# Patient Record
Sex: Male | Born: 1965 | Race: White
Health system: Western US, Academic
[De-identification: ages and names within clinical notes are randomized; demographics above are authoritative.]

## PROBLEM LIST (undated history)

## (undated) DIAGNOSIS — R0600 Dyspnea, unspecified: Secondary | ICD-10-CM

## (undated) DIAGNOSIS — I77810 Thoracic aortic ectasia: Secondary | ICD-10-CM

## (undated) DIAGNOSIS — E785 Hyperlipidemia, unspecified: Secondary | ICD-10-CM

## (undated) DIAGNOSIS — I2510 Atherosclerotic heart disease of native coronary artery without angina pectoris: Secondary | ICD-10-CM

## (undated) DIAGNOSIS — Z9889 Other specified postprocedural states: Secondary | ICD-10-CM

## (undated) DIAGNOSIS — R011 Cardiac murmur, unspecified: Secondary | ICD-10-CM

## (undated) DIAGNOSIS — R51 Headache: Secondary | ICD-10-CM

## (undated) DIAGNOSIS — I214 Non-ST elevation (NSTEMI) myocardial infarction: Secondary | ICD-10-CM

## (undated) DIAGNOSIS — G4733 Obstructive sleep apnea (adult) (pediatric): Secondary | ICD-10-CM

## (undated) DIAGNOSIS — I509 Heart failure, unspecified: Secondary | ICD-10-CM

## (undated) DIAGNOSIS — F329 Major depressive disorder, single episode, unspecified: Secondary | ICD-10-CM

## (undated) DIAGNOSIS — R519 Headache, unspecified: Secondary | ICD-10-CM

## (undated) DIAGNOSIS — K219 Gastro-esophageal reflux disease without esophagitis: Secondary | ICD-10-CM

## (undated) DIAGNOSIS — G4730 Sleep apnea, unspecified: Secondary | ICD-10-CM

## (undated) DIAGNOSIS — I351 Nonrheumatic aortic (valve) insufficiency: Secondary | ICD-10-CM

## (undated) DIAGNOSIS — I10 Essential (primary) hypertension: Secondary | ICD-10-CM

## (undated) HISTORY — PX: CHOLESTEATOMA EXCISION: SHX1345

## (undated) HISTORY — PX: WISDOM TOOTH EXTRACTION: SHX21

## (undated) HISTORY — DX: Nonrheumatic aortic (valve) insufficiency: I35.1

## (undated) HISTORY — DX: Major depressive disorder, single episode, unspecified: F32.9

## (undated) HISTORY — DX: Atherosclerotic heart disease of native coronary artery without angina pectoris: I25.10

## (undated) HISTORY — DX: Sleep apnea, unspecified: G47.30

## (undated) HISTORY — PX: TYMPANOSTOMY TUBE PLACEMENT: SHX32

## (undated) HISTORY — DX: Hyperlipidemia, unspecified: E78.5

## (undated) HISTORY — DX: Gastro-esophageal reflux disease without esophagitis: K21.9

## (undated) HISTORY — DX: Thoracic aortic ectasia: I77.810

## (undated) HISTORY — DX: Cardiac murmur, unspecified: R01.1

---
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
## 2005-04-02 ENCOUNTER — Encounter (HOSPITAL_COMMUNITY): Payer: Commercial Managed Care - PPO | Admitting: Orthopaedic Surgery

## 2005-04-02 ENCOUNTER — Ambulatory Visit (EMERGENCY_DEPARTMENT_HOSPITAL): Payer: No Typology Code available for payment source

## 2005-04-07 ENCOUNTER — Encounter: Payer: No Typology Code available for payment source | Admitting: Orthopaedic Surgery

## 2005-04-07 ENCOUNTER — Telehealth: Payer: No Typology Code available for payment source

## 2005-04-08 ENCOUNTER — Ambulatory Visit: Payer: No Typology Code available for payment source

## 2005-04-08 ENCOUNTER — Encounter (HOSPITAL_COMMUNITY): Payer: No Typology Code available for payment source | Admitting: Orthopaedic Surgery

## 2005-04-21 ENCOUNTER — Encounter: Payer: No Typology Code available for payment source | Admitting: Orthopaedic Surgery

## 2005-05-03 ENCOUNTER — Encounter: Payer: No Typology Code available for payment source | Admitting: Surgical

## 2005-05-04 ENCOUNTER — Encounter (HOSPITAL_BASED_OUTPATIENT_CLINIC_OR_DEPARTMENT_OTHER): Payer: No Typology Code available for payment source | Admitting: Psychologist

## 2005-05-11 ENCOUNTER — Encounter (HOSPITAL_BASED_OUTPATIENT_CLINIC_OR_DEPARTMENT_OTHER): Payer: No Typology Code available for payment source | Admitting: Psychologist

## 2005-05-11 ENCOUNTER — Encounter (HOSPITAL_BASED_OUTPATIENT_CLINIC_OR_DEPARTMENT_OTHER): Payer: No Typology Code available for payment source | Admitting: Physician Assistant

## 2005-05-11 ENCOUNTER — Encounter (HOSPITAL_BASED_OUTPATIENT_CLINIC_OR_DEPARTMENT_OTHER): Payer: No Typology Code available for payment source | Admitting: Rehabilitative and Restorative Service Providers"

## 2005-05-12 ENCOUNTER — Ambulatory Visit: Payer: No Typology Code available for payment source | Admitting: Rehabilitative and Restorative Service Providers"

## 2005-05-12 ENCOUNTER — Encounter: Payer: No Typology Code available for payment source | Admitting: Orthopaedic Surgery

## 2005-05-18 ENCOUNTER — Encounter (HOSPITAL_BASED_OUTPATIENT_CLINIC_OR_DEPARTMENT_OTHER): Payer: No Typology Code available for payment source | Admitting: Psychologist

## 2005-05-19 ENCOUNTER — Encounter: Payer: No Typology Code available for payment source | Admitting: Rehabilitative and Restorative Service Providers"

## 2005-05-24 ENCOUNTER — Encounter: Payer: No Typology Code available for payment source | Admitting: Rehabilitative and Restorative Service Providers"

## 2005-05-25 ENCOUNTER — Encounter (HOSPITAL_BASED_OUTPATIENT_CLINIC_OR_DEPARTMENT_OTHER): Payer: No Typology Code available for payment source | Admitting: Rehabilitative and Restorative Service Providers"

## 2005-05-25 ENCOUNTER — Encounter (HOSPITAL_BASED_OUTPATIENT_CLINIC_OR_DEPARTMENT_OTHER): Payer: No Typology Code available for payment source | Admitting: Psychologist

## 2005-05-25 ENCOUNTER — Encounter (HOSPITAL_BASED_OUTPATIENT_CLINIC_OR_DEPARTMENT_OTHER): Payer: Commercial Managed Care - PPO

## 2005-05-27 ENCOUNTER — Encounter: Payer: No Typology Code available for payment source | Admitting: Rehabilitative and Restorative Service Providers"

## 2005-05-31 ENCOUNTER — Encounter: Payer: No Typology Code available for payment source | Admitting: Rehabilitative and Restorative Service Providers"

## 2005-06-01 ENCOUNTER — Encounter (HOSPITAL_BASED_OUTPATIENT_CLINIC_OR_DEPARTMENT_OTHER): Payer: No Typology Code available for payment source | Admitting: Psychologist

## 2005-06-02 ENCOUNTER — Encounter: Payer: No Typology Code available for payment source | Admitting: Rehabilitative and Restorative Service Providers"

## 2005-06-03 ENCOUNTER — Encounter (HOSPITAL_BASED_OUTPATIENT_CLINIC_OR_DEPARTMENT_OTHER): Payer: No Typology Code available for payment source | Admitting: Rehabilitative and Restorative Service Providers"

## 2005-06-08 ENCOUNTER — Encounter (HOSPITAL_BASED_OUTPATIENT_CLINIC_OR_DEPARTMENT_OTHER): Payer: No Typology Code available for payment source | Admitting: Psychologist

## 2005-06-08 ENCOUNTER — Encounter (HOSPITAL_BASED_OUTPATIENT_CLINIC_OR_DEPARTMENT_OTHER): Payer: No Typology Code available for payment source | Admitting: Physician Assistant

## 2005-06-08 ENCOUNTER — Encounter: Payer: No Typology Code available for payment source | Admitting: Rehabilitative and Restorative Service Providers"

## 2005-06-10 ENCOUNTER — Encounter (HOSPITAL_BASED_OUTPATIENT_CLINIC_OR_DEPARTMENT_OTHER): Payer: No Typology Code available for payment source

## 2005-06-10 ENCOUNTER — Encounter (HOSPITAL_BASED_OUTPATIENT_CLINIC_OR_DEPARTMENT_OTHER): Payer: Commercial Managed Care - PPO | Admitting: Rehabilitative and Restorative Service Providers"

## 2005-06-10 ENCOUNTER — Encounter (HOSPITAL_BASED_OUTPATIENT_CLINIC_OR_DEPARTMENT_OTHER): Payer: No Typology Code available for payment source | Admitting: Rehabilitative and Restorative Service Providers"

## 2005-06-10 ENCOUNTER — Encounter: Payer: No Typology Code available for payment source | Admitting: Rehabilitative and Restorative Service Providers"

## 2005-06-15 ENCOUNTER — Encounter (HOSPITAL_BASED_OUTPATIENT_CLINIC_OR_DEPARTMENT_OTHER): Payer: Commercial Managed Care - PPO | Admitting: Rehabilitative and Restorative Service Providers"

## 2005-06-15 ENCOUNTER — Encounter (HOSPITAL_BASED_OUTPATIENT_CLINIC_OR_DEPARTMENT_OTHER): Payer: No Typology Code available for payment source | Admitting: Psychologist

## 2005-06-15 ENCOUNTER — Encounter (HOSPITAL_BASED_OUTPATIENT_CLINIC_OR_DEPARTMENT_OTHER): Payer: Commercial Managed Care - PPO

## 2005-06-17 ENCOUNTER — Encounter (HOSPITAL_BASED_OUTPATIENT_CLINIC_OR_DEPARTMENT_OTHER): Payer: Commercial Managed Care - PPO

## 2005-06-22 ENCOUNTER — Encounter (HOSPITAL_BASED_OUTPATIENT_CLINIC_OR_DEPARTMENT_OTHER): Payer: No Typology Code available for payment source | Admitting: Psychologist

## 2005-06-22 ENCOUNTER — Encounter (HOSPITAL_BASED_OUTPATIENT_CLINIC_OR_DEPARTMENT_OTHER): Payer: No Typology Code available for payment source

## 2005-06-23 ENCOUNTER — Encounter: Payer: No Typology Code available for payment source | Admitting: Orthopaedic Surgery

## 2005-06-24 ENCOUNTER — Encounter (HOSPITAL_BASED_OUTPATIENT_CLINIC_OR_DEPARTMENT_OTHER): Payer: Commercial Managed Care - PPO

## 2005-06-29 ENCOUNTER — Encounter (HOSPITAL_BASED_OUTPATIENT_CLINIC_OR_DEPARTMENT_OTHER): Payer: No Typology Code available for payment source

## 2005-06-29 ENCOUNTER — Encounter (HOSPITAL_BASED_OUTPATIENT_CLINIC_OR_DEPARTMENT_OTHER): Payer: No Typology Code available for payment source | Admitting: Psychologist

## 2005-07-01 ENCOUNTER — Encounter (HOSPITAL_BASED_OUTPATIENT_CLINIC_OR_DEPARTMENT_OTHER): Payer: No Typology Code available for payment source

## 2005-07-06 ENCOUNTER — Encounter (HOSPITAL_BASED_OUTPATIENT_CLINIC_OR_DEPARTMENT_OTHER): Payer: No Typology Code available for payment source

## 2005-07-06 ENCOUNTER — Encounter (HOSPITAL_BASED_OUTPATIENT_CLINIC_OR_DEPARTMENT_OTHER): Payer: No Typology Code available for payment source | Admitting: Psychologist

## 2005-07-08 ENCOUNTER — Encounter (HOSPITAL_BASED_OUTPATIENT_CLINIC_OR_DEPARTMENT_OTHER): Payer: No Typology Code available for payment source

## 2005-07-13 ENCOUNTER — Encounter (HOSPITAL_BASED_OUTPATIENT_CLINIC_OR_DEPARTMENT_OTHER): Payer: Commercial Managed Care - PPO

## 2005-07-13 ENCOUNTER — Encounter (HOSPITAL_BASED_OUTPATIENT_CLINIC_OR_DEPARTMENT_OTHER): Payer: No Typology Code available for payment source | Admitting: Psychologist

## 2005-07-13 ENCOUNTER — Encounter (HOSPITAL_BASED_OUTPATIENT_CLINIC_OR_DEPARTMENT_OTHER): Payer: No Typology Code available for payment source | Admitting: Orthopaedic Surgery

## 2005-07-15 ENCOUNTER — Encounter (HOSPITAL_BASED_OUTPATIENT_CLINIC_OR_DEPARTMENT_OTHER): Payer: Self-pay

## 2005-07-20 ENCOUNTER — Encounter (HOSPITAL_BASED_OUTPATIENT_CLINIC_OR_DEPARTMENT_OTHER): Payer: No Typology Code available for payment source

## 2005-07-20 ENCOUNTER — Encounter (HOSPITAL_BASED_OUTPATIENT_CLINIC_OR_DEPARTMENT_OTHER): Payer: No Typology Code available for payment source | Admitting: Physician Assistant

## 2005-07-20 ENCOUNTER — Encounter (HOSPITAL_BASED_OUTPATIENT_CLINIC_OR_DEPARTMENT_OTHER): Payer: No Typology Code available for payment source | Admitting: Psychologist

## 2005-07-27 ENCOUNTER — Encounter (HOSPITAL_BASED_OUTPATIENT_CLINIC_OR_DEPARTMENT_OTHER): Payer: No Typology Code available for payment source

## 2005-07-27 ENCOUNTER — Encounter (HOSPITAL_BASED_OUTPATIENT_CLINIC_OR_DEPARTMENT_OTHER): Payer: Commercial Managed Care - PPO

## 2005-07-27 ENCOUNTER — Encounter (HOSPITAL_BASED_OUTPATIENT_CLINIC_OR_DEPARTMENT_OTHER): Payer: No Typology Code available for payment source | Admitting: Orthopaedic Surgery

## 2005-07-27 ENCOUNTER — Encounter (HOSPITAL_BASED_OUTPATIENT_CLINIC_OR_DEPARTMENT_OTHER): Payer: No Typology Code available for payment source | Admitting: Psychologist

## 2005-07-29 ENCOUNTER — Encounter (HOSPITAL_BASED_OUTPATIENT_CLINIC_OR_DEPARTMENT_OTHER): Payer: Commercial Managed Care - PPO

## 2005-08-03 ENCOUNTER — Encounter (HOSPITAL_BASED_OUTPATIENT_CLINIC_OR_DEPARTMENT_OTHER): Payer: No Typology Code available for payment source | Admitting: Psychologist

## 2005-08-04 ENCOUNTER — Encounter (HOSPITAL_BASED_OUTPATIENT_CLINIC_OR_DEPARTMENT_OTHER): Payer: No Typology Code available for payment source

## 2005-08-10 ENCOUNTER — Encounter (HOSPITAL_BASED_OUTPATIENT_CLINIC_OR_DEPARTMENT_OTHER): Payer: No Typology Code available for payment source

## 2005-08-10 ENCOUNTER — Encounter (HOSPITAL_BASED_OUTPATIENT_CLINIC_OR_DEPARTMENT_OTHER): Payer: No Typology Code available for payment source | Admitting: Psychologist

## 2005-08-10 ENCOUNTER — Encounter (HOSPITAL_BASED_OUTPATIENT_CLINIC_OR_DEPARTMENT_OTHER): Payer: No Typology Code available for payment source | Admitting: Orthopaedic Surgery

## 2005-08-13 ENCOUNTER — Encounter (HOSPITAL_BASED_OUTPATIENT_CLINIC_OR_DEPARTMENT_OTHER): Payer: No Typology Code available for payment source | Admitting: Rehabilitative and Restorative Service Providers"

## 2005-08-17 ENCOUNTER — Encounter (HOSPITAL_BASED_OUTPATIENT_CLINIC_OR_DEPARTMENT_OTHER): Payer: No Typology Code available for payment source | Admitting: Psychologist

## 2005-08-17 ENCOUNTER — Encounter (HOSPITAL_BASED_OUTPATIENT_CLINIC_OR_DEPARTMENT_OTHER): Payer: Self-pay

## 2005-08-17 ENCOUNTER — Encounter (HOSPITAL_BASED_OUTPATIENT_CLINIC_OR_DEPARTMENT_OTHER): Payer: Commercial Managed Care - PPO

## 2005-08-24 ENCOUNTER — Encounter (HOSPITAL_BASED_OUTPATIENT_CLINIC_OR_DEPARTMENT_OTHER): Payer: No Typology Code available for payment source

## 2005-08-24 ENCOUNTER — Encounter (HOSPITAL_BASED_OUTPATIENT_CLINIC_OR_DEPARTMENT_OTHER): Payer: Self-pay

## 2005-08-24 ENCOUNTER — Encounter (HOSPITAL_BASED_OUTPATIENT_CLINIC_OR_DEPARTMENT_OTHER): Payer: No Typology Code available for payment source | Admitting: Psychologist

## 2005-08-31 ENCOUNTER — Encounter (HOSPITAL_BASED_OUTPATIENT_CLINIC_OR_DEPARTMENT_OTHER): Payer: No Typology Code available for payment source | Admitting: Psychologist

## 2005-08-31 ENCOUNTER — Encounter (HOSPITAL_BASED_OUTPATIENT_CLINIC_OR_DEPARTMENT_OTHER): Payer: Commercial Managed Care - PPO | Admitting: Physician Assistant

## 2005-09-01 ENCOUNTER — Encounter (HOSPITAL_BASED_OUTPATIENT_CLINIC_OR_DEPARTMENT_OTHER): Payer: Self-pay

## 2005-09-07 ENCOUNTER — Encounter (HOSPITAL_BASED_OUTPATIENT_CLINIC_OR_DEPARTMENT_OTHER): Payer: No Typology Code available for payment source | Admitting: Physical Medicine & Rehabilitation

## 2005-09-07 ENCOUNTER — Encounter (HOSPITAL_BASED_OUTPATIENT_CLINIC_OR_DEPARTMENT_OTHER): Payer: No Typology Code available for payment source | Admitting: Psychologist

## 2005-09-07 ENCOUNTER — Encounter (HOSPITAL_BASED_OUTPATIENT_CLINIC_OR_DEPARTMENT_OTHER): Payer: No Typology Code available for payment source

## 2005-09-14 ENCOUNTER — Encounter (HOSPITAL_BASED_OUTPATIENT_CLINIC_OR_DEPARTMENT_OTHER): Payer: No Typology Code available for payment source | Admitting: Psychologist

## 2005-09-14 ENCOUNTER — Encounter (HOSPITAL_BASED_OUTPATIENT_CLINIC_OR_DEPARTMENT_OTHER): Payer: Commercial Managed Care - PPO

## 2005-09-21 ENCOUNTER — Encounter (HOSPITAL_BASED_OUTPATIENT_CLINIC_OR_DEPARTMENT_OTHER): Payer: No Typology Code available for payment source | Admitting: Psychologist

## 2005-10-19 ENCOUNTER — Encounter (HOSPITAL_BASED_OUTPATIENT_CLINIC_OR_DEPARTMENT_OTHER): Payer: No Typology Code available for payment source | Admitting: Physician Assistant

## 2005-10-19 ENCOUNTER — Encounter (HOSPITAL_BASED_OUTPATIENT_CLINIC_OR_DEPARTMENT_OTHER): Payer: No Typology Code available for payment source | Admitting: Rehabilitative and Restorative Service Providers"

## 2005-11-09 ENCOUNTER — Encounter (HOSPITAL_BASED_OUTPATIENT_CLINIC_OR_DEPARTMENT_OTHER): Payer: No Typology Code available for payment source | Admitting: Physical Medicine & Rehabilitation

## 2005-11-09 ENCOUNTER — Encounter (HOSPITAL_BASED_OUTPATIENT_CLINIC_OR_DEPARTMENT_OTHER): Payer: Commercial Managed Care - PPO | Admitting: Rehabilitative and Restorative Service Providers"

## 2005-11-23 ENCOUNTER — Encounter (HOSPITAL_BASED_OUTPATIENT_CLINIC_OR_DEPARTMENT_OTHER): Payer: Commercial Managed Care - PPO | Admitting: Rehabilitative and Restorative Service Providers"

## 2005-11-30 ENCOUNTER — Encounter (HOSPITAL_BASED_OUTPATIENT_CLINIC_OR_DEPARTMENT_OTHER): Payer: Commercial Managed Care - PPO | Admitting: Rehabilitative and Restorative Service Providers"

## 2005-12-07 ENCOUNTER — Encounter (HOSPITAL_BASED_OUTPATIENT_CLINIC_OR_DEPARTMENT_OTHER): Payer: Commercial Managed Care - PPO | Admitting: Rehabilitative and Restorative Service Providers"

## 2005-12-14 ENCOUNTER — Encounter (HOSPITAL_BASED_OUTPATIENT_CLINIC_OR_DEPARTMENT_OTHER): Payer: Commercial Managed Care - PPO | Admitting: Rehabilitative and Restorative Service Providers"

## 2005-12-21 ENCOUNTER — Encounter (HOSPITAL_BASED_OUTPATIENT_CLINIC_OR_DEPARTMENT_OTHER): Payer: Commercial Managed Care - PPO | Admitting: Rehabilitative and Restorative Service Providers"

## 2005-12-28 ENCOUNTER — Encounter (HOSPITAL_BASED_OUTPATIENT_CLINIC_OR_DEPARTMENT_OTHER): Payer: No Typology Code available for payment source | Admitting: Physical Medicine & Rehabilitation

## 2005-12-28 ENCOUNTER — Encounter (HOSPITAL_BASED_OUTPATIENT_CLINIC_OR_DEPARTMENT_OTHER): Payer: Commercial Managed Care - PPO | Admitting: Rehabilitative and Restorative Service Providers"

## 2005-12-29 ENCOUNTER — Encounter (HOSPITAL_BASED_OUTPATIENT_CLINIC_OR_DEPARTMENT_OTHER): Payer: Commercial Managed Care - PPO | Admitting: Rehabilitative and Restorative Service Providers"

## 2006-01-04 ENCOUNTER — Encounter (HOSPITAL_BASED_OUTPATIENT_CLINIC_OR_DEPARTMENT_OTHER): Payer: No Typology Code available for payment source | Admitting: Rehabilitative and Restorative Service Providers"

## 2006-01-05 ENCOUNTER — Encounter (HOSPITAL_BASED_OUTPATIENT_CLINIC_OR_DEPARTMENT_OTHER): Payer: No Typology Code available for payment source | Admitting: Clinical

## 2006-01-11 ENCOUNTER — Encounter (HOSPITAL_BASED_OUTPATIENT_CLINIC_OR_DEPARTMENT_OTHER): Payer: No Typology Code available for payment source | Admitting: Rehabilitative and Restorative Service Providers"

## 2006-01-18 ENCOUNTER — Encounter (HOSPITAL_BASED_OUTPATIENT_CLINIC_OR_DEPARTMENT_OTHER): Payer: No Typology Code available for payment source | Admitting: Rehabilitative and Restorative Service Providers"

## 2006-01-18 ENCOUNTER — Encounter (HOSPITAL_BASED_OUTPATIENT_CLINIC_OR_DEPARTMENT_OTHER): Payer: No Typology Code available for payment source | Admitting: Clinical

## 2006-01-18 ENCOUNTER — Encounter (HOSPITAL_BASED_OUTPATIENT_CLINIC_OR_DEPARTMENT_OTHER): Payer: Commercial Managed Care - PPO | Admitting: Rehabilitative and Restorative Service Providers"

## 2006-01-26 ENCOUNTER — Encounter (HOSPITAL_BASED_OUTPATIENT_CLINIC_OR_DEPARTMENT_OTHER): Payer: No Typology Code available for payment source | Admitting: Clinical

## 2006-02-02 ENCOUNTER — Encounter (HOSPITAL_BASED_OUTPATIENT_CLINIC_OR_DEPARTMENT_OTHER): Payer: No Typology Code available for payment source | Admitting: Clinical

## 2006-02-09 ENCOUNTER — Encounter (HOSPITAL_BASED_OUTPATIENT_CLINIC_OR_DEPARTMENT_OTHER): Payer: No Typology Code available for payment source | Admitting: Clinical

## 2006-02-16 ENCOUNTER — Encounter (HOSPITAL_BASED_OUTPATIENT_CLINIC_OR_DEPARTMENT_OTHER): Payer: No Typology Code available for payment source | Admitting: Clinical

## 2006-02-23 ENCOUNTER — Encounter (HOSPITAL_BASED_OUTPATIENT_CLINIC_OR_DEPARTMENT_OTHER): Payer: Commercial Managed Care - PPO | Admitting: Clinical

## 2006-03-08 ENCOUNTER — Encounter (HOSPITAL_BASED_OUTPATIENT_CLINIC_OR_DEPARTMENT_OTHER): Payer: No Typology Code available for payment source | Admitting: Physical Medicine & Rehabilitation

## 2006-03-08 ENCOUNTER — Encounter (HOSPITAL_BASED_OUTPATIENT_CLINIC_OR_DEPARTMENT_OTHER): Payer: PRIVATE HEALTH INSURANCE

## 2006-06-14 ENCOUNTER — Encounter (HOSPITAL_BASED_OUTPATIENT_CLINIC_OR_DEPARTMENT_OTHER): Payer: No Typology Code available for payment source | Admitting: Physical Medicine & Rehabilitation

## 2006-12-13 ENCOUNTER — Encounter (HOSPITAL_BASED_OUTPATIENT_CLINIC_OR_DEPARTMENT_OTHER): Payer: No Typology Code available for payment source | Admitting: Physical Medicine & Rehabilitation

## 2007-01-24 ENCOUNTER — Encounter (HOSPITAL_BASED_OUTPATIENT_CLINIC_OR_DEPARTMENT_OTHER): Payer: No Typology Code available for payment source | Admitting: Rehabilitative and Restorative Service Providers"

## 2007-01-31 ENCOUNTER — Encounter (HOSPITAL_BASED_OUTPATIENT_CLINIC_OR_DEPARTMENT_OTHER): Payer: No Typology Code available for payment source | Admitting: Rehabilitative and Restorative Service Providers"

## 2007-02-03 ENCOUNTER — Encounter (HOSPITAL_BASED_OUTPATIENT_CLINIC_OR_DEPARTMENT_OTHER): Payer: No Typology Code available for payment source | Admitting: Rehabilitative and Restorative Service Providers"

## 2007-02-14 ENCOUNTER — Encounter (HOSPITAL_BASED_OUTPATIENT_CLINIC_OR_DEPARTMENT_OTHER): Payer: No Typology Code available for payment source | Admitting: Rehabilitative and Restorative Service Providers"

## 2007-02-17 ENCOUNTER — Encounter (HOSPITAL_BASED_OUTPATIENT_CLINIC_OR_DEPARTMENT_OTHER): Payer: No Typology Code available for payment source | Admitting: Rehabilitative and Restorative Service Providers"

## 2007-02-21 ENCOUNTER — Encounter (HOSPITAL_BASED_OUTPATIENT_CLINIC_OR_DEPARTMENT_OTHER): Payer: No Typology Code available for payment source | Admitting: Rehabilitative and Restorative Service Providers"

## 2007-02-28 ENCOUNTER — Encounter (HOSPITAL_BASED_OUTPATIENT_CLINIC_OR_DEPARTMENT_OTHER): Payer: PRIVATE HEALTH INSURANCE | Admitting: Rehabilitative and Restorative Service Providers"

## 2007-03-03 ENCOUNTER — Encounter (HOSPITAL_BASED_OUTPATIENT_CLINIC_OR_DEPARTMENT_OTHER): Payer: PRIVATE HEALTH INSURANCE | Admitting: Rehabilitative and Restorative Service Providers"

## 2007-03-07 ENCOUNTER — Encounter (HOSPITAL_BASED_OUTPATIENT_CLINIC_OR_DEPARTMENT_OTHER): Payer: PRIVATE HEALTH INSURANCE | Admitting: Rehabilitative and Restorative Service Providers"

## 2007-03-10 ENCOUNTER — Encounter (HOSPITAL_BASED_OUTPATIENT_CLINIC_OR_DEPARTMENT_OTHER): Payer: PRIVATE HEALTH INSURANCE | Admitting: Rehabilitative and Restorative Service Providers"

## 2007-03-15 ENCOUNTER — Encounter (HOSPITAL_BASED_OUTPATIENT_CLINIC_OR_DEPARTMENT_OTHER): Payer: No Typology Code available for payment source | Admitting: Rehabilitative and Restorative Service Providers"

## 2007-03-16 ENCOUNTER — Encounter (HOSPITAL_BASED_OUTPATIENT_CLINIC_OR_DEPARTMENT_OTHER): Payer: PRIVATE HEALTH INSURANCE | Admitting: Rehabilitative and Restorative Service Providers"

## 2007-03-30 HISTORY — PX: ARM AMPUTATION THROUGH FOREARM: SHX553

## 2007-05-30 ENCOUNTER — Encounter (HOSPITAL_BASED_OUTPATIENT_CLINIC_OR_DEPARTMENT_OTHER): Payer: No Typology Code available for payment source | Admitting: Physical Med & Rehab

## 2007-06-16 ENCOUNTER — Encounter (HOSPITAL_BASED_OUTPATIENT_CLINIC_OR_DEPARTMENT_OTHER): Payer: PRIVATE HEALTH INSURANCE | Admitting: Rehabilitative and Restorative Service Providers"

## 2007-06-23 ENCOUNTER — Encounter (HOSPITAL_BASED_OUTPATIENT_CLINIC_OR_DEPARTMENT_OTHER): Payer: PRIVATE HEALTH INSURANCE | Admitting: Rehabilitative and Restorative Service Providers"

## 2007-06-27 ENCOUNTER — Encounter (HOSPITAL_BASED_OUTPATIENT_CLINIC_OR_DEPARTMENT_OTHER): Payer: No Typology Code available for payment source | Admitting: Orthopaedic Surgery

## 2007-06-30 ENCOUNTER — Encounter (HOSPITAL_BASED_OUTPATIENT_CLINIC_OR_DEPARTMENT_OTHER): Payer: No Typology Code available for payment source | Admitting: Rehabilitative and Restorative Service Providers"

## 2007-07-07 ENCOUNTER — Encounter (HOSPITAL_BASED_OUTPATIENT_CLINIC_OR_DEPARTMENT_OTHER): Payer: No Typology Code available for payment source | Admitting: Rehabilitative and Restorative Service Providers"

## 2007-07-14 ENCOUNTER — Encounter (HOSPITAL_BASED_OUTPATIENT_CLINIC_OR_DEPARTMENT_OTHER): Payer: No Typology Code available for payment source | Admitting: Rehabilitative and Restorative Service Providers"

## 2007-07-18 ENCOUNTER — Encounter (HOSPITAL_BASED_OUTPATIENT_CLINIC_OR_DEPARTMENT_OTHER): Payer: No Typology Code available for payment source | Admitting: Rehabilitative and Restorative Service Providers"

## 2007-07-25 ENCOUNTER — Encounter (HOSPITAL_BASED_OUTPATIENT_CLINIC_OR_DEPARTMENT_OTHER): Payer: No Typology Code available for payment source | Admitting: Rehabilitative and Restorative Service Providers"

## 2007-08-01 ENCOUNTER — Encounter (HOSPITAL_BASED_OUTPATIENT_CLINIC_OR_DEPARTMENT_OTHER): Payer: No Typology Code available for payment source | Admitting: Rehabilitative and Restorative Service Providers"

## 2007-09-19 ENCOUNTER — Encounter (HOSPITAL_BASED_OUTPATIENT_CLINIC_OR_DEPARTMENT_OTHER): Payer: PRIVATE HEALTH INSURANCE | Admitting: Orthopaedic Surgery

## 2007-09-19 ENCOUNTER — Encounter (HOSPITAL_BASED_OUTPATIENT_CLINIC_OR_DEPARTMENT_OTHER): Payer: No Typology Code available for payment source | Admitting: Physical Medicine & Rehabilitation

## 2007-10-12 ENCOUNTER — Encounter (HOSPITAL_BASED_OUTPATIENT_CLINIC_OR_DEPARTMENT_OTHER): Payer: PRIVATE HEALTH INSURANCE | Admitting: Rehabilitative and Restorative Service Providers"

## 2007-10-18 ENCOUNTER — Encounter (HOSPITAL_BASED_OUTPATIENT_CLINIC_OR_DEPARTMENT_OTHER): Payer: PRIVATE HEALTH INSURANCE | Admitting: Rehabilitative and Restorative Service Providers"

## 2007-10-25 ENCOUNTER — Encounter (HOSPITAL_BASED_OUTPATIENT_CLINIC_OR_DEPARTMENT_OTHER): Payer: PRIVATE HEALTH INSURANCE | Admitting: Rehabilitative and Restorative Service Providers"

## 2007-11-01 ENCOUNTER — Encounter (HOSPITAL_BASED_OUTPATIENT_CLINIC_OR_DEPARTMENT_OTHER): Payer: PRIVATE HEALTH INSURANCE | Admitting: Rehabilitative and Restorative Service Providers"

## 2007-11-08 ENCOUNTER — Encounter (HOSPITAL_BASED_OUTPATIENT_CLINIC_OR_DEPARTMENT_OTHER): Payer: No Typology Code available for payment source | Admitting: Rehabilitative and Restorative Service Providers"

## 2008-02-27 ENCOUNTER — Encounter (HOSPITAL_BASED_OUTPATIENT_CLINIC_OR_DEPARTMENT_OTHER): Payer: No Typology Code available for payment source | Admitting: Physical Medicine & Rehabilitation

## 2008-06-04 ENCOUNTER — Encounter (HOSPITAL_BASED_OUTPATIENT_CLINIC_OR_DEPARTMENT_OTHER): Payer: PRIVATE HEALTH INSURANCE | Admitting: Physical Medicine & Rehabilitation

## 2008-06-28 ENCOUNTER — Ambulatory Visit (HOSPITAL_BASED_OUTPATIENT_CLINIC_OR_DEPARTMENT_OTHER): Payer: Self-pay

## 2008-12-31 ENCOUNTER — Ambulatory Visit (HOSPITAL_BASED_OUTPATIENT_CLINIC_OR_DEPARTMENT_OTHER): Payer: Self-pay

## 2009-01-28 ENCOUNTER — Ambulatory Visit (HOSPITAL_BASED_OUTPATIENT_CLINIC_OR_DEPARTMENT_OTHER): Payer: Self-pay

## 2009-05-27 ENCOUNTER — Ambulatory Visit (HOSPITAL_BASED_OUTPATIENT_CLINIC_OR_DEPARTMENT_OTHER)
Payer: No Typology Code available for payment source | Attending: Physical Medicine & Rehabilitation | Admitting: Physical Medicine & Rehabilitation

## 2009-05-27 DIAGNOSIS — B369 Superficial mycosis, unspecified: Secondary | ICD-10-CM | POA: Insufficient documentation

## 2009-05-27 DIAGNOSIS — M25519 Pain in unspecified shoulder: Secondary | ICD-10-CM | POA: Insufficient documentation

## 2010-01-20 ENCOUNTER — Ambulatory Visit (HOSPITAL_BASED_OUTPATIENT_CLINIC_OR_DEPARTMENT_OTHER): Payer: Self-pay

## 2011-02-17 ENCOUNTER — Ambulatory Visit (HOSPITAL_BASED_OUTPATIENT_CLINIC_OR_DEPARTMENT_OTHER): Payer: Self-pay

## 2012-02-08 ENCOUNTER — Ambulatory Visit (HOSPITAL_BASED_OUTPATIENT_CLINIC_OR_DEPARTMENT_OTHER): Payer: Self-pay

## 2014-01-21 ENCOUNTER — Telehealth (HOSPITAL_BASED_OUTPATIENT_CLINIC_OR_DEPARTMENT_OTHER): Payer: Self-pay | Admitting: Physical Medicine & Rehabilitation

## 2014-01-21 NOTE — Telephone Encounter (Signed)
Call and spoke with patient and he was wanting to get a prescription for a prosthetic.  However there are issues.    1.  He lives in [place] now and will need to establish care with a provider to get a script.   He was appreciative of the call back, but wanted to try anyway.

## 2014-01-21 NOTE — Telephone Encounter (Signed)
CONFIRMED PHONE NUMBER: [phone]  CALLERS FIRST AND LAST NAME: Angela NevinKenneth Facemire  FACILITY NAME: n/a TITLE: n/  CALLERS RELATIONSHIP:Self  RETURN CALL: OK to leave detailed message with his wife     SUBJECT: General Message   REASON FOR REQUEST: Patient from 2011 requesting a prosthetic device order    MESSAGE: The patient needs a replacement wrist for his otto bock sensor hand. He was wondering if the doctor would be able to write the prescription. He would like to work with [facility] of [facility]. Their fax is [phone].    Please call the patient back to let him know if you can help him or not. Thank you.

## 2014-02-02 ENCOUNTER — Ambulatory Visit (INDEPENDENT_AMBULATORY_CARE_PROVIDER_SITE_OTHER): Payer: 59 | Admitting: Emergency Medicine

## 2014-02-02 ENCOUNTER — Ambulatory Visit (INDEPENDENT_AMBULATORY_CARE_PROVIDER_SITE_OTHER): Payer: 59

## 2014-02-02 VITALS — BP 132/86 | HR 74 | Temp 98.3°F | Resp 16 | Ht 71.25 in | Wt 188.4 lb

## 2014-02-02 DIAGNOSIS — S20211A Contusion of right front wall of thorax, initial encounter: Secondary | ICD-10-CM

## 2014-02-02 LAB — POCT CBC
GRANULOCYTE PERCENT: 70 % (ref 37–80)
HCT, POC: 47.9 % (ref 43.5–53.7)
Hemoglobin: 15.8 g/dL (ref 14.1–18.1)
Lymph, poc: 2.1 (ref 0.6–3.4)
MCH, POC: 32.4 pg — AB (ref 27–31.2)
MCHC: 33 g/dL (ref 31.8–35.4)
MCV: 98.2 fL — AB (ref 80–97)
MID (CBC): 0.3 (ref 0–0.9)
MPV: 6.5 fL (ref 0–99.8)
POC GRANULOCYTE: 5.5 (ref 2–6.9)
POC LYMPH %: 26.8 % (ref 10–50)
POC MID %: 3.2 % (ref 0–12)
Platelet Count, POC: 257 10*3/uL (ref 142–424)
RBC: 4.88 M/uL (ref 4.69–6.13)
RDW, POC: 13.3 %
WBC: 7.9 10*3/uL (ref 4.6–10.2)

## 2014-02-02 LAB — POCT UA - MICROSCOPIC ONLY
Bacteria, U Microscopic: NEGATIVE
CRYSTALS, UR, HPF, POC: NEGATIVE
Casts, Ur, LPF, POC: NEGATIVE
EPITHELIAL CELLS, URINE PER MICROSCOPY: NEGATIVE
MUCUS UA: NEGATIVE
WBC, Ur, HPF, POC: NEGATIVE
Yeast, UA: NEGATIVE

## 2014-02-02 LAB — POCT URINALYSIS DIPSTICK
Bilirubin, UA: NEGATIVE
Glucose, UA: NEGATIVE
KETONES UA: NEGATIVE
Leukocytes, UA: NEGATIVE
Nitrite, UA: NEGATIVE
PH UA: 7
PROTEIN UA: NEGATIVE
RBC UA: NEGATIVE
Spec Grav, UA: 1.02
Urobilinogen, UA: 0.2

## 2014-02-02 MED ORDER — ACETAMINOPHEN-CODEINE #3 300-30 MG PO TABS: 1.0000 | ORAL_TABLET | ORAL | Status: DC | PRN

## 2014-02-02 NOTE — Patient Instructions (Signed)

## 2014-02-02 NOTE — Progress Notes (Signed)
[facility] [address] [place] [address] [phone]  Date:  02/02/2014   Name:  Isaac Patel   DOB:  1965/09/20   MRN:  [id number]  PCP:  No PCP Per Patient    Chief Complaint: Back Pain; Establish Care; and Prescription   History of Present Illness:  Isaac Patel is a 48 y.o. very pleasant male patient who presents with the following:  Injured when he slipped on a stairway two weeks ago and landed on his right back.   No neuro symptoms.   No radiation of pain No shortness of breath, nausea or vomiting, coughing blood  No blood in urine. No stool change.  No blood in stools.   No improvement with over the counter medications or other home remedies.  Denies other complaint or health concern today.   There are no active problems to display for this patient.   Past Medical History  Diagnosis Date  . Depression   . GERD (gastroesophageal reflux disease)     Past Surgical History  Procedure Laterality Date  . Amputation  Left     History  Substance Use Topics  . Smoking status: Current Every Day Smoker -- 1.00 packs/day for 30 years    Types: Cigarettes  . Smokeless tobacco: Not on file  . Alcohol Use: 1.2 - 1.8 oz/week    2-3 Not specified per week    History reviewed. No pertinent family history.  No Known Allergies  Medication list has been reviewed and updated.  No current outpatient prescriptions on file prior to visit.   No current facility-administered medications on file prior to visit.    Review of Systems:  As per HPI, otherwise negative.    Physical Examination: Filed Vitals:   02/02/14 1358  BP: 132/86  Pulse: 74  Temp: 98.3 F (36.8 C)  Resp: 16   Filed Vitals:   02/02/14 1358  Height: 5' 11.25" (1.81 m)  Weight: 188 lb 6.6 oz (85.462 kg)   Body mass index is 26.09 kg/(m^2). Ideal Body Weight: Weight in (lb) to have BMI = 25: 180.1  GEN: WDWN, NAD, Non-toxic, A & O x 3 HEENT: Atraumatic,  Normocephalic. Neck supple. No masses, No LAD. Ears and Nose: No external deformity. CV: RRR, No M/G/R. No JVD. No thrill. No extra heart sounds. PULM: CTA B, no wheezes, crackles, rhonchi. No retractions. No resp. distress. No accessory muscle use. Tender right posterior chest wall. No crepitus or flail. No ecchymosis ABD: S, NT, ND, +BS. No rebound. No HSM. EXTR: No c/c/e  Left forearm amputation NEURO Normal gait.  PSYCH: Normally interactive. Conversant. Not depressed or anxious appearing.  Calm demeanor.    Assessment and Plan: Contusion chest wall tyl #3  Signed,  Ellison Carwin, MD   Results for orders placed or performed in visit on 02/02/14  POCT UA - Microscopic Only  Result Value Ref Range   WBC, Ur, HPF, POC neg    RBC, urine, microscopic 0-1    Bacteria, U Microscopic neg    Mucus, UA neg    Epithelial cells, urine per micros neg    Crystals, Ur, HPF, POC neg    Casts, Ur, LPF, POC neg    Yeast, UA neg   POCT urinalysis dipstick  Result Value Ref Range   Color, UA dark yellow    Clarity, UA clear    Glucose, UA neg    Bilirubin, UA neg    Ketones, UA neg  Spec Grav, UA 1.020    Blood, UA neg    pH, UA 7.0    Protein, UA neg    Urobilinogen, UA 0.2    Nitrite, UA neg    Leukocytes, UA Negative   POCT CBC  Result Value Ref Range   WBC 7.9 4.6 - 10.2 K/uL   Lymph, poc 2.1 0.6 - 3.4   POC LYMPH PERCENT 26.8 10 - 50 %L   MID (cbc) 0.3 0 - 0.9   POC MID % 3.2 0 - 12 %M   POC Granulocyte 5.5 2 - 6.9   Granulocyte percent 70.0 37 - 80 %G   RBC 4.88 4.69 - 6.13 M/uL   Hemoglobin 15.8 14.1 - 18.1 g/dL   HCT, POC 47.9 43.5 - 53.7 %   MCV 98.2 (A) 80 - 97 fL   MCH, POC 32.4 (A) 27 - 31.2 pg   MCHC 33.0 31.8 - 35.4 g/dL   RDW, POC 13.3 %   Platelet Count, POC 257 142 - 424 K/uL   MPV 6.5 0 - 99.8 fL     [facility] reading (PRIMARY) by  Dr. Ouida Sills.  No pneumo or hemothorax.

## 2014-05-08 ENCOUNTER — Encounter: Payer: Self-pay | Admitting: Family

## 2014-05-08 ENCOUNTER — Ambulatory Visit (INDEPENDENT_AMBULATORY_CARE_PROVIDER_SITE_OTHER): Payer: 59 | Admitting: Family

## 2014-05-08 VITALS — BP 132/92 | HR 61 | Temp 98.0°F | Resp 18 | Ht 71.0 in | Wt 190.8 lb

## 2014-05-08 DIAGNOSIS — H7190 Unspecified cholesteatoma, unspecified ear: Secondary | ICD-10-CM | POA: Insufficient documentation

## 2014-05-08 DIAGNOSIS — H7191 Unspecified cholesteatoma, right ear: Secondary | ICD-10-CM

## 2014-05-08 DIAGNOSIS — G4733 Obstructive sleep apnea (adult) (pediatric): Secondary | ICD-10-CM | POA: Insufficient documentation

## 2014-05-08 DIAGNOSIS — G4730 Sleep apnea, unspecified: Secondary | ICD-10-CM

## 2014-05-08 MED ORDER — PANTOPRAZOLE SODIUM 40 MG PO TBEC: 40.0000 mg | DELAYED_RELEASE_TABLET | Freq: Every day | ORAL | Status: DC

## 2014-05-08 NOTE — Assessment & Plan Note (Signed)
Previously diagnosed approximately 2 years ago in [place]. With current equipment not working, refer to pulmonology and sleep medicine for further management.

## 2014-05-08 NOTE — Progress Notes (Signed)
Pre visit review using our clinic review tool, if applicable. No additional management support is needed unless otherwise documented below in the visit note. 

## 2014-05-08 NOTE — Patient Instructions (Addendum)
Thank you for choosing [facility].  Summary/Instructions:  Please schedule a time for your physical.  Your prescription(s) have been submitted to your pharmacy or been printed and provided for you. Please take as directed and contact our office if you believe you are having problem(s) with the medication(s) or have any questions.  Referrals have been made during this visit. You should expect to hear back from our schedulers in about 10-14 days in regards to establishing an appointment with the specialists we discussed.   If your symptoms worsen or fail to improve, please contact our office for further instruction, or in case of emergency go directly to the emergency room at the closest medical facility.

## 2014-05-08 NOTE — Assessment & Plan Note (Signed)
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 Currently blocked by cerumen. Patient requesting referral to ear nose and throat for further evaluation and potential for reconstructive surgery.

## 2014-05-08 NOTE — Progress Notes (Signed)
Subjective:    [name], male    DOB: [birth year], 49 y.o.   MRN: [id number]  Chief Complaint  Patient presents with  . Establish Care    has sleep apnea needs parts for his machine at home, would like to talk about some referrals    HPI:  Isaac Patel is a 49 y.o. male who presents today to establish care and discuss his sleep apnea.  1) Apnea - Associated symptoms snoring and daytime fatigue has been going on for chronically. Was diagnosed a couple of years ago in [place]. Last sleep study was performed in 2014 confirming the sleep apnea. Indicates that he has been using a BiPAP machine and the mask does not fit very well that has some leaks which did help with some of the symptoms. Would like to have a referral to sleep medicine.  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 Lately, he has experienced the associated symptom of ncreased itchiness. There are no current treatments being performed at this time. Pain is 0/10.    No Known Allergies   No current outpatient prescriptions on file prior to visit.   No current facility-administered medications on file prior to visit.    Past Medical History  Diagnosis Date  . Depression   . GERD (gastroesophageal reflux disease)   . Sleep apnea     Past Surgical History  Procedure Laterality Date  . Amputation  Left   . Tympanostomy tube placement      Family History  Problem Relation Age of Onset  . Healthy Mother   . Healthy Father   . Multiple sclerosis Maternal Grandfather     History   Social History  . Marital Status: Married    Spouse Name: N/A  . Number of Children: 2  . Years of Education: 14   Occupational History  . CMA    Social History Main Topics  . Smoking status: Current Every Day Smoker -- 1.00 packs/day for 30 years    Types: Cigarettes  . Smokeless tobacco:  Never Used  . Alcohol Use: 3.6 - 4.8 oz/week    2-3 Standard drinks or equivalent, 4-5 Cans of beer per week  . Drug Use: No  . Sexual Activity: Not on file   Other Topics Concern  . Not on file   Social History Narrative   Born and raised in [place], [place].  Currently resides in a house with his wife and children. 1 dog. Fun: play computer games, sports.    Denies any religious beliefs effecting health care.    Left BEA     Review of Systems  Constitutional: Positive for fatigue. Negative for fever and chills.  HENT: Negative for ear discharge and ear pain.   Respiratory: Negative for chest tightness and shortness of breath.   Psychiatric/Behavioral: Positive for sleep disturbance.      Objective:    BP 132/92 mmHg  Pulse 61  Temp(Src) 98 F (36.7 C) (Oral)  Resp 18  Ht 5\' 11"  (1.803 m)  Wt 190 lb 12.8 oz (86.546 kg)  BMI 26.62 kg/m2  SpO2 99% Nursing note and vital signs reviewed.  Physical Exam  Constitutional: He is oriented to person, place, and time. He appears well-developed and well-nourished. No distress.  HENT:  Right Ear: Hearing and external ear normal.  Left Ear: Hearing and external ear normal.  Nose:  Nose normal.  Mouth/Throat: Uvula is midline, oropharynx is clear and moist and mucous membranes are normal.  Cerumen noted bilaterally obstructing view of TM.   Cardiovascular: Normal rate, regular rhythm, normal heart sounds and intact distal pulses.   Pulmonary/Chest: Effort normal and breath sounds normal.  Musculoskeletal:  Left below elbow amputation noted.   Neurological: He is alert and oriented to person, place, and time.  Skin: Skin is warm and dry.  Psychiatric: He has a normal mood and affect. His behavior is normal. Judgment and thought content normal.       Assessment & Plan:

## 2014-05-29 ENCOUNTER — Encounter: Payer: Self-pay | Admitting: Family

## 2014-05-29 ENCOUNTER — Ambulatory Visit (INDEPENDENT_AMBULATORY_CARE_PROVIDER_SITE_OTHER): Payer: 59 | Admitting: Family

## 2014-05-29 VITALS — BP 130/90 | HR 87 | Temp 98.1°F | Resp 18 | Ht 71.0 in | Wt 187.0 lb

## 2014-05-29 DIAGNOSIS — Z0001 Encounter for general adult medical examination with abnormal findings: Secondary | ICD-10-CM | POA: Insufficient documentation

## 2014-05-29 DIAGNOSIS — Z0000 Encounter for general adult medical examination without abnormal findings: Secondary | ICD-10-CM

## 2014-05-29 DIAGNOSIS — R21 Rash and other nonspecific skin eruption: Secondary | ICD-10-CM | POA: Insufficient documentation

## 2014-05-29 MED ORDER — NYSTATIN 100000 UNIT/GM EX CREA: 1.0000 "application " | TOPICAL_CREAM | Freq: Two times a day (BID) | CUTANEOUS | Status: DC

## 2014-05-29 NOTE — Progress Notes (Signed)
Pre visit review using our clinic review tool, if applicable. No additional management support is needed unless otherwise documented below in the visit note. 

## 2014-05-29 NOTE — Assessment & Plan Note (Signed)
Small rash consistent with residual fluid collection noted on left upper extremity. Start nystatin cream. Discussed with patient using over-the-counter nystatin powder. Refer to dermatology for skin exam and further management needed.

## 2014-05-29 NOTE — Assessment & Plan Note (Signed)
1) Anticipatory Guidance: Discussed importance of wearing a seatbelt while driving and not texting while driving; changing batteries in smoke detector at least once annually; wearing suntan lotion when outside; eating a balanced and moderate diet; getting physical activity at least 30 minutes per day.  2) Immunizations / Screenings / Labs:  All immunizations are up to date per recommendations. All screenings are up to date per recommendations. Obtain CBC, BMET, Lipid profile and TSH when fasting.   Overall well physical exam. Patient's risk factors include tobacco use and sedentary lifestyle. Patient is not ready to quit smoking. Discussed risks with continued tobacco use. Discussed increasing physical activity to goal of 30 minutes every day of the week. Follow up prevention exam in one year.

## 2014-05-29 NOTE — Progress Notes (Signed)
Subjective:    [name], male    DOB: 24-Jan-1966, 49 y.o.   MRN: [id number]  Chief Complaint  Patient presents with  . CPE    not fasting    HPI:  Isaac Patel is a 49 y.o. male who presents today for an annual wellness visit.   1) Health Maintenance -   Diet - Averages about 3 meals per day with minimal snacks; trying to avoid fast foods; generally consist of fruits and vegetables. 2-3 cups of caffeine per day.   Exercise - No structured exercise; walking the doing and around work.     2) Preventative Exams / Immunizations:  Dental -- Due for exam  Vision -- Refer for exam  Health Maintenance  Topic Date Due  . HIV Screening  12/09/1979  . INFLUENZA VACCINE  10/28/2014  . TETANUS/TDAP  04/07/2015     There is no immunization history on file for this patient.  No Known Allergies  Current Outpatient Prescriptions on File Prior to Visit  Medication Sig Dispense Refill  . pantoprazole (PROTONIX) 40 MG tablet Take 1 tablet (40 mg total) by mouth daily. 90 tablet 3   No current facility-administered medications on file prior to visit.    Past Medical History  Diagnosis Date  . Depression   . GERD (gastroesophageal reflux disease)   . Sleep apnea     Past Surgical History  Procedure Laterality Date  . Amputation  Left   . Tympanostomy tube placement      Family History  Problem Relation Age of Onset  . Healthy Mother   . Healthy Father   . Multiple sclerosis Maternal Grandfather     History   Social History  . Marital Status: Married    Spouse Name: N/A  . Number of Children: 2  . Years of Education: 14   Occupational History  . CMA    Social History Main Topics  . Smoking status: Current Every Day Smoker -- 1.00 packs/day for 30 years    Types: Cigarettes  . Smokeless tobacco: Never Used  . Alcohol Use: 3.6 - 4.8 oz/week    2-3 Standard drinks or equivalent, 4-5 Cans of beer per week  . Drug Use: No  . Sexual  Activity: Not on file   Other Topics Concern  . Not on file   Social History Narrative   Born and raised in [place], [place].  Currently resides in a house with his wife and children. 1 dog. Fun: play computer games, sports.    Denies any religious beliefs effecting health care.    Left BEA      Review of Systems  Constitutional: Denies fever, chills, fatigue, or significant weight gain/loss. HENT: Head: Denies headache or neck pain Ears: Denies changes in hearing, ringing in ears, earache, drainage Nose: Denies discharge, stuffiness, itching, nosebleed, sinus pain Throat: Denies sore throat, hoarseness, dry mouth, sores, thrush Eyes: Denies loss/changes in vision, pain, redness, blurry/double vision, flashing lights Cardiovascular: Denies chest pain/discomfort, tightness, palpitations, shortness of breath with activity, difficulty lying down, swelling, sudden awakening with shortness of breath Respiratory: Denies shortness of breath, cough, sputum production, wheezing Gastrointestinal: Denies dysphasia, heartburn, change in appetite, nausea, change in bowel habits, rectal bleeding, constipation, diarrhea, yellow skin or eyes Genitourinary: Denies frequency, urgency, burning/pain, blood in urine, incontinence, change in urinary strength. Musculoskeletal: Denies muscle/joint pain, stiffness, back pain, redness or swelling of joints, trauma Skin: Denies lumps, itching, dryness, color changes, or hair/nail changes Small rash  noted on left upper extremity Neurological: Denies dizziness, fainting, seizures, weakness, numbness, tingling, tremor Psychiatric - Denies nervousness, stress, depression or memory loss Endocrine: Denies heat or cold intolerance, sweating, frequent urination, excessive thirst, changes in appetite Hematologic: Denies ease of bruising or bleeding     Objective:     BP 130/90 mmHg  Pulse 87  Temp(Src) 98.1 F (36.7 C) (Oral)  Resp 18  Ht 5\' 11"  (1.803 m)  Wt 187  lb (84.823 kg)  BMI 26.09 kg/m2  SpO2 98%  Nursing note and vital signs reviewed.  Physical Exam  Constitutional: He is oriented to person, place, and time. He appears well-developed and well-nourished.  HENT:  Head: Normocephalic.  Right Ear: Hearing, tympanic membrane, external ear and ear canal normal.  Left Ear: Hearing, tympanic membrane, external ear and ear canal normal.  Nose: Nose normal.  Mouth/Throat: Uvula is midline, oropharynx is clear and moist and mucous membranes are normal.  Eyes: Conjunctivae and EOM are normal. Pupils are equal, round, and reactive to light.  Neck: Neck supple. No JVD present. No tracheal deviation present. No thyromegaly present.  Cardiovascular: Normal rate, regular rhythm, normal heart sounds and intact distal pulses.   Pulmonary/Chest: Effort normal and breath sounds normal.  Abdominal: Soft. Bowel sounds are normal. He exhibits no distension and no mass. There is no tenderness. There is no rebound and no guarding.  Musculoskeletal: Normal range of motion. He exhibits no edema or tenderness.  Left BEA noted. With some skin redness.   Lymphadenopathy:    He has no cervical adenopathy.  Neurological: He is alert and oriented to person, place, and time. He has normal reflexes. No cranial nerve deficit. He exhibits normal muscle tone. Coordination normal.  Skin: Skin is warm and dry.  Psychiatric: He has a normal mood and affect. His behavior is normal. Judgment and thought content normal.       Assessment & Plan:

## 2014-05-29 NOTE — Patient Instructions (Signed)

## 2014-05-30 ENCOUNTER — Telehealth: Payer: Self-pay | Admitting: Family

## 2014-05-30 NOTE — Telephone Encounter (Signed)
emmi mailed  °

## 2014-06-18 ENCOUNTER — Ambulatory Visit (INDEPENDENT_AMBULATORY_CARE_PROVIDER_SITE_OTHER): Payer: 59 | Admitting: Pulmonary Disease

## 2014-06-18 ENCOUNTER — Encounter (INDEPENDENT_AMBULATORY_CARE_PROVIDER_SITE_OTHER): Payer: Self-pay

## 2014-06-18 ENCOUNTER — Encounter: Payer: Self-pay | Admitting: Pulmonary Disease

## 2014-06-18 DIAGNOSIS — G4733 Obstructive sleep apnea (adult) (pediatric): Secondary | ICD-10-CM | POA: Diagnosis not present

## 2014-06-18 NOTE — Patient Instructions (Signed)
Will order new mask and supplies, and will also get a download off your device to make sure everything is working properly followup with me again in one year if doing well.

## 2014-06-18 NOTE — Progress Notes (Signed)
Subjective:    [name], male    DOB: 06-17-1965, 49 y.o.   MRN: [id number]  HPI The patient is a 49 year old male who I have been asked to see for management of obstructive sleep apnea. He has moved here recently from [facility], where he wore C Pap for obstructive sleep apnea that was diagnosed approximately 3 years ago. He has done very well with his C Pap device overall, but currently is in need of a new mask and supplies. He has yet to establish with a home care company in town and will need a referral. His sleep study is unavailable for my review, but he thinks he had moderate to severe disease. He is been wearing his C Pap compliantly, and his device is approximately 50 years old. He is on the automatic setting with a pressure range of 6-20 cm of water. Overall, he feels that he sleeps fairly well with the device, although has an occasional morning that he is not rested. He is satisfied with his alertness during the day, and his Epworth score today is 3. The patient states that his weight is completely stable from his original sleep study.   Sleep Questionnaire What time do you typically go to bed?( Between what hours) 9-11p 9-11p at 1602 on 06/18/14 by Inge Rise, CMA How long does it take you to fall asleep? 5-15 min 5-15 min at 1602 on 06/18/14 by Inge Rise, CMA How many times during the night do you wake up? 2 2 at 1602 on 06/18/14 by Inge Rise, CMA What time do you get out of bed to start your day? 0530 0530 at 1602 on 06/18/14 by Inge Rise, CMA Do you drive or operate heavy machinery in your occupation? No No at 1602 on 06/18/14 by Inge Rise, CMA How much has your weight changed (up or down) over the past two years? (In pounds) 0 oz (0 kg) 0 oz (0 kg) at 1602 on 06/18/14 by Inge Rise, CMA Have you ever had a sleep study before? Yes Yes at 1602 on 06/18/14 by Inge Rise, CMA If yes, [facility]  [place] [place] at 1602 on 06/18/14 by Inge Rise, CMA If yes, date of study? 3-4 years ago 3-4 years ago at 1602 on 06/18/14 by Inge Rise, CMA Do you currently use CPAP? Yes Yes at 1602 on 06/18/14 by Inge Rise, CMA If so, what pressure? cpap 6-20 cm cpap 6-20 cm at 1602 on 06/18/14 by Inge Rise, CMA Do you wear oxygen at any time? No No at 1602 on 06/18/14 by Inge Rise, CMA   Review of Systems  Constitutional: Negative for fever and unexpected weight change.  HENT: Positive for congestion, dental problem and sneezing. Negative for ear pain, nosebleeds, postnasal drip, rhinorrhea, sinus pressure, sore throat and trouble swallowing.   Eyes: Negative for redness and itching.  Respiratory: Negative for cough, chest tightness, shortness of breath and wheezing.   Cardiovascular: Negative for palpitations and leg swelling.  Gastrointestinal: Negative for nausea and vomiting.  Genitourinary: Negative for dysuria.  Musculoskeletal: Negative for joint swelling.  Skin: Negative for rash.  Neurological: Negative for headaches.  Hematological: Does not bruise/bleed easily.  Psychiatric/Behavioral: Negative for dysphoric mood. The patient is not nervous/anxious.        Objective:   Physical Exam Constitutional:  Well developed, no acute distress  HENT:  Nares  patent without discharge  Oropharynx without exudate, palate and uvula are elongated  Eyes:  Perrla, eomi, no scleral icterus  Neck:  No JVD, no TMG  Cardiovascular:  Normal rate, regular rhythm, no rubs or gallops.  No murmurs        Intact distal pulses  Pulmonary :  Normal breath sounds, no stridor or respiratory distress   No rales, rhonchi, or wheezing  Abdominal:  Soft, nondistended, bowel sounds present.  No tenderness noted.   Musculoskeletal:  No lower extremity edema noted.  Lymph Nodes:  No cervical lymphadenopathy noted  Skin:  No cyanosis noted  Neurologic:  Alert,  appropriate, moves all 4 extremities without obvious deficit.         Assessment & Plan:

## 2014-06-18 NOTE — Assessment & Plan Note (Signed)
The patient has a history of obstructive sleep apnea diagnosed about 3 years ago, and has done fairly well with C Pap since that time. He is currently on the auto setting, and is satisfied with his sleep and daytime alertness. He is overdue for a new mask and supplies, and we'll therefore get him referred to a new home care company in the [place] area. I would also like to get a download off his device and make sure things are going well. I have asked him to follow-up with me on a yearly basis.

## 2014-07-04 ENCOUNTER — Telehealth: Payer: Self-pay | Admitting: Pulmonary Disease

## 2014-07-04 NOTE — Telephone Encounter (Signed)
Spoke with pt wife, states that they have not heard from [facility] regarding getting the patient new CPAP supplies. Pt wife states that as of last month (06/18/14) an order was placed for their records to be transferred from the [facility] down here to the local [facility] and they have not heard anything more since. Pt wife states that they have had a very bad experience with [facility] Romans is [place] and she has high hopes that this office will not be the same. I advised her that I would call and see what is going on and why they have not heard anything from the local office.   Called and spoke with Anthem at [facility], states that they have been trying to contact the patient and have not received any call backs. I asked Lynn Ito what phone number they have on file - they had been calling their old number. [facility]ette Rel[facility] new updated numbers for the patient and Lynn Ito states that they will be contacting them today.  Called pt wife back, advised that new numbers given to [facility] and someone will be calling today.  Advised wife to call if nothing heard within next 2 days.  Nothing further needed.

## 2014-08-09 ENCOUNTER — Telehealth: Payer: Self-pay | Admitting: Pulmonary Disease

## 2014-08-09 DIAGNOSIS — G4733 Obstructive sleep apnea (adult) (pediatric): Secondary | ICD-10-CM

## 2014-08-09 NOTE — Telephone Encounter (Signed)
lmtcb x1 for pt. 

## 2014-08-12 NOTE — Telephone Encounter (Signed)
lmtcb x2 for pt. 

## 2014-08-13 NOTE — Telephone Encounter (Signed)
lmtcb for pt.  

## 2014-08-14 NOTE — Telephone Encounter (Signed)
Spoke with pt, states he needs a new SD card for his cpap machine.  I advised him to contact [facility] for this as we do not supply these.  Also, pt states his mask straps don't secure mask, also is using a large mask when he needs a medium mask.  States he has been fitted to the [employer] fx medium size.  Pt is requesting that we order him a new mask, straps and supplies as he feels his download at this time wouldn't be accurate anyways.  [place] are you ok with ordering new mask/supplies for pt?

## 2014-08-14 NOTE — Telephone Encounter (Signed)
Ok with me 

## 2014-08-14 NOTE — Telephone Encounter (Signed)
Order has been placed. Pt's wife is aware that this has been done. Nothing further was needed.

## 2014-10-28 ENCOUNTER — Inpatient Hospital Stay (HOSPITAL_COMMUNITY)
Admission: EM | Admit: 2014-10-28 | Discharge: 2014-10-30 | DRG: 247 | Disposition: A | Discharge status: Home / Self Care | Payer: 59 | Attending: Cardiology | Admitting: Cardiology

## 2014-10-28 ENCOUNTER — Encounter (HOSPITAL_COMMUNITY): Payer: Self-pay | Admitting: *Deleted

## 2014-10-28 ENCOUNTER — Emergency Department (HOSPITAL_COMMUNITY): Payer: 59

## 2014-10-28 DIAGNOSIS — Z720 Tobacco use: Secondary | ICD-10-CM | POA: Diagnosis not present

## 2014-10-28 DIAGNOSIS — Z89202 Acquired absence of left upper limb, unspecified level: Secondary | ICD-10-CM

## 2014-10-28 DIAGNOSIS — R079 Chest pain, unspecified: Secondary | ICD-10-CM | POA: Diagnosis present

## 2014-10-28 DIAGNOSIS — I214 Non-ST elevation (NSTEMI) myocardial infarction: Principal | ICD-10-CM

## 2014-10-28 DIAGNOSIS — G4730 Sleep apnea, unspecified: Secondary | ICD-10-CM | POA: Diagnosis present

## 2014-10-28 DIAGNOSIS — E785 Hyperlipidemia, unspecified: Secondary | ICD-10-CM | POA: Diagnosis not present

## 2014-10-28 DIAGNOSIS — K219 Gastro-esophageal reflux disease without esophagitis: Secondary | ICD-10-CM | POA: Diagnosis present

## 2014-10-28 DIAGNOSIS — I2510 Atherosclerotic heart disease of native coronary artery without angina pectoris: Secondary | ICD-10-CM | POA: Diagnosis not present

## 2014-10-28 DIAGNOSIS — I252 Old myocardial infarction: Secondary | ICD-10-CM | POA: Diagnosis present

## 2014-10-28 DIAGNOSIS — F329 Major depressive disorder, single episode, unspecified: Secondary | ICD-10-CM | POA: Diagnosis present

## 2014-10-28 DIAGNOSIS — F17210 Nicotine dependence, cigarettes, uncomplicated: Secondary | ICD-10-CM | POA: Diagnosis present

## 2014-10-28 DIAGNOSIS — R001 Bradycardia, unspecified: Secondary | ICD-10-CM | POA: Diagnosis not present

## 2014-10-28 DIAGNOSIS — Z23 Encounter for immunization: Secondary | ICD-10-CM | POA: Diagnosis not present

## 2014-10-28 HISTORY — DX: Obstructive sleep apnea (adult) (pediatric): G47.33

## 2014-10-28 HISTORY — DX: Non-ST elevation (NSTEMI) myocardial infarction: I21.4

## 2014-10-28 HISTORY — DX: Headache, unspecified: R51.9

## 2014-10-28 HISTORY — DX: Headache: R51

## 2014-10-28 LAB — CBC
HCT: 43.4 % (ref 39.0–52.0)
Hemoglobin: 14.9 g/dL (ref 13.0–17.0)
MCH: 32.5 pg (ref 26.0–34.0)
MCHC: 34.3 g/dL (ref 30.0–36.0)
MCV: 94.8 fL (ref 78.0–100.0)
Platelets: 212 10*3/uL (ref 150–400)
RBC: 4.58 MIL/uL (ref 4.22–5.81)
RDW: 12.5 % (ref 11.5–15.5)
WBC: 8.8 10*3/uL (ref 4.0–10.5)

## 2014-10-28 LAB — CBC WITH DIFFERENTIAL/PLATELET
BASOS PCT: 1 % (ref 0–1)
Basophils Absolute: 0 10*3/uL (ref 0.0–0.1)
EOS ABS: 0.5 10*3/uL (ref 0.0–0.7)
EOS PCT: 6 % — AB (ref 0–5)
HCT: 42.7 % (ref 39.0–52.0)
Hemoglobin: 14.8 g/dL (ref 13.0–17.0)
LYMPHS PCT: 28 % (ref 12–46)
Lymphs Abs: 2.2 10*3/uL (ref 0.7–4.0)
MCH: 33 pg (ref 26.0–34.0)
MCHC: 34.7 g/dL (ref 30.0–36.0)
MCV: 95.3 fL (ref 78.0–100.0)
Monocytes Absolute: 0.8 10*3/uL (ref 0.1–1.0)
Monocytes Relative: 10 % (ref 3–12)
NEUTROS PCT: 55 % (ref 43–77)
Neutro Abs: 4.3 10*3/uL (ref 1.7–7.7)
Platelets: 195 10*3/uL (ref 150–400)
RBC: 4.48 MIL/uL (ref 4.22–5.81)
RDW: 12.4 % (ref 11.5–15.5)
WBC: 7.8 10*3/uL (ref 4.0–10.5)

## 2014-10-28 LAB — PROTIME-INR
INR: 1.08 (ref 0.00–1.49)
PROTHROMBIN TIME: 14.2 s (ref 11.6–15.2)

## 2014-10-28 LAB — BASIC METABOLIC PANEL
Anion gap: 6 (ref 5–15)
BUN: 21 mg/dL — AB (ref 6–20)
CALCIUM: 8.7 mg/dL — AB (ref 8.9–10.3)
CHLORIDE: 107 mmol/L (ref 101–111)
CO2: 25 mmol/L (ref 22–32)
CREATININE: 1.2 mg/dL (ref 0.61–1.24)
GFR calc Af Amer: >60 mL/min (ref >60)
GFR calc non Af Amer: >60 mL/min (ref >60)
Glucose, Bld: 102 mg/dL — ABNORMAL HIGH (ref 65–99)
POTASSIUM: 4.2 mmol/L (ref 3.5–5.1)
Sodium: 138 mmol/L (ref 135–145)

## 2014-10-28 LAB — I-STAT TROPONIN, ED
Troponin i, poc: 0.03 ng/mL (ref 0.00–0.08)
Troponin i, poc: 0.16 ng/mL (ref 0.00–0.08)

## 2014-10-28 LAB — APTT: aPTT: 55 seconds — ABNORMAL HIGH (ref 24–37)

## 2014-10-28 LAB — TROPONIN I: TROPONIN I: 0.11 ng/mL — AB (ref <0.031)

## 2014-10-28 MED ORDER — ASPIRIN 300 MG RE SUPP
300.0000 mg | RECTAL | Status: AC
  Filled 2014-10-28: qty 1

## 2014-10-28 MED ORDER — NITROGLYCERIN 0.4 MG SL SUBL: 0.4000 mg | SUBLINGUAL_TABLET | SUBLINGUAL | Status: DC | PRN

## 2014-10-28 MED ORDER — HEPARIN (PORCINE) IN NACL 100-0.45 UNIT/ML-% IJ SOLN: 12.0000 [IU]/kg/h | INTRAMUSCULAR | Status: DC

## 2014-10-28 MED ORDER — ONDANSETRON HCL 4 MG/2ML IJ SOLN: 4.0000 mg | Freq: Four times a day (QID) | INTRAMUSCULAR | Status: DC | PRN

## 2014-10-28 MED ORDER — HEPARIN SODIUM (PORCINE) 5000 UNIT/ML IJ SOLN: 4000.0000 [IU] | Freq: Once | INTRAMUSCULAR | Status: DC

## 2014-10-28 MED ORDER — HEPARIN BOLUS VIA INFUSION
4000.0000 [IU] | Freq: Once | INTRAVENOUS | Status: AC
  Administered 2014-10-28: 4000 [IU] via INTRAVENOUS
  Filled 2014-10-28: qty 4000

## 2014-10-28 MED ORDER — ASPIRIN 81 MG PO CHEW: 324.0000 mg | CHEWABLE_TABLET | ORAL | Status: AC

## 2014-10-28 MED ORDER — ATORVASTATIN CALCIUM 80 MG PO TABS
80.0000 mg | ORAL_TABLET | Freq: Every day | ORAL | Status: DC
  Administered 2014-10-29: 19:00:00 80 mg via ORAL
  Filled 2014-10-28 (×2): qty 1

## 2014-10-28 MED ORDER — PANTOPRAZOLE SODIUM 40 MG PO TBEC
40.0000 mg | DELAYED_RELEASE_TABLET | Freq: Every day | ORAL | Status: DC
  Administered 2014-10-29 – 2014-10-30 (×2): 40 mg via ORAL
  Filled 2014-10-28 (×2): qty 1

## 2014-10-28 MED ORDER — PNEUMOCOCCAL VAC POLYVALENT 25 MCG/0.5ML IJ INJ
0.5000 mL | INJECTION | INTRAMUSCULAR | Status: DC
  Filled 2014-10-28 (×2): qty 0.5

## 2014-10-28 MED ORDER — ACETAMINOPHEN 325 MG PO TABS: 650.0000 mg | ORAL_TABLET | ORAL | Status: DC | PRN

## 2014-10-28 MED ORDER — ASPIRIN EC 81 MG PO TBEC
81.0000 mg | DELAYED_RELEASE_TABLET | Freq: Every day | ORAL | Status: DC
  Filled 2014-10-28: qty 1

## 2014-10-28 MED ORDER — HEPARIN (PORCINE) IN NACL 100-0.45 UNIT/ML-% IJ SOLN
1350.0000 [IU]/h | INTRAMUSCULAR | Status: DC
  Administered 2014-10-28: 1100 [IU]/h via INTRAVENOUS
  Filled 2014-10-28 (×3): qty 250

## 2014-10-28 NOTE — Progress Notes (Signed)
Pt arrived to unit per stretcher. VSS. Pt complains of 1/10 chest tightness. Pt educated that nitroglycerin is available if pain worsens. Pt oriented to room. Call bell within reach. Will continue to monitor closely.  Raliegh Ip RN

## 2014-10-28 NOTE — ED Provider Notes (Signed)
History   Chief Complaint  Patient presents with  . Chest Pain    HPI  Isaac Patel is a 49 y.o. male with PMH as below notable for GERD, smoker who presents to ED with c/o CP. Pain is described as gradual intermittent burning substernal CP for past few weeks, and at 0100 started while sleeping and it radiated to L arm and pt became diaphoretic. Pt has had similar pain (minus radiation and diaphoresis) in past which felt like reflux and protonix helped. Pt out of protonix for "a while."  Aggravating factors: lying down, meals.  Alleviating factors: protonix.  Associated sxs include diaphoresis.  Denies SOB, N/V Pt denies leg pain/swelling, abd pain, recent illness, cough, fever Currently not having sxs. No family cardiac hx.    Past medical/surgical history, social history, medications, allergies and FH have been reviewed with patient and/or in documentation.  Past Medical History  Diagnosis Date  . Depression   . GERD (gastroesophageal reflux disease)   . Sleep apnea    Past Surgical History  Procedure Laterality Date  . Amputation  Left   . Tympanostomy tube placement     Family History  Problem Relation Age of Onset  . Healthy Mother   . Healthy Father   . Multiple sclerosis Maternal Grandfather    History  Substance Use Topics  . Smoking status: Current Every Day Smoker -- 1.00 packs/day for 30 years    Types: Cigarettes  . Smokeless tobacco: Never Used  . Alcohol Use: 3.6 - 4.8 oz/week    2-3 Standard drinks or equivalent, 4-5 Cans of beer per week     Comment: 3-4 drinks per week     Review of Systems Constitutional: Negative for fever, chills and fatigue. + diaphoresis HENT: Negative for congestion, rhinorrhea and sore throat.   Eyes: Negative for visual disturbance.  Respiratory: - SOB, Negative for cough and wheezing.   Cardiovascular: + for CP.  Gastrointestinal: - nausea, vomiting; denies abdominal pain and diarrhea.  Genitourinary: Negative for  flank pain, dysuria, frequency.  Musculoskeletal: Negative for back pain, neck pain and neck stiffness, leg pain/swelling.  Skin: Negative for rash.  Neurological: Negative for dizziness and headaches.  All other systems reviewed and are negative.   Physical Exam  Physical Exam  ED Triage Vitals  Enc Vitals Group     BP 10/28/14 1523 123/71 mmHg     Pulse Rate 10/28/14 1523 67     Resp 10/28/14 1523 16     Temp 10/28/14 1523 98.1 F (36.7 C)     Temp Source 10/28/14 1523 Oral     SpO2 10/28/14 1523 97 %     Weight 10/28/14 1523 184 lb (83.462 kg)     Height --      Head Cir --      Peak Flow --      Pain Score --      Pain Loc --      Pain Edu? --      Excl. in [place]? --    Constitutional: Patient is well-appearing in no acute distress Head: Normocephalic and atraumatic.  Eyes: Extraocular motion intact, no scleral icterus Neck: Supple without meningismus, mass, or overt JVD.  Respiratory: Effort normal and breath sounds normal. No respiratory distress. Chest wall: nonreproducible CP on exam CV: Heart regular rate and rhythm, no obvious murmurs.  Pulses +2 and symmetric. Euvolemic on exam. Abdomen: Soft, non-tender, non-distended MSK: Extremities are atraumatic without deformity, ROM intact. No calf TTP.  Skin: Warm, dry, intact. Neuro: Alert and oriented, no motor deficit noted Psychiatric: Mood and affect are normal.    ED Course  Procedures   Labs Reviewed  [place], ED   I personally reviewed and interpreted all labs.  DG Chest Port 1 View    (Results Pending)   I personally viewed above image(s) which were used in my medical decision making. Formal interpretations by Radiology.  MDM: Isaac Patel is a 49 y.o. male who p/w CP. EMS gave 324 mg ASA and 1 NTG. Pain is associated with meals raising c/f GERD, esophageal spasm, hiatal hernia as cause as well. However, given pt also c/o substernal pressure and diaphoresis,  this dose raise c/f ACS as well.  ABCs intact. HDS, NAD. Cardiac w/u initiated.   EKG personally review by myself and showed: Rate of 65. Intervals / duration PR 0.114 sec., QRS 0.096 sec., and QTc 0.437 sec. Axis degrees normal. Clinical impression: there are no previous tracings available for comparison, normal sinus rhythm, concave ST segments in inf leads with J point elevation which may be early repol; no recip changes. Does not meet criteria for STEMI. Indication: CP    HEART Score 2. Plan for delta Tn.  Risk: smoker Previous w/u: none  Initial Tn neg.  7:25 PM Labs notable for elevated Tn. Repeat EKG ordered. Pt without CP currently. Heparin given. Cards consult called. Repeat EKG unchanged. Still doesn't meet criteria for STEMI. Cards has seen pt in ED and will plan to admit.  Clinical Impression: 1. NSTEMI (non-ST elevated myocardial infarction)     Disposition: Admit  Condition: stable  I have discussed the results, Dx and Tx plan with the pt(& family if present). He/she/they expressed understanding and agree(s) with the plan.  Pt seen in conjunction with Dr. Benny Lennert, [facility] Resident - PGY-3          Kirstie Peri, MD 10/29/14 2836  Virgel Manifold, MD 10/30/14 [phone]

## 2014-10-28 NOTE — Progress Notes (Signed)
ANTICOAGULATION CONSULT NOTE - Initial Consult  Pharmacy Consult for Heparin Indication: chest pain/ACS  No Known Allergies  Patient Measurements: Weight: 184 lb (83.462 kg) Heparin Dosing Weight: 83 kg  Vital Signs: Temp: 98.1 F (36.7 C) (08/01 1523) Temp Source: Oral (08/01 1523) BP: 117/67 mmHg (08/01 1830) Pulse Rate: 66 (08/01 1830)  Labs:  Recent Labs  10/28/14 1556  HGB 14.9  HCT 43.4  PLT 212  CREATININE 1.20    Estimated Creatinine Clearance: 79.3 mL/min (by C-G formula based on Cr of 1.2).   Medical History: Past Medical History  Diagnosis Date  . Depression   . GERD (gastroesophageal reflux disease)   . Sleep apnea     Medications:   (Not in a hospital admission) Scheduled:  . heparin  4,000 Units Intravenous Once   Infusions:  . heparin      Assessment: 49yo male with history of depression and GERD presents with CP. Pharmacy is consulted to dose heparin for ACS/chest pain. CBC wnl, sCr 1.2, Trop 0.03>>0.16.  Goal of Therapy:  Heparin level 0.3-0.7 units/ml Monitor platelets by anticoagulation protocol: Yes   Plan:  Give 4000 units bolus x 1 Start heparin infusion at 1100 units/hr Check anti-Xa level in 6 hours and daily while on heparin Continue to monitor H&H and platelets  Andrey Cota. Diona Foley, PharmD Clinical Pharmacist Pager [phone] Sharilyn Sites M 10/28/2014,7:26 PM

## 2014-10-28 NOTE — ED Notes (Signed)
Cardiology at the bedside.

## 2014-10-28 NOTE — ED Notes (Signed)
Xray at the bedside.

## 2014-10-28 NOTE — ED Notes (Signed)
Per EMS: pt coming from work with c/o epigastric chest pain radiating to left arm. Pt reports pain as burning. Pt denies any other symptoms. Pt was given 324 asa and nitro x 1. Pt denies chest pain at this time. Pt takes Protonix but has not taken it in awhile. Pt A&Ox4, respirations equal and unlabored, skin warm and dry

## 2014-10-28 NOTE — H&P (Signed)
Admit date: 10/28/2014 Referring Physician:  Dr. Wilson Singer Primary Cardiologist:  None Chief complaint/reason for admission:Chest pain  HPI: This is a 49yo male with no prior cardiac history but history of GERd who presented to the ER with complaints of chest pain.  He was in his USOH until about 4 weeks ago when he started having  An intense burning pain under his sternum with a "shuttering wave" up his his chest and into his left arm.  He would do breathing exercises that would make it go away after about 10 mionutes.  He denied any nausea but would get diaphoretic.  This would occur a few times weekly.  Today the pain was more intense and he was at work and was instructed to go to the aER via EMS.  The pain lasted today for at least 30 minutes to an hour.  EMS gave him 4 baby Asa and NTG but the pain had already subsided before he was given the meds.  In the ER he developed [erssire his chest that currently has resolved.  He denies any SOB, DOE, LE edema, palpitations or syncope.  In ER troponin was noted to be elevated and Cardiology is now asked to evaluate.    PMH:    Past Medical History  Diagnosis Date  . Depression   . GERD (gastroesophageal reflux disease)   . Sleep apnea     PSH:    Past Surgical History  Procedure Laterality Date  . Amputation  Left   . Tympanostomy tube placement      ALLERGIES:   Morphine and related  Prior to Admit Meds:   (Not in a hospital admission) Family HX:    Family History  Problem Relation Age of Onset  . Healthy Mother   . Healthy Father   . Multiple sclerosis Maternal Grandfather    Social HX:    History   Social History  . Marital Status: Married    Spouse Name: N/A  . Number of Children: 2  . Years of Education: 14   Occupational History  . CMA    Social History Main Topics  . Smoking status: Current Every Day Smoker -- 1.00 packs/day for 30 years    Types: Cigarettes  . Smokeless tobacco: Never Used  . Alcohol Use: 9.6 - 10.2  oz/week    2-3 Standard drinks or equivalent, 14 Cans of beer per week  . Drug Use: No  . Sexual Activity: Not on file   Other Topics Concern  . Not on file   Social History Narrative   Born and raised in [place], [place].  Currently resides in a house with his wife and children. 1 dog. Fun: play computer games, sports.    Denies any religious beliefs effecting health care.    Left BEA     ROS:  All 11 ROS were addressed and are negative except what is stated in the HPI  PHYSICAL EXAM Filed Vitals:   10/28/14 1830  BP: 117/67  Pulse: 66  Temp:   Resp: 20   General: Well developed, well nourished, in no acute distress Head: Eyes PERRLA, No xanthomas.   Normal cephalic and atramatic  Lungs:   Clear bilaterally to auscultation and percussion. Heart:   HRRR S1 S2 Pulses are 2+ & equal.            No carotid bruit. No JVD.  No abdominal bruits. No femoral bruits. Abdomen: Bowel sounds are positive, abdomen soft and non-tender without masses  Extremities:   No clubbing, cyanosis or edema.  DP +1 Neuro: Alert and oriented X 3. Psych:  Good affect, responds appropriately   Labs:   Lab Results  Component Value Date   WBC 8.8 10/28/2014   HGB 14.9 10/28/2014   HCT 43.4 10/28/2014   MCV 94.8 10/28/2014   PLT 212 10/28/2014    Recent Labs Lab 10/28/14 1556  NA 138  K 4.2  CL 107  CO2 25  BUN 21*  CREATININE 1.20  CALCIUM 8.7*  GLUCOSE 102*   No results found for: CKTOTAL, CKMB, CKMBINDEX, TROPONINI No results found for: PTT No results found for: INR, PROTIME  No results found for: CHOL No results found for: HDL No results found for: LDLCALC No results found for: TRIG No results found for: CHOLHDL No results found for: LDLDIRECT    Radiology:  Dg Chest Port 1 View  10/28/2014   CLINICAL DATA:  Acute onset of chest pain today.  EXAM: PORTABLE CHEST - 1 VIEW  COMPARISON:  02/02/2014  FINDINGS: The cardiac silhouette, mediastinal and hilar contours are within normal  limits and stable. The lungs are clear. No pleural effusion or pneumothorax. The bony thorax is intact.  IMPRESSION: No acute cardiopulmonary findings.   Electronically Signed   By: Marijo Sanes M.D.   On: 10/28/2014 15:45    EKG:  NSR with PVC's.  < 0.13mm of ST elevation in inferior leads that does not meet criteria for STEMI  ASSESSMENT:  1.  NSTEMI - currently pain free.  EKG shows ? Very minimal ST elevation in aVF that does not meet criteria for STEMI.  Initial trop was negative but subsequent troponin 0.16.   2.  H/O GERD - he ran out of his PPI a while ago 3.  Depression  PLAN:   1.  Admit to tele bed 2.  Cycle enzymes until they peak 3.  IV Heparin per pharmacy protocol 4.  Check FLP and HbgA1C in am 5.  Start high does statin Lipitor 80mg  daily 6.  Hold on BB secondary to borderline bradycardia 7.  ASA 81mg  daily 8.  NPO after MN for cath in am 9.  Left heart cath in am - Cardiac catheterization was discussed with the patient fully including risks on myocardial infarction, death, stroke, bleeding, arrhythmia, dye allergy, renal insufficiency or bleeding.  All patient questions and concerns were discussed and the patient understands and is willing to proceed.   10.  Check 2D echo to assess LVF   Sueanne Margarita, MD  10/28/2014  7:59 PM

## 2014-10-28 NOTE — ED Notes (Signed)
Updated on POC

## 2014-10-28 NOTE — Progress Notes (Signed)
Received report

## 2014-10-29 ENCOUNTER — Encounter (HOSPITAL_COMMUNITY)
Admission: EM | Disposition: A | Discharge status: Home / Self Care | Payer: 59 | Source: Home / Self Care | Attending: Cardiology

## 2014-10-29 ENCOUNTER — Encounter (HOSPITAL_COMMUNITY): Payer: Self-pay | Admitting: Cardiovascular Disease

## 2014-10-29 DIAGNOSIS — R001 Bradycardia, unspecified: Secondary | ICD-10-CM

## 2014-10-29 HISTORY — PX: CARDIAC CATHETERIZATION: SHX172

## 2014-10-29 LAB — CBC
HCT: 42.3 % (ref 39.0–52.0)
HEMOGLOBIN: 14.7 g/dL (ref 13.0–17.0)
MCH: 32.6 pg (ref 26.0–34.0)
MCHC: 34.8 g/dL (ref 30.0–36.0)
MCV: 93.8 fL (ref 78.0–100.0)
PLATELETS: 197 10*3/uL (ref 150–400)
RBC: 4.51 MIL/uL (ref 4.22–5.81)
RDW: 12.4 % (ref 11.5–15.5)
WBC: 7.6 10*3/uL (ref 4.0–10.5)

## 2014-10-29 LAB — BASIC METABOLIC PANEL
Anion gap: 9 (ref 5–15)
BUN: 19 mg/dL (ref 6–20)
CHLORIDE: 105 mmol/L (ref 101–111)
CO2: 24 mmol/L (ref 22–32)
CREATININE: 1.06 mg/dL (ref 0.61–1.24)
Calcium: 8.3 mg/dL — ABNORMAL LOW (ref 8.9–10.3)
GFR calc Af Amer: >60 mL/min (ref >60)
GFR calc non Af Amer: >60 mL/min (ref >60)
Glucose, Bld: 88 mg/dL (ref 65–99)
Potassium: 3.6 mmol/L (ref 3.5–5.1)
SODIUM: 138 mmol/L (ref 135–145)

## 2014-10-29 LAB — HEPARIN LEVEL (UNFRACTIONATED): HEPARIN UNFRACTIONATED: 0.17 [IU]/mL — AB (ref 0.30–0.70)

## 2014-10-29 LAB — SURGICAL PCR SCREEN
MRSA, PCR: NEGATIVE
STAPHYLOCOCCUS AUREUS: POSITIVE — AB

## 2014-10-29 LAB — COMPREHENSIVE METABOLIC PANEL
ALK PHOS: 47 U/L (ref 38–126)
ALT: 16 U/L — AB (ref 17–63)
AST: 18 U/L (ref 15–41)
Albumin: 3.4 g/dL — ABNORMAL LOW (ref 3.5–5.0)
Anion gap: 7 (ref 5–15)
BUN: 20 mg/dL (ref 6–20)
CALCIUM: 8.5 mg/dL — AB (ref 8.9–10.3)
CO2: 24 mmol/L (ref 22–32)
CREATININE: 0.98 mg/dL (ref 0.61–1.24)
Chloride: 108 mmol/L (ref 101–111)
Glucose, Bld: 110 mg/dL — ABNORMAL HIGH (ref 65–99)
POTASSIUM: 3.6 mmol/L (ref 3.5–5.1)
Sodium: 139 mmol/L (ref 135–145)
Total Bilirubin: 0.7 mg/dL (ref 0.3–1.2)
Total Protein: 5.8 g/dL — ABNORMAL LOW (ref 6.5–8.1)

## 2014-10-29 LAB — TSH: TSH: 2.301 u[IU]/mL (ref 0.350–4.500)

## 2014-10-29 LAB — LIPID PANEL
Cholesterol: 166 mg/dL (ref 0–200)
HDL: 44 mg/dL (ref >40)
LDL Cholesterol: 112 mg/dL — ABNORMAL HIGH (ref 0–99)
TRIGLYCERIDES: 51 mg/dL (ref <150)
Total CHOL/HDL Ratio: 3.8 RATIO
VLDL: 10 mg/dL (ref 0–40)

## 2014-10-29 LAB — TROPONIN I
Troponin I: 0.08 ng/mL — ABNORMAL HIGH (ref <0.031)
Troponin I: 0.04 ng/mL — ABNORMAL HIGH (ref <0.031)
Troponin I: <0.03 ng/mL (ref <0.031)
Troponin I: <0.03 ng/mL (ref <0.031)

## 2014-10-29 LAB — PROTIME-INR
INR: 1.07 (ref 0.00–1.49)
Prothrombin Time: 14.1 seconds (ref 11.6–15.2)

## 2014-10-29 LAB — MAGNESIUM: MAGNESIUM: 2 mg/dL (ref 1.7–2.4)

## 2014-10-29 LAB — POCT ACTIVATED CLOTTING TIME: Activated Clotting Time: 454 seconds

## 2014-10-29 SURGERY — LEFT HEART CATH AND CORONARY ANGIOGRAPHY
Anesthesia: LOCAL

## 2014-10-29 MED ORDER — MIDAZOLAM HCL 2 MG/2ML IJ SOLN
INTRAMUSCULAR | Status: DC | PRN
  Administered 2014-10-29: 2 mg via INTRAVENOUS
  Administered 2014-10-29: 1 mg via INTRAVENOUS

## 2014-10-29 MED ORDER — ASPIRIN EC 81 MG PO TBEC: 81.0000 mg | DELAYED_RELEASE_TABLET | Freq: Every day | ORAL | Status: DC

## 2014-10-29 MED ORDER — SODIUM CHLORIDE 0.9 % WEIGHT BASED INFUSION: 1.0000 mL/kg/h | INTRAVENOUS | Status: DC

## 2014-10-29 MED ORDER — ONDANSETRON HCL 4 MG/2ML IJ SOLN: 4.0000 mg | Freq: Four times a day (QID) | INTRAMUSCULAR | Status: DC | PRN

## 2014-10-29 MED ORDER — SODIUM CHLORIDE 0.9 % IJ SOLN: 3.0000 mL | INTRAMUSCULAR | Status: DC | PRN

## 2014-10-29 MED ORDER — DIAZEPAM 5 MG PO TABS
5.0000 mg | ORAL_TABLET | ORAL | Status: DC | PRN
  Administered 2014-10-29 (×2): 5 mg via ORAL
  Filled 2014-10-29 (×2): qty 1

## 2014-10-29 MED ORDER — SODIUM CHLORIDE 0.9 % IV SOLN
INTRAVENOUS | Status: DC | PRN
  Administered 2014-10-29: 82 mL via INTRAVENOUS

## 2014-10-29 MED ORDER — LIDOCAINE HCL (PF) 1 % IJ SOLN
INTRAMUSCULAR | Status: DC | PRN
  Administered 2014-10-29: 20 mL via INTRADERMAL

## 2014-10-29 MED ORDER — BIVALIRUDIN 250 MG IV SOLR
INTRAVENOUS | Status: AC
  Filled 2014-10-29: qty 250

## 2014-10-29 MED ORDER — MIDAZOLAM HCL 2 MG/2ML IJ SOLN
INTRAMUSCULAR | Status: AC
  Filled 2014-10-29: qty 4

## 2014-10-29 MED ORDER — TICAGRELOR 90 MG PO TABS
ORAL_TABLET | ORAL | Status: DC | PRN
  Administered 2014-10-29: 180 mg via ORAL

## 2014-10-29 MED ORDER — SODIUM CHLORIDE 0.9 % IV SOLN: INTRAVENOUS | Status: DC

## 2014-10-29 MED ORDER — ASPIRIN 81 MG PO CHEW: 81.0000 mg | CHEWABLE_TABLET | ORAL | Status: DC

## 2014-10-29 MED ORDER — ASPIRIN EC 81 MG PO TBEC
81.0000 mg | DELAYED_RELEASE_TABLET | Freq: Every day | ORAL | Status: DC
  Administered 2014-10-30: 09:00:00 81 mg via ORAL
  Filled 2014-10-29: qty 1

## 2014-10-29 MED ORDER — NITROGLYCERIN 1 MG/10 ML FOR IR/CATH LAB
INTRA_ARTERIAL | Status: DC | PRN
  Administered 2014-10-29: 200 ug via INTRACORONARY
  Administered 2014-10-29 (×2): 100 ug via INTRACORONARY
  Administered 2014-10-29: 200 ug via INTRACORONARY

## 2014-10-29 MED ORDER — ATORVASTATIN CALCIUM 80 MG PO TABS: 80.0000 mg | ORAL_TABLET | Freq: Every day | ORAL | Status: DC

## 2014-10-29 MED ORDER — IOHEXOL 350 MG/ML SOLN
INTRAVENOUS | Status: DC | PRN
  Administered 2014-10-29: 145 mL via INTRA_ARTERIAL

## 2014-10-29 MED ORDER — HEPARIN BOLUS VIA INFUSION
2500.0000 [IU] | Freq: Once | INTRAVENOUS | Status: AC
  Administered 2014-10-29: 2500 [IU] via INTRAVENOUS
  Filled 2014-10-29: qty 2500

## 2014-10-29 MED ORDER — TICAGRELOR 90 MG PO TABS
90.0000 mg | ORAL_TABLET | Freq: Two times a day (BID) | ORAL | Status: DC
  Administered 2014-10-30 (×2): 90 mg via ORAL
  Filled 2014-10-29 (×2): qty 1

## 2014-10-29 MED ORDER — FENTANYL CITRATE (PF) 100 MCG/2ML IJ SOLN
12.5000 ug | INTRAMUSCULAR | Status: DC | PRN
  Filled 2014-10-29: qty 2

## 2014-10-29 MED ORDER — SODIUM CHLORIDE 0.9 % IJ SOLN: 3.0000 mL | Freq: Two times a day (BID) | INTRAMUSCULAR | Status: DC

## 2014-10-29 MED ORDER — ACETAMINOPHEN 325 MG PO TABS
650.0000 mg | ORAL_TABLET | ORAL | Status: DC | PRN
  Administered 2014-10-29 – 2014-10-30 (×2): 650 mg via ORAL
  Filled 2014-10-29 (×2): qty 2

## 2014-10-29 MED ORDER — TICAGRELOR 90 MG PO TABS
ORAL_TABLET | ORAL | Status: AC
  Filled 2014-10-29: qty 1

## 2014-10-29 MED ORDER — SODIUM CHLORIDE 0.9 % IV SOLN: 250.0000 mL | INTRAVENOUS | Status: DC | PRN

## 2014-10-29 MED ORDER — BIVALIRUDIN BOLUS VIA INFUSION - CUPID
INTRAVENOUS | Status: DC | PRN
  Administered 2014-10-29: 61.575 mg via INTRAVENOUS

## 2014-10-29 MED ORDER — ANGIOPLASTY BOOK
Freq: Once | Status: AC
  Administered 2014-10-29: 20:00:00
  Filled 2014-10-29: qty 1

## 2014-10-29 MED ORDER — FENTANYL CITRATE (PF) 100 MCG/2ML IJ SOLN
INTRAMUSCULAR | Status: DC | PRN
  Administered 2014-10-29: 50 ug via INTRAVENOUS
  Administered 2014-10-29: 25 ug via INTRAVENOUS

## 2014-10-29 MED ORDER — FENTANYL CITRATE (PF) 100 MCG/2ML IJ SOLN
INTRAMUSCULAR | Status: AC
  Filled 2014-10-29: qty 4

## 2014-10-29 MED ORDER — SODIUM CHLORIDE 0.9 % WEIGHT BASED INFUSION
3.0000 mL/kg/h | INTRAVENOUS | Status: DC
  Administered 2014-10-29: 2.996 mL/kg/h via INTRAVENOUS

## 2014-10-29 MED ORDER — LIDOCAINE HCL (PF) 1 % IJ SOLN
INTRAMUSCULAR | Status: AC
  Filled 2014-10-29: qty 30

## 2014-10-29 MED ORDER — NITROGLYCERIN 1 MG/10 ML FOR IR/CATH LAB
INTRA_ARTERIAL | Status: AC
  Filled 2014-10-29: qty 10

## 2014-10-29 MED ORDER — HEPARIN (PORCINE) IN NACL 2-0.9 UNIT/ML-% IJ SOLN
INTRAMUSCULAR | Status: AC
  Filled 2014-10-29: qty 1000

## 2014-10-29 MED ORDER — BIVALIRUDIN 250 MG IV SOLR
250.0000 mg | INTRAVENOUS | Status: DC | PRN
  Administered 2014-10-29: 1.75 mg/kg/h via INTRAVENOUS

## 2014-10-29 MED ORDER — HEPARIN (PORCINE) IN NACL 2-0.9 UNIT/ML-% IJ SOLN
INTRAMUSCULAR | Status: DC | PRN
  Administered 2014-10-29: 13:00:00

## 2014-10-29 MED ORDER — HEPARIN (PORCINE) IN NACL 2-0.9 UNIT/ML-% IJ SOLN
INTRAMUSCULAR | Status: AC
  Filled 2014-10-29: qty 500

## 2014-10-29 SURGICAL SUPPLY — 17 items
BALLN TREK RX 2.5X12 (BALLOONS) ×3
BALLN ~~LOC~~ EMERGE MR 3.25X12 (BALLOONS) ×3
BALLOON TREK RX 2.5X12 (BALLOONS) ×1 IMPLANT
BALLOON ~~LOC~~ EMERGE MR 3.25X12 (BALLOONS) ×1 IMPLANT
CATH INFINITI 5FR MULTPACK ANG (CATHETERS) ×3 IMPLANT
CATH VISTA GUIDE 6FR XBLAD3.5 (CATHETERS) ×3 IMPLANT
KIT ENCORE 26 ADVANTAGE (KITS) ×3 IMPLANT
KIT HEART LEFT (KITS) ×3 IMPLANT
PACK CARDIAC CATHETERIZATION (CUSTOM PROCEDURE TRAY) ×3 IMPLANT
SHEATH PINNACLE 5F 10CM (SHEATH) ×3 IMPLANT
SHEATH PINNACLE 6F 10CM (SHEATH) ×3 IMPLANT
STENT RESOLUTE INTEG 3.0X15 (Permanent Stent) ×3 IMPLANT
SYR MEDRAD MARK V 150ML (SYRINGE) ×3 IMPLANT
TRANSDUCER W/STOPCOCK (MISCELLANEOUS) ×3 IMPLANT
TUBING CIL FLEX 10 FLL-RA (TUBING) ×3 IMPLANT
WIRE ASAHI PROWATER 180CM (WIRE) ×3 IMPLANT
WIRE EMERALD 3MM-J .035X150CM (WIRE) ×3 IMPLANT

## 2014-10-29 NOTE — Interval H&P Note (Signed)
Cath Lab Visit (complete for each Cath Lab visit)  Clinical Evaluation Leading to the Procedure:   ACS: Yes.    Non-ACS:    Anginal Classification: CCS IV  Anti-ischemic medical therapy: Minimal Therapy (1 class of medications)  Non-Invasive Test Results: No non-invasive testing performed  Prior CABG: No previous CABG      History and Physical Interval Note:  10/29/2014 12:16 PM  Isaac Patel  has presented today for surgery, with the diagnosis of NSTEMI  The various methods of treatment have been discussed with the patient and family. After consideration of risks, benefits and other options for treatment, the patient has consented to  Procedure(s): Left Heart Cath and Coronary Angiography (N/A) as a surgical intervention .  The patient's history has been reviewed, patient examined, no change in status, stable for surgery.  I have reviewed the patient's chart and labs.  Questions were answered to the patient's satisfaction.     Kaia Depaolis A

## 2014-10-29 NOTE — Progress Notes (Signed)
Patient Name: Isaac Patel Date of Encounter: 10/29/2014   SUBJECTIVE  Denies chest pain, sob or palpitation.   CURRENT MEDS . aspirin  324 mg Oral NOW   Or  . aspirin  300 mg Rectal NOW  . aspirin  81 mg Oral Pre-Cath  . [START ON 10/30/2014] aspirin EC  81 mg Oral Daily  . atorvastatin  80 mg Oral q1800  . pantoprazole  40 mg Oral Daily  . pneumococcal 23 valent vaccine  0.5 mL Intramuscular Tomorrow-1000  . sodium chloride  3 mL Intravenous Q12H    OBJECTIVE  Filed Vitals:   10/28/14 2150 10/28/14 2234 10/29/14 0447 10/29/14 0558  BP: 123/75 125/83 117/72   Pulse: 60 60 61   Temp:  98.5 F (36.9 C) 98.3 F (36.8 C)   TempSrc:  Oral Oral   Resp:  18 18   Height:  5\' 11"  (1.803 m)    Weight:  180 lb 14.4 oz (82.056 kg)  180 lb 14.4 oz (82.056 kg)  SpO2: 98% 98% 96%     Intake/Output Summary (Last 24 hours) at 10/29/14 1028 Last data filed at 10/29/14 0800  Gross per 24 hour  Intake      0 ml  Output      0 ml  Net      0 ml   Filed Weights   10/28/14 1523 10/28/14 2234 10/29/14 0558  Weight: 184 lb (83.462 kg) 180 lb 14.4 oz (82.056 kg) 180 lb 14.4 oz (82.056 kg)    PHYSICAL EXAM  General: Pleasant, NAD. Neuro: Alert and oriented X 3. Moves all extremities spontaneously. Psych: Normal affect. HEENT:  Normal  Neck: Supple without bruits or JVD. Lungs:  Resp regular and unlabored, CTA. Heart: RRR no s3, s4, or murmurs. Abdomen: Soft, non-tender, non-distended, BS + x 4.  Extremities: No clubbing, cyanosis or edema. DP 1+ and equal bilaterally. Left arm amputated below elbow. 2+ right radial pulse  Accessory Clinical Findings  CBC  Recent Labs  10/28/14 2245 10/29/14 0455  WBC 7.8 7.6  NEUTROABS 4.3  --   HGB 14.8 14.7  HCT 42.7 42.3  MCV 95.3 93.8  PLT 195 371   Basic Metabolic Panel  Recent Labs  10/28/14 2245 10/29/14 0455  NA 139 138  K 3.6 3.6  CL 108 105  CO2 24 24  GLUCOSE 110* 88  BUN 20 19  CREATININE 0.98 1.06    CALCIUM 8.5* 8.3*  MG 2.0  --    Liver Function Tests  Recent Labs  10/28/14 2245  AST 18  ALT 16*  ALKPHOS 47  BILITOT 0.7  PROT 5.8*  ALBUMIN 3.4*   Cardiac Enzymes  Recent Labs  10/28/14 1955 10/28/14 2245 10/29/14 0455  TROPONINI 0.11* 0.08* 0.04*  Fasting Lipid Panel  Recent Labs  10/29/14 0455  CHOL 166  HDL 44  LDLCALC 112*  TRIG 51  CHOLHDL 3.8   Thyroid Function Tests  Recent Labs  10/28/14 2245  TSH 2.301    TELE  NSR   Radiology/Studies  Dg Chest Port 1 View  10/28/2014   CLINICAL DATA:  Acute onset of chest pain today.  EXAM: PORTABLE CHEST - 1 VIEW  COMPARISON:  02/02/2014  FINDINGS: The cardiac silhouette, mediastinal and hilar contours are within normal limits and stable. The lungs are clear. No pleural effusion or pneumothorax. The bony thorax is intact.  IMPRESSION: No acute cardiopulmonary findings.   Electronically Signed   By: Ricky Stabs.D.  On: 10/28/2014 15:45    ASSESSMENT AND PLAN   1. NSTEMI (non-ST elevated myocardial infarction) - POC trop was 0.16. Trop trend  0.11-->0.08-->0.04. Currently no chest pain.  - Cath today.  - pending echo, HgbA1C. LDL of 112. TSH normal.  - Continue heparin per pharmacy, ASA, Lipitor  2. Sinus Bradycardia - tele showed rate of 50-60s. Held BB.    Signed, Leanor Kail PA-C Pager [phone]  Personally seen and examined. Agree with above. Minimal troponin elevation Mild CP, he questions GERD Cath Left arm amputee (bailing accident at work) Works at [employer] across from [place] Would be reasonable given decreased bleeding risk to proceed with right radial approach. Explained exceedingly low risk of vascular complications.   Candee Furbish, MD

## 2014-10-29 NOTE — Progress Notes (Signed)
Site area: right groin  Site Prior to Removal:  Level 0  Pressure Applied For 20 MINUTES    Minutes Beginning at 1530  Manual:   Yes.    Patient Status During Pull:  stable  Post Pull Groin Site:  Level 0  Post Pull Instructions Given:  Yes.    Post Pull Pulses Present:  Yes.    Dressing Applied:  Yes.    Comments:  Sheath pulled, pressure held by Doug Sou

## 2014-10-29 NOTE — Progress Notes (Signed)
ANTICOAGULATION CONSULT NOTE - Follow-up Consult  Pharmacy Consult for Heparin Indication: chest pain/ACS  Allergies  Allergen Reactions  . Morphine And Related Itching    Patient Measurements: Height: 5\' 11"  (180.3 cm) Weight: 180 lb 14.4 oz (82.056 kg) IBW/kg (Calculated) : 75.3 Heparin Dosing Weight: 83 kg  Vital Signs: Temp: 98.5 F (36.9 C) (08/01 2234) Temp Source: Oral (08/01 2234) BP: 125/83 mmHg (08/01 2234) Pulse Rate: 60 (08/01 2234)  Labs:  Recent Labs  10/28/14 1556 10/28/14 1955 10/28/14 2245 10/29/14 0146  HGB 14.9  --  14.8  --   HCT 43.4  --  42.7  --   PLT 212  --  195  --   APTT  --   --  55*  --   LABPROT  --   --  14.2  --   INR  --   --  1.08  --   HEPARINUNFRC  --   --   --  0.17*  CREATININE 1.20  --  0.98  --   TROPONINI  --  0.11* 0.08*  --     Estimated Creatinine Clearance: 97.1 mL/min (by C-G formula based on Cr of 0.98).   Assessment: 49yo male on heparin for NSTEMI. Heparin level 0.17 (subtherapeutic) on 1100 units/hr. No issues with line or bleeding reported per RN. Plan for cath tomorrow - not on schedule yet.  Goal of Therapy:  Heparin level 0.3-0.7 units/ml Monitor platelets by anticoagulation protocol: Yes   Plan:  Rebolus heparin 2500 units Increase heparin to 1350 units/hr Will f/u 6 hour heparin level or post cath  Sherlon Handing, PharmD, BCPS Clinical pharmacist, pager [phone] 10/29/2014,2:54 AM

## 2014-10-29 NOTE — H&P (View-Only) (Signed)
Patient Name: Isaac Patel Date of Encounter: 10/29/2014   SUBJECTIVE  Denies chest pain, sob or palpitation.   CURRENT MEDS . aspirin  324 mg Oral NOW   Or  . aspirin  300 mg Rectal NOW  . aspirin  81 mg Oral Pre-Cath  . [START ON 10/30/2014] aspirin EC  81 mg Oral Daily  . atorvastatin  80 mg Oral q1800  . pantoprazole  40 mg Oral Daily  . pneumococcal 23 valent vaccine  0.5 mL Intramuscular Tomorrow-1000  . sodium chloride  3 mL Intravenous Q12H    OBJECTIVE  Filed Vitals:   10/28/14 2150 10/28/14 2234 10/29/14 0447 10/29/14 0558  BP: 123/75 125/83 117/72   Pulse: 60 60 61   Temp:  98.5 F (36.9 C) 98.3 F (36.8 C)   TempSrc:  Oral Oral   Resp:  18 18   Height:  5\' 11"  (1.803 m)    Weight:  180 lb 14.4 oz (82.056 kg)  180 lb 14.4 oz (82.056 kg)  SpO2: 98% 98% 96%     Intake/Output Summary (Last 24 hours) at 10/29/14 1028 Last data filed at 10/29/14 0800  Gross per 24 hour  Intake      0 ml  Output      0 ml  Net      0 ml   Filed Weights   10/28/14 1523 10/28/14 2234 10/29/14 0558  Weight: 184 lb (83.462 kg) 180 lb 14.4 oz (82.056 kg) 180 lb 14.4 oz (82.056 kg)    PHYSICAL EXAM  General: Pleasant, NAD. Neuro: Alert and oriented X 3. Moves all extremities spontaneously. Psych: Normal affect. HEENT:  Normal  Neck: Supple without bruits or JVD. Lungs:  Resp regular and unlabored, CTA. Heart: RRR no s3, s4, or murmurs. Abdomen: Soft, non-tender, non-distended, BS + x 4.  Extremities: No clubbing, cyanosis or edema. DP 1+ and equal bilaterally. Left arm amputated below elbow. 2+ right radial pulse  Accessory Clinical Findings  CBC  Recent Labs  10/28/14 2245 10/29/14 0455  WBC 7.8 7.6  NEUTROABS 4.3  --   HGB 14.8 14.7  HCT 42.7 42.3  MCV 95.3 93.8  PLT 195 413   Basic Metabolic Panel  Recent Labs  10/28/14 2245 10/29/14 0455  NA 139 138  K 3.6 3.6  CL 108 105  CO2 24 24  GLUCOSE 110* 88  BUN 20 19  CREATININE 0.98 1.06    CALCIUM 8.5* 8.3*  MG 2.0  --    Liver Function Tests  Recent Labs  10/28/14 2245  AST 18  ALT 16*  ALKPHOS 47  BILITOT 0.7  PROT 5.8*  ALBUMIN 3.4*   Cardiac Enzymes  Recent Labs  10/28/14 1955 10/28/14 2245 10/29/14 0455  TROPONINI 0.11* 0.08* 0.04*  Fasting Lipid Panel  Recent Labs  10/29/14 0455  CHOL 166  HDL 44  LDLCALC 112*  TRIG 51  CHOLHDL 3.8   Thyroid Function Tests  Recent Labs  10/28/14 2245  TSH 2.301    TELE  NSR   Radiology/Studies  Dg Chest Port 1 View  10/28/2014   CLINICAL DATA:  Acute onset of chest pain today.  EXAM: PORTABLE CHEST - 1 VIEW  COMPARISON:  02/02/2014  FINDINGS: The cardiac silhouette, mediastinal and hilar contours are within normal limits and stable. The lungs are clear. No pleural effusion or pneumothorax. The bony thorax is intact.  IMPRESSION: No acute cardiopulmonary findings.   Electronically Signed   By: Ricky Stabs.D.  On: 10/28/2014 15:45    ASSESSMENT AND PLAN   1. NSTEMI (non-ST elevated myocardial infarction) - POC trop was 0.16. Trop trend  0.11-->0.08-->0.04. Currently no chest pain.  - Cath today.  - pending echo, HgbA1C. LDL of 112. TSH normal.  - Continue heparin per pharmacy, ASA, Lipitor  2. Sinus Bradycardia - tele showed rate of 50-60s. Held BB.    Signed, Leanor Kail PA-C Pager [phone]  Personally seen and examined. Agree with above. Minimal troponin elevation Mild CP, he questions GERD Cath Left arm amputee (bailing accident at work) Works at [employer] across from [place] Would be reasonable given decreased bleeding risk to proceed with right radial approach. Explained exceedingly low risk of vascular complications.   Candee Furbish, MD

## 2014-10-30 ENCOUNTER — Ambulatory Visit (HOSPITAL_COMMUNITY): Payer: 59

## 2014-10-30 DIAGNOSIS — I2510 Atherosclerotic heart disease of native coronary artery without angina pectoris: Secondary | ICD-10-CM

## 2014-10-30 DIAGNOSIS — R079 Chest pain, unspecified: Secondary | ICD-10-CM

## 2014-10-30 DIAGNOSIS — E785 Hyperlipidemia, unspecified: Secondary | ICD-10-CM

## 2014-10-30 DIAGNOSIS — Z720 Tobacco use: Secondary | ICD-10-CM

## 2014-10-30 LAB — BASIC METABOLIC PANEL
Anion gap: 5 (ref 5–15)
BUN: 16 mg/dL (ref 6–20)
CO2: 23 mmol/L (ref 22–32)
CREATININE: 0.97 mg/dL (ref 0.61–1.24)
Calcium: 8 mg/dL — ABNORMAL LOW (ref 8.9–10.3)
Chloride: 110 mmol/L (ref 101–111)
GFR calc Af Amer: >60 mL/min (ref >60)
Glucose, Bld: 110 mg/dL — ABNORMAL HIGH (ref 65–99)
Potassium: 3.7 mmol/L (ref 3.5–5.1)
SODIUM: 138 mmol/L (ref 135–145)

## 2014-10-30 LAB — CBC
HEMATOCRIT: 41.3 % (ref 39.0–52.0)
HEMOGLOBIN: 14.4 g/dL (ref 13.0–17.0)
MCH: 32.6 pg (ref 26.0–34.0)
MCHC: 34.9 g/dL (ref 30.0–36.0)
MCV: 93.4 fL (ref 78.0–100.0)
PLATELETS: 201 10*3/uL (ref 150–400)
RBC: 4.42 MIL/uL (ref 4.22–5.81)
RDW: 12.3 % (ref 11.5–15.5)
WBC: 7.6 10*3/uL (ref 4.0–10.5)

## 2014-10-30 LAB — HEMOGLOBIN A1C
HEMOGLOBIN A1C: 5.6 % (ref 4.8–5.6)
MEAN PLASMA GLUCOSE: 114 mg/dL

## 2014-10-30 MED ORDER — ASPIRIN 81 MG PO TBEC: 81.0000 mg | DELAYED_RELEASE_TABLET | Freq: Every day | ORAL | Status: DC

## 2014-10-30 MED ORDER — ATORVASTATIN CALCIUM 80 MG PO TABS: 80.0000 mg | ORAL_TABLET | Freq: Every day | ORAL | Status: DC

## 2014-10-30 MED ORDER — NITROGLYCERIN 0.4 MG SL SUBL: 0.4000 mg | SUBLINGUAL_TABLET | SUBLINGUAL | Status: DC | PRN

## 2014-10-30 MED ORDER — TICAGRELOR 90 MG PO TABS: 90.0000 mg | ORAL_TABLET | Freq: Two times a day (BID) | ORAL | Status: DC

## 2014-10-30 MED FILL — Lidocaine HCl Local Preservative Free (PF) Inj 1%: INTRAMUSCULAR | Qty: 30 | Status: AC

## 2014-10-30 MED FILL — Heparin Sodium (Porcine) 2 Unit/ML in Sodium Chloride 0.9%: INTRAMUSCULAR | Qty: 500 | Status: AC

## 2014-10-30 NOTE — Research (Signed)
TWILIGHT Informed Consent   Subject Name: Isaac Patel  Subject met inclusion and exclusion criteria.  The informed consent form, study requirements and expectations were reviewed with the subject and questions and concerns were addressed prior to the signing of the consent form.  The subject verbalized understanding of the trail requirements.  The subject agreed to participate in the [facility] trial and signed the informed consent.  The informed consent was obtained prior to performance of any protocol-specific procedures for the subject.  A copy of the signed informed consent was given to the subject and a copy was placed in the subject's medical record.  Hedrick,Tammy W 10/30/2014, 0825

## 2014-10-30 NOTE — Discharge Summary (Signed)
Physician Discharge Summary     Cardiologist:  Radford Pax- New [name] MRN: [id number] DOB/AGE: Sep 28, 1965 49 y.o.  Admit date: 10/28/2014 Discharge date: 10/30/2014  Admission Diagnoses:  NSTEMI  Discharge Diagnoses:  Active Problems:   NSTEMI (non-ST elevated myocardial infarction)   CAD (coronary artery disease)   Tobacco abuse   HLD (hyperlipidemia)   Discharged Condition: stable  Hospital Course:   This is a 49yo male with no prior cardiac history but history of GERd who presented to the ER with complaints of chest pain. He was in his USOH until about 4 weeks ago when he started having An intense burning pain under his sternum with a "shuttering wave" up his his chest and into his left arm. He would do breathing exercises that would make it go away after about 10 mionutes. He denied any nausea but would get diaphoretic. This would occur a few times weekly. Today the pain was more intense and he was at work and was instructed to go to the aER via EMS. The pain lasted today for at least 30 minutes to an hour. EMS gave him 4 baby Asa and NTG but the pain had already subsided before he was given the meds. In the ER he developed [erssire his chest that currently has resolved. He denies any SOB, DOE, LE edema, palpitations or syncope. In ER troponin was noted to be elevated.   The patient was admitted and started on IV heparin, statin, ASA.  Beta blocker was not started secondary to mild bradycardia.  His overnight HR was recorded in the upper 40's.  A1C is 5.6. TSH WNL.  See lipid panel below.   He underwent a left heart cath reveal a single, proximal LAD lesion which was treated with a DES.  Brilinta was added.  2D echo revealed an EF of 55-60% with normal wall motion and valves.  The patient will be out of work until 11/11/14.  The patient was seen by Dr. Marlou Porch who felt he was stable for DC home.   Consults: None  Significant Diagnostic Studies:  Lipid Panel       Component Value Date/Time   CHOL 166 10/29/2014 0455   TRIG 51 10/29/2014 0455   HDL 44 10/29/2014 0455   CHOLHDL 3.8 10/29/2014 0455   VLDL 10 10/29/2014 0455   LDLCALC 112* 10/29/2014 0455   Left heart cath Conclusion     Prox LAD to Mid LAD lesion, 80% stenosed. There is a 0% residual stenosis post intervention.  A drug-eluting stent was placed.  The left ventricular systolic function is normal.  Low-normal LV function with small focal region of mild mid anterolateral hypocontractility and an ejection fraction of 50-55%.  Single vessel coronary obstructive disease with a focal 80% proximal tubular LAD stenosis before the takeoff of the first septal and diagonal vessel, which did not significantly improve following IC nitroglycerin administration.  Normal left circumflex and dominant right coronary arteries.  Successful PCI to the proximal LAD with the 80% stenosis being reduced to 0% with ultimate insertion of a 3.015 mm Resolute DES stent postdilated to 3.25 mm.  RECOMMENDATION:  Continue dual antiplatelets therapy for minimum of a year. Aggressive lipid-lowering therapy. Smoking cessation.     Coronary Findings    Dominance: Co-dominant   Left Anterior Descending   . Prox LAD to Mid LAD lesion, 80% stenosed. discrete tubular located proximal to major branch . Mildly improved with IC NTG but still with 70% stenosis.   Marland Kitchen  PCI: The pre-interventional distal flow is normal (TIMI 3). Pre-stent angioplasty was performed. A drug-eluting stent was placed. The strut is apposed. Post-stent angioplasty was performed. The post-interventional distal flow is normal (TIMI 3). The intervention was successful. No complications occurred at this lesion.  . Supplies used: BALLOON TREK IP 2.5X12; STENT RESOLUTE INTEG 3.0X15; BALLOON [place] EMERGE MR [id number]  . There is no residual stenosis post intervention.          Coronary Diagrams    Diagnostic Diagram          Echocardiogram Study Conclusions  - Left ventricle: The cavity size was normal. There was moderate concentric hypertrophy. Systolic function was normal. The estimated ejection fraction was in the range of 55% to 60%. Wall motion was normal; there were no regional wall motion abnormalities. Left ventricular diastolic function parameters were normal. - Aortic valve: Trileaflet; normal thickness leaflets. There was no regurgitation. - Aortic root: The aortic root was normal in size. - Mitral valve: Structurally normal valve. There was no regurgitation. - Right ventricle: The cavity size was normal. Wall thickness was normal. Systolic function was normal. - Right atrium: The atrium was normal in size. - Tricuspid valve: There was no regurgitation. - Pulmonic valve: There was no regurgitation. - Pulmonary arteries: Systolic pressure couldn&'t be assessed as there was no tricuspid regurgitation. - Inferior vena cava: The vessel was normal in size. - Pericardium, extracardiac: There was no pericardial effusion.  Impressions:  - Normal study.   Treatments: See above  Discharge Exam: Blood pressure 145/96, pulse 66, temperature 98 F (36.7 C), temperature source Oral, resp. rate 18, height 5\' 11"  (1.803 m), weight 182 lb 1.6 oz (82.6 kg), SpO2 95 %.  Disposition: 01-Home or Self Care      Discharge Instructions    Amb Referral to Cardiac Rehabilitation    Complete by:  As directed   Congestive Heart Failure: If diagnosis is Heart Failure, patient MUST meet each of the CMS criteria: 1. Left Ventricular Ejection Fraction </= 35% 2. NYHA class II-IV symptoms despite being on optimal heart failure therapy for at least 6 weeks. 3. Stable = have not had a recent (<6 weeks) or planned (<6 months) major cardiovascular hospitalization or procedure  Program Details: - Physician supervised classes - 1-3 classes per week over a 12-18 week period, generally for a  total of 36 sessions  Physician Certification: I certify that the above Cardiac Rehabilitation treatment is medically necessary and is medically approved by me for treatment of this patient. The patient is willing and cooperative, able to ambulate and medically stable to participate in exercise rehabilitation. The participant's progress and Individualized Treatment Plan will be reviewed by the Medical Director, Cardiac Rehab staff and as indicated by the Referring/Ordering Physician.  Diagnosis:   Myocardial Infarction PCI       Diet - low sodium heart healthy    Complete by:  As directed      Discharge instructions    Complete by:  As directed   No lifting more than a half gallon of milk or driving for three days.     Increase activity slowly    Complete by:  As directed             Medication List    TAKE these medications        aspirin 81 MG EC tablet  Take 1 tablet (81 mg total) by mouth daily.  Notes to Patient:  Prevents clotting on stent and heart  attack       *NEW*     atorvastatin 80 MG tablet  Commonly known as:  LIPITOR  Take 1 tablet (80 mg total) by mouth daily at 6 PM.  Notes to Patient:  Cholesterol       *NEW*     fluticasone 50 MCG/ACT nasal spray  Commonly known as:  FLONASE  Place into both nostrils daily.     nitroGLYCERIN 0.4 MG SL tablet  Commonly known as:  NITROSTAT  Place 1 tablet (0.4 mg total) under the tongue every 5 (five) minutes x 3 doses as needed for chest pain.  Notes to Patient:  *NEW*     nystatin cream  Commonly known as:  MYCOSTATIN  Apply 1 application topically 2 (two) times daily.     pantoprazole 40 MG tablet  Commonly known as:  PROTONIX  Take 1 tablet (40 mg total) by mouth daily.  Notes to Patient:  Heartburn/Reflux     ticagrelor 90 MG Tabs tablet  Commonly known as:  BRILINTA  Take 1 tablet (90 mg total) by mouth 2 (two) times daily.  Notes to Patient:  Prevents clotting on stent and heart attack          *NEW*        Follow-up Information    Follow up with Richardson Dopp, PA-C On 11/12/2014.   Specialties:  Physician Assistant, Radiology, Interventional Cardiology   Why:  9:00 AM   Contact information:   [address] [place] [address] [phone]      Greater than 30 minutes was spent completing the patient's discharge.   SignedTarri Fuller, [place] 10/30/2014, 4:37 PM  Personally seen and examined. Agree with above. Mild right groin cath site pain with no signs of hematoma. Normal pulse RLE Ready to go home Prox LAD DES DAPT x 1 year Several questions answered. Watch BP. May need secondary agent in future.   TOC follow up. Out of work 1.5 weeks OK.   Candee Furbish, MD

## 2014-10-30 NOTE — Progress Notes (Signed)
CM spoke with pt regarding Brilinta and provided pt with 30 day free card and copay card which are enclosed in brilinta booklet. Brilinta booklet given to pt per CM. [facility] called by CM and confirmed medication is in stock, CM shared with pt/wife. While sharing with pt/wife CM was told pt has decided to do [facility]. No other needs identified @ present time. Whitman Hero RN,BSN,CM [phone]

## 2014-10-30 NOTE — Discharge Instructions (Signed)
Myocardial Infarction  A myocardial infarction (MI) is damage to the heart that is not reversible. It is also called a heart attack. An MI usually occurs when a heart (coronary) artery becomes blocked or narrowed. This cuts off the blood supply to the heart. When one or more of the heart (coronary) arteries becomes blocked, that area of the heart begins to die. This causes pain felt during an MI.   If you think you might be having an MI, call your local emergency services immediately (911 in [place] It is recommended that you chew and swallow 3 non-enteric coated baby aspirin if you do not have an aspirin allergy. Do not drive yourself to the hospital or wait to see if your symptoms go away. The sooner MI is treated, the greater the amount of heart muscle saved. Time is muscle. It can save your life.  CAUSES   An MI can occur from:  · A gradual buildup of a fatty substance called plaque. When plaque builds up in the arteries, this condition is called atherosclerosis. This buildup can block or reduce the blood supply to the heart artery(s).  · A sudden plaque rupture within a heart artery that causes a blood clot (thrombus). A blood clot can block the heart artery which does not allow blood flow to the heart.  · A severe tightening (spasm) of the heart artery. This is a less common cause of a heart attack. When a heart artery spasms, it cuts off blood flow through the artery. Spasms can occur in heart arteries that do not have atherosclerosis.  RISK FACTORS  People at risk for an MI usually have one or more risk factors, such as:  · High blood pressure.  · High cholesterol.  · Smoking.  · Gender. Men have a higher heart attack risk.  · Overweight/obesity.  · Age.  · Family history.  · Lack of exercise.  · Diabetes.  · Stress.  · Excessive alcohol use.  · Street drug use (cocaine and methamphetamines).  SYMPTOMS   MI symptoms can vary, such as:  · In both men and women, MI symptoms can include the following:  ¨ Chest  pain. The chest pain may feel like a crushing, squeezing, or "pressure" type feeling. MI pain can be "referred," meaning pain can be caused in one part of the body but felt in another part of the body. Referred MI pain may occur in the left arm, neck, or jaw. Pain may even be felt in the right arm.  ¨ Shortness of breath (dyspnea).  ¨ Heartburn or indigestion with or without vomiting, shortness of breath, or sweating (diaphoresis).  ¨ Sudden, cold sweats.  ¨ Sudden lightheadedness.  ¨ Upper back pain.  · Women can have unique MI symptoms, such as:  ¨ Unexplained feelings of nervousness or anxiety.  ¨ Discomfort between the shoulder blades (scapula) or upper back.  ¨ Tingling in the hands and arms.  · In elderly people (regardless of gender), MI symptoms can be subtle, such as:  ¨ Sweating (diaphoresis).  ¨ Shortness of breath (dyspnea).  ¨ General tiredness (fatigue) or not feeling well (malaise).  DIAGNOSIS   Diagnosis of an MI involves several tests such as:  · An assessment of your vital signs such as heart rhythm, blood pressure, respiratory rate, and oxygen level.  · An EKG (ECG) to look at the electrical activity of your heart.  · Blood tests called cardiac markers are drawn at scheduled times to measure proteins   or enzymes released by the damaged heart muscle.  · A chest X-ray.  · An echocardiogram to evaluate heart motion and blood flow.  · Coronary angiography (cardiac catheterization). This is a diagnostic procedure to look at the heart arteries.  TREATMENT   Acute Intervention. For an MI, the national standard in the [place] is to have an acute intervention in under 90 minutes from the time you get to the hospital. An acute intervention is a special procedure to open up the heart arteries. It is done in a treatment room called a "catheterization lab" (cath lab). Some hospitals do no have a cath lab. If you are having an MI and the hospital does not have a cath lab, the standard is to transport you  to a hospital that has one. In the cath lab, acute intervention includes:  · Angioplasty. An angioplasty involves inserting a thin, flexible tube (catheter) into an artery in either your groin or wrist. The catheter is threaded to the heart arteries. A balloon at the end of the catheter is inflated to open a narrowed or blocked heart artery. During an angioplasty procedure, a small mesh tube (stent) may be used to keep the heart artery open. Depending on your condition and health history, one of two types of stents may be placed:  ¨ Drug-eluting stent (DES). A DES is coated with a medicine to prevent scar tissue from growing over the stent. With drug-eluting stents, blood thinning medicine will need to be taken for up to a year.  ¨ Bare metal stent. This type of stent has no special coating to keep tissue from growing over it. This type of stent is used if you cannot take blood thinning medicine for a prolonged time or you need surgery in the near future. After a bare metal stent is placed, blood thinning medicine will need to be taken for about a month.  ¨ If you are taking blood thinning medicine (anti-platelet therapy) after stent placement, do not stop taking it unless your caregiver says it is okay to do so. Make sure you understand how long you need to take the medicine.  Surgical Intervention  · If an acute intervention is not successful, surgery may be needed:  ¨ Open heart surgery (coronary artery bypass graft, CABG). CABG takes a vein (saphenous vein) from your leg. The vein is then attached to the blocked heart artery which bypasses the blockage. This then allows blood flow to the heart muscle.  Additional Interventions  · A "clot buster" medicine (thrombolytic) may be given. This medicine can help break up a clot in the heart artery. This medicine may be given if a person cannot get to a cath lab right away.  · Intra-aortic balloon pump (IABP). If you have suffered a very severe MI and are too unstable  to go to the cath lab or to surgery, an IABP may be used. This is a temporary mechanical device used to increase blood flow to the heart and reduce the workload of the heart until you are stable enough to go to the cath lab or surgery.  HOME CARE INSTRUCTIONS  After an MI, you may need the following:  · Medicine. Take medicine as directed by your caregiver. Medicines after an MI may:  ¨ Keep your blood from clotting easily (blood thinners).  ¨ Control your blood pressure.  ¨ Help lower your cholesterol.  ¨ Control abnormal heart rhythms.  · Lifestyle changes. Under the guidance of your caregiver, lifestyle   changes include:  ¨ Quitting smoking, if you smoke. Your caregiver can help you quit.  ¨ Being physically active.  ¨ Maintaining a healthy weight.  ¨ Eating a heart healthy diet. A dietitian can help you learn healthy eating options.  ¨ Managing diabetes.  ¨ Reducing stress.  ¨ Limiting alcohol intake.  SEEK IMMEDIATE MEDICAL CARE IF:   · You have severe chest pain, especially if the pain is crushing or pressure-like and spreads to the arms, back, neck, or jaw. This is an emergency. Do not wait to see if the pain will go away. Get medical help at once. Call your local emergency services (911 in the [place] Do not drive yourself to the hospital.  · You have shortness of breath during rest, sleep, or with activity.  · You have sudden sweating or clammy skin.  · You feel sick to your stomach (nauseous) and throw up (vomit).  · You suddenly become lightheaded or dizzy.  · You feel your heart beating rapidly or you notice "skipped" beats.  MAKE SURE YOU:   · Understand these instructions.  · Will watch your condition.  · Will get help right away if you are not doing well or get worse.  Document Released: 03/15/2005 Document Revised: 03/20/2013 Document Reviewed: 05/18/2013  Exit[employer]® Patient Information ©2015 [employer]. This information is not intended to replace advice given to you by your health care provider.  Make sure you discuss any questions you have with your health care provider.

## 2014-10-30 NOTE — Progress Notes (Signed)
*  PRELIMINARY RESULTS* Echocardiogram 2D Echocardiogram has been performed.  Leavy Cella 10/30/2014, 9:57 AM

## 2014-10-30 NOTE — Progress Notes (Addendum)
CARDIAC REHAB PHASE I   PRE:  Rate/Rhythm: 62 sR  BP:  Sitting: 145/96        SaO2: 97 rA  MODE:  Ambulation: 1000 ft   POST:  Rate/Rhythm: 72 sR  BP:  Sitting: 149/93         SaO2: 99 RA  Pt lying in bed, c/o of mild discomfort in his groin, concerned about his blood pressure being high. Pt ambulated 1000 ft on RA, independent, steady gait, tolerated well.  Pt denies DOE, cp, dizziness, declined rest stop. Completed MI/stent education with pt wife at bedside.  Reviewed risk factors, tobacco cessation, MI book, anti-platelet therapy, stent card, activity restrictions, ntg, exercise, heart healthy diet, portion control and  phase 2 cardiac rehab. Pt verbalized understanding. Pt agrees to phase 2 cardiac rehab. Will send referral [facility].  Pt has very flat affect, states he understands what he has to do, but that this will be a "major lifestyle change" and he is "overwhelmed"  Emotional support given to pt. Pt in bed after walk per pt request, call bell within reach.   [phone]     Lenna Sciara, RN, BSN 10/30/2014 9:11 AM

## 2014-10-30 NOTE — Progress Notes (Signed)
Subjective: Right groin is tender.  Objective: Vital signs in last 24 hours: Temp:  [97.7 F (36.5 C)-98.5 F (36.9 C)] 98.3 F (36.8 C) (08/03 0625) Pulse Rate:  [38-75] 61 (08/03 0625) Resp:  [4-90] 18 (08/03 0625) BP: (114-151)/(79-117) 143/90 mmHg (08/03 0625) SpO2:  [95 %-100 %] 95 % (08/03 0625) Weight:  [182 lb 1.6 oz (82.6 kg)] 182 lb 1.6 oz (82.6 kg) (08/03 0028) Last BM Date: 10/29/14  Intake/Output from previous day: 08/02 0701 - 08/03 0700 In: 2645 [P.O.:720; I.V.:1925] Out: 2850 [Urine:2850] Intake/Output this shift:    Medications Scheduled Meds: . aspirin EC  81 mg Oral Daily  . atorvastatin  80 mg Oral q1800  . pantoprazole  40 mg Oral Daily  . pneumococcal 23 valent vaccine  0.5 mL Intramuscular Tomorrow-1000  . sodium chloride  3 mL Intravenous Q12H  . ticagrelor  90 mg Oral BID   Continuous Infusions: . sodium chloride Stopped (10/30/14 0600)   PRN Meds:.sodium chloride, acetaminophen, diazepam, fentaNYL (SUBLIMAZE) injection, nitroGLYCERIN, ondansetron (ZOFRAN) IV, sodium chloride  PE: General appearance: alert, cooperative and no distress Lungs: clear to auscultation bilaterally Heart: regular rate and rhythm, S1, S2 normal, no murmur, click, rub or gallop Extremities: No LEE Pulses: 2+ and symmetric Skin: Warm and dry.  right groin is tender.  No hematoma or ecchymosis.   Neurologic: Grossly normal  Lab Results:   Recent Labs  10/28/14 2245 10/29/14 0455 10/30/14 0349  WBC 7.8 7.6 7.6  HGB 14.8 14.7 14.4  HCT 42.7 42.3 41.3  PLT 195 197 201   BMET  Recent Labs  10/28/14 2245 10/29/14 0455 10/30/14 0349  NA 139 138 138  K 3.6 3.6 3.7  CL 108 105 110  CO2 24 24 23   GLUCOSE 110* 88 110*  BUN 20 19 16   CREATININE 0.98 1.06 0.97  CALCIUM 8.5* 8.3* 8.0*   PT/INR  Recent Labs  10/28/14 2245 10/29/14 0455  LABPROT 14.2 14.1  INR 1.08 1.07   Cholesterol  Recent Labs  10/29/14 0455  CHOL 166   Lipid Panel    Component Value Date/Time   CHOL 166 10/29/2014 0455   TRIG 51 10/29/2014 0455   HDL 44 10/29/2014 0455   CHOLHDL 3.8 10/29/2014 0455   VLDL 10 10/29/2014 0455   LDLCALC 112* 10/29/2014 0455   Cardiac Panel (last 3 results)  Recent Labs  10/29/14 0455 10/29/14 1009 10/29/14 1510  TROPONINI 0.04* <0.03 <0.03   Conclusion     Prox LAD to Mid LAD lesion, 80% stenosed. There is a 0% residual stenosis post intervention.  A drug-eluting stent was placed.  The left ventricular systolic function is normal.  Low-normal LV function with small focal region of mild mid anterolateral hypocontractility and an ejection fraction of 50-55%.  Single vessel coronary obstructive disease with a focal 80% proximal tubular LAD stenosis before the takeoff of the first septal and diagonal vessel, which did not significantly improve following IC nitroglycerin administration.  Normal left circumflex and dominant right coronary arteries.  Successful PCI to the proximal LAD with the 80% stenosis being reduced to 0% with ultimate insertion of a 3.015 mm Resolute DES stent postdilated to 3.25 mm.      Assessment/Plan  Active Problems:   NSTEMI (non-ST elevated myocardial infarction)  Tobacco abuse   HDL   CAD   Depression  SP Left heart cath revealing Prox LAD to Mid LAD lesion, 80% stenosed. He underwent DES placement with  a 0% residual  stenosis post intervention.   Low-normal LV function with small focal region of mild mid anterolateral hypocontractility and an ejection fraction of 50-55%.  ASA, brilinta, high dose statin.  His lipid panel looks pretty good.  We can probably lower the dose later on.  TG and HDL are excellent.  HR gets into the upper 40's at night.  No beta blocker at this time.  Smoking cessation discussed.  Will prescribe patches to try first.   Groin is tender but no hematoma, ecchymosis or bruit.  DC home today.   LOS: 2 days    HAGER, BRYAN PA-C 10/30/2014 7:51  AM  Personally seen and examined. Agree with above. Mild right groin cath site pain with no signs of hematoma. Normal pulse RLE Ready to go home Prox LAD DES DAPT x 1 year Several questions answered. Watch BP. May need secondary agent in future.   TOC follow up. Out of work 1.5 weeks OK.   Candee Furbish, MD

## 2014-10-31 ENCOUNTER — Other Ambulatory Visit: Payer: Self-pay | Admitting: *Deleted

## 2014-10-31 ENCOUNTER — Other Ambulatory Visit: Payer: Self-pay | Admitting: Pulmonary Disease

## 2014-10-31 DIAGNOSIS — G4733 Obstructive sleep apnea (adult) (pediatric): Secondary | ICD-10-CM

## 2014-10-31 MED ORDER — AMBULATORY NON FORMULARY MEDICATION: 81.0000 mg | Freq: Every day | Status: DC

## 2014-10-31 MED ORDER — AMBULATORY NON FORMULARY MEDICATION: 90.0000 mg | Freq: Two times a day (BID) | Status: DC

## 2014-11-04 ENCOUNTER — Ambulatory Visit (INDEPENDENT_AMBULATORY_CARE_PROVIDER_SITE_OTHER): Payer: 59 | Admitting: Pulmonary Disease

## 2014-11-04 ENCOUNTER — Encounter: Payer: Self-pay | Admitting: Pulmonary Disease

## 2014-11-04 VITALS — BP 110/72 | HR 70 | Ht 71.0 in | Wt 187.2 lb

## 2014-11-04 DIAGNOSIS — G4733 Obstructive sleep apnea (adult) (pediatric): Secondary | ICD-10-CM | POA: Diagnosis not present

## 2014-11-04 DIAGNOSIS — Z720 Tobacco use: Secondary | ICD-10-CM | POA: Diagnosis not present

## 2014-11-04 NOTE — Patient Instructions (Addendum)
Check CPAP download Call [phone] -sleep lab - for nasal mask fitting session - when ready Congratulations on quitting smoking !!

## 2014-11-04 NOTE — Assessment & Plan Note (Addendum)
Check CPAP download Call [phone] -sleep lab - for nasal mask fitting session - when ready Can trial nasal mask + chin strap  compliance with goal of at least 4-6 hrs every night is the expectation. Advised against medications with sedative side effects Cautioned against driving when sleepy - understanding that sleepiness will vary on a day to day basis

## 2014-11-04 NOTE — Progress Notes (Signed)
   Subjective:    [name], male    DOB: 1965-10-25, 49 y.o.   MRN: [id number]  HPI  49 year old male  for FU of obstructive sleep apnea. He  moved here from [facility],  He is on the automatic setting with a pressure range of 6-20 cm of water.   11/04/2014  Chief Complaint  Patient presents with  . Sleep Apnea    discuss upgrading equipment, get mask fitting (soft pillow masks).  has been wearing a large mask until he received a new mask.  The mask was not working, so we didn't get much for a download until he got his new mask.       Set up with  [facility] after last visit - had problems with supplies He has FF mask, since mouth breather but wonders about a  nasal LAD stent  - on brilinta (on research study) Quit smoking  Overall, he feels that he sleeps fairly well with the device, although has an occasional morning that he is not rested. He is satisfied with his alertness during the day, and his Epworth score today is 3. The patient states that his weight is completely stable from his original sleep study.   CXR reviewed -c lear 05/2011 HST - AHI 38/h   Review of Systems neg for any significant sore throat, dysphagia, itching, sneezing, nasal congestion or excess/ purulent secretions, fever, chills, sweats, unintended wt loss, pleuritic or exertional cp, hempoptysis, orthopnea pnd or change in chronic leg swelling. Also denies presyncope, palpitations, heartburn, abdominal pain, nausea, vomiting, diarrhea or change in bowel or urinary habits, dysuria,hematuria, rash, arthralgias, visual complaints, headache, numbness weakness or ataxia.     Objective:   Physical Exam  Gen. Pleasant, well-nourished, in no distress ENT - no lesions, no post nasal drip Neck: No JVD, no thyromegaly, no carotid bruits Lungs: no use of accessory muscles, no dullness to percussion, clear without rales or rhonchi  Cardiovascular: Rhythm regular, heart sounds  normal, no murmurs or  gallops, no peripheral edema Musculoskeletal: No deformities, no cyanosis or clubbing          Assessment & Plan:

## 2014-11-06 NOTE — Assessment & Plan Note (Signed)
Smoking cessation encouraged!

## 2014-11-11 NOTE — Progress Notes (Signed)
Cardiology Office Note   Date:  11/12/2014   ID:  Dorrien Grunder, DOB 09/02/1965, MRN [id number]  PCP:  Mauricio Po, FNP  Cardiologist:  Dr. [name]   Electrophysiologist:  n/a  Chief Complaint  Patient presents with  . Hospitalization Follow-up    s/p NSTEMI  . Coronary Artery Disease     History of Present Illness: Isaac Patel is a 50 y.o. male who was admitted 8/1-8/3 with a NSTEMI.  LHC demonstrated 80% proximal LAD stenosis that was treated with a Resolute DES.  Echo demonstrated normal LVF.  He returns for FU.    He is here today with his wife. He has a lot of different complaints today. The symptoms that were consistent with his angina that he presented to the hospital with have all resolved. However, he notes nasal congestion, nausea and pressure in his chest. When I ask him what brings these symptoms on or what helps to alleviate them, he is not really able to elaborate. He seems frustrated at times. His wife tells me that he is overwhelmed by his entire diagnosis. He asks several questions about vascular disease in other parts of his body. He's concerned that his coronary artery disease is going to snowball into many other problems. Of note, he has quit smoking. He seems to be anxious from this. He denies syncope. He denies exertional chest pain or shortness of breath. He does have dyspnea at different times. It sounds as though he is having side effects to Brilinta. He sleeps with CPAP at night. He denies orthopnea or edema.   Studies/Reports Reviewed Today:  Echo 10/30/14 Moderate LVH, EF 55-60%, normal wall motion, normal diastolic function, normal RV function   LHC 10/29/14 LAD: Proximal-mid 80% EF 50-55% with small focal region of mild mid anterolateral hypokinesis PCI: 3 x 15 mm resolute DES to the LAD  Low-normal LV function with small focal region of mild mid anterolateral hypocontractility and an ejection fraction of 50-55%. Single vessel coronary  obstructive disease with a focal 80% proximal tubular LAD stenosis before the takeoff of the first septal and diagonal vessel, which did not significantly improve following IC nitroglycerin administration. Normal left circumflex and dominant right coronary arteries. Successful PCI to the proximal LAD with the 80% stenosis being reduced to 0% with ultimate insertion of a 3.015 mm Resolute DES stent postdilated to 3.25 mm. RECOMMENDATION:  Continue dual antiplatelets therapy for minimum of a year. Aggressive lipid-lowering therapy. Smoking cessation          Past Medical History  Diagnosis Date  . GERD (gastroesophageal reflux disease)   . NSTEMI (non-ST elevated myocardial infarction) 10/28/2014  . OSA (obstructive sleep apnea)     "tried mask; wear it off and on" (10/28/2014)  . Headache     "maybe monthly" (10/28/2014)  . Depression     hx  . CAD (coronary artery disease)     a.  Non-STEMI 8/16: LHC-proximal LAD 80% treated with a resolute DES, EF 50-55%;  b.  Echo 8/16:  Moderate LVH, EF 55-60%, normal wall motion, normal diastolic function, normal RV function  . HLD (hyperlipidemia)     Past Surgical History  Procedure Laterality Date  . 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 Tympanostomy tube placement Bilateral "as a kid"    "had one done as an adult too"  . Cholesteatoma excision Right 1990's  . Cardiac catheterization N/A 10/29/2014    Procedure: Left Heart Cath and  Coronary Angiography;  Surgeon: Troy Sine, MD;  Location: [facility];  Service: Cardiovascular;  Laterality: N/A;     Current Outpatient Prescriptions  Medication Sig Dispense Refill  . AMBULATORY NON FORMULARY MEDICATION Take 90 mg by mouth 2 (two) times daily. Medication Name: Brilinta 90 mg BID (Provided by [employer] Research Study* Do NOT Fill)    . AMBULATORY NON FORMULARY MEDICATION Take 81 mg by mouth daily. Medication Name: ASA 81 mg daily (Provided by [employer]  Research study)    . atorvastatin (LIPITOR) 80 MG tablet Take 1 tablet (80 mg total) by mouth daily at 6 PM. 30 tablet 11  . fluticasone (FLONASE) 50 MCG/ACT nasal spray Place into both nostrils daily.    . nitroGLYCERIN (NITROSTAT) 0.4 MG SL tablet Place 1 tablet (0.4 mg total) under the tongue every 5 (five) minutes x 3 doses as needed for chest pain. 25 tablet 12  . nystatin cream (MYCOSTATIN) Apply 1 application topically 2 (two) times daily. (Patient taking differently: Apply 1 application topically 2 (two) times daily as needed (as needed for rash). ) 30 g 0  . pantoprazole (PROTONIX) 40 MG tablet Take 1 tablet (40 mg total) by mouth daily. 90 tablet 3   No current facility-administered medications for this visit.    Allergies:   Morphine and related    Social History:  The patient  reports that he has been smoking Cigarettes.  He has a 32 pack-year smoking history. He has never used smokeless tobacco. He reports that he drinks about 9.6 - 10.2 oz of alcohol per week. He reports that he uses illicit drugs (Marijuana).   Family History:  The patient's family history includes Healthy in his father and mother; Multiple sclerosis in his maternal grandfather.    ROS:   Please see the history of present illness.   Review of Systems  Constitution: Positive for malaise/fatigue.  Cardiovascular: Positive for dyspnea on exertion.  Gastrointestinal: Positive for nausea.  Neurological: Positive for dizziness.  Psychiatric/Behavioral: The patient is nervous/anxious.   All other systems reviewed and are negative.     PHYSICAL EXAM: VS:  BP 130/80 mmHg  Pulse 67  Ht 5\' 11"  (1.803 m)  Wt 186 lb 9.6 oz (84.641 kg)  BMI 26.04 kg/m2  SpO2 98%    Wt Readings from Last 3 Encounters:  11/12/14 186 lb 9.6 oz (84.641 kg)  11/04/14 187 lb 3.2 oz (84.913 kg)  10/30/14 182 lb 1.6 oz (82.6 kg)     GEN: Well nourished, well developed, in no acute distress HEENT: normal Neck: no JVD, no carotid  bruits, no masses Cardiac:  Normal S1/S2, RRR; no murmur ,  no rubs or gallops, no edema; R groin without hematoma or bruit  Respiratory:  clear to auscultation bilaterally, no wheezing, rhonchi or rales. GI: soft, nontender, nondistended, + BS MS: no deformity or atrophy Skin: warm and dry  Neuro:  CNs II-XII intact, Strength and sensation are intact Psych: Normal affect   EKG:  EKG is ordered today.  It demonstrates:   NSR, HR 67, normal axis   Recent Labs: 10/28/2014: ALT 16*; Magnesium 2.0; TSH 2.301 11/12/2014: BUN 16; Creatinine, Ser 1.04; Hemoglobin 15.7; Platelets 264.0; Potassium 4.2; Sodium 139    Lipid Panel    Component Value Date/Time   CHOL 166 10/29/2014 0455   TRIG 51 10/29/2014 0455   HDL 44 10/29/2014 0455   CHOLHDL 3.8 10/29/2014 0455   VLDL 10 10/29/2014 0455   LDLCALC 112*  10/29/2014 0455      ASSESSMENT AND PLAN:  Coronary artery disease:  Status post non-STEMI treated with a DES to the LAD. EF overall preserved. The patient is having a lot of different symptoms that he reports today that I think are probably related to anxiety. He is having chest discomfort. He had single-vessel disease. His ECG today is normal. We discussed what it would look like if he had acute stent thrombosis. He is certainly having no symptoms that would suggest that. The fact that his ECG is normal is very reassuring. I did try to reassure him today. He has been compliant with Brilinta and aspirin. He is enrolled in the [place] study. He may be having some side effects to Brilinta. I would prefer that he continue on aspirin and Brilinta for now. If he gets out 30 days from his MI and is still having symptoms of dyspnea, we will likely need to change him to Plavix. Continue statin. He did have lower heart ates in the hospital and is not on beta blocker therapy. I will refer him to cardiac rehabilitation. I have given him a note to return to work. 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 I have asked him to not lift over 40 pounds for the next 3 months.  HLD (hyperlipidemia):  Continue statin. We will need to arrange lipids and LFTs at follow-up visit.  Tobacco abuse:  He has quit smoking.  Anxiety:  I have suggested that he see his primary care provider this week for further evaluation and management. He could consider something like Celexa or Effexor to help him with his symptoms. He would prefer to remain off of medications. I have suggested that he look into the employee assistance program at [facility].  Shortness of breath:  This is likely a side effect of Brilinta. I have encouraged him to remain on Brilinta for now. If his symptoms do not improve over the next couple of weeks, consider changing Brilinta to Plavix.     Medication Changes: Current medicines are reviewed at length with the patient today.  Concerns regarding medicines are as outlined above.  The following changes have been made:   Discontinued Medications   No medications on file   Modified Medications   No medications on file   New Prescriptions   No medications on file     Labs/ tests ordered today include:   Orders Placed This Encounter  Procedures  . Basic Metabolic Panel (BMET)  . CBC w/Diff  . EKG 12-Lead     Disposition:    FU with Dr. Radford Pax or me in 2 weeks   Signed, Isaac Patel, MHS 11/12/2014 4:54 PM    [address], [place], [place]  [address] Phone: [phone]; Fax: [phone]

## 2014-11-12 ENCOUNTER — Ambulatory Visit (INDEPENDENT_AMBULATORY_CARE_PROVIDER_SITE_OTHER): Payer: 59 | Admitting: Physician Assistant

## 2014-11-12 ENCOUNTER — Encounter: Payer: Self-pay | Admitting: Physician Assistant

## 2014-11-12 VITALS — BP 130/80 | HR 67 | Ht 71.0 in | Wt 186.6 lb

## 2014-11-12 DIAGNOSIS — E785 Hyperlipidemia, unspecified: Secondary | ICD-10-CM

## 2014-11-12 DIAGNOSIS — I2510 Atherosclerotic heart disease of native coronary artery without angina pectoris: Secondary | ICD-10-CM

## 2014-11-12 DIAGNOSIS — Z720 Tobacco use: Secondary | ICD-10-CM

## 2014-11-12 DIAGNOSIS — F419 Anxiety disorder, unspecified: Secondary | ICD-10-CM

## 2014-11-12 DIAGNOSIS — R0602 Shortness of breath: Secondary | ICD-10-CM

## 2014-11-12 LAB — BASIC METABOLIC PANEL
BUN: 16 mg/dL (ref 6–23)
CALCIUM: 9.1 mg/dL (ref 8.4–10.5)
CO2: 28 mEq/L (ref 19–32)
Chloride: 105 mEq/L (ref 96–112)
Creatinine, Ser: 1.04 mg/dL (ref 0.40–1.50)
GFR: 80.37 mL/min (ref >60.00)
GLUCOSE: 109 mg/dL — AB (ref 70–99)
Potassium: 4.2 mEq/L (ref 3.5–5.1)
SODIUM: 139 meq/L (ref 135–145)

## 2014-11-12 LAB — CBC WITH DIFFERENTIAL/PLATELET
BASOS ABS: 0 10*3/uL (ref 0.0–0.1)
Basophils Relative: 0.6 % (ref 0.0–3.0)
Eosinophils Absolute: 0.5 10*3/uL (ref 0.0–0.7)
Eosinophils Relative: 6.6 % — ABNORMAL HIGH (ref 0.0–5.0)
HCT: 45.9 % (ref 39.0–52.0)
Hemoglobin: 15.7 g/dL (ref 13.0–17.0)
LYMPHS ABS: 2 10*3/uL (ref 0.7–4.0)
Lymphocytes Relative: 27 % (ref 12.0–46.0)
MCHC: 34.3 g/dL (ref 30.0–36.0)
MCV: 95.3 fl (ref 78.0–100.0)
Monocytes Absolute: 0.7 10*3/uL (ref 0.1–1.0)
Monocytes Relative: 9.3 % (ref 3.0–12.0)
NEUTROS PCT: 56.5 % (ref 43.0–77.0)
Neutro Abs: 4.2 10*3/uL (ref 1.4–7.7)
Platelets: 264 10*3/uL (ref 150.0–400.0)
RBC: 4.81 Mil/uL (ref 4.22–5.81)
RDW: 13.1 % (ref 11.5–15.5)
WBC: 7.4 10*3/uL (ref 4.0–10.5)

## 2014-11-12 NOTE — Patient Instructions (Signed)
Medication Instructions:  Your physician recommends that you continue on your current medications as directed. Please refer to the Current Medication list given to you today.   Labwork: TODAY BMET, CBC W/DIFF  Testing/Procedures: NONE  Follow-Up: 1. SCOTT WEAVER, [facility] 11/25/14 @ 3 PM SAME DAY DR. Radford Pax IS IN THE OFFICE  2. YOU HAVE A FOLLOW UP 11/15/14 3:30 PM WITH GREGORY CALONE  Any Other Special Instructions Will Be Listed Below (If Applicable). 1. PER SCOTT WEAVER, PAC OK TO RETURN TO WORK  2. CARDIAC REHAB AT [facility]

## 2014-11-13 ENCOUNTER — Telehealth: Payer: Self-pay | Admitting: *Deleted

## 2014-11-13 NOTE — Telephone Encounter (Signed)
Ptcb and has been notified of lab results by phone with verbal understanding. Pt asked about when he should hear from Cardiac Rehab; Advised may be about a week or so, pt said ok and thank you.

## 2014-11-15 ENCOUNTER — Encounter: Payer: Self-pay | Admitting: Family

## 2014-11-15 ENCOUNTER — Ambulatory Visit (INDEPENDENT_AMBULATORY_CARE_PROVIDER_SITE_OTHER): Payer: 59 | Admitting: Family

## 2014-11-15 VITALS — BP 122/86 | HR 64 | Temp 98.0°F | Resp 18 | Ht 71.0 in | Wt 189.0 lb

## 2014-11-15 DIAGNOSIS — F411 Generalized anxiety disorder: Secondary | ICD-10-CM

## 2014-11-15 DIAGNOSIS — R1013 Epigastric pain: Secondary | ICD-10-CM | POA: Diagnosis not present

## 2014-11-15 MED ORDER — CLONAZEPAM 0.5 MG PO TABS: 0.5000 mg | ORAL_TABLET | Freq: Two times a day (BID) | ORAL | Status: DC | PRN

## 2014-11-15 NOTE — Patient Instructions (Signed)
Generalized Anxiety Disorder Generalized anxiety disorder (GAD) is a mental disorder. It interferes with life functions, including relationships, work, and school. GAD is different from normal anxiety, which everyone experiences at some point in their lives in response to specific life events and activities. Normal anxiety actually helps [place] prepare for and get through these life events and activities. Normal anxiety goes away after the event or activity is over.  GAD causes anxiety that is not necessarily related to specific events or activities. It also causes excess anxiety in proportion to specific events or activities. The anxiety associated with GAD is also difficult to control. GAD can vary from mild to severe. People with severe GAD can have intense waves of anxiety with physical symptoms (panic attacks).  SYMPTOMS The anxiety and worry associated with GAD are difficult to control. This anxiety and worry are related to many life events and activities and also occur more days than not for 6 months or longer. People with GAD also have three or more of the following symptoms (one or more in children):  Restlessness.   Fatigue.  Difficulty concentrating.   Irritability.  Muscle tension.  Difficulty sleeping or unsatisfying sleep. DIAGNOSIS GAD is diagnosed through an assessment by your health care provider. Your health care provider will ask you questions aboutyour mood,physical symptoms, and events in your life. Your health care provider may ask you about your medical history and use of alcohol or drugs, including prescription medicines. Your health care provider may also do a physical exam and blood tests. Certain medical conditions and the use of certain substances can cause symptoms similar to those associated with GAD. Your health care provider may refer you to a mental health specialist for further evaluation. TREATMENT The following therapies are usually used to treat GAD:    Medication. Antidepressant medication usually is prescribed for long-term daily control. Antianxiety medicines may be added in severe cases, especially when panic attacks occur.   Talk therapy (psychotherapy). Certain types of talk therapy can be helpful in treating GAD by providing support, education, and guidance. A form of talk therapy called cognitive behavioral therapy can teach you healthy ways to think about and react to daily life events and activities.  Stress managementtechniques. These include yoga, meditation, and exercise and can be very helpful when they are practiced regularly. A mental health specialist can help determine which treatment is best for you. Some people see improvement with one therapy. However, other people require a combination of therapies. Document Released: 07/10/2012 Document Revised: 07/30/2013 Document Reviewed: 07/10/2012 [facility] Patient Information 2015 [place], [place]. This information is not intended to replace advice given to you by your health care provider. Make sure you discuss any questions you have with your health care provider. Thank you for choosing [facility].  Summary/Instructions:  Your prescription(s) have been submitted to your pharmacy or been printed and provided for you. Please take as directed and contact our office if you believe you are having problem(s) with the medication(s) or have any questions.  If your symptoms worsen or fail to improve, please contact our office for further instruction, or in case of emergency go directly to the emergency room at the closest medical facility.

## 2014-11-15 NOTE — Progress Notes (Signed)
Pre visit review using our clinic review tool, if applicable. No additional management support is needed unless otherwise documented below in the visit note. 

## 2014-11-15 NOTE — Assessment & Plan Note (Signed)
Dyspepsia currently maintained with Protonix. Previously had several endoscopies and colonoscopies. Concern for his GI system. Would like to be referred to gastroenterology for further evaluation. Referral to gastroneurology placed.

## 2014-11-15 NOTE — Progress Notes (Signed)
Subjective:    [name], male    DOB: 1966-03-17, 49 y.o.   MRN: [id number]  Chief Complaint  Patient presents with  . Anxiety    states that he had a heart attack a few weeks ago and does have some anxiety due to lifestyle changes, also wants to talk about colonsocopy and etc    HPI:  Isaac Patel is a 49 y.o. male with a PMH of an STEMI, coronary artery disease, sleep apnea, and hyperlipidemia who presents today for an acute office visit.   1.) Anxiety - This is a new problem. Associated symptom of increased anxiety has been going on for the past 3 weeks since having a heart attack. Was recently treated for an NSTEMI. Since his NSTEMI he has quit smoking and noted that he has become overwhelmed with the dietary changes. Modifying factors include getting out of the situation and taking a deep breath which has helped a little. He does have concerns of what could be possibly going on with the rest of his body. The severity of the anxiety is enough to effect his everyday lifestyle. Reports some problems sleeping.  2.) Reflux  - Would like to be established with a gastroenterologist. Previously has had colonoscopy and endoscopy before and originally though he was simply having heartburn.  He still continues to experience some feelings of indigestion. Currently maintained on pantoprazole which indicates mildly controlled the symptoms.  Allergies  Allergen Reactions  . Morphine And Related Itching    Current Outpatient Prescriptions on File Prior to Visit  Medication Sig Dispense Refill  . AMBULATORY NON FORMULARY MEDICATION Take 90 mg by mouth 2 (two) times daily. Medication Name: Brilinta 90 mg BID (Provided by [employer] Research Study* Do NOT Fill)    . AMBULATORY NON FORMULARY MEDICATION Take 81 mg by mouth daily. Medication Name: ASA 81 mg daily (Provided by [employer] Research study)    . atorvastatin (LIPITOR) 80 MG tablet Take 1 tablet (80 mg total) by mouth daily  at 6 PM. 30 tablet 11  . fluticasone (FLONASE) 50 MCG/ACT nasal spray Place into both nostrils daily.    . nitroGLYCERIN (NITROSTAT) 0.4 MG SL tablet Place 1 tablet (0.4 mg total) under the tongue every 5 (five) minutes x 3 doses as needed for chest pain. 25 tablet 12  . nystatin cream (MYCOSTATIN) Apply 1 application topically 2 (two) times daily. (Patient taking differently: Apply 1 application topically 2 (two) times daily as needed (as needed for rash). ) 30 g 0  . pantoprazole (PROTONIX) 40 MG tablet Take 1 tablet (40 mg total) by mouth daily. 90 tablet 3   No current facility-administered medications on file prior to visit.    Past Medical History  Diagnosis Date  . GERD (gastroesophageal reflux disease)   . NSTEMI (non-ST elevated myocardial infarction) 10/28/2014  . OSA (obstructive sleep apnea)     "tried mask; wear it off and on" (10/28/2014)  . Headache     "maybe monthly" (10/28/2014)  . Depression     hx  . CAD (coronary artery disease)     a.  Non-STEMI 8/16: LHC-proximal LAD 80% treated with a resolute DES, EF 50-55%;  b.  Echo 8/16:  Moderate LVH, EF 55-60%, normal wall motion, normal diastolic function, normal RV function  . HLD (hyperlipidemia)      Review of Systems  Constitutional: Negative for fever and chills.  Gastrointestinal: Negative for nausea, vomiting, abdominal pain, diarrhea and constipation.  Psychiatric/Behavioral: The  patient is nervous/anxious.       Objective:    BP 122/86 mmHg  Pulse 64  Temp(Src) 98 F (36.7 C) (Oral)  Resp 18  Ht 5\' 11"  (1.803 m)  Wt 189 lb (85.73 kg)  BMI 26.37 kg/m2  SpO2 99% Nursing note and vital signs reviewed.  Physical Exam  Constitutional: He is oriented to person, place, and time. He appears well-developed and well-nourished. No distress.  Cardiovascular: Normal rate, regular rhythm, normal heart sounds and intact distal pulses.   Pulmonary/Chest: Effort normal and breath sounds normal.  Neurological: He is  alert and oriented to person, place, and time.  Skin: Skin is warm and dry.  Psychiatric: His behavior is normal. Judgment and thought content normal. His mood appears anxious. He exhibits a depressed mood.       Assessment & Plan:   Problem List Items Addressed This Visit      Other   Anxiety state - Primary    Symptoms and exam consistent with anxiety state secondary to lifestyle changes following his NSTEMI. Anxiety is enough to affect his day-to-day life. Start Klonopin as needed for anxiety. Discussed potential daily medications and patient would like to hold on this at present. Follow-up in one month to determine effectiveness.      Relevant Medications   clonazePAM (KLONOPIN) 0.5 MG tablet   Dyspepsia    Dyspepsia currently maintained with Protonix. Previously had several endoscopies and colonoscopies. Concern for his GI system. Would like to be referred to gastroenterology for further evaluation. Referral to gastroneurology placed.      Relevant Orders   Ambulatory referral to Gastroenterology

## 2014-11-15 NOTE — Assessment & Plan Note (Signed)
Symptoms and exam consistent with anxiety state secondary to lifestyle changes following his NSTEMI. Anxiety is enough to affect his day-to-day life. Start Klonopin as needed for anxiety. Discussed potential daily medications and patient would like to hold on this at present. Follow-up in one month to determine effectiveness.

## 2014-11-24 NOTE — Progress Notes (Signed)
Cardiology Office Note   Date:  11/25/2014   ID:  Isaac Patel, DOB Aug 25, 1965, MRN [id number]  PCP:  Mauricio Po, FNP  Cardiologist:  Dr. [name]   Electrophysiologist:  n/a  Chief Complaint  Patient presents with  . Coronary Artery Disease     History of Present Illness: Isaac Patel is a 49 y.o. male who was admitted 8/1-8/3 with a NSTEMI.  LHC demonstrated 80% proximal LAD stenosis that was treated with a Resolute DES.  Echo demonstrated normal LVF.    I saw him 8/16 in follow-up. He had several different complaints including nasal congestion, nausea, chest pressure and shortness of breath. He was also frustrated at times. I felt the most of his symptoms are probably related to anxiety. He saw his primary care provider and was placed on Klonopin as needed. He returns for follow-up.  He is feeling better since starting Klonopin.  The patient denies chest pain, shortness of breath, syncope, orthopnea, PND or significant pedal edema.    Studies/Reports Reviewed Today:  Echo 10/30/14 Moderate LVH, EF 55-60%, normal wall motion, normal diastolic function, normal RV function  LHC 10/29/14 LAD: Proximal-mid 80% EF 50-55% with small focal region of mild mid anterolateral hypokinesis PCI: 3 x 15 mm resolute DES to the LAD Low-normal LV function with small focal region of mild mid anterolateral hypocontractility and an ejection fraction of 50-55%. Single vessel coronary obstructive disease with a focal 80% proximal tubular LAD stenosis before the takeoff of the first septal and diagonal vessel, which did not significantly improve following IC nitroglycerin administration. Normal left circumflex and dominant right coronary arteries. Successful PCI to the proximal LAD with the 80% stenosis being reduced to 0% with ultimate insertion of a 3.015 mm Resolute DES stent postdilated to 3.25 mm. RECOMMENDATION:  Continue dual antiplatelets therapy for minimum of a year.  Aggressive lipid-lowering therapy. Smoking cessation            Past Medical History  Diagnosis Date  . GERD (gastroesophageal reflux disease)   . NSTEMI (non-ST elevated myocardial infarction) 10/28/2014  . OSA (obstructive sleep apnea)     "tried mask; wear it off and on" (10/28/2014)  . Headache     "maybe monthly" (10/28/2014)  . Depression     hx  . CAD (coronary artery disease)     a.  Non-STEMI 8/16: LHC-proximal LAD 80% treated with a resolute DES, EF 50-55%;  b.  Echo 8/16:  Moderate LVH, EF 55-60%, normal wall motion, normal diastolic function, normal RV function  . HLD (hyperlipidemia)     Past Surgical History  Procedure Laterality Date  . 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 Tympanostomy tube placement Bilateral "as a kid"    "had one done as an adult too"  . Cholesteatoma excision Right 1990's  . Cardiac catheterization N/A 10/29/2014    Procedure: Left Heart Cath and Coronary Angiography;  Surgeon: Troy Sine, MD;  Location: [facility];  Service: Cardiovascular;  Laterality: N/A;     Current Outpatient Prescriptions  Medication Sig Dispense Refill  . AMBULATORY NON FORMULARY MEDICATION Take 90 mg by mouth 2 (two) times daily. Medication Name: Brilinta 90 mg BID (Provided by TWILIGHT Research Study* Do NOT Fill)    . AMBULATORY NON FORMULARY MEDICATION Take 81 mg by mouth daily. Medication Name: ASA 81 mg daily (Provided by [facility] Research study)    . atorvastatin (LIPITOR) 80 MG tablet Take 1 tablet (  80 mg total) by mouth daily at 6 PM. 30 tablet 11  . clonazePAM (KLONOPIN) 0.5 MG tablet Take 1 tablet (0.5 mg total) by mouth 2 (two) times daily as needed for anxiety. 60 tablet 0  . fluticasone (FLONASE) 50 MCG/ACT nasal spray Place into both nostrils daily.    . nitroGLYCERIN (NITROSTAT) 0.4 MG SL tablet Place 1 tablet (0.4 mg total) under the tongue every 5 (five) minutes x 3 doses as needed for chest pain. 25 tablet 12  .  nystatin cream (MYCOSTATIN) Apply 1 application topically 2 (two) times daily. (Patient taking differently: Apply 1 application topically 2 (two) times daily as needed (as needed for rash). ) 30 g 0  . pantoprazole (PROTONIX) 40 MG tablet Take 1 tablet (40 mg total) by mouth daily. 90 tablet 3   No current facility-administered medications for this visit.    Allergies:   Morphine and related    Social History:  The patient  reports that he has been smoking Cigarettes.  He has a 32 pack-year smoking history. He has never used smokeless tobacco. He reports that he drinks about 9.6 - 10.2 oz of alcohol per week. He reports that he uses illicit drugs (Marijuana).   Family History:  The patient's family history includes Healthy in his father and mother; Multiple sclerosis in his maternal grandfather.    ROS:   Please see the history of present illness.   Review of Systems  All other systems reviewed and are negative.     PHYSICAL EXAM: VS:  BP 118/80 mmHg  Pulse 76  Ht 5\' 11"  (1.803 m)  Wt 194 lb (87.998 kg)  BMI 27.07 kg/m2    Wt Readings from Last 3 Encounters:  11/25/14 194 lb (87.998 kg)  11/15/14 189 lb (85.73 kg)  11/12/14 186 lb 9.6 oz (84.641 kg)     GEN: Well nourished, well developed, in no acute distress HEENT: normal Neck: no JVD,  no masses Cardiac:  Normal S1/S2, RRR; no murmur ,  no rubs or gallops, no edema;   Respiratory:  clear to auscultation bilaterally, no wheezing, rhonchi or rales. 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  It demonstrates:  n/a   Recent Labs: 10/28/2014: ALT 16*; Magnesium 2.0; TSH 2.301 11/12/2014: BUN 16; Creatinine, Ser 1.04; Hemoglobin 15.7; Platelets 264.0; Potassium 4.2; Sodium 139    Lipid Panel    Component Value Date/Time   CHOL 166 10/29/2014 0455   TRIG 51  10/29/2014 0455   HDL 44 10/29/2014 0455   CHOLHDL 3.8 10/29/2014 0455   VLDL 10 10/29/2014 0455   LDLCALC 112* 10/29/2014 0455      ASSESSMENT AND PLAN:  Coronary artery disease:  Status post non-STEMI treated with a DES to the LAD. EF overall preserved.  He has been compliant with Brilinta and aspirin. He is enrolled in the [place] study.  Continue statin. He did have lower heart ates in the hospital and is not on beta blocker therapy. He has been referred to cardiac rehabilitation.  He has not heard back yet from cardiac rehab.  I will check on this for him.  He is eager to resume normal activities at home.  I have discussed this with him today and he knows to resume activities gradually.  He knows to avoid extreme heat  when working outdoors.    HLD (hyperlipidemia):  Continue statin.  Arrange lipids and LFTs    Tobacco abuse:  He has quit smoking.  Anxiety:  He is doing much better.  Continue Klonopin.  FU with PCP.    Medication Changes: Current medicines are reviewed at length with the patient today.  Concerns regarding medicines are as outlined above.  The following changes have been made:   Discontinued Medications   No medications on file   Modified Medications   No medications on file   New Prescriptions   No medications on file    Labs/ tests ordered today include:   Orders Placed This Encounter  Procedures  . Lipid Profile  . Hepatic function panel     Disposition:    FU with Dr. [name] 2 mos.    Signed, Versie Starks, MHS 11/25/2014 4:42 PM    [facility] [address], [place], [place]  [address] Phone: [phone]; Fax: [phone]

## 2014-11-25 ENCOUNTER — Ambulatory Visit (INDEPENDENT_AMBULATORY_CARE_PROVIDER_SITE_OTHER): Payer: 59 | Admitting: Physician Assistant

## 2014-11-25 ENCOUNTER — Encounter: Payer: Self-pay | Admitting: Physician Assistant

## 2014-11-25 VITALS — BP 118/80 | HR 76 | Ht 71.0 in | Wt 194.0 lb

## 2014-11-25 DIAGNOSIS — F419 Anxiety disorder, unspecified: Secondary | ICD-10-CM

## 2014-11-25 DIAGNOSIS — I2510 Atherosclerotic heart disease of native coronary artery without angina pectoris: Secondary | ICD-10-CM

## 2014-11-25 DIAGNOSIS — E785 Hyperlipidemia, unspecified: Secondary | ICD-10-CM

## 2014-11-25 DIAGNOSIS — Z720 Tobacco use: Secondary | ICD-10-CM | POA: Diagnosis not present

## 2014-11-25 NOTE — Patient Instructions (Addendum)
It is ok for you to gradually increase your activity. You may use your riding lawnmower as long as the temperatures are not extreme.  Avoid mowing if it is 88 degrees or higher. Avoid push mowers, leaf blowers, hedge trimmers for another 4 weeks. It is ok to vacuum your pool.  Just take it slowly and rest often at first.  Medication Instructions:  Your physician recommends that you continue on your current medications as directed. Please refer to the Current Medication list given to you today.   Labwork: 4 WEEKS FOR FASTING LIPID AND LIVER PANEL   Testing/Procedures: NONE  Follow-Up: 6-8 WEEK WITH DR. Radford Pax  Any Other Special Instructions Will Be Listed Below (If Applicable).

## 2014-12-05 ENCOUNTER — Encounter (HOSPITAL_COMMUNITY)
Admission: RE | Admit: 2014-12-05 | Discharge: 2014-12-05 | Disposition: A | Discharge status: Home / Self Care | Payer: 59 | Source: Ambulatory Visit | Attending: Cardiology | Admitting: Cardiology

## 2014-12-05 ENCOUNTER — Encounter: Payer: Self-pay | Admitting: *Deleted

## 2014-12-05 DIAGNOSIS — I2510 Atherosclerotic heart disease of native coronary artery without angina pectoris: Secondary | ICD-10-CM | POA: Insufficient documentation

## 2014-12-05 DIAGNOSIS — I252 Old myocardial infarction: Secondary | ICD-10-CM | POA: Insufficient documentation

## 2014-12-05 DIAGNOSIS — E785 Hyperlipidemia, unspecified: Secondary | ICD-10-CM | POA: Insufficient documentation

## 2014-12-05 DIAGNOSIS — Z006 Encounter for examination for normal comparison and control in clinical research program: Secondary | ICD-10-CM

## 2014-12-05 NOTE — Progress Notes (Signed)
TWILIGHT Research month 1 follow up completed. Patient compliant with medications, no bleeding problems. Questions encouraged and answered. 3 month TWILIGHT randomization visit due Nov 2nd and patient verbalizes willingness to combine cardiac rehab visit with research visit.

## 2014-12-09 ENCOUNTER — Encounter (HOSPITAL_COMMUNITY)
Admission: RE | Admit: 2014-12-09 | Discharge: 2014-12-09 | Disposition: A | Discharge status: Home / Self Care | Payer: 59 | Source: Ambulatory Visit | Attending: Cardiology | Admitting: Cardiology

## 2014-12-09 DIAGNOSIS — I252 Old myocardial infarction: Secondary | ICD-10-CM | POA: Diagnosis present

## 2014-12-09 DIAGNOSIS — I2510 Atherosclerotic heart disease of native coronary artery without angina pectoris: Secondary | ICD-10-CM | POA: Diagnosis not present

## 2014-12-09 DIAGNOSIS — E785 Hyperlipidemia, unspecified: Secondary | ICD-10-CM | POA: Diagnosis not present

## 2014-12-09 NOTE — Progress Notes (Signed)
Pt started exercise at the 6:45 Phase II  cardiac rehab program.  Pt tolerated light exercise without difficulty. VSS, telemetry-SR, asymptomatic.  Medication list reconciled.  Pt verbalized compliance with medications and denies barriers to compliance. PSYCHOSOCIAL ASSESSMENT:  PHQ-0. Pt exhibits positive coping skills, hopeful outlook with supportive family. 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 No psychosocial needs identified at this time, no psychosocial interventions necessary.    Pt enjoys yardwork.   Pt cardiac rehab  goal is to get stronger and back to function activities without having another cardiac event or restrictions.  Pt long term goal is to make lifestyle modifications to reduce risk factors. Pt encouraged to participate in education classes on Wednesday and Fridays which may be limited due to his work schedule and transport time to [place] area, home exercise and nutrition classes on Tuesdays to increase ability to achieve these goals.  Pt oriented to exercise equipment and routine.  Understanding verbalized. Cherre Huger, BSN

## 2014-12-11 ENCOUNTER — Encounter (HOSPITAL_COMMUNITY)
Admission: RE | Admit: 2014-12-11 | Discharge: 2014-12-11 | Disposition: A | Discharge status: Home / Self Care | Payer: 59 | Source: Ambulatory Visit | Attending: Cardiology | Admitting: Cardiology

## 2014-12-11 DIAGNOSIS — I252 Old myocardial infarction: Secondary | ICD-10-CM | POA: Diagnosis not present

## 2014-12-13 ENCOUNTER — Encounter (HOSPITAL_COMMUNITY)
Admission: RE | Admit: 2014-12-13 | Discharge: 2014-12-13 | Disposition: A | Discharge status: Home / Self Care | Payer: 59 | Source: Ambulatory Visit | Attending: Cardiology | Admitting: Cardiology

## 2014-12-13 DIAGNOSIS — I252 Old myocardial infarction: Secondary | ICD-10-CM | POA: Diagnosis not present

## 2014-12-16 ENCOUNTER — Encounter (HOSPITAL_COMMUNITY)
Admission: RE | Admit: 2014-12-16 | Discharge: 2014-12-16 | Disposition: A | Discharge status: Home / Self Care | Payer: 59 | Source: Ambulatory Visit | Attending: Cardiology | Admitting: Cardiology

## 2014-12-16 DIAGNOSIS — I252 Old myocardial infarction: Secondary | ICD-10-CM | POA: Diagnosis not present

## 2014-12-17 ENCOUNTER — Ambulatory Visit: Payer: 59 | Admitting: Family

## 2014-12-18 ENCOUNTER — Ambulatory Visit (INDEPENDENT_AMBULATORY_CARE_PROVIDER_SITE_OTHER): Payer: 59 | Admitting: Family

## 2014-12-18 ENCOUNTER — Encounter: Payer: Self-pay | Admitting: Family

## 2014-12-18 ENCOUNTER — Encounter (HOSPITAL_COMMUNITY)
Admission: RE | Admit: 2014-12-18 | Discharge: 2014-12-18 | Disposition: A | Discharge status: Home / Self Care | Payer: 59 | Source: Ambulatory Visit | Attending: Cardiology | Admitting: Cardiology

## 2014-12-18 VITALS — BP 122/78 | HR 72 | Temp 98.4°F | Resp 18 | Ht 71.0 in | Wt 192.8 lb

## 2014-12-18 DIAGNOSIS — F411 Generalized anxiety disorder: Secondary | ICD-10-CM | POA: Diagnosis not present

## 2014-12-18 DIAGNOSIS — I252 Old myocardial infarction: Secondary | ICD-10-CM | POA: Diagnosis not present

## 2014-12-18 NOTE — Progress Notes (Signed)
Pre visit review using our clinic review tool, if applicable. No additional management support is needed unless otherwise documented below in the visit note. 

## 2014-12-18 NOTE — Assessment & Plan Note (Signed)
Anxiety stable with current regimen of clonazepam as needed. Denies adverse side effects. Continue current dosage of clonazepam as needed and follow up in 6 months or sooner if needed.

## 2014-12-18 NOTE — Progress Notes (Signed)
Subjective:    [name], male    DOB: 1965/08/09, 49 y.o.   MRN: [id number]  Chief Complaint  Patient presents with  . Follow-up    states that klonopin has been working good and he only takes them as needed.     HPI:  Isaac Patel is a 49 y.o. male who  has a past medical history of GERD (gastroesophageal reflux disease); NSTEMI (non-ST elevated myocardial infarction) (10/28/2014); OSA (obstructive sleep apnea); Headache; Depression; CAD (coronary artery disease); and HLD (hyperlipidemia). and presents today for a follow up office visit.  1.) Anxiety - Currently maintained on klonopin and notes that he takes the medication as prescribed and denies adverse side effects. Reports that the klonopin helps to control his symptoms well. Currently takes the medication about 1 time every 3 days.   Allergies  Allergen Reactions  . Morphine And Related Itching     Current Outpatient Prescriptions on File Prior to Visit  Medication Sig Dispense Refill  . AMBULATORY NON FORMULARY MEDICATION Take 90 mg by mouth 2 (two) times daily. Medication Name: Brilinta 90 mg BID (Provided by TWILIGHT Research Study* Do NOT Fill)    . AMBULATORY NON FORMULARY MEDICATION Take 81 mg by mouth daily. Medication Name: ASA 81 mg daily (Provided by TWILIGHT Research study)    . atorvastatin (LIPITOR) 80 MG tablet Take 1 tablet (80 mg total) by mouth daily at 6 PM. 30 tablet 11  . clonazePAM (KLONOPIN) 0.5 MG tablet Take 1 tablet (0.5 mg total) by mouth 2 (two) times daily as needed for anxiety. 60 tablet 0  . fluticasone (FLONASE) 50 MCG/ACT nasal spray Place into both nostrils daily.    . nitroGLYCERIN (NITROSTAT) 0.4 MG SL tablet Place 1 tablet (0.4 mg total) under the tongue every 5 (five) minutes x 3 doses as needed for chest pain. 25 tablet 12  . nystatin cream (MYCOSTATIN) Apply 1 application topically 2 (two) times daily. (Patient taking differently: Apply 1 application topically 2 (two)  times daily as needed (as needed for rash). ) 30 g 0  . pantoprazole (PROTONIX) 40 MG tablet Take 1 tablet (40 mg total) by mouth daily. 90 tablet 3   No current facility-administered medications on file prior to visit.    Review of Systems  Respiratory: Negative for chest tightness and shortness of breath.   Cardiovascular: Negative for chest pain, palpitations and leg swelling.  Psychiatric/Behavioral: Negative for sleep disturbance and dysphoric mood. The patient is not nervous/anxious.       Objective:    BP 122/78 mmHg  Pulse 72  Temp(Src) 98.4 F (36.9 C) (Oral)  Resp 18  Ht 5\' 11"  (1.803 m)  Wt 192 lb 12.8 oz (87.454 kg)  BMI 26.90 kg/m2  SpO2 98% Nursing note and vital signs reviewed.  Physical Exam  Constitutional: He is oriented to person, place, and time. He appears well-developed and well-nourished. No distress.  Cardiovascular: Normal rate, regular rhythm, normal heart sounds and intact distal pulses.   Pulmonary/Chest: Effort normal and breath sounds normal.  Neurological: He is alert and oriented to person, place, and time.  Skin: Skin is warm and dry.  Psychiatric: He has a normal mood and affect. His behavior is normal. Judgment and thought content normal. His mood appears not anxious. He does not exhibit a depressed mood.       Assessment & Plan:   Problem List Items Addressed This Visit      Other   Anxiety state -  Primary    Anxiety stable with current regimen of clonazepam as needed. Denies adverse side effects. Continue current dosage of clonazepam as needed and follow up in 6 months or sooner if needed.

## 2014-12-18 NOTE — Patient Instructions (Signed)
Thank you for choosing [facility].  Summary/Instructions:  Please continue to take your medications as prescribed.   If your symptoms worsen or fail to improve, please contact our office for further instruction, or in case of emergency go directly to the emergency room at the closest medical facility.     

## 2014-12-19 ENCOUNTER — Encounter: Payer: Self-pay | Admitting: Gastroenterology

## 2014-12-20 ENCOUNTER — Encounter (HOSPITAL_COMMUNITY)
Admission: RE | Admit: 2014-12-20 | Discharge: 2014-12-20 | Disposition: A | Discharge status: Home / Self Care | Payer: 59 | Source: Ambulatory Visit | Attending: Cardiology | Admitting: Cardiology

## 2014-12-20 DIAGNOSIS — I252 Old myocardial infarction: Secondary | ICD-10-CM | POA: Diagnosis not present

## 2014-12-23 ENCOUNTER — Encounter (HOSPITAL_COMMUNITY)
Admission: RE | Admit: 2014-12-23 | Discharge: 2014-12-23 | Disposition: A | Discharge status: Home / Self Care | Payer: 59 | Source: Ambulatory Visit | Attending: Cardiology | Admitting: Cardiology

## 2014-12-23 DIAGNOSIS — I252 Old myocardial infarction: Secondary | ICD-10-CM | POA: Diagnosis not present

## 2014-12-25 ENCOUNTER — Encounter (HOSPITAL_COMMUNITY)
Admission: RE | Admit: 2014-12-25 | Discharge: 2014-12-25 | Disposition: A | Discharge status: Home / Self Care | Payer: 59 | Source: Ambulatory Visit | Attending: Cardiology | Admitting: Cardiology

## 2014-12-25 DIAGNOSIS — I252 Old myocardial infarction: Secondary | ICD-10-CM | POA: Diagnosis not present

## 2014-12-25 NOTE — Progress Notes (Signed)
Reviewed home exercise with pt today.  Pt plans to walk for exercise 2-3 days a week, in addition to CRPII.  Reviewed THR, pulse, RPE, sign and symptoms, NTG use, and when to call 911 or MD.  Pt voiced understanding.   Isaac Patel

## 2014-12-27 ENCOUNTER — Encounter (HOSPITAL_COMMUNITY)
Admission: RE | Admit: 2014-12-27 | Discharge: 2014-12-27 | Disposition: A | Discharge status: Home / Self Care | Payer: 59 | Source: Ambulatory Visit | Attending: Cardiology | Admitting: Cardiology

## 2014-12-27 DIAGNOSIS — I252 Old myocardial infarction: Secondary | ICD-10-CM | POA: Diagnosis not present

## 2014-12-30 ENCOUNTER — Encounter (HOSPITAL_COMMUNITY)
Admission: RE | Admit: 2014-12-30 | Discharge: 2014-12-30 | Disposition: A | Discharge status: Home / Self Care | Payer: 59 | Source: Ambulatory Visit | Attending: Cardiology | Admitting: Cardiology

## 2014-12-30 DIAGNOSIS — I252 Old myocardial infarction: Secondary | ICD-10-CM | POA: Insufficient documentation

## 2014-12-30 DIAGNOSIS — E785 Hyperlipidemia, unspecified: Secondary | ICD-10-CM | POA: Diagnosis not present

## 2014-12-30 DIAGNOSIS — I2510 Atherosclerotic heart disease of native coronary artery without angina pectoris: Secondary | ICD-10-CM | POA: Diagnosis not present

## 2015-01-01 ENCOUNTER — Encounter (HOSPITAL_COMMUNITY)
Admission: RE | Admit: 2015-01-01 | Discharge: 2015-01-01 | Disposition: A | Discharge status: Home / Self Care | Payer: 59 | Source: Ambulatory Visit | Attending: Cardiology | Admitting: Cardiology

## 2015-01-01 DIAGNOSIS — I252 Old myocardial infarction: Secondary | ICD-10-CM | POA: Diagnosis not present

## 2015-01-01 NOTE — Progress Notes (Signed)
QUALITY OF LIFE SCORE REVIEW Patient score low in areas of overall - 16.09, health and function - 12.93, Psychological and spiritual - 13.07.   Scores less than 21 are considered low.  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 Pt wears a prosthesis but is limited in the activities he enjoyed prior to the accident.  Pt recently moved here to [place] from [facility].  Pt is adjusting to a new job which he finds demanding.  Pt unable to stay for education classes due to having to get to work as quickly as possible.  Pt outlook is deemed and he remains closed to offered suggestions.  At rare occasions pt is seen interacting with others but mostly prefers to stay to himself.  Pt did not have any suggestions for the rehab staff regarding his outlook.  Offered to send his completed survey to his MD for review.  Pt declined indicating that would not help his situation.   Offered acceptable emotional support and reassurance.  Will continue to monitor and intervene as necessary.  Cherre Huger, BSN

## 2015-01-03 ENCOUNTER — Encounter (HOSPITAL_COMMUNITY)
Admission: RE | Admit: 2015-01-03 | Discharge: 2015-01-03 | Disposition: A | Discharge status: Home / Self Care | Payer: 59 | Source: Ambulatory Visit | Attending: Cardiology | Admitting: Cardiology

## 2015-01-03 DIAGNOSIS — I252 Old myocardial infarction: Secondary | ICD-10-CM | POA: Diagnosis not present

## 2015-01-06 ENCOUNTER — Encounter (HOSPITAL_COMMUNITY)
Admission: RE | Admit: 2015-01-06 | Discharge: 2015-01-06 | Disposition: A | Discharge status: Home / Self Care | Payer: 59 | Source: Ambulatory Visit | Attending: Cardiology | Admitting: Cardiology

## 2015-01-06 DIAGNOSIS — I252 Old myocardial infarction: Secondary | ICD-10-CM | POA: Diagnosis not present

## 2015-01-08 ENCOUNTER — Encounter (HOSPITAL_COMMUNITY)
Admission: RE | Admit: 2015-01-08 | Discharge: 2015-01-08 | Disposition: A | Discharge status: Home / Self Care | Payer: 59 | Source: Ambulatory Visit | Attending: Cardiology | Admitting: Cardiology

## 2015-01-08 DIAGNOSIS — I252 Old myocardial infarction: Secondary | ICD-10-CM | POA: Diagnosis not present

## 2015-01-09 ENCOUNTER — Ambulatory Visit: Payer: 59 | Admitting: Cardiology

## 2015-01-10 ENCOUNTER — Encounter (HOSPITAL_COMMUNITY)
Admission: RE | Admit: 2015-01-10 | Discharge: 2015-01-10 | Disposition: A | Discharge status: Home / Self Care | Payer: 59 | Source: Ambulatory Visit | Attending: Cardiology | Admitting: Cardiology

## 2015-01-10 DIAGNOSIS — I252 Old myocardial infarction: Secondary | ICD-10-CM | POA: Diagnosis not present

## 2015-01-13 ENCOUNTER — Encounter: Payer: Self-pay | Admitting: *Deleted

## 2015-01-13 ENCOUNTER — Encounter (HOSPITAL_COMMUNITY)
Admission: RE | Admit: 2015-01-13 | Discharge: 2015-01-13 | Disposition: A | Discharge status: Home / Self Care | Payer: 59 | Source: Ambulatory Visit | Attending: Cardiology | Admitting: Cardiology

## 2015-01-13 DIAGNOSIS — Z006 Encounter for examination for normal comparison and control in clinical research program: Secondary | ICD-10-CM

## 2015-01-13 DIAGNOSIS — I252 Old myocardial infarction: Secondary | ICD-10-CM | POA: Diagnosis not present

## 2015-01-13 NOTE — Progress Notes (Signed)
TWILIGHT Research Study 3 month randomization visit completed. Patient denies any adverse or bleeding events. Medications reviewed questions encouraged and answered. Next study visit due nov 23rd.

## 2015-01-15 ENCOUNTER — Other Ambulatory Visit: Payer: Self-pay | Admitting: *Deleted

## 2015-01-15 ENCOUNTER — Encounter (HOSPITAL_COMMUNITY)
Admission: RE | Admit: 2015-01-15 | Discharge: 2015-01-15 | Disposition: A | Discharge status: Home / Self Care | Payer: 59 | Source: Ambulatory Visit | Attending: Cardiology | Admitting: Cardiology

## 2015-01-15 DIAGNOSIS — I252 Old myocardial infarction: Secondary | ICD-10-CM | POA: Diagnosis not present

## 2015-01-15 MED ORDER — AMBULATORY NON FORMULARY MEDICATION: 81.0000 mg | Freq: Every day | Status: DC

## 2015-01-15 NOTE — Progress Notes (Signed)
Isaac Patel 49 y.o. male Nutrition Note Spoke with pt. Nutrition Survey reviewed with pt. Pt is following Step 2 of the Therapeutic Lifestyle Changes diet. Pt reports he is not a big breakfast eater. Quick, easy breakfast ideas discussed. Pt expressed understanding of the information reviewed. Pt aware of nutrition education classes offered. Per pt, pt's work may inhibit pt's ability to attend nutrition classes at this time. Lab Results  Component Value Date   HGBA1C 5.6 10/28/2014   Wt Readings from Last 3 Encounters:  12/18/14 192 lb 12.8 oz (87.454 kg)  11/25/14 194 lb (87.998 kg)  11/15/14 189 lb (85.73 kg)   Nutrition Diagnosis ? Food-and nutrition-related knowledge deficit related to lack of exposure to information as related to diagnosis of: ? CVD ? Overweight related to excessive energy intake as evidenced by a BMI of 26.7  Nutrition Intervention ? Benefits of adopting Therapeutic Lifestyle Changes discussed when Medficts reviewed. ? Pt to attend the Portion Distortion class ? Pt to attend the  ? Nutrition I class                          ? Nutrition II class ? Pt given handouts for: ? Nutrition I class ? Nutrition II class ? Continue client-centered nutrition education by RD, as part of interdisciplinary care.  Goal(s) ? Pt to identify food quantities necessary to achieve: ? wt loss to a goal wt of 168-186 lb (76.4-84.6 kg) at graduation from cardiac rehab.  ? Pt to describe the benefit of including fruits, vegetables, whole grains, and low-fat dairy products in a heart healthy meal plan.  Monitor and Evaluate progress toward nutrition goal with team.  Derek Mound, M.Ed, RD, LDN, CDE 01/15/2015 8:04 AM

## 2015-01-17 ENCOUNTER — Encounter (HOSPITAL_COMMUNITY)
Admission: RE | Admit: 2015-01-17 | Discharge: 2015-01-17 | Disposition: A | Discharge status: Home / Self Care | Payer: 59 | Source: Ambulatory Visit | Attending: Cardiology | Admitting: Cardiology

## 2015-01-17 DIAGNOSIS — I252 Old myocardial infarction: Secondary | ICD-10-CM | POA: Diagnosis not present

## 2015-01-20 ENCOUNTER — Encounter (HOSPITAL_COMMUNITY)
Admission: RE | Admit: 2015-01-20 | Discharge: 2015-01-20 | Disposition: A | Discharge status: Home / Self Care | Payer: 59 | Source: Ambulatory Visit | Attending: Cardiology | Admitting: Cardiology

## 2015-01-20 DIAGNOSIS — I252 Old myocardial infarction: Secondary | ICD-10-CM | POA: Diagnosis not present

## 2015-01-22 ENCOUNTER — Encounter (HOSPITAL_COMMUNITY)
Admission: RE | Admit: 2015-01-22 | Discharge: 2015-01-22 | Disposition: A | Discharge status: Home / Self Care | Payer: 59 | Source: Ambulatory Visit | Attending: Cardiology | Admitting: Cardiology

## 2015-01-22 DIAGNOSIS — I252 Old myocardial infarction: Secondary | ICD-10-CM | POA: Diagnosis not present

## 2015-01-24 ENCOUNTER — Encounter (HOSPITAL_COMMUNITY)
Admission: RE | Admit: 2015-01-24 | Discharge: 2015-01-24 | Disposition: A | Discharge status: Home / Self Care | Payer: 59 | Source: Ambulatory Visit | Attending: Cardiology | Admitting: Cardiology

## 2015-01-24 DIAGNOSIS — I252 Old myocardial infarction: Secondary | ICD-10-CM | POA: Diagnosis not present

## 2015-01-27 ENCOUNTER — Encounter (HOSPITAL_COMMUNITY)
Admission: RE | Admit: 2015-01-27 | Discharge: 2015-01-27 | Disposition: A | Discharge status: Home / Self Care | Payer: 59 | Source: Ambulatory Visit | Attending: Cardiology | Admitting: Cardiology

## 2015-01-27 DIAGNOSIS — I252 Old myocardial infarction: Secondary | ICD-10-CM | POA: Diagnosis not present

## 2015-01-29 ENCOUNTER — Encounter (HOSPITAL_COMMUNITY)
Admission: RE | Admit: 2015-01-29 | Discharge: 2015-01-29 | Disposition: A | Discharge status: Home / Self Care | Payer: 59 | Source: Ambulatory Visit | Attending: Cardiology | Admitting: Cardiology

## 2015-01-29 DIAGNOSIS — I2510 Atherosclerotic heart disease of native coronary artery without angina pectoris: Secondary | ICD-10-CM | POA: Insufficient documentation

## 2015-01-29 DIAGNOSIS — I252 Old myocardial infarction: Secondary | ICD-10-CM | POA: Insufficient documentation

## 2015-01-29 DIAGNOSIS — E785 Hyperlipidemia, unspecified: Secondary | ICD-10-CM | POA: Insufficient documentation

## 2015-01-31 ENCOUNTER — Encounter (HOSPITAL_COMMUNITY)
Admission: RE | Admit: 2015-01-31 | Discharge: 2015-01-31 | Disposition: A | Discharge status: Home / Self Care | Payer: 59 | Source: Ambulatory Visit | Attending: Cardiology | Admitting: Cardiology

## 2015-01-31 DIAGNOSIS — I252 Old myocardial infarction: Secondary | ICD-10-CM | POA: Diagnosis not present

## 2015-02-03 ENCOUNTER — Encounter (HOSPITAL_COMMUNITY)
Admission: RE | Admit: 2015-02-03 | Discharge: 2015-02-03 | Disposition: A | Discharge status: Home / Self Care | Payer: 59 | Source: Ambulatory Visit | Attending: Cardiology | Admitting: Cardiology

## 2015-02-03 DIAGNOSIS — I252 Old myocardial infarction: Secondary | ICD-10-CM | POA: Diagnosis not present

## 2015-02-05 ENCOUNTER — Encounter (HOSPITAL_COMMUNITY)
Admission: RE | Admit: 2015-02-05 | Discharge: 2015-02-05 | Disposition: A | Discharge status: Home / Self Care | Payer: 59 | Source: Ambulatory Visit | Attending: Cardiology | Admitting: Cardiology

## 2015-02-05 DIAGNOSIS — I252 Old myocardial infarction: Secondary | ICD-10-CM | POA: Diagnosis not present

## 2015-02-07 ENCOUNTER — Encounter (HOSPITAL_COMMUNITY)
Admission: RE | Admit: 2015-02-07 | Discharge: 2015-02-07 | Disposition: A | Discharge status: Home / Self Care | Payer: 59 | Source: Ambulatory Visit | Attending: Cardiology | Admitting: Cardiology

## 2015-02-07 DIAGNOSIS — I252 Old myocardial infarction: Secondary | ICD-10-CM | POA: Diagnosis not present

## 2015-02-10 ENCOUNTER — Encounter (HOSPITAL_COMMUNITY)
Admission: RE | Admit: 2015-02-10 | Discharge: 2015-02-10 | Disposition: A | Discharge status: Home / Self Care | Payer: 59 | Source: Ambulatory Visit | Attending: Cardiology | Admitting: Cardiology

## 2015-02-10 DIAGNOSIS — I252 Old myocardial infarction: Secondary | ICD-10-CM | POA: Diagnosis not present

## 2015-02-12 ENCOUNTER — Encounter (HOSPITAL_COMMUNITY)
Admission: RE | Admit: 2015-02-12 | Discharge: 2015-02-12 | Disposition: A | Discharge status: Home / Self Care | Payer: 59 | Source: Ambulatory Visit | Attending: Cardiology | Admitting: Cardiology

## 2015-02-12 DIAGNOSIS — I252 Old myocardial infarction: Secondary | ICD-10-CM | POA: Diagnosis not present

## 2015-02-14 ENCOUNTER — Encounter (HOSPITAL_COMMUNITY)
Admission: RE | Admit: 2015-02-14 | Discharge: 2015-02-14 | Disposition: A | Discharge status: Home / Self Care | Payer: 59 | Source: Ambulatory Visit | Attending: Cardiology | Admitting: Cardiology

## 2015-02-14 DIAGNOSIS — I252 Old myocardial infarction: Secondary | ICD-10-CM | POA: Diagnosis not present

## 2015-02-17 ENCOUNTER — Encounter (HOSPITAL_COMMUNITY)
Admission: RE | Admit: 2015-02-17 | Discharge: 2015-02-17 | Disposition: A | Discharge status: Home / Self Care | Payer: 59 | Source: Ambulatory Visit | Attending: Cardiology | Admitting: Cardiology

## 2015-02-17 DIAGNOSIS — I252 Old myocardial infarction: Secondary | ICD-10-CM | POA: Diagnosis not present

## 2015-02-19 ENCOUNTER — Encounter (HOSPITAL_COMMUNITY)
Admission: RE | Admit: 2015-02-19 | Discharge: 2015-02-19 | Disposition: A | Discharge status: Home / Self Care | Payer: 59 | Source: Ambulatory Visit | Attending: Cardiology | Admitting: Cardiology

## 2015-02-19 DIAGNOSIS — I252 Old myocardial infarction: Secondary | ICD-10-CM | POA: Diagnosis not present

## 2015-02-21 ENCOUNTER — Encounter: Payer: Self-pay | Admitting: *Deleted

## 2015-02-21 ENCOUNTER — Ambulatory Visit (INDEPENDENT_AMBULATORY_CARE_PROVIDER_SITE_OTHER): Payer: 59 | Admitting: Family

## 2015-02-21 VITALS — BP 134/88 | HR 70 | Temp 98.6°F | Resp 16 | Wt 194.0 lb

## 2015-02-21 DIAGNOSIS — J0100 Acute maxillary sinusitis, unspecified: Secondary | ICD-10-CM

## 2015-02-21 DIAGNOSIS — J019 Acute sinusitis, unspecified: Secondary | ICD-10-CM | POA: Insufficient documentation

## 2015-02-21 DIAGNOSIS — Z006 Encounter for examination for normal comparison and control in clinical research program: Secondary | ICD-10-CM

## 2015-02-21 MED ORDER — AMOXICILLIN-POT CLAVULANATE 875-125 MG PO TABS: 1.0000 | ORAL_TABLET | Freq: Two times a day (BID) | ORAL | Status: DC

## 2015-02-21 MED ORDER — ACETAMINOPHEN-CODEINE #3 300-30 MG PO TABS: 1.0000 | ORAL_TABLET | ORAL | Status: DC | PRN

## 2015-02-21 NOTE — Progress Notes (Signed)
Pre visit review using our clinic review tool, if applicable. No additional management support is needed unless otherwise documented below in the visit note. 

## 2015-02-21 NOTE — Progress Notes (Signed)
Subjective:    [name], male    DOB: 11/16/1965, 49 y.o.   MRN: [id number]  Chief Complaint  Patient presents with  . Sinus Problem    Sinus Headache, yellow discharge from nose. xs days, OTC meds have not helped.    HPI:  Isaac Patel is a 49 y.o. male who  has a past medical history of GERD (gastroesophageal reflux disease); NSTEMI (non-ST elevated myocardial infarction) (10/28/2014); OSA (obstructive sleep apnea); Headache; Depression; CAD (coronary artery disease); and HLD (hyperlipidemia). and presents today for an acute office visit.   This is a new problem. Associated symptoms of sinus pressure, yellow dischrage, and congestion has been going on for about 1 week. Initially improved early in the week and has gotten worse in the last couple of days. Modifying factors include Tylenol and netti pot which have not helped very much. Denies any recent antibiotic use.   Allergies  Allergen Reactions  . Morphine And Related Itching     Current Outpatient Prescriptions on File Prior to Visit  Medication Sig Dispense Refill  . AMBULATORY NON FORMULARY MEDICATION Take 90 mg by mouth 2 (two) times daily. Medication Name: Brilinta 90 mg BID (Provided by TWILIGHT Research Study* Do NOT Fill)    . AMBULATORY NON FORMULARY MEDICATION Take 81 mg by mouth daily. Medication Name: ASA 81 mg Daily or PLACEBO (TWILIGHT Research study provided, NO open label ASA)    . atorvastatin (LIPITOR) 80 MG tablet Take 1 tablet (80 mg total) by mouth daily at 6 PM. 30 tablet 11  . clonazePAM (KLONOPIN) 0.5 MG tablet Take 1 tablet (0.5 mg total) by mouth 2 (two) times daily as needed for anxiety. 60 tablet 0  . fluticasone (FLONASE) 50 MCG/ACT nasal spray Place into both nostrils daily.    . nitroGLYCERIN (NITROSTAT) 0.4 MG SL tablet Place 1 tablet (0.4 mg total) under the tongue every 5 (five) minutes x 3 doses as needed for chest pain. 25 tablet 12  . nystatin cream (MYCOSTATIN) Apply 1  application topically 2 (two) times daily. (Patient taking differently: Apply 1 application topically 2 (two) times daily as needed (as needed for rash). ) 30 g 0  . pantoprazole (PROTONIX) 40 MG tablet Take 1 tablet (40 mg total) by mouth daily. 90 tablet 3   No current facility-administered medications on file prior to visit.    Review of Systems  Constitutional: Negative for fever and chills.  HENT: Positive for congestion and sinus pressure. Negative for sore throat.   Respiratory: Positive for cough. Negative for chest tightness and shortness of breath.   Neurological: Positive for headaches.      Objective:    BP 134/88 mmHg  Pulse 70  Temp(Src) 98.6 F (37 C) (Oral)  Resp 16  Wt 194 lb (87.998 kg)  SpO2 98% Nursing note and vital signs reviewed.  Physical Exam  Constitutional: He is oriented to person, place, and time. He appears well-developed and well-nourished. No distress.  HENT:  Right Ear: Hearing, tympanic membrane, external ear and ear canal normal.  Left Ear: Hearing, tympanic membrane, external ear and ear canal normal.  Nose: Right sinus exhibits maxillary sinus tenderness. Right sinus exhibits no frontal sinus tenderness. Left sinus exhibits no maxillary sinus tenderness and no frontal sinus tenderness.  Mouth/Throat: Uvula is midline, oropharynx is clear and moist and mucous membranes are normal.  Neck: Neck supple.  Cardiovascular: Normal rate, regular rhythm, normal heart sounds and intact distal pulses.   Pulmonary/Chest:  Effort normal and breath sounds normal.  Lymphadenopathy:    He has no cervical adenopathy.  Neurological: He is alert and oriented to person, place, and time.  Skin: Skin is warm and dry.  Psychiatric: He has a normal mood and affect. His behavior is normal. Judgment and thought content normal.       Assessment & Plan:   Problem List Items Addressed This Visit      Respiratory   Sinusitis, acute - Primary    Symptoms and exam  consistent with actual sinusitis. Start Augmentin. Start Tylenol 3 as needed for headaches. Continue over-the-counter medications as needed for symptom relief and supportive care. Follow-up if symptoms worsen or fail to improve.      Relevant Medications   amoxicillin-clavulanate (AUGMENTIN) 875-125 MG tablet

## 2015-02-21 NOTE — Patient Instructions (Signed)
Thank you for choosing  HealthCare.  Summary/Instructions:  Your prescription(s) have been submitted to your pharmacy or been printed and provided for you. Please take as directed and contact our office if you believe you are having problem(s) with the medication(s) or have any questions.  If your symptoms worsen or fail to improve, please contact our office for further instruction, or in case of emergency go directly to the emergency room at the closest medical facility.   General Recommendations:    Please drink plenty of fluids.  Get plenty of rest   Sleep in humidified air  Use saline nasal sprays  Netti pot   OTC Medications:  Decongestants - helps relieve congestion   Flonase (generic fluticasone) or Nasacort (generic triamcinolone) - please make sure to use the "cross-over" technique at a 45 degree angle towards the opposite eye as opposed to straight up the nasal passageway.   Sudafed (generic pseudoephedrine - Note this is the one that is available behind the pharmacy counter); Products with phenylephrine (-PE) may also be used but is often not as effective as pseudoephedrine.   If you have HIGH BLOOD PRESSURE - Coricidin HBP; AVOID any product that is -D as this contains pseudoephedrine which may increase your blood pressure.  Afrin (oxymetazoline) every 6-8 hours for up to 3 days.   Allergies - helps relieve runny nose, itchy eyes and sneezing   Claritin (generic loratidine), Allegra (fexofenidine), or Zyrtec (generic cyrterizine) for runny nose. These medications should not cause drowsiness.  Note - Benadryl (generic diphenhydramine) may be used however may cause drowsiness  Cough -   Delsym or Robitussin (generic dextromethorphan)  Expectorants - helps loosen mucus to ease removal   Mucinex (generic guaifenesin) as directed on the package.  Headaches / General Aches   Tylenol (generic acetaminophen) - DO NOT EXCEED 3 grams (3,000 mg) in a 24  hour time period  Advil/Motrin (generic ibuprofen)   Sore Throat -   Salt water gargle   Chloraseptic (generic benzocaine) spray or lozenges / Sucrets (generic dyclonine)    Sinusitis Sinusitis is redness, soreness, and inflammation of the paranasal sinuses. Paranasal sinuses are air pockets within the bones of your face (beneath the eyes, the middle of the forehead, or above the eyes). In healthy paranasal sinuses, mucus is able to drain out, and air is able to circulate through them by way of your nose. However, when your paranasal sinuses are inflamed, mucus and air can become trapped. This can allow bacteria and other germs to grow and cause infection. Sinusitis can develop quickly and last only a short time (acute) or continue over a long period (chronic). Sinusitis that lasts for more than 12 weeks is considered chronic.  CAUSES  Causes of sinusitis include:  Allergies.  Structural abnormalities, such as displacement of the cartilage that separates your nostrils (deviated septum), which can decrease the air flow through your nose and sinuses and affect sinus drainage.  Functional abnormalities, such as when the small hairs (cilia) that line your sinuses and help remove mucus do not work properly or are not present. SIGNS AND SYMPTOMS  Symptoms of acute and chronic sinusitis are the same. The primary symptoms are pain and pressure around the affected sinuses. Other symptoms include:  Upper toothache.  Earache.  Headache.  Bad breath.  Decreased sense of smell and taste.  A cough, which worsens when you are lying flat.  Fatigue.  Fever.  Thick drainage from your nose, which often is green and may   contain pus (purulent).  Swelling and warmth over the affected sinuses. DIAGNOSIS  Your health care provider will perform a physical exam. During the exam, your health care provider may:  Look in your nose for signs of abnormal growths in your nostrils (nasal  polyps).  Tap over the affected sinus to check for signs of infection.  View the inside of your sinuses (endoscopy) using an imaging device that has a light attached (endoscope). If your health care provider suspects that you have chronic sinusitis, one or more of the following tests may be recommended:  Allergy tests.  Nasal culture. A sample of mucus is taken from your nose, sent to a lab, and screened for bacteria.  Nasal cytology. A sample of mucus is taken from your nose and examined by your health care provider to determine if your sinusitis is related to an allergy. TREATMENT  Most cases of acute sinusitis are related to a viral infection and will resolve on their own within 10 days. Sometimes medicines are prescribed to help relieve symptoms (pain medicine, decongestants, nasal steroid sprays, or saline sprays).  However, for sinusitis related to a bacterial infection, your health care provider will prescribe antibiotic medicines. These are medicines that will help kill the bacteria causing the infection.  Rarely, sinusitis is caused by a fungal infection. In theses cases, your health care provider will prescribe antifungal medicine. For some cases of chronic sinusitis, surgery is needed. Generally, these are cases in which sinusitis recurs more than 3 times per year, despite other treatments. HOME CARE INSTRUCTIONS   Drink plenty of water. Water helps thin the mucus so your sinuses can drain more easily.  Use a humidifier.  Inhale steam 3 to 4 times a day (for example, sit in the bathroom with the shower running).  Apply a warm, moist washcloth to your face 3 to 4 times a day, or as directed by your health care provider.  Use saline nasal sprays to help moisten and clean your sinuses.  Take medicines only as directed by your health care provider.  If you were prescribed either an antibiotic or antifungal medicine, finish it all even if you start to feel better. SEEK IMMEDIATE  MEDICAL CARE IF:  You have increasing pain or severe headaches.  You have nausea, vomiting, or drowsiness.  You have swelling around your face.  You have vision problems.  You have a stiff neck.  You have difficulty breathing. MAKE SURE YOU:   Understand these instructions.  Will watch your condition.  Will get help right away if you are not doing well or get worse. Document Released: 03/15/2005 Document Revised: 07/30/2013 Document Reviewed: 03/30/2011 ExitCare Patient Information 2015 [employer]. This information is not intended to replace advice given to you by your health care provider. Make sure you discuss any questions you have with your health care provider.   

## 2015-02-21 NOTE — Assessment & Plan Note (Signed)
Symptoms and exam consistent with actual sinusitis. Start Augmentin. Start Tylenol 3 as needed for headaches. Continue over-the-counter medications as needed for symptom relief and supportive care. Follow-up if symptoms worsen or fail to improve.

## 2015-02-21 NOTE — Progress Notes (Signed)
TWILIGHT research study 4 month telephone follow up completed. Denies any adverse or bleeding events. 100% compliant with study medication, denies any use of open label ASA. Next appointment due end of [facility] for resupply of Study Drug. Questions encouraged and answered.

## 2015-02-24 ENCOUNTER — Encounter (HOSPITAL_COMMUNITY)
Admission: RE | Admit: 2015-02-24 | Discharge: 2015-02-24 | Disposition: A | Discharge status: Home / Self Care | Payer: 59 | Source: Ambulatory Visit | Attending: Cardiology | Admitting: Cardiology

## 2015-02-24 DIAGNOSIS — I252 Old myocardial infarction: Secondary | ICD-10-CM | POA: Diagnosis not present

## 2015-02-26 ENCOUNTER — Encounter (HOSPITAL_COMMUNITY)
Admission: RE | Admit: 2015-02-26 | Discharge: 2015-02-26 | Disposition: A | Discharge status: Home / Self Care | Payer: 59 | Source: Ambulatory Visit | Attending: Cardiology | Admitting: Cardiology

## 2015-02-26 DIAGNOSIS — I252 Old myocardial infarction: Secondary | ICD-10-CM | POA: Diagnosis not present

## 2015-02-28 ENCOUNTER — Encounter (HOSPITAL_COMMUNITY)
Admission: RE | Admit: 2015-02-28 | Discharge: 2015-02-28 | Disposition: A | Discharge status: Home / Self Care | Payer: 59 | Source: Ambulatory Visit | Attending: Cardiology | Admitting: Cardiology

## 2015-02-28 DIAGNOSIS — E785 Hyperlipidemia, unspecified: Secondary | ICD-10-CM | POA: Insufficient documentation

## 2015-02-28 DIAGNOSIS — I252 Old myocardial infarction: Secondary | ICD-10-CM | POA: Diagnosis present

## 2015-02-28 DIAGNOSIS — I2510 Atherosclerotic heart disease of native coronary artery without angina pectoris: Secondary | ICD-10-CM | POA: Diagnosis not present

## 2015-03-03 ENCOUNTER — Encounter (HOSPITAL_COMMUNITY)
Admission: RE | Admit: 2015-03-03 | Discharge: 2015-03-03 | Disposition: A | Discharge status: Home / Self Care | Payer: 59 | Source: Ambulatory Visit | Attending: Cardiology | Admitting: Cardiology

## 2015-03-03 DIAGNOSIS — I252 Old myocardial infarction: Secondary | ICD-10-CM | POA: Diagnosis not present

## 2015-03-03 NOTE — Progress Notes (Signed)
Pt graduated from cardiac rehab program today with completion of 36 exercise sessions in Phase II. Pt maintained good attendance and progressed nicely during his participation in rehab as evidenced by increased MET level. Pt increased 4.0 to 6.2.  Pt unable to attend education classes due to his work schedule. Offered handouts to pt, these were declined.  Medication list reconciled. Repeat  PHQ score-0.  Pt made improvements in his quality of life survey scores Overall increased to 21.27, health and function increased to 22.17, socioeconomic increased to 21.08, Psychological and Spiritual increased to 16.07 and Family 26.10.  Pt made significant increases in his outlook in life and seen interacting with fellow participants more.Pt has made significant lifestyle changes and should be commended for his success. Pt feels he has achieved his goals during cardiac rehab. Pt feels he is stronger and back to doing activities from prior to his cardiac event.  Pt did not have a reoccurrence while here in rehab.  Pt plans to continue exercise with gym at home, fitness center for [employer] and group exercise classes.  It was indeed a pleasure to see this patient transform from the beginning of his participation to the present. Cherre Huger, BSN

## 2015-03-05 ENCOUNTER — Encounter (HOSPITAL_COMMUNITY): Payer: 59

## 2015-03-07 ENCOUNTER — Encounter (HOSPITAL_COMMUNITY): Payer: 59

## 2015-03-10 ENCOUNTER — Encounter (HOSPITAL_COMMUNITY): Payer: 59

## 2015-03-12 ENCOUNTER — Encounter (HOSPITAL_COMMUNITY): Payer: 59

## 2015-03-13 ENCOUNTER — Ambulatory Visit (INDEPENDENT_AMBULATORY_CARE_PROVIDER_SITE_OTHER): Payer: 59 | Admitting: Cardiology

## 2015-03-13 ENCOUNTER — Encounter: Payer: Self-pay | Admitting: Cardiology

## 2015-03-13 VITALS — BP 122/90 | HR 82 | Ht 71.0 in | Wt 194.6 lb

## 2015-03-13 DIAGNOSIS — I2510 Atherosclerotic heart disease of native coronary artery without angina pectoris: Secondary | ICD-10-CM | POA: Diagnosis not present

## 2015-03-13 DIAGNOSIS — E785 Hyperlipidemia, unspecified: Secondary | ICD-10-CM

## 2015-03-13 DIAGNOSIS — R0609 Other forms of dyspnea: Secondary | ICD-10-CM

## 2015-03-13 DIAGNOSIS — I2583 Coronary atherosclerosis due to lipid rich plaque: Principal | ICD-10-CM

## 2015-03-13 LAB — HEPATIC FUNCTION PANEL
ALBUMIN: 4.2 g/dL (ref 3.6–5.1)
ALT: 32 U/L (ref 9–46)
AST: 22 U/L (ref 10–35)
Alkaline Phosphatase: 58 U/L (ref 40–115)
BILIRUBIN DIRECT: 0.1 mg/dL (ref <=0.2)
BILIRUBIN TOTAL: 0.8 mg/dL (ref 0.2–1.2)
Indirect Bilirubin: 0.7 mg/dL (ref 0.2–1.2)
Total Protein: 6.9 g/dL (ref 6.1–8.1)

## 2015-03-13 LAB — LIPID PANEL
CHOL/HDL RATIO: 2.1 ratio (ref <=5.0)
CHOLESTEROL: 119 mg/dL — AB (ref 125–200)
HDL: 56 mg/dL (ref >=40)
LDL Cholesterol: 51 mg/dL (ref <130)
TRIGLYCERIDES: 62 mg/dL (ref <150)
VLDL: 12 mg/dL (ref <30)

## 2015-03-13 NOTE — Patient Instructions (Signed)
Medication Instructions:  Your physician recommends that you continue on your current medications as directed. Please refer to the Current Medication list given to you today.   Labwork: TODAY: LFTs, Lipids   Testing/Procedures: None  Follow-Up: Your physician wants you to follow-up in: 6 months with Dr. Turner. You will receive a reminder letter in the mail two months in advance. If you don't receive a letter, please call our office to schedule the follow-up appointment.   Any Other Special Instructions Will Be Listed Below (If Applicable).     If you need a refill on your cardiac medications before your next appointment, please call your pharmacy.   

## 2015-03-13 NOTE — Progress Notes (Signed)
Cardiology Office Note   Date:  03/13/2015   ID:  Isaac Patel, DOB [birth year], MRN [id number]  PCP:  Mauricio Po, FNP    Chief Complaint  Patient presents with  . Coronary Artery Disease  . Hyperlipidemia      History of Present Illness: Isaac Patel is a 49 y.o. male who was admitted 8/1-8/3 with a NSTEMI. LHC demonstrated 80% proximal LAD stenosis that was treated with a Resolute DES. Echo demonstrated normal LVF. He returns for FU.    He denies syncope. He denies exertional chest pain or shortness of breath. He was seen by the extender in August after his MI complaining of dyspnea at different times.  It was felt to be related to Brilinta but he has continued on the Twilight study trial and the SOB has resolved.   He presents today doing well.  He denies any dizziness, palpitations, LE edema, claudication or syncope.  He has completed cardiac rehab.   Past Medical History  Diagnosis Date  . GERD (gastroesophageal reflux disease)   . NSTEMI (non-ST elevated myocardial infarction) ([place]) 10/28/2014  . OSA (obstructive sleep apnea)     "tried mask; wear it off and on" (10/28/2014)  . Headache     "maybe monthly" (10/28/2014)  . Depression     hx  . CAD (coronary artery disease)     a.  Non-STEMI 8/16: LHC-proximal LAD 80% treated with a resolute DES, EF 50-55%;  b.  Echo 8/16:  Moderate LVH, EF 55-60%, normal wall motion, normal diastolic function, normal RV function  . HLD (hyperlipidemia)     Past Surgical History  Procedure Laterality Date  . 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 Tympanostomy tube placement Bilateral "as a kid"    "had one done as an adult too"  . Cholesteatoma excision Right 1990's  . Cardiac catheterization N/A 10/29/2014    Procedure: Left Heart Cath and Coronary Angiography;  Surgeon: Troy Sine, MD;  Location: [facility] [place] [facility];  Service: Cardiovascular;  Laterality: N/A;     Current  Outpatient Prescriptions  Medication Sig Dispense Refill  . AMBULATORY NON FORMULARY MEDICATION Take 90 mg by mouth 2 (two) times daily. Medication Name: Brilinta 90 mg BID (Provided by TWILIGHT Research Study* Do NOT Fill)    . AMBULATORY NON FORMULARY MEDICATION Take 81 mg by mouth daily. Medication Name: ASA 81 mg Daily or PLACEBO (TWILIGHT Research study provided, NO open label ASA)    . atorvastatin (LIPITOR) 80 MG tablet Take 1 tablet (80 mg total) by mouth daily at 6 PM. 30 tablet 11  . clonazePAM (KLONOPIN) 0.5 MG tablet Take 1 tablet (0.5 mg total) by mouth 2 (two) times daily as needed for anxiety. 60 tablet 0  . fluticasone (FLONASE) 50 MCG/ACT nasal spray Place into both nostrils daily.    . nitroGLYCERIN (NITROSTAT) 0.4 MG SL tablet Place 1 tablet (0.4 mg total) under the tongue every 5 (five) minutes x 3 doses as needed for chest pain. 25 tablet 12  . nystatin cream (MYCOSTATIN) Apply 1 application topically 2 (two) times daily. (Patient taking differently: Apply 1 application topically 2 (two) times daily as needed (as needed for rash). ) 30 g 0  . pantoprazole (PROTONIX) 40 MG tablet Take 1 tablet (40 mg total) by mouth daily. 90 tablet 3   No current facility-administered medications for this  visit.    Allergies:   Morphine and related    Social History:  The patient  reports that he has been smoking Cigarettes.  He has a 32 pack-year smoking history. He has never used smokeless tobacco. He reports that he drinks about 9.6 - 10.2 oz of alcohol per week. He reports that he uses illicit drugs (Marijuana).   Family History:  The patient's family history includes Healthy in his father and mother; Multiple sclerosis in his maternal grandfather.    ROS:  Please see the history of present illness.   Otherwise, review of systems are positive for none.   All other systems are reviewed and negative.    PHYSICAL EXAM: VS:  BP 122/90 mmHg  Pulse 82  Ht 5\' 11"  (1.803 m)  Wt 194 lb  9.6 oz (88.27 kg)  BMI 27.15 kg/m2  SpO2 98% , BMI Body mass index is 27.15 kg/(m^2). GEN: Well nourished, well developed, in no acute distress HEENT: normal Neck: no JVD, carotid bruits, or masses Cardiac: RRR; no murmurs, rubs, or gallops,no edema  Respiratory:  clear to auscultation bilaterally, normal work of breathing GI: soft, nontender, nondistended, + BS MS: no deformity or atrophy Skin: warm and dry, no rash Neuro:  Strength and sensation are intact Psych: euthymic mood, full affect   EKG:  EKG is not ordered today.    Recent Labs: 10/28/2014: ALT 16*; Magnesium 2.0; TSH 2.301 11/12/2014: BUN 16; Creatinine, Ser 1.04; Hemoglobin 15.7; Platelets 264.0; Potassium 4.2; Sodium 139    Lipid Panel    Component Value Date/Time   CHOL 166 10/29/2014 0455   TRIG 51 10/29/2014 0455   HDL 44 10/29/2014 0455   CHOLHDL 3.8 10/29/2014 0455   VLDL 10 10/29/2014 0455   LDLCALC 112* 10/29/2014 0455      Wt Readings from Last 3 Encounters:  03/13/15 194 lb 9.6 oz (88.27 kg)  02/21/15 194 lb (87.998 kg)  12/18/14 192 lb 12.8 oz (87.454 kg)    ASSESSMENT AND PLAN:  Coronary artery disease: Status post non-STEMI treated with a DES to the LAD. EF overall preserved.  Continue ASA/Brilinita and statin.   HLD (hyperlipidemia): Continue statin. We will need to arrange lipids and LFTs   Tobacco abuse: He has quit smoking.  Shortness of breath: This is likely a side effect of Brilinta and has resolved despite staying on Brilinta.    Current medicines are reviewed at length with the patient today.  The patient does not have concerns regarding medicines.  The following changes have been made:  no change  Labs/ tests ordered today: See above Assessment and Plan No orders of the defined types were placed in this encounter.     Disposition:   FU with me in 6 months  Signed, Sueanne Margarita, MD  03/13/2015 8:39 AM    [facility] [place],  [place], [place]  [address] Phone: [phone]; Fax: [phone]

## 2015-03-14 ENCOUNTER — Encounter (HOSPITAL_COMMUNITY): Payer: 59

## 2015-04-21 ENCOUNTER — Telehealth: Payer: Self-pay

## 2015-04-21 DIAGNOSIS — F411 Generalized anxiety disorder: Secondary | ICD-10-CM

## 2015-04-21 NOTE — Telephone Encounter (Signed)
Pt requesting refill of Clonazepam 0.5 mg last refill was 11/15/14. Please advise    [facility]

## 2015-04-22 ENCOUNTER — Other Ambulatory Visit: Payer: Self-pay

## 2015-04-22 DIAGNOSIS — F411 Generalized anxiety disorder: Secondary | ICD-10-CM

## 2015-04-22 MED ORDER — CLONAZEPAM 0.5 MG PO TABS: 0.5000 mg | ORAL_TABLET | Freq: Two times a day (BID) | ORAL | Status: DC | PRN

## 2015-04-22 NOTE — Telephone Encounter (Signed)
Rx faxed

## 2015-04-22 NOTE — Telephone Encounter (Signed)
Refill printed and signed

## 2015-07-10 ENCOUNTER — Other Ambulatory Visit: Payer: Self-pay | Admitting: Family

## 2015-07-23 ENCOUNTER — Encounter: Payer: Self-pay | Admitting: *Deleted

## 2015-07-23 DIAGNOSIS — Z006 Encounter for examination for normal comparison and control in clinical research program: Secondary | ICD-10-CM

## 2015-07-24 NOTE — Progress Notes (Signed)
TWILIGHT Research study month 9 visit completed. Patient denies bleeding or any other adverse events. He also states compliant with medication, "may have missed a few doses". Dispensed bottle numbers [id number]. Patient returned unused medication from previous dispensing. Next visit window will be 26/sep/17-25/nov/17, this will be the end of study visit.. Further antiplatelet therapy will be at the discretion of cardiologist. Will schedule  Combined visit with research and cardiologist or send epic message before end of study. Questions encouraged and answered.

## 2015-09-15 DIAGNOSIS — G4733 Obstructive sleep apnea (adult) (pediatric): Secondary | ICD-10-CM | POA: Diagnosis not present

## 2015-10-09 ENCOUNTER — Ambulatory Visit (INDEPENDENT_AMBULATORY_CARE_PROVIDER_SITE_OTHER): Payer: 59 | Admitting: Family

## 2015-10-09 ENCOUNTER — Encounter: Payer: Self-pay | Admitting: Family

## 2015-10-09 VITALS — BP 112/70 | HR 63 | Temp 97.8°F | Resp 16 | Ht 71.0 in | Wt 201.8 lb

## 2015-10-09 DIAGNOSIS — S68412S Complete traumatic amputation of left hand at wrist level, sequela: Secondary | ICD-10-CM

## 2015-10-09 DIAGNOSIS — L989 Disorder of the skin and subcutaneous tissue, unspecified: Secondary | ICD-10-CM

## 2015-10-09 DIAGNOSIS — S58112S Complete traumatic amputation at level between elbow and wrist, left arm, sequela: Secondary | ICD-10-CM | POA: Diagnosis not present

## 2015-10-09 DIAGNOSIS — S58119A Complete traumatic amputation at level between elbow and wrist, unspecified arm, initial encounter: Secondary | ICD-10-CM | POA: Insufficient documentation

## 2015-10-09 NOTE — Assessment & Plan Note (Addendum)
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 Refer to dermatology for possible biopsy for further evaluation.

## 2015-10-09 NOTE — Patient Instructions (Addendum)
Thank you for choosing [facility].  Summary/Instructions:  They will call to schedule your dermatology appointment.   Your prescription(s) have been submitted to your pharmacy or been printed and provided for you. Please take as directed and contact our office if you believe you are having problem(s) with the medication(s) or have any questions.  If your symptoms worsen or fail to improve, please contact our office for further instruction, or in case of emergency go directly to the emergency room at the closest medical facility.

## 2015-10-09 NOTE — Progress Notes (Signed)
Subjective:    [name], male    DOB: Apr 11, 1965, 50 y.o.   MRN: [id number]  Chief Complaint  Patient presents with  . Follow-up    needs rx written for new prostetic hand, bump on face, referral to GI for colonscopy and derm     HPI:  Isaac Patel is a 50 y.o. male who  has a past medical history of GERD (gastroesophageal reflux disease); NSTEMI (non-ST elevated myocardial infarction) ([place]) (10/28/2014); OSA (obstructive sleep apnea); Headache; Depression; CAD (coronary artery disease); and HLD (hyperlipidemia). and presents today for a follow up office visit.   1.) Lesion of face - This is a new problem. Associated symptom of a lesion located on the left side of his face has been going on for about 1 month and has noted some changes in size. Mildly tender at times. There are no modifying factors that make it better or worse. Would like referral to dermatology.  2.) Left arm prosthesis - Requesting a new prescription for a left arm prosthesis. Notes the motor in his most recent prosthetic burnt out and had decreased grip strength making which does not allow him to complete his work activities. It was evaluated by [employer] and deemed non-repairable because it is greater than 15 years of age.   Allergies  Allergen Reactions  . Morphine And Related Itching     Current Outpatient Prescriptions on File Prior to Visit  Medication Sig Dispense Refill  . AMBULATORY NON FORMULARY MEDICATION Take 90 mg by mouth 2 (two) times daily. Medication Name: Brilinta 90 mg BID (Provided by TWILIGHT Research Study* Do NOT Fill)    . AMBULATORY NON FORMULARY MEDICATION Take 81 mg by mouth daily. Medication Name: ASA 81 mg Daily or PLACEBO (TWILIGHT Research study provided, NO open label ASA)    . atorvastatin (LIPITOR) 80 MG tablet Take 1 tablet (80 mg total) by mouth daily at 6 PM. 30 tablet 11  . fluticasone (FLONASE) 50 MCG/ACT nasal spray Place into both nostrils daily.     . nitroGLYCERIN (NITROSTAT) 0.4 MG SL tablet Place 1 tablet (0.4 mg total) under the tongue every 5 (five) minutes x 3 doses as needed for chest pain. 25 tablet 12  . nystatin cream (MYCOSTATIN) Apply 1 application topically 2 (two) times daily. (Patient taking differently: Apply 1 application topically 2 (two) times daily as needed (as needed for rash). ) 30 g 0  . pantoprazole (PROTONIX) 40 MG tablet TAKE 1 TABLET BY MOUTH ONCE DAILY 90 tablet 1   No current facility-administered medications on file prior to visit.    Past Medical History  Diagnosis Date  . GERD (gastroesophageal reflux disease)   . NSTEMI (non-ST elevated myocardial infarction) ([place]) 10/28/2014  . OSA (obstructive sleep apnea)     "tried mask; wear it off and on" (10/28/2014)  . Headache     "maybe monthly" (10/28/2014)  . Depression     hx  . CAD (coronary artery disease)     a.  Non-STEMI 8/16: LHC-proximal LAD 80% treated with a resolute DES, EF 50-55%;  b.  Echo 8/16:  Moderate LVH, EF 55-60%, normal wall motion, normal diastolic function, normal RV function  . HLD (hyperlipidemia)     Review of Systems  Constitutional: Negative for fever and chills.  Musculoskeletal:       Positive for left BEA  Skin:       Positive for lesion on face.   Neurological: Positive for weakness. Negative  for numbness.      Objective:    BP 112/70 mmHg  Pulse 63  Temp(Src) 97.8 F (36.6 C) (Oral)  Resp 16  Ht 5\' 11"  (1.803 m)  Wt 201 lb 12.8 oz (91.536 kg)  BMI 28.16 kg/m2  SpO2 98% Nursing note and vital signs reviewed.  Physical Exam  Constitutional: He is oriented to person, place, and time. He appears well-developed and well-nourished. No distress.  Cardiovascular: Normal rate, regular rhythm, normal heart sounds and intact distal pulses.   Pulmonary/Chest: Effort normal and breath sounds normal.  Musculoskeletal:  BEA noted.   Neurological: He is alert and oriented to person, place, and time.  Skin: Skin is warm  and dry.  Approximately 0.5 cm raised, firm, annular lesion with clear demarcated borders and appears like it has been picked at is located on the left side of his forehead lateral to his eyebrow.   Psychiatric: He has a normal mood and affect. His behavior is normal. Judgment and thought content normal.       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 Refer to dermatology for possible biopsy for further evaluation.       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 Current [place] Sensor Hand has malfunctioning motor and appears to be in disrepair and inability to perform job functions with current status. Written prescription for new prosthetic written for replacement.           I have discontinued [name] clonazePAM. I am also having him maintain his fluticasone, nystatin cream, atorvastatin, nitroGLYCERIN, AMBULATORY NON FORMULARY MEDICATION, AMBULATORY NON FORMULARY MEDICATION, and pantoprazole.   Follow-up: Return if symptoms worsen or fail to improve.  Mauricio Po, FNP

## 2015-10-09 NOTE — Assessment & Plan Note (Signed)
Previously experienced BEA secondary to an industrial accident that is treated with a prosthetic. Current [employer] Sensor Hand has malfunctioning motor and appears to be in disrepair and inability to perform job functions with current status. Written prescription for new prosthetic written for replacement.

## 2015-10-23 ENCOUNTER — Encounter: Payer: Self-pay | Admitting: Family

## 2015-11-04 ENCOUNTER — Ambulatory Visit (INDEPENDENT_AMBULATORY_CARE_PROVIDER_SITE_OTHER): Payer: 59 | Admitting: Adult Health

## 2015-11-04 ENCOUNTER — Encounter: Payer: Self-pay | Admitting: Adult Health

## 2015-11-04 DIAGNOSIS — G4733 Obstructive sleep apnea (adult) (pediatric): Secondary | ICD-10-CM | POA: Diagnosis not present

## 2015-11-04 NOTE — Assessment & Plan Note (Signed)
Severe OSA controlled on CPAP  Requests new machine   Plan  Patient Instructions  Continue on CPAP At bedtime   Order for new CPAP machine and mask fitting .  Wear for at least 4-6 hr each night .  Do not drive if sleepy.  Follow up with Dr. Elsworth Soho  In 1 year and As needed

## 2015-11-04 NOTE — Patient Instructions (Signed)
Continue on CPAP At bedtime   Order for new CPAP machine and mask fitting .  Wear for at least 4-6 hr each night .  Do not drive if sleepy.  Follow up with Dr. Elsworth Soho  In 1 year and As needed

## 2015-11-04 NOTE — Addendum Note (Signed)
Addended by: Osa Craver on: 11/04/2015 05:12 PM   Modules accepted: Orders

## 2015-11-04 NOTE — Progress Notes (Signed)
Subjective:    [name], male    DOB: Sep 09, 1965, 50 y.o.   MRN: [id number]  HPI 50 year old male followed for obstructive sleep apnea and nocturnal C Pap  TEST  05/2011 HST - AHI 38/h  11/04/2015 [place] sleep apnea Patient presents for a one-year follow-up for severe sleep apnea. He is doing well on his C Pap. Wears on average 4-5 hours each night. Denies any significant daytime sleepiness. Patient denies any chest pain, orthopnea, PND, or increased leg swelling C Pap Download requested at DME.  He wants to get new machine , says his is very old.  Also wants to go for a mask fitting .      Past Medical History:  Diagnosis Date  . CAD (coronary artery disease)    a.  Non-STEMI 8/16: LHC-proximal LAD 80% treated with a resolute DES, EF 50-55%;  b.  Echo 8/16:  Moderate LVH, EF 55-60%, normal wall motion, normal diastolic function, normal RV function  . Depression    hx  . GERD (gastroesophageal reflux disease)   . Headache    "maybe monthly" (10/28/2014)  . HLD (hyperlipidemia)   . NSTEMI (non-ST elevated myocardial infarction) ([facility]) 10/28/2014  . OSA (obstructive sleep apnea)    "tried mask; wear it off and on" (10/28/2014)  ' Current Outpatient Prescriptions on File Prior to Visit  Medication Sig Dispense Refill  . AMBULATORY NON FORMULARY MEDICATION Take 90 mg by mouth 2 (two) times daily. Medication Name: Brilinta 90 mg BID (Provided by TWILIGHT Research Study* Do NOT Fill)    . AMBULATORY NON FORMULARY MEDICATION Take 81 mg by mouth daily. Medication Name: ASA 81 mg Daily or PLACEBO (TWILIGHT Research study provided, NO open label ASA)    . atorvastatin (LIPITOR) 80 MG tablet Take 1 tablet (80 mg total) by mouth daily at 6 PM. 30 tablet 11  . fluticasone (FLONASE) 50 MCG/ACT nasal spray Place into both nostrils daily.    . nitroGLYCERIN (NITROSTAT) 0.4 MG SL tablet Place 1 tablet (0.4 mg total) under the tongue every 5 (five) minutes x 3 doses as needed for  chest pain. 25 tablet 12  . nystatin cream (MYCOSTATIN) Apply 1 application topically 2 (two) times daily. (Patient taking differently: Apply 1 application topically 2 (two) times daily as needed (as needed for rash). ) 30 g 0  . pantoprazole (PROTONIX) 40 MG tablet TAKE 1 TABLET BY MOUTH ONCE DAILY 90 tablet 1   No current facility-administered medications on file prior to visit.     Review of Systems Constitutional:   No  weight loss, night sweats,  Fevers, chills, fatigue, or  lassitude.  HEENT:   No headaches,  Difficulty swallowing,  Tooth/dental problems, or  Sore throat,                No sneezing, itching, ear ache, nasal congestion, post nasal drip,   CV:  No chest pain,  Orthopnea, PND, swelling in lower extremities, anasarca, dizziness, palpitations, syncope.   GI  No heartburn, indigestion, abdominal pain, nausea, vomiting, diarrhea, change in bowel habits, loss of appetite, bloody stools.   Resp: No shortness of breath with exertion or at rest.  No excess mucus, no productive cough,  No non-productive cough,  No coughing up of blood.  No change in color of mucus.  No wheezing.  No chest wall deformity  Skin: no rash or lesions.  GU: no dysuria, change in color of urine, no urgency or frequency.  No  flank pain, no hematuria   MS:  No joint pain or swelling.  No decreased range of motion.  No back pain.  Psych:  No change in mood or affect. No depression or anxiety.  No memory loss.         Objective:   Physical Exam  Vitals:   11/04/15 1624  BP: 126/80  Pulse: 91  SpO2: 99%  Weight: 204 lb (92.5 kg)  Height: 5\' 11"  (1.803 m)   Body mass index is 28.45 kg/m.   GEN: A/Ox3; pleasant , NAD, well nourished    HEENT:  [place]/AT,  EACs-clear, TMs-wnl, NOSE-clear, THROAT-clear, no lesions, no postnasal drip or exudate noted. Class 2- MP airway   NECK:  Supple w/ fair ROM; no JVD; normal carotid impulses w/o bruits; no thyromegaly or nodules palpated; no  lymphadenopathy.    RESP  Clear  P & A; w/o, wheezes/ rales/ or rhonchi. no accessory muscle use, no dullness to percussion  CARD:  RRR, no m/r/g  , no peripheral edema, pulses intact, no cyanosis or clubbing.  GI:   Soft & nt; nml bowel sounds; no organomegaly or masses detected.   Musco: Warm bil, left arm prosthesis   Neuro: alert, no focal deficits noted.    Skin: Warm, no lesions or rashes     Isaac Dommer NP-C  [facility]  11/04/2015

## 2015-11-11 ENCOUNTER — Encounter: Payer: Self-pay | Admitting: Adult Health

## 2015-11-11 ENCOUNTER — Telehealth: Payer: Self-pay | Admitting: *Deleted

## 2015-11-11 NOTE — Telephone Encounter (Signed)
MyChart Message:  Hi [name],  I was contacted by [facility].They informed me that I am not eligible for any new equipment until January 2018.I could schedule a mask fitting but would have to pay out of pocket for any equipment until then.They said they had not received any documentation regarding a new machine.I explained that I had a conversation with you regarding a new machine.They said they would send you a form and that they would need a script with a pressure reading from my current machine.I will retrieve my SD card and bring it by your office as soon as possible.Hopefully we can get this ball rolling in the right direction.  Sincerely,  Isaac Patel ------------------------------- Order for CPAP and for mask fitting was submitted on 11/04/15.  Will send to [facility] to look for download once patient brings by his SD card.

## 2015-12-02 DIAGNOSIS — D229 Melanocytic nevi, unspecified: Secondary | ICD-10-CM | POA: Diagnosis not present

## 2015-12-02 DIAGNOSIS — L821 Other seborrheic keratosis: Secondary | ICD-10-CM | POA: Diagnosis not present

## 2015-12-02 DIAGNOSIS — D2330 Other benign neoplasm of skin of unspecified part of face: Secondary | ICD-10-CM | POA: Diagnosis not present

## 2015-12-02 DIAGNOSIS — D1800 Hemangioma unspecified site: Secondary | ICD-10-CM | POA: Diagnosis not present

## 2015-12-19 ENCOUNTER — Other Ambulatory Visit: Payer: Self-pay | Admitting: *Deleted

## 2015-12-19 MED ORDER — ATORVASTATIN CALCIUM 80 MG PO TABS: 80.0000 mg | ORAL_TABLET | Freq: Every day | ORAL | 2 refills | Status: DC

## 2016-01-30 ENCOUNTER — Telehealth: Payer: Self-pay

## 2016-01-30 NOTE — Telephone Encounter (Signed)
Left message to call back  

## 2016-01-30 NOTE — Telephone Encounter (Signed)
-----   Message from Sueanne Margarita, MD sent at 01/22/2016  3:31 PM EDT ----- Regarding: RE: End of Treatment TWILIGHT Reserach Study Contact: [phone] Please have patient start Brilinta 60mg  BID and ASA 81mg  daily starting 02/04/2016 which will be at the end of Twilight study  Traci ----- Message ----- From: Karilyn Cota, RN Sent: 01/22/2016   9:04 AM To: Sueanne Margarita, MD, Jackqulyn Livings, RN Subject: End of Treatment TWILIGHT Reserach Study       Dr. Radford Pax,  Isaac Patel has an appointment with research 02/04/16 @ 4:30 pm. This is his End of treatment for the TWILIGHT research study. He will no longer receive Brilinta or ASA/Placebo. Further antiplatelet therapy will be at your discretion (Including ASA) and prescription will need to be sent to his pharmacy.   Please let me know your decision and I will Educate/instruct patient at this visit.  Thank you, Tammy

## 2016-02-03 MED ORDER — TICAGRELOR 60 MG PO TABS: 60.0000 mg | ORAL_TABLET | Freq: Two times a day (BID) | ORAL | 11 refills | Status: DC

## 2016-02-03 MED ORDER — ASPIRIN 81 MG PO TABS: 81.0000 mg | ORAL_TABLET | Freq: Every day | ORAL | Status: DC

## 2016-02-03 NOTE — Telephone Encounter (Signed)
Instructed patient to START BRILINTA 60 mg BID and ASA 81 mg daily starting 02/04/2016. Per patient request, scheduled him with Richardson Dopp next Monday for overdue OV. He was grateful for call.

## 2016-02-03 NOTE — Telephone Encounter (Signed)
Left message to call back  

## 2016-02-04 ENCOUNTER — Encounter: Payer: Self-pay | Admitting: *Deleted

## 2016-02-04 ENCOUNTER — Other Ambulatory Visit: Payer: Self-pay | Admitting: *Deleted

## 2016-02-04 DIAGNOSIS — Z006 Encounter for examination for normal comparison and control in clinical research program: Secondary | ICD-10-CM

## 2016-02-04 NOTE — Progress Notes (Signed)
TWILIGHT Research study month 15 visit completed. Patient denies any bleeding events or other adverse events. He states he has been compliant with medication. After pill count he was 82.65% compliant with ASA/PLACEBO and 78.83 % compliant with Brilinta. He has been started on Brilinta 60 mg BID and ASA 81 mg Daily and he is going to get script today. Next research required visit will be no later than 07/feb/2018 and this will be a telephone call and this will be the End of the TWILIGHT study. Questions encouraged and answered.

## 2016-02-09 ENCOUNTER — Ambulatory Visit (INDEPENDENT_AMBULATORY_CARE_PROVIDER_SITE_OTHER): Payer: 59 | Admitting: Physician Assistant

## 2016-02-09 ENCOUNTER — Encounter: Payer: Self-pay | Admitting: Physician Assistant

## 2016-02-09 VITALS — BP 124/88 | HR 80 | Ht 71.0 in | Wt 207.4 lb

## 2016-02-09 DIAGNOSIS — E7800 Pure hypercholesterolemia, unspecified: Secondary | ICD-10-CM

## 2016-02-09 DIAGNOSIS — Z720 Tobacco use: Secondary | ICD-10-CM

## 2016-02-09 DIAGNOSIS — I2510 Atherosclerotic heart disease of native coronary artery without angina pectoris: Secondary | ICD-10-CM | POA: Diagnosis not present

## 2016-02-09 NOTE — Patient Instructions (Addendum)
Medication Instructions:  Your physician recommends that you continue on your current medications as directed. Please refer to the Current Medication list given to you today.  Labwork: GET FASTING LIPID AND LIVER PANEL SOMETIME AFTER December 2017 WITH PRIMARY CARE AND HAVE THE RESULTS FAXED TO [place], [place]  Testing/Procedures: NONE  Follow-Up: Your physician wants you to follow-up in: [place] will receive a reminder letter in the mail two months in advance. If you don't receive a letter, please call our office to schedule the follow-up appointment.  Any Other Special Instructions Will Be Listed Below (If Applicable).  If you need a refill on your cardiac medications before your next appointment, please call your pharmacy.

## 2016-02-09 NOTE — Progress Notes (Signed)
Cardiology Office Note:    Date:  02/09/2016   ID:  [name], DOB [birth year], MRN [id number]  PCP:  Mauricio Po, FNP  Cardiologist:  Dr. [name]   Electrophysiologist:  n/a  Referring MD: Golden Circle, FNP   Chief Complaint  Patient presents with  . Follow-up    CAD    History of Present Illness:    Isaac Patel is a 50 y.o. male with a hx of CAD s/p NSTEMI in 8/16 tx with DES to the proximal LAD.  He had significant issues with anxiety after his myocardial infarction and these improved with Klonopin. Last seen here by Dr. [name] in 12/16.  He was followed as part of the TWILIGHT protocol.  He came off study drug 02/04/16.  Dr. Radford Pax put him on Brilinta 60 mg bid and ASA 81 mg QD. He returns for FU.    He has been doing well.  The patient denies chest pain, shortness of breath, syncope, orthopnea, PND or significant pedal edema.    Prior CV studies that were reviewed today include:    Echo 10/30/14 Moderate LVH, EF 55-60%, normal wall motion, normal diastolic function, normal RV function  LHC 10/29/14 LAD: Proximal-mid 80% EF 50-55% with small focal region of mild mid anterolateral hypokinesis PCI: 3 x 15 mm resolute DES to the LAD   Past Medical History:  Diagnosis Date  . CAD (coronary artery disease)    a.  Non-STEMI 8/16: LHC-proximal LAD 80% treated with a resolute DES, EF 50-55%;  b.  Echo 8/16:  Moderate LVH, EF 55-60%, normal wall motion, normal diastolic function, normal RV function  . Depression    hx  . GERD (gastroesophageal reflux disease)   . Headache    "maybe monthly" (10/28/2014)  . HLD (hyperlipidemia)   . NSTEMI (non-ST elevated myocardial infarction) ([facility]) 10/28/2014  . OSA (obstructive sleep apnea)    "tried mask; wear it off and on" (10/28/2014)    Past Surgical History:  Procedure Laterality Date  . 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 CARDIAC CATHETERIZATION N/A 10/29/2014   Procedure: Left  Heart Cath and Coronary Angiography;  Surgeon: Troy Sine, MD;  Location: [facility];  Service: Cardiovascular;  Laterality: N/A;  . CHOLESTEATOMA EXCISION Right 1990's  . TYMPANOSTOMY TUBE PLACEMENT Bilateral "as a kid"   "had one done as an adult too"    Current Medications: Current Meds  Medication Sig  . aspirin 81 MG tablet Take 1 tablet (81 mg total) by mouth daily.  Marland Kitchen atorvastatin (LIPITOR) 80 MG tablet Take 1 tablet (80 mg total) by mouth daily at 6 PM.  . fluticasone (FLONASE) 50 MCG/ACT nasal spray Place 2 sprays into both nostrils daily as needed for allergies or rhinitis.   Marland Kitchen nitroGLYCERIN (NITROSTAT) 0.4 MG SL tablet Place 1 tablet (0.4 mg total) under the tongue every 5 (five) minutes x 3 doses as needed for chest pain.  Marland Kitchen nystatin cream (MYCOSTATIN) Apply 1 application topically 2 (two) times daily as needed for dry skin (skin rash).   . pantoprazole (PROTONIX) 40 MG tablet TAKE 1 TABLET BY MOUTH ONCE DAILY  . ticagrelor (BRILINTA) 60 MG TABS tablet Take 1 tablet (60 mg total) by mouth 2 (two) times daily.     Allergies:   Morphine and related   Social History   Social History  . Marital status: Married    Spouse name: N/A  . Number of children:  2  . Years of education: 89   Occupational History  . CMA    Social History Main Topics  . Smoking status: Current Some Day Smoker    Packs/day: 1.00    Years: 32.00    Types: Cigarettes  . Smokeless tobacco: Never Used     Comment: 1 cigarette x week  . Alcohol use 9.6 - 10.2 oz/week    14 Cans of beer, 2 - 3 Standard drinks or equivalent per week  . Drug use:     Types: Marijuana     Comment: 10/28/2014 "stopped marijuana in the early 2000's"  . Sexual activity: Yes   Other Topics Concern  . None   Social History Narrative   Born and raised in [place], [place].  Currently resides in a house with his wife and children. 1 dog. Fun: play computer games, sports.    Denies any religious beliefs effecting  health care.    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   ROS:   Please see the history of present illness.    ROS All other systems reviewed and are negative.   EKGs/Labs/Other Test Reviewed:    EKG:  EKG is  ordered today.  The ekg ordered today demonstrates NSR, HR 80, normal axis, QTc 426 ms  Recent Labs: 03/13/2015: ALT 32   Recent Lipid Panel    Component Value Date/Time   CHOL 119 (L) 03/13/2015 0848   TRIG 62 03/13/2015 0848   HDL 56 03/13/2015 0848   CHOLHDL 2.1 03/13/2015 0848   VLDL 12 03/13/2015 0848   LDLCALC 51 03/13/2015 0848     Physical Exam:    VS:  BP 124/88   Pulse 80   Ht 5\' 11"  (1.803 m)   Wt 207 lb 6.4 oz (94.1 kg)   BMI 28.93 kg/m     Wt Readings from Last 3 Encounters:  02/09/16 207 lb 6.4 oz (94.1 kg)  11/04/15 204 lb (92.5 kg)  10/09/15 201 lb 12.8 oz (91.5 kg)     Physical Exam  Constitutional: He is oriented to person, place, and time. He appears well-developed and well-nourished. No distress.  HENT:  Head: Normocephalic and atraumatic.  Eyes: No scleral icterus.  Neck: No JVD present. Carotid bruit is not present.  Cardiovascular: Normal rate, regular rhythm and normal heart sounds.   No murmur heard. Pulses:      Dorsalis pedis pulses are 2+ on the right side, and 2+ on the left side.       Posterior tibial pulses are 2+ on the right side, and 2+ on the left side.  Pulmonary/Chest: Effort normal. He has no wheezes. He has no rales.  Abdominal: Soft. There is no tenderness.  Musculoskeletal: He exhibits no edema.  Neurological: He is alert and oriented to person, place, and time.  Skin: Skin is warm and dry.  Psychiatric: He has a normal mood and affect.    ASSESSMENT:    1. Coronary artery disease involving native coronary artery of native  heart without angina pectoris   2. Pure hypercholesterolemia   3. Tobacco abuse    PLAN:    In order of problems listed above:  1. CAD - s/p NSTEMI in 8/16 tx with DES to LAD. He completed the TWILIGHT protocol  and is now on Brilinta 60 bid and ASA 81 QD.  He is doing well without angina. Continue aspirin, Brilinta 60 mg twice a day and Lipitor.  2. HL - His LDL in 12/16 was 51. Continue Lipitor 80 mg daily. He can get follow-up lipids and LFTs with his PCP after December.  3. Tobacco abuse - He has resumed smoking on occasion. I have asked him to completely stop smoking.   Medication Adjustments/Labs and Tests Ordered: Current medicines are reviewed at length with the patient today.  Concerns regarding medicines are outlined above.  Medication changes, Labs and Tests ordered today are outlined in the Patient Instructions noted below. Patient Instructions  Medication Instructions:  Your physician recommends that you continue on your current medications as directed. Please refer to the Current Medication list given to you today.  Labwork: GET FASTING LIPID AND LIVER PANEL SOMETIME AFTER December 2017 WITH PRIMARY CARE AND HAVE THE RESULTS FAXED TO [place], [place]  Testing/Procedures: NONE  Follow-Up: Your physician wants you to follow-up in: [place] will receive a reminder letter in the mail two months in advance. If you don't receive a letter, please call our office to schedule the follow-up appointment.  Any Other Special Instructions Will Be Listed Below (If Applicable).  If you need a refill on your cardiac medications before your next appointment, please call your pharmacy.   Signed, Richardson Dopp, PA-C  02/09/2016 4:48 PM    [facility] [place] [place], [place],   [address] Phone: [phone]; Fax: [phone]

## 2016-03-24 ENCOUNTER — Encounter: Payer: Self-pay | Admitting: Physician Assistant

## 2016-03-24 ENCOUNTER — Other Ambulatory Visit: Payer: Self-pay | Admitting: *Deleted

## 2016-03-24 ENCOUNTER — Encounter: Payer: Self-pay | Admitting: Family

## 2016-03-24 MED ORDER — ATORVASTATIN CALCIUM 80 MG PO TABS: 80.0000 mg | ORAL_TABLET | Freq: Every day | ORAL | 3 refills | Status: DC

## 2016-03-24 MED ORDER — TICAGRELOR 60 MG PO TABS: 60.0000 mg | ORAL_TABLET | Freq: Two times a day (BID) | ORAL | 3 refills | Status: DC

## 2016-03-25 MED ORDER — PANTOPRAZOLE SODIUM 40 MG PO TBEC: 40.0000 mg | DELAYED_RELEASE_TABLET | Freq: Every day | ORAL | 1 refills | Status: DC

## 2016-04-12 DIAGNOSIS — G4733 Obstructive sleep apnea (adult) (pediatric): Secondary | ICD-10-CM | POA: Diagnosis not present

## 2016-04-15 ENCOUNTER — Encounter: Payer: Self-pay | Admitting: *Deleted

## 2016-04-15 DIAGNOSIS — Z006 Encounter for examination for normal comparison and control in clinical research program: Secondary | ICD-10-CM

## 2016-04-15 NOTE — Progress Notes (Signed)
TWILIGHT Research study month 18 telephone follow up completed. Patient states he has not had any issues and remains on Brilinta 60 mg BID and ASA 81 mg daily. I thanked him for participating in the research study.

## 2016-05-24 ENCOUNTER — Observation Stay (HOSPITAL_COMMUNITY)
Admission: EM | Admit: 2016-05-24 | Discharge: 2016-05-25 | Disposition: A | Discharge status: Home / Self Care | Payer: 59 | Attending: Interventional Cardiology | Admitting: Interventional Cardiology

## 2016-05-24 ENCOUNTER — Encounter (HOSPITAL_COMMUNITY): Payer: Self-pay | Admitting: *Deleted

## 2016-05-24 ENCOUNTER — Emergency Department (HOSPITAL_COMMUNITY): Payer: 59

## 2016-05-24 DIAGNOSIS — Z79899 Other long term (current) drug therapy: Secondary | ICD-10-CM | POA: Insufficient documentation

## 2016-05-24 DIAGNOSIS — Z89212 Acquired absence of left upper limb below elbow: Secondary | ICD-10-CM | POA: Insufficient documentation

## 2016-05-24 DIAGNOSIS — Z885 Allergy status to narcotic agent status: Secondary | ICD-10-CM | POA: Diagnosis not present

## 2016-05-24 DIAGNOSIS — R079 Chest pain, unspecified: Principal | ICD-10-CM | POA: Insufficient documentation

## 2016-05-24 DIAGNOSIS — F329 Major depressive disorder, single episode, unspecified: Secondary | ICD-10-CM | POA: Diagnosis not present

## 2016-05-24 DIAGNOSIS — Z955 Presence of coronary angioplasty implant and graft: Secondary | ICD-10-CM | POA: Diagnosis not present

## 2016-05-24 DIAGNOSIS — Z9889 Other specified postprocedural states: Secondary | ICD-10-CM | POA: Insufficient documentation

## 2016-05-24 DIAGNOSIS — G4733 Obstructive sleep apnea (adult) (pediatric): Secondary | ICD-10-CM | POA: Insufficient documentation

## 2016-05-24 DIAGNOSIS — I2510 Atherosclerotic heart disease of native coronary artery without angina pectoris: Secondary | ICD-10-CM | POA: Insufficient documentation

## 2016-05-24 DIAGNOSIS — K219 Gastro-esophageal reflux disease without esophagitis: Secondary | ICD-10-CM | POA: Diagnosis not present

## 2016-05-24 DIAGNOSIS — Z720 Tobacco use: Secondary | ICD-10-CM

## 2016-05-24 DIAGNOSIS — E782 Mixed hyperlipidemia: Secondary | ICD-10-CM | POA: Diagnosis not present

## 2016-05-24 DIAGNOSIS — E785 Hyperlipidemia, unspecified: Secondary | ICD-10-CM | POA: Diagnosis not present

## 2016-05-24 DIAGNOSIS — F17210 Nicotine dependence, cigarettes, uncomplicated: Secondary | ICD-10-CM | POA: Insufficient documentation

## 2016-05-24 DIAGNOSIS — Z7982 Long term (current) use of aspirin: Secondary | ICD-10-CM | POA: Insufficient documentation

## 2016-05-24 DIAGNOSIS — Z7951 Long term (current) use of inhaled steroids: Secondary | ICD-10-CM | POA: Diagnosis not present

## 2016-05-24 DIAGNOSIS — I119 Hypertensive heart disease without heart failure: Secondary | ICD-10-CM | POA: Diagnosis not present

## 2016-05-24 DIAGNOSIS — I25110 Atherosclerotic heart disease of native coronary artery with unstable angina pectoris: Secondary | ICD-10-CM

## 2016-05-24 DIAGNOSIS — I252 Old myocardial infarction: Secondary | ICD-10-CM | POA: Insufficient documentation

## 2016-05-24 LAB — CBC
HCT: 46.3 % (ref 39.0–52.0)
Hemoglobin: 16.2 g/dL (ref 13.0–17.0)
MCH: 31.7 pg (ref 26.0–34.0)
MCHC: 35 g/dL (ref 30.0–36.0)
MCV: 90.6 fL (ref 78.0–100.0)
PLATELETS: 204 10*3/uL (ref 150–400)
RBC: 5.11 MIL/uL (ref 4.22–5.81)
RDW: 12.4 % (ref 11.5–15.5)
WBC: 8.4 10*3/uL (ref 4.0–10.5)

## 2016-05-24 LAB — BASIC METABOLIC PANEL
ANION GAP: 10 (ref 5–15)
BUN: 10 mg/dL (ref 6–20)
CALCIUM: 9.1 mg/dL (ref 8.9–10.3)
CO2: 22 mmol/L (ref 22–32)
CREATININE: 1.13 mg/dL (ref 0.61–1.24)
Chloride: 107 mmol/L (ref 101–111)
Glucose, Bld: 92 mg/dL (ref 65–99)
Potassium: 3.9 mmol/L (ref 3.5–5.1)
Sodium: 139 mmol/L (ref 135–145)

## 2016-05-24 LAB — TROPONIN I
Troponin I: <0.03 ng/mL (ref <0.03)
Troponin I: <0.03 ng/mL (ref <0.03)
Troponin I: <0.03 ng/mL (ref <0.03)

## 2016-05-24 LAB — I-STAT TROPONIN, ED: TROPONIN I, POC: 0 ng/mL (ref 0.00–0.08)

## 2016-05-24 MED ORDER — ACETAMINOPHEN 325 MG PO TABS
650.0000 mg | ORAL_TABLET | Freq: Four times a day (QID) | ORAL | Status: DC | PRN
  Administered 2016-05-24 – 2016-05-25 (×2): 650 mg via ORAL
  Filled 2016-05-24: qty 2

## 2016-05-24 MED ORDER — SODIUM CHLORIDE 0.9 % WEIGHT BASED INFUSION
3.0000 mL/kg/h | INTRAVENOUS | Status: AC
  Administered 2016-05-25: 3 mL/kg/h via INTRAVENOUS

## 2016-05-24 MED ORDER — PANTOPRAZOLE SODIUM 40 MG PO TBEC
40.0000 mg | DELAYED_RELEASE_TABLET | Freq: Every day | ORAL | Status: DC
  Administered 2016-05-24 – 2016-05-25 (×2): 40 mg via ORAL
  Filled 2016-05-24 (×2): qty 1

## 2016-05-24 MED ORDER — ATORVASTATIN CALCIUM 80 MG PO TABS
80.0000 mg | ORAL_TABLET | Freq: Every day | ORAL | Status: DC
  Administered 2016-05-24 – 2016-05-25 (×2): 80 mg via ORAL
  Filled 2016-05-24 (×2): qty 1

## 2016-05-24 MED ORDER — ASPIRIN 81 MG PO CHEW
324.0000 mg | CHEWABLE_TABLET | Freq: Once | ORAL | Status: AC
  Administered 2016-05-24: 243 mg via ORAL
  Filled 2016-05-24: qty 4

## 2016-05-24 MED ORDER — NITROGLYCERIN 0.4 MG SL SUBL: 0.4000 mg | SUBLINGUAL_TABLET | SUBLINGUAL | Status: DC | PRN

## 2016-05-24 MED ORDER — SODIUM CHLORIDE 0.9 % WEIGHT BASED INFUSION
1.0000 mL/kg/h | INTRAVENOUS | Status: DC
  Administered 2016-05-25: 1 mL/kg/h via INTRAVENOUS

## 2016-05-24 MED ORDER — SODIUM CHLORIDE 0.9 % IV SOLN: 250.0000 mL | INTRAVENOUS | Status: DC | PRN

## 2016-05-24 MED ORDER — SODIUM CHLORIDE 0.9% FLUSH
3.0000 mL | Freq: Two times a day (BID) | INTRAVENOUS | Status: DC
  Administered 2016-05-24: 3 mL via INTRAVENOUS

## 2016-05-24 MED ORDER — ONDANSETRON HCL 4 MG/2ML IJ SOLN: 4.0000 mg | Freq: Four times a day (QID) | INTRAMUSCULAR | Status: DC | PRN

## 2016-05-24 MED ORDER — HEPARIN SODIUM (PORCINE) 5000 UNIT/ML IJ SOLN
5000.0000 [IU] | Freq: Three times a day (TID) | INTRAMUSCULAR | Status: DC
  Administered 2016-05-24 – 2016-05-25 (×3): 5000 [IU] via SUBCUTANEOUS
  Filled 2016-05-24 (×3): qty 1

## 2016-05-24 MED ORDER — ASPIRIN EC 81 MG PO TBEC
81.0000 mg | DELAYED_RELEASE_TABLET | Freq: Every day | ORAL | Status: DC
  Administered 2016-05-25: 81 mg via ORAL
  Filled 2016-05-24: qty 1

## 2016-05-24 MED ORDER — TICAGRELOR 60 MG PO TABS
60.0000 mg | ORAL_TABLET | Freq: Two times a day (BID) | ORAL | Status: DC
  Administered 2016-05-24 – 2016-05-25 (×2): 60 mg via ORAL
  Filled 2016-05-24 (×3): qty 1

## 2016-05-24 MED ORDER — GI COCKTAIL ~~LOC~~: 30.0000 mL | Freq: Four times a day (QID) | ORAL | Status: DC | PRN

## 2016-05-24 MED ORDER — SODIUM CHLORIDE 0.9% FLUSH: 3.0000 mL | INTRAVENOUS | Status: DC | PRN

## 2016-05-24 MED ORDER — NITROGLYCERIN 0.4 MG SL SUBL
0.4000 mg | SUBLINGUAL_TABLET | SUBLINGUAL | Status: DC | PRN
  Administered 2016-05-24 (×2): 0.4 mg via SUBLINGUAL
  Filled 2016-05-24: qty 1

## 2016-05-24 MED ORDER — ASPIRIN 81 MG PO CHEW
81.0000 mg | CHEWABLE_TABLET | ORAL | Status: AC
  Administered 2016-05-25: 81 mg via ORAL
  Filled 2016-05-24: qty 1

## 2016-05-24 NOTE — Progress Notes (Signed)
Patient arrived to 2W room 5.  Telemetry monitor applied and CCMD notified.  Patient oriented to unit and room to include call light and phone.  Will continue to monitor.

## 2016-05-24 NOTE — ED Notes (Signed)
Wife aware we are waiting one cards consult.

## 2016-05-24 NOTE — ED Notes (Signed)
Patient transported to X-ray 

## 2016-05-24 NOTE — ED Notes (Signed)
Attempted report x1. 

## 2016-05-24 NOTE — H&P (Signed)
[name] MRN: [id number], DOB/AGE: 11-12-1965   Admit date: 05/24/2016   Primary Physician: Mauricio Po, [place] Primary Cardiologist: Dr. Radford Pax  Pt. Profile:  51 y/o male with h/o HLD, tobacco use and CAD s/p NSTEMI in 10/2014 with LHC showing single vessel CAD with 80% proximal LAD that was treated with DES. Normal LCx and RCA and normal LVF. Also with h/o GERD, now presenting to the ED with complaint of chest pain.   Problem List  Past Medical History:  Diagnosis Date  . CAD (coronary artery disease)    a.  Non-STEMI 8/16: LHC-proximal LAD 80% treated with a resolute DES, EF 50-55%;  b.  Echo 8/16:  Moderate LVH, EF 55-60%, normal wall motion, normal diastolic function, normal RV function  . Depression    hx  . GERD (gastroesophageal reflux disease)   . Headache    "maybe monthly" (10/28/2014)  . HLD (hyperlipidemia)   . NSTEMI (non-ST elevated myocardial infarction) ([place]) 10/28/2014  . OSA (obstructive sleep apnea)    "tried mask; wear it off and on" (10/28/2014)    Past Surgical History:  Procedure Laterality Date  . 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 CARDIAC CATHETERIZATION N/A 10/29/2014   Procedure: Left Heart Cath and Coronary Angiography;  Surgeon: Troy Sine, MD;  Location: [facility];  Service: Cardiovascular;  Laterality: N/A;  . CHOLESTEATOMA EXCISION Right 1990's  . TYMPANOSTOMY TUBE PLACEMENT Bilateral "as a kid"   "had one done as an adult too"     Allergies  Allergies  Allergen Reactions  . Morphine And Related Itching    HPI  51 y/o male with h/o CAD, tobacco use and HLD s/p NSTEMI 10/2014. LHC showed single vessel CAD with 80% proximal LAD stenosis that was treated with a DES. He had a normal LCx and dominant RCA. EF was normal at that time at 50-55%. He was placed on DAPT with ASA + Brilinta (Twilight Trial), as well as high dose Lipitor. He also has a h/o GERD.  He presents to the [facility] ED with  complaint of chest pain. Similar to prior angina but not as severe. Symptoms first started 3 or 4 days ago and have been occurring off and on but have progressively worsened over the last day. Described as substernal chest pressure radiating up to his jaw as bilaterally with associated left arm discomfort. Symptoms occur at rest however not particularly worse with exertion. He has noticed that his discomfort is sometimes worsened after meals however he has had no improvement with antacids. He has had some mild associated dyspnea. Earlier today he had recurrent chest discomfort this time associated with nausea and fatigue prompting him to come to the ED. He was given sublingual nitroglycerin which helped to ease his discomfort however it did not completely resolve it. He is now with a headache. Unfortunately, he continues to smoke cigarettes of about 4-6 a day. For the most part, he is compliant with his medications but admits that he occasionally forgets a dose every now and then.  CBC, BMP and initial troponin unremarkable. EKG shows NSR w/o ischemic abnormalities. CXR w/o active cardiopulmonary disease.    Home Medications  Prior to Admission medications   Medication Sig Start Date End Date Taking? Authorizing Provider  aspirin 81 MG tablet Take 1 tablet (81 mg total) by mouth daily. 02/03/16  Yes Sueanne Margarita, MD  atorvastatin (LIPITOR) 80 MG tablet Take 1 tablet (80  mg total) by mouth daily at 6 PM. 03/24/16  Yes Sueanne Margarita, MD  fluticasone (FLONASE) 50 MCG/ACT nasal spray Place 2 sprays into both nostrils daily as needed for allergies or rhinitis.    Yes Historical Provider, MD  Multiple Vitamin (MULTI-VITAMIN PO) Take 1 tablet by mouth daily.   Yes Historical Provider, MD  nitroGLYCERIN (NITROSTAT) 0.4 MG SL tablet Place 1 tablet (0.4 mg total) under the tongue every 5 (five) minutes x 3 doses as needed for chest pain. 10/30/14  Yes Brett Canales, PA-C  nystatin ointment (MYCOSTATIN) Apply 1  application topically daily as needed (arm rash).   Yes Historical Provider, MD  pantoprazole (PROTONIX) 40 MG tablet Take 1 tablet (40 mg total) by mouth daily. 03/25/16  Yes Golden Circle, FNP  ticagrelor (BRILINTA) 60 MG TABS tablet Take 1 tablet (60 mg total) by mouth 2 (two) times daily. 03/24/16  Yes Sueanne Margarita, MD     Family History  Family History  Problem Relation Age of Onset  . Healthy Mother   . Healthy Father   . Multiple sclerosis Maternal Grandfather     Social History  Social History   Social History  . Marital status: Married    Spouse name: N/A  . Number of children: 2  . Years of education: 14   Occupational History  . CMA    Social History Main Topics  . Smoking status: Current Some Day Smoker    Packs/day: 0.50    Years: 32.00    Types: Cigarettes  . Smokeless tobacco: Never Used     Comment: 1 cigarette x week  . Alcohol use 9.6 - 10.2 oz/week    14 Cans of beer, 2 - 3 Standard drinks or equivalent per week     Comment: stopped 05/2015  . Drug use: Yes    Types: Marijuana     Comment: 10/28/2014 "stopped marijuana in the early 2000's"  . Sexual activity: Yes   Other Topics Concern  . Not on file   Social History Narrative   Born and raised in [place], [place].  Currently resides in a house with his wife and children. 1 dog. Fun: play computer games, sports.    Denies any religious beliefs effecting health care.    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, [address]     Review of Systems General:  No chills, fever, night sweats or weight changes.  Cardiovascular:  + chest pain, no dyspnea on exertion, edema, orthopnea, palpitations, paroxysmal nocturnal dyspnea. Dermatological: No rash, lesions/masses Respiratory: No cough, dyspnea Urologic: No hematuria, dysuria Abdominal:   No nausea, vomiting, diarrhea, bright red blood per rectum, melena, or hematemesis Neurologic:  No visual changes, wkns, changes in  mental status. All other systems reviewed and are otherwise negative except as noted above.  Physical Exam  Blood pressure 117/86, pulse 62, resp. rate 18, height 6' (1.829 m), weight 209 lb (94.8 kg), SpO2 96 %.  General: Pleasant, NAD Psych: Normal affect. Neuro: Alert and oriented X 3. Moves all extremities spontaneously. HEENT: Normal  Neck: Supple without bruits or JVD. Lungs:  Resp regular and unlabored, CTA. Heart: RRR no s3, s4, or murmurs. Abdomen: Soft, non-tender, non-distended, BS + x 4.  Extremities: No clubbing, cyanosis or edema. DP/PT/Radials 2+ and equal bilaterally.  Labs  Troponin [facility] Test)  Recent Labs  05/24/16 0816  TROPIPOC 0.00    Recent  Labs  05/24/16 1150  TROPONINI <0.03   Lab Results  Component Value Date   WBC 8.4 05/24/2016   HGB 16.2 05/24/2016   HCT 46.3 05/24/2016   MCV 90.6 05/24/2016   PLT 204 05/24/2016    Recent Labs Lab 05/24/16 0808  NA 139  K 3.9  CL 107  CO2 22  BUN 10  CREATININE 1.13  CALCIUM 9.1  GLUCOSE 92   Lab Results  Component Value Date   CHOL 119 (L) 03/13/2015   HDL 56 03/13/2015   LDLCALC 51 03/13/2015   TRIG 62 03/13/2015   No results found for: DDIMER   Radiology/Studies  Dg Chest 2 View  Result Date: 05/24/2016 CLINICAL DATA:  Left side chest pain, jaw pain, nausea, smoker EXAM: CHEST  2 VIEW COMPARISON:  10/28/2014 FINDINGS: Cardiomediastinal silhouette is stable. No infiltrate or pleural effusion. No pulmonary edema. Bony thorax is unremarkable. IMPRESSION: No active cardiopulmonary disease. Electronically Signed   By: Lahoma Crocker M.D.   On: 05/24/2016 08:29    ECG  NSR. No ischemia    ASSESSMENT AND PLAN  1. Chest Pain with Moderate Risk for Cardiac Etiolgy/CAD: known h/o CAD, s/p non-STEMI in August 2016 treated with drug-eluting stent to the proximal LAD. His left circumflex and right coronary arteries were both known to be without disease at that time. Left ventricular  ejection fraction was normal. He now presents with recurrent chest discomfort concerning for unstable angina. However some aspects of his discomfort are slightly atypical in that symptoms are not worsened by exertion and he has had some worsening pain after meals. There may be a GI component here. EKG shows normal sinus rhythm without any ischemic abnormalities. Initial troponin in the ED is negative. Given his history, I recommend admission for observation. We will continue to cycle cardiac enzymes 3. Keep NPO at midnight and plan for definitive LHC in the am. Continue aspirin, Brilinta and Lipitor. Smoking cessation was advised. Check FLP in the am to make sure LDL is at recommended goal of <70 mg/dL.   2. HLD: continue statin. Check FLP in the am. LDL goal, given known CAD is <70 mg/dL.   3. Tobacco Use: Current everyday smoker. Smoking cessation advised.   4. H/o GERD: Pt notes his discomfort has been somewhat worse after meals. Can give trial of GI cocktail + possibly add a PPI.    Signed, Lyda Jester, PA-C 05/24/2016, 2:20 PM   I have examined the patient and reviewed assessment and plan and discussed with patient.  Agree with above as stated.  Patient with negative enzymes but persistent chest discomfort.  Will plan on cath tomorrow.  All questions answered.  He had a prior LAD stent in 2016.  Larae Grooms

## 2016-05-24 NOTE — ED Notes (Signed)
C/o pressure across his chest and abd. States his head feels "thick"

## 2016-05-24 NOTE — ED Provider Notes (Signed)
[facility] DEPT Provider Note   CSN: [id number] Arrival date & time: 05/24/16  0800     History   Chief Complaint Chief Complaint  Patient presents with  . Chest Pain    HPI Isaac Patel is a 51 y.o. male.  The history is provided by the patient. No language interpreter was used.  Chest Pain   This is a new problem. The problem occurs constantly. The problem has been gradually worsening. The pain is associated with exertion. The pain is present in the lateral region. The pain is moderate. The pain does not radiate. Associated symptoms include diaphoresis, nausea and shortness of breath. He has tried nothing for the symptoms. The treatment provided mild relief.  His past medical history is significant for CAD and hyperlipidemia.  Pertinent negatives for past medical history include no diabetes.  Procedure history is positive for cardiac catheterization.    Past Medical History:  Diagnosis Date  . CAD (coronary artery disease)    a.  Non-STEMI 8/16: LHC-proximal LAD 80% treated with a resolute DES, EF 50-55%;  b.  Echo 8/16:  Moderate LVH, EF 55-60%, normal wall motion, normal diastolic function, normal RV function  . Depression    hx  . GERD (gastroesophageal reflux disease)   . Headache    "maybe monthly" (10/28/2014)  . HLD (hyperlipidemia)   . NSTEMI (non-ST elevated myocardial infarction) ([place]) 10/28/2014  . OSA (obstructive sleep apnea)    "tried mask; wear it off and on" (10/28/2014)    Patient Active Problem List   Diagnosis Date Noted  . 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 Anxiety state 11/15/2014  . CAD (coronary artery disease) 10/30/2014  . Tobacco abuse 10/30/2014  . HLD (hyperlipidemia) 10/30/2014  . History of non-ST elevation myocardial infarction (NSTEMI) 10/28/2014  . Routine general medical examination at a health care facility 05/29/2014  . OSA (obstructive sleep apnea) 05/08/2014  . 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 CARDIAC CATHETERIZATION N/A 10/29/2014   Procedure: Left Heart Cath and Coronary Angiography;  Surgeon: Troy Sine, MD;  Location: [facility];  Service: Cardiovascular;  Laterality: N/A;  . CHOLESTEATOMA EXCISION Right 1990's  . TYMPANOSTOMY TUBE PLACEMENT Bilateral "as a kid"   "had one done as an adult too"       Home Medications    Prior to Admission medications   Medication Sig Start Date End Date Taking? Authorizing Provider  aspirin 81 MG tablet Take 1 tablet (81 mg total) by mouth daily. 02/03/16  Yes Sueanne Margarita, MD  atorvastatin (LIPITOR) 80 MG tablet Take 1 tablet (80 mg total) by mouth daily at 6 PM. 03/24/16  Yes Traci R Turner, MD  fluticasone (FLONASE) 50 MCG/ACT nasal spray Place 2 sprays into both nostrils daily as needed for allergies or rhinitis.    Yes Historical Provider, MD  Multiple Vitamin (MULTI-VITAMIN PO) Take 1 tablet by mouth daily.   Yes Historical Provider, MD  nitroGLYCERIN (NITROSTAT) 0.4 MG SL tablet Place 1 tablet (0.4 mg total) under the tongue every 5 (five) minutes x 3 doses as needed for chest pain. 10/30/14  Yes Brett Canales, PA-C  nystatin ointment (MYCOSTATIN) Apply 1 application topically daily as needed (arm rash).   Yes Historical Provider, MD  pantoprazole (PROTONIX) 40 MG tablet Take 1 tablet (40 mg total) by mouth daily. 03/25/16  Yes Golden Circle, FNP  ticagrelor (BRILINTA) 60 MG TABS tablet Take 1 tablet (60 mg total) by mouth 2 (two) times daily. 03/24/16  Yes Sueanne Margarita, MD    Family History Family History  Problem Relation Age of Onset  . Healthy Mother   . Healthy Father   . Multiple sclerosis Maternal Grandfather     Social History Social History  Substance Use Topics  . Smoking status: Current Some Day Smoker    Packs/day: 0.50    Years: 32.00    Types: Cigarettes  . Smokeless tobacco: Never  Used     Comment: 1 cigarette x week  . Alcohol use 9.6 - 10.2 oz/week    14 Cans of beer, 2 - 3 Standard drinks or equivalent per week     Comment: stopped 05/2015     Allergies   Morphine and related   Review of Systems Review of Systems  Constitutional: Positive for diaphoresis.  Respiratory: Positive for shortness of breath.   Cardiovascular: Positive for chest pain.  Gastrointestinal: Positive for nausea.  All other systems reviewed and are negative.    Physical Exam Updated Vital Signs BP 90/63 (BP Location: Right Arm)   Pulse 76   Resp 18   Ht 6' (1.829 m)   Wt 94.8 kg   SpO2 97%   BMI 28.35 kg/m   Physical Exam  Constitutional: He appears well-developed and well-nourished.  HENT:  Head: Normocephalic and atraumatic.  Right Ear: External ear normal.  Left Ear: External ear normal.  Mouth/Throat: Oropharynx is clear and moist.  Eyes: Conjunctivae are normal.  Neck: Neck supple.  Cardiovascular: Normal rate and regular rhythm.   No murmur heard. Pulmonary/Chest: Effort normal and breath sounds normal. No respiratory distress.  Abdominal: Soft. There is no tenderness.  Musculoskeletal: He exhibits no edema.  Neurological: He is alert.  Skin: Skin is warm and dry.  Psychiatric: He has a normal mood and affect.  Nursing note and vitals reviewed.    ED Treatments / Results  Labs (all labs ordered are listed, but only abnormal results are displayed) Labs Reviewed  BASIC METABOLIC PANEL  CBC  TROPONIN I  I-STAT TROPOININ, ED    EKG  EKG Interpretation  Date/Time:  Monday May 24 2016 08:05:31 EST Ventricular Rate:  62 PR Interval:    QRS Duration: 102 QT Interval:  411 QTC Calculation: 418 R Axis:   76 Text Interpretation:  Sinus rhythm Similar to prior. No STEMI.  Confirmed by LONG MD, JOSHUA [phone]) on 05/24/2016 8:13:55 AM       Radiology Dg Chest 2 View  Result Date: 05/24/2016 CLINICAL DATA:  Left side chest pain, jaw pain,  nausea, smoker EXAM: CHEST  2 VIEW COMPARISON:  10/28/2014 FINDINGS: Cardiomediastinal silhouette is stable. No infiltrate or pleural effusion. No pulmonary edema. Bony thorax is unremarkable. IMPRESSION: No active cardiopulmonary disease. Electronically Signed   By: Lahoma Crocker M.D.   On: 05/24/2016 08:29    Procedures Procedures (including critical care time)  Medications Ordered in ED Medications  nitroGLYCERIN (NITROSTAT) SL tablet 0.4 mg (0.4 mg Sublingual Given 05/24/16 0928)  aspirin chewable tablet 324 mg (243 mg Oral Given 05/24/16 0917)     Initial Impression / Assessment and Plan / ED Course  I have reviewed the triage vital signs and the nursing notes.  Pertinent labs & imaging results that were available during my care of the patient were reviewed by me and considered in my medical decision making (  see chart for details).     Pt given asa and nitro.   Troponin negative x 2.  No acute ekg changes.    I spoke to cardiology who will see pt here.  Final Clinical Impressions(s) / ED Diagnoses   Final diagnoses:  Chest pain, unspecified type    New Prescriptions New Prescriptions   No medications on file     Fransico Meadow, PA-C 05/24/16 Charlotte Hall, MD 05/24/16 [phone]

## 2016-05-24 NOTE — ED Triage Notes (Addendum)
Pt states L chest and jaw pressure and nausea for several days that increased this am.  Pt took 1 81  asa before coming.

## 2016-05-25 ENCOUNTER — Encounter (HOSPITAL_COMMUNITY): Payer: Self-pay | Admitting: Cardiovascular Disease

## 2016-05-25 ENCOUNTER — Encounter (HOSPITAL_COMMUNITY)
Admission: EM | Disposition: A | Discharge status: Home / Self Care | Payer: Self-pay | Source: Home / Self Care | Attending: Emergency Medicine

## 2016-05-25 DIAGNOSIS — R079 Chest pain, unspecified: Secondary | ICD-10-CM | POA: Diagnosis not present

## 2016-05-25 DIAGNOSIS — Z955 Presence of coronary angioplasty implant and graft: Secondary | ICD-10-CM | POA: Diagnosis not present

## 2016-05-25 DIAGNOSIS — I2510 Atherosclerotic heart disease of native coronary artery without angina pectoris: Secondary | ICD-10-CM | POA: Diagnosis not present

## 2016-05-25 DIAGNOSIS — I252 Old myocardial infarction: Secondary | ICD-10-CM | POA: Diagnosis not present

## 2016-05-25 DIAGNOSIS — E785 Hyperlipidemia, unspecified: Secondary | ICD-10-CM | POA: Diagnosis not present

## 2016-05-25 DIAGNOSIS — F329 Major depressive disorder, single episode, unspecified: Secondary | ICD-10-CM | POA: Diagnosis not present

## 2016-05-25 DIAGNOSIS — F17210 Nicotine dependence, cigarettes, uncomplicated: Secondary | ICD-10-CM | POA: Diagnosis not present

## 2016-05-25 DIAGNOSIS — I119 Hypertensive heart disease without heart failure: Secondary | ICD-10-CM | POA: Diagnosis not present

## 2016-05-25 DIAGNOSIS — K219 Gastro-esophageal reflux disease without esophagitis: Secondary | ICD-10-CM | POA: Diagnosis not present

## 2016-05-25 HISTORY — PX: LEFT HEART CATH AND CORONARY ANGIOGRAPHY: CATH118249

## 2016-05-25 LAB — CBC
HCT: 42 % (ref 39.0–52.0)
HEMOGLOBIN: 14.7 g/dL (ref 13.0–17.0)
MCH: 32.3 pg (ref 26.0–34.0)
MCHC: 35 g/dL (ref 30.0–36.0)
MCV: 92.3 fL (ref 78.0–100.0)
Platelets: 186 10*3/uL (ref 150–400)
RBC: 4.55 MIL/uL (ref 4.22–5.81)
RDW: 12.7 % (ref 11.5–15.5)
WBC: 6.7 10*3/uL (ref 4.0–10.5)

## 2016-05-25 LAB — BASIC METABOLIC PANEL
Anion gap: 11 (ref 5–15)
BUN: 16 mg/dL (ref 6–20)
CHLORIDE: 109 mmol/L (ref 101–111)
CO2: 18 mmol/L — ABNORMAL LOW (ref 22–32)
Calcium: 8.2 mg/dL — ABNORMAL LOW (ref 8.9–10.3)
Creatinine, Ser: 0.99 mg/dL (ref 0.61–1.24)
GFR calc Af Amer: >60 mL/min (ref >60)
GFR calc non Af Amer: >60 mL/min (ref >60)
Glucose, Bld: 106 mg/dL — ABNORMAL HIGH (ref 65–99)
POTASSIUM: 4.4 mmol/L (ref 3.5–5.1)
SODIUM: 138 mmol/L (ref 135–145)

## 2016-05-25 LAB — TROPONIN I: Troponin I: <0.03 ng/mL (ref <0.03)

## 2016-05-25 LAB — PROTIME-INR
INR: 1.05
Prothrombin Time: 13.7 s (ref 11.4–15.2)

## 2016-05-25 SURGERY — LEFT HEART CATH AND CORONARY ANGIOGRAPHY

## 2016-05-25 MED ORDER — LISINOPRIL 10 MG PO TABS: 10.0000 mg | ORAL_TABLET | Freq: Every day | ORAL | 6 refills | Status: DC

## 2016-05-25 MED ORDER — HEPARIN (PORCINE) IN NACL 2-0.9 UNIT/ML-% IJ SOLN
INTRAMUSCULAR | Status: DC | PRN
  Administered 2016-05-25: 1000 mL

## 2016-05-25 MED ORDER — FENTANYL CITRATE (PF) 100 MCG/2ML IJ SOLN
INTRAMUSCULAR | Status: DC | PRN
  Administered 2016-05-25: 25 ug via INTRAVENOUS

## 2016-05-25 MED ORDER — LIDOCAINE HCL (PF) 1 % IJ SOLN
INTRAMUSCULAR | Status: DC | PRN
  Administered 2016-05-25: 15 mL

## 2016-05-25 MED ORDER — SODIUM CHLORIDE 0.9 % WEIGHT BASED INFUSION: 1.0000 mL/kg/h | INTRAVENOUS | Status: DC

## 2016-05-25 MED ORDER — SODIUM CHLORIDE 0.9 % IV SOLN
250.0000 mL | INTRAVENOUS | Status: DC | PRN
Start: 2016-05-25 — End: 2016-05-25

## 2016-05-25 MED ORDER — SODIUM CHLORIDE 0.9% FLUSH: 3.0000 mL | INTRAVENOUS | Status: DC | PRN

## 2016-05-25 MED ORDER — IOPAMIDOL (ISOVUE-370) INJECTION 76%
INTRAVENOUS | Status: DC | PRN
  Administered 2016-05-25: 70 mL via INTRA_ARTERIAL

## 2016-05-25 MED ORDER — ACETAMINOPHEN 325 MG PO TABS
ORAL_TABLET | ORAL | Status: AC
  Filled 2016-05-25: qty 2

## 2016-05-25 MED ORDER — HEPARIN (PORCINE) IN NACL 2-0.9 UNIT/ML-% IJ SOLN
INTRAMUSCULAR | Status: AC
  Filled 2016-05-25: qty 1000

## 2016-05-25 MED ORDER — LISINOPRIL 10 MG PO TABS
10.0000 mg | ORAL_TABLET | Freq: Every day | ORAL | Status: DC
  Administered 2016-05-25: 10 mg via ORAL
  Filled 2016-05-25: qty 1

## 2016-05-25 MED ORDER — SODIUM CHLORIDE 0.9% FLUSH: 3.0000 mL | Freq: Two times a day (BID) | INTRAVENOUS | Status: DC

## 2016-05-25 MED ORDER — MIDAZOLAM HCL 2 MG/2ML IJ SOLN
INTRAMUSCULAR | Status: AC
  Filled 2016-05-25: qty 2

## 2016-05-25 MED ORDER — FENTANYL CITRATE (PF) 100 MCG/2ML IJ SOLN
INTRAMUSCULAR | Status: AC
  Filled 2016-05-25: qty 2

## 2016-05-25 MED ORDER — LIDOCAINE HCL (PF) 1 % IJ SOLN
INTRAMUSCULAR | Status: AC
  Filled 2016-05-25: qty 30

## 2016-05-25 MED ORDER — IOPAMIDOL (ISOVUE-370) INJECTION 76%
INTRAVENOUS | Status: AC
  Filled 2016-05-25: qty 100

## 2016-05-25 MED ORDER — MIDAZOLAM HCL 2 MG/2ML IJ SOLN
INTRAMUSCULAR | Status: DC | PRN
  Administered 2016-05-25: 2 mg via INTRAVENOUS

## 2016-05-25 SURGICAL SUPPLY — 9 items
CATH INFINITI 5FR MULTPACK ANG (CATHETERS) ×3 IMPLANT
COVER PRB 48X5XTLSCP FOLD TPE (BAG) ×1 IMPLANT
COVER PROBE 5X48 (BAG) ×2
KIT HEART LEFT (KITS) ×3 IMPLANT
PACK CARDIAC CATHETERIZATION (CUSTOM PROCEDURE TRAY) ×3 IMPLANT
SHEATH PINNACLE 5F 10CM (SHEATH) ×3 IMPLANT
SYR MEDRAD MARK V 150ML (SYRINGE) ×3 IMPLANT
TRANSDUCER W/STOPCOCK (MISCELLANEOUS) ×3 IMPLANT
WIRE EMERALD 3MM-J .035X150CM (WIRE) ×3 IMPLANT

## 2016-05-25 NOTE — Progress Notes (Signed)
Progress Note  Patient Name: Isaac Patel Date of Encounter: 05/25/2016  Primary Cardiologist: Dr. Radford Pax  Subjective   Continues to have intermittent chest pain  Inpatient Medications    Scheduled Meds: . aspirin EC  81 mg Oral Daily  . atorvastatin  80 mg Oral q1800  . heparin  5,000 Units Subcutaneous Q8H  . pantoprazole  40 mg Oral Daily  . sodium chloride flush  3 mL Intravenous Q12H  . ticagrelor  60 mg Oral BID   Continuous Infusions: . sodium chloride 1 mL/kg/hr (05/25/16 0650)   PRN Meds: sodium chloride, acetaminophen, gi cocktail, nitroGLYCERIN, nitroGLYCERIN, ondansetron (ZOFRAN) IV, sodium chloride flush   Vital Signs    Vitals:   05/24/16 1615 05/24/16 1723 05/24/16 2152 05/25/16 0454  BP: 131/87 122/88 121/68 127/82  Pulse: (!) 58 62 (!) 59 67  Resp: 13 16 18 18   Temp:  97.9 F (36.6 C) 97.7 F (36.5 C) 97.8 F (36.6 C)  TempSrc:  Oral Oral Oral  SpO2: 95% 97% 98% 97%  Weight:  203 lb 1.6 oz (92.1 kg)    Height:  6' (1.829 m)      Intake/Output Summary (Last 24 hours) at 05/25/16 1019 Last data filed at 05/25/16 0646  Gross per 24 hour  Intake              624 ml  Output                0 ml  Net              624 ml   Filed Weights   05/24/16 0805 05/24/16 1723  Weight: 209 lb (94.8 kg) 203 lb 1.6 oz (92.1 kg)    Telemetry    NSR - Personally Reviewed  ECG    NSR, no ST changes - Personally Reviewed  Physical Exam   GEN: No acute distress.   Neck: No JVD Cardiac: RRR, no murmurs, rubs, or gallops.  Respiratory: Clear to auscultation bilaterally. 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; left arm amputation Neuro:  Nonfocal  Psych: Normal affect   Labs    Chemistry Recent Labs Lab 05/24/16 0808 05/25/16 0216  NA 139 138  K 3.9 4.4  CL 107 109  CO2 22 18*  GLUCOSE 92 106*  BUN 10 16  CREATININE 1.13 0.99  CALCIUM 9.1 8.2*  GFRNONAA >60 >60  GFRAA >60 >60  ANIONGAP 10 11     Hematology Recent  Labs Lab 05/24/16 0808 05/25/16 0216  WBC 8.4 6.7  RBC 5.11 4.55  HGB 16.2 14.7  HCT 46.3 42.0  MCV 90.6 92.3  MCH 31.7 32.3  MCHC 35.0 35.0  RDW 12.4 12.7  PLT 204 186    Cardiac Enzymes Recent Labs Lab 05/24/16 1150 05/24/16 1759 05/24/16 2019 05/24/16 2306  TROPONINI <0.03 <0.03 <0.03 <0.03    Recent Labs Lab 05/24/16 0816  TROPIPOC 0.00     BNPNo results for input(s): BNP, PROBNP in the last 168 hours.   DDimer No results for input(s): DDIMER in the last 168 hours.   Radiology    Dg Chest 2 View  Result Date: 05/24/2016 CLINICAL DATA:  Left side chest pain, jaw pain, nausea, smoker EXAM: CHEST  2 VIEW COMPARISON:  10/28/2014 FINDINGS: Cardiomediastinal silhouette is stable. No infiltrate or pleural effusion. No pulmonary edema. Bony thorax is unremarkable. IMPRESSION: No active cardiopulmonary disease. Electronically Signed   By: Lahoma Crocker M.D.   On: 05/24/2016 08:29  Cardiac Studies   Enzymes negative  Patient Profile     51 y.o. male with known CAD  Assessment & Plan    Plan for cath today.  He is considering signing up for the sirolimus stent trial.  We discussed the trial.  No evidence of acute ischeia based on the enzymes.  COntinue aggressive secondary prevention.   Signed, Larae Grooms, MD  05/25/2016, 10:19 AM

## 2016-05-25 NOTE — Interval H&P Note (Signed)
History and Physical Interval Note:  05/25/2016 1:14 PM  Isaac Patel  has presented today for surgery, with the diagnosis of unstable angina  The various methods of treatment have been discussed with the patient and family. After consideration of risks, benefits and other options for treatment, the patient has consented to  Procedure(s): Left Heart Cath and Coronary Angiography (N/A) as a surgical intervention .  The patient's history has been reviewed, patient examined, no change in status, stable for surgery.  I have reviewed the patient's chart and labs.  Questions were answered to the patient's satisfaction.     Sherren Mocha

## 2016-05-25 NOTE — Research (Signed)
OPTIMIZE Informed Consent   Subject Name: Brayant Dorr  Subject met inclusion and exclusion criteria.  The informed consent form, study requirements and expectations were reviewed with the subject and questions and concerns were addressed prior to the signing of the consent form.  The subject verbalized understanding of the trail requirements.  The subject agreed to participate in the OPTIMIZE trial and signed the informed consent.  The informed consent was obtained prior to performance of any protocol-specific procedures for the subject.  A copy of the signed informed consent was given to the subject and a copy was placed in the subject's medical record.  Hedrick,Tammy W 05/25/2016, 9:46 AM

## 2016-05-25 NOTE — Progress Notes (Signed)
Patient and family given discharge instructions, medication list and patient personal prescription sent to personal pharmacy. All questions answered. Patient rt groin without hematoma or bleeding after bed rest. Pt given work note IV and tele dcd will discharge home as ordered .Lennix Kneisel, Bettina Gavia RN

## 2016-05-25 NOTE — Progress Notes (Addendum)
Site area: Right groin a 5 french arterial sheath was removed  Site Prior to Removal:  Level 0  Pressure Applied For 20 MINUTES    Bedrest Beginning at 1430p  Manual:   Yes.    Patient Status During Pull:  Stable  Post Pull Groin Site:  Level 0  Post Pull Instructions Given:  Yes.    Post Pull Pulses Present:  Yes.    Dressing Applied:  Yes.    Comments:  VS remain stable during sheath pull

## 2016-05-25 NOTE — H&P (View-Only) (Signed)
Progress Note  Patient Name: Isaac Patel Date of Encounter: 05/25/2016  Primary Cardiologist: Dr. Radford Pax  Subjective   Continues to have intermittent chest pain  Inpatient Medications    Scheduled Meds: . aspirin EC  81 mg Oral Daily  . atorvastatin  80 mg Oral q1800  . heparin  5,000 Units Subcutaneous Q8H  . pantoprazole  40 mg Oral Daily  . sodium chloride flush  3 mL Intravenous Q12H  . ticagrelor  60 mg Oral BID   Continuous Infusions: . sodium chloride 1 mL/kg/hr (05/25/16 0650)   PRN Meds: sodium chloride, acetaminophen, gi cocktail, nitroGLYCERIN, nitroGLYCERIN, ondansetron (ZOFRAN) IV, sodium chloride flush   Vital Signs    Vitals:   05/24/16 1615 05/24/16 1723 05/24/16 2152 05/25/16 0454  BP: 131/87 122/88 121/68 127/82  Pulse: (!) 58 62 (!) 59 67  Resp: 13 16 18 18   Temp:  97.9 F (36.6 C) 97.7 F (36.5 C) 97.8 F (36.6 C)  TempSrc:  Oral Oral Oral  SpO2: 95% 97% 98% 97%  Weight:  203 lb 1.6 oz (92.1 kg)    Height:  6' (1.829 m)      Intake/Output Summary (Last 24 hours) at 05/25/16 1019 Last data filed at 05/25/16 0646  Gross per 24 hour  Intake              624 ml  Output                0 ml  Net              624 ml   Filed Weights   05/24/16 0805 05/24/16 1723  Weight: 209 lb (94.8 kg) 203 lb 1.6 oz (92.1 kg)    Telemetry    NSR - Personally Reviewed  ECG    NSR, no ST changes - Personally Reviewed  Physical Exam   GEN: No acute distress.   Neck: No JVD Cardiac: RRR, no murmurs, rubs, or gallops.  Respiratory: Clear to auscultation bilaterally. 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; left arm amputation Neuro:  Nonfocal  Psych: Normal affect   Labs    Chemistry Recent Labs Lab 05/24/16 0808 05/25/16 0216  NA 139 138  K 3.9 4.4  CL 107 109  CO2 22 18*  GLUCOSE 92 106*  BUN 10 16  CREATININE 1.13 0.99  CALCIUM 9.1 8.2*  GFRNONAA >60 >60  GFRAA >60 >60  ANIONGAP 10 11     Hematology Recent  Labs Lab 05/24/16 0808 05/25/16 0216  WBC 8.4 6.7  RBC 5.11 4.55  HGB 16.2 14.7  HCT 46.3 42.0  MCV 90.6 92.3  MCH 31.7 32.3  MCHC 35.0 35.0  RDW 12.4 12.7  PLT 204 186    Cardiac Enzymes Recent Labs Lab 05/24/16 1150 05/24/16 1759 05/24/16 2019 05/24/16 2306  TROPONINI <0.03 <0.03 <0.03 <0.03    Recent Labs Lab 05/24/16 0816  TROPIPOC 0.00     BNPNo results for input(s): BNP, PROBNP in the last 168 hours.   DDimer No results for input(s): DDIMER in the last 168 hours.   Radiology    Dg Chest 2 View  Result Date: 05/24/2016 CLINICAL DATA:  Left side chest pain, jaw pain, nausea, smoker EXAM: CHEST  2 VIEW COMPARISON:  10/28/2014 FINDINGS: Cardiomediastinal silhouette is stable. No infiltrate or pleural effusion. No pulmonary edema. Bony thorax is unremarkable. IMPRESSION: No active cardiopulmonary disease. Electronically Signed   By: Lahoma Crocker M.D.   On: 05/24/2016 08:29  Cardiac Studies   Enzymes negative  Patient Profile     51 y.o. male with known CAD  Assessment & Plan    Plan for cath today.  He is considering signing up for the sirolimus stent trial.  We discussed the trial.  No evidence of acute ischeia based on the enzymes.  COntinue aggressive secondary prevention.   Signed, Larae Grooms, MD  05/25/2016, 10:19 AM

## 2016-05-25 NOTE — Progress Notes (Signed)
Received patient  back from the cath lab.  Alert & oriented, Rt groin dressing clean dry & intact,  RLE  Good pulse present. B/P  134/93  Temp 97.9  HR   70  NSR  R 20 Explained to patient Bedrest x 3  Hours.   Pt given liquids to drink.   Mervyn Skeeters, RN

## 2016-05-25 NOTE — Discharge Summary (Addendum)
Discharge Summary    [name],  MRN: [id number], DOB/AGE: 52-Mar-1967 52 y.o.  Admit date: 05/24/2016 Discharge date: 05/25/2016  Primary Care Provider: Mauricio Po Primary Cardiologist: Dr. Radford Pax  Discharge Diagnoses    Active Problems:   Chest pain with moderate risk for cardiac etiology  Allergies Allergies  Allergen Reactions  . Morphine And Related Itching    Diagnostic Studies/Procedures    LHC: 05/25/16   1. Patent proximal LAD stent 2. Minor nonobstructive CAD 3. Normal LV systolic function with LVEF estimated at 55%  _____________   History of Present Illness     51 y/o male with h/o CAD, tobacco use and HLD s/p NSTEMI 10/2014. LHC showed single vessel CAD with 80% proximal LAD stenosis that was treated with a DES. He had a normal LCx and dominant RCA. EF was normal at that time at 50-55%. He was placed on DAPT with ASA + Brilinta (Twilight Trial), as well as high dose Lipitor. He also has a h/o GERD.  He presents to the [facility] ED with complaint of chest pain. Similar to prior angina but not as severe. Symptoms first started 3 or 4 days prior to admission and have been occurring off and on but have progressively worsened over the last day. Described as substernal chest pressure radiating up to his jaw as bilaterally with associated left arm discomfort. Symptoms occur at rest however not particularly worse with exertion. He has noticed that his discomfort is sometimes worsened after meals however he has had no improvement with antacids. He has had some mild associated dyspnea. On the day of admission he had recurrent chest discomfort this time associated with nausea and fatigue prompting him to come to the ED. He was given sublingual nitroglycerin which helped to ease his discomfort however it did not completely resolve it. He then developed a headache. Unfortunately, he continues to smoke cigarettes of about 4-6 a day. For the most part, he is  compliant with his medications but admits that he occasionally forgets a dose every now and then.  CBC, BMP and initial troponin unremarkable. EKG shows NSR w/o ischemic abnormalities. CXR w/o active cardiopulmonary disease.    Hospital Course     Consultants: None   He was admitted with plans for cardiac catheterization given his persistent chest discomfort. He ruled out with enzymes. EKG remained stable. No further episodes of chest pain. He underwent LHC with Dr. Burt Knack showing stable CAD, and patent LAD stent. Was noted to be hypertensive during this admission. Lisinopril 10mg  daily was added.   He was seen and assessed by Dr. Irish Lack and determined stable for discharge home. He will plan to follow up with his PCP in the outpatient setting, with a BMET in 2 weeks.   _____________  Discharge Vitals Blood pressure (!) 131/94, pulse 72, temperature 97.9 F (36.6 C), temperature source Oral, resp. rate 20, height 6' (1.829 m), weight 203 lb 1.6 oz (92.1 kg), SpO2 96 %.  Filed Weights   05/24/16 0805 05/24/16 1723  Weight: 209 lb (94.8 kg) 203 lb 1.6 oz (92.1 kg)    Labs & Radiologic Studies    CBC  Recent Labs  05/24/16 0808 05/25/16 0216  WBC 8.4 6.7  HGB 16.2 14.7  HCT 46.3 42.0  MCV 90.6 92.3  PLT 204 [id number]   Basic Metabolic Panel  Recent Labs  05/24/16 0808 05/25/16 0216  NA 139 138  K 3.9 4.4  CL 107 109  CO2 22  18*  GLUCOSE 92 106*  BUN 10 16  CREATININE 1.13 0.99  CALCIUM 9.1 8.2*   Liver Function Tests No results for input(s): AST, ALT, ALKPHOS, BILITOT, PROT, ALBUMIN in the last 72 hours. No results for input(s): LIPASE, AMYLASE in the last 72 hours. Cardiac Enzymes  Recent Labs  05/24/16 1759 05/24/16 2019 05/24/16 2306  TROPONINI <0.03 <0.03 <0.03   BNP Invalid input(s): POCBNP D-Dimer No results for input(s): DDIMER in the last 72 hours. Hemoglobin A1C No results for input(s): HGBA1C in the last 72 hours. Fasting Lipid Panel No  results for input(s): CHOL, HDL, LDLCALC, TRIG, CHOLHDL, LDLDIRECT in the last 72 hours. Thyroid Function Tests No results for input(s): TSH, T4TOTAL, T3FREE, THYROIDAB in the last 72 hours.  Invalid input(s): FREET3 _____________  Dg Chest 2 View  Result Date: 05/24/2016 CLINICAL DATA:  Left side chest pain, jaw pain, nausea, smoker EXAM: CHEST  2 VIEW COMPARISON:  10/28/2014 FINDINGS: Cardiomediastinal silhouette is stable. No infiltrate or pleural effusion. No pulmonary edema. Bony thorax is unremarkable. IMPRESSION: No active cardiopulmonary disease. Electronically Signed   By: Lahoma Crocker M.D.   On: 05/24/2016 08:29   Disposition   Pt is being discharged home today in good condition.  Follow-up Plans & Appointments    Follow-up Information    Mauricio Po, FNP Follow up.   Specialty:  Family Medicine Why:  Please arrange for a follow up in 2 weeks.  Contact information: [place] [address] [phone]          Discharge Instructions    Call MD for:  redness, tenderness, or signs of infection (pain, swelling, redness, odor or green/yellow discharge around incision site)    Complete by:  As directed    Diet - low sodium heart healthy    Complete by:  As directed    Discharge instructions    Complete by:  As directed    Groin Site Care Refer to this sheet in the next few weeks. These instructions provide you with information on caring for yourself after your procedure. Your caregiver may also give you more specific instructions. Your treatment has been planned according to current medical practices, but problems sometimes occur. Call your caregiver if you have any problems or questions after your procedure. HOME CARE INSTRUCTIONS You may shower 24 hours after the procedure. Remove the bandage (dressing) and gently wash the site with plain soap and water. Gently pat the site dry.  Do not apply powder or lotion to the site.  Do not sit in a bathtub,  swimming pool, or whirlpool for 5 to 7 days.  No bending, squatting, or lifting anything over 10 pounds (4.5 kg) for 2 weeks Inspect the site at least twice daily.  Do not drive home if you are discharged the same day of the procedure. Have someone else drive you.  You may drive 24 hours after the procedure unless otherwise instructed by your caregiver.  What to expect: Any bruising will usually fade within 1 to 2 weeks.  Blood that collects in the tissue (hematoma) may be painful to the touch. It should usually decrease in size and tenderness within 1 to 2 weeks.  SEEK IMMEDIATE MEDICAL CARE IF: You have unusual pain at the groin site or down the affected leg.  You have redness, warmth, swelling, or pain at the groin site.  You have drainage (other than a small amount of blood on the dressing).  You have chills.  You  have a fever or persistent symptoms for more than 72 hours.  You have a fever and your symptoms suddenly get worse.  Your leg becomes pale, cool, tingly, or numb.  You have heavy bleeding from the site. Hold pressure on the site. .  We have added Lisinopril for your blood pressure this admission. Please have your blood work checked within 2 weeks at your PCP office.   Increase activity slowly    Complete by:  As directed       Discharge Medications   Current Discharge Medication List    START taking these medications   Details  lisinopril (PRINIVIL,ZESTRIL) 10 MG tablet Take 1 tablet (10 mg total) by mouth daily. Qty: 30 tablet, Refills: 6      CONTINUE these medications which have NOT CHANGED   Details  aspirin 81 MG tablet Take 1 tablet (81 mg total) by mouth daily. Qty: 30 tablet    atorvastatin (LIPITOR) 80 MG tablet Take 1 tablet (80 mg total) by mouth daily at 6 PM. Qty: 90 tablet, Refills: 3    fluticasone (FLONASE) 50 MCG/ACT nasal spray Place 2 sprays into both nostrils daily as needed for allergies or rhinitis.     Multiple Vitamin (MULTI-VITAMIN  PO) Take 1 tablet by mouth daily.    nitroGLYCERIN (NITROSTAT) 0.4 MG SL tablet Place 1 tablet (0.4 mg total) under the tongue every 5 (five) minutes x 3 doses as needed for chest pain. Qty: 25 tablet, Refills: 12    nystatin ointment (MYCOSTATIN) Apply 1 application topically daily as needed (arm rash).    pantoprazole (PROTONIX) 40 MG tablet Take 1 tablet (40 mg total) by mouth daily. Qty: 90 tablet, Refills: 1    ticagrelor (BRILINTA) 60 MG TABS tablet Take 1 tablet (60 mg total) by mouth 2 (two) times daily. Qty: 180 tablet, Refills: 3         Aspirin prescribed at discharge?  Yes High Intensity Statin Prescribed? (Lipitor 40-80mg  or Crestor 20-40mg ): Yes Beta Blocker Prescribed? No: Bradycardic For EF <40%, was ACEI/ARB Prescribed? Yes ADP Receptor Inhibitor Prescribed? (i.e. Plavix etc.-Includes Medically Managed Patients): Yes For EF <40%, Aldosterone Inhibitor Prescribed? No: EF ok Was EF assessed during THIS hospitalization? Yes Was Cardiac Rehab II ordered? (Included Medically managed Patients): No:    Outstanding Labs/Studies   BMET at PCP office.   Duration of Discharge Encounter   Greater than 30 minutes including physician time.  Signed, Reino Bellis NP-C 05/25/2016, 5:11 PM   I have examined the patient and reviewed assessment and plan and discussed with patient.  Agree with above as stated.  Cath without significant disease.  COntinue aggressive secondary prevention.  Right groin site stable.  Discharge later today.  Return to work next Monday to allow his groin time to heal.  He is in agreement with this plan.  Will also start lisinopril 10 mg daily for BP for hypertensive heart disease.  Known hyperlipidemia. He needs to avoid tobacco.  THis will need to be titrated as an outpatient.    Larae Grooms

## 2016-05-27 DIAGNOSIS — H5203 Hypermetropia, bilateral: Secondary | ICD-10-CM | POA: Diagnosis not present

## 2016-06-08 ENCOUNTER — Encounter: Payer: Self-pay | Admitting: Family

## 2016-06-08 ENCOUNTER — Ambulatory Visit (INDEPENDENT_AMBULATORY_CARE_PROVIDER_SITE_OTHER): Payer: 59 | Admitting: Family

## 2016-06-08 VITALS — BP 122/80 | HR 89 | Temp 98.4°F | Resp 16 | Ht 72.0 in | Wt 213.0 lb

## 2016-06-08 DIAGNOSIS — I10 Essential (primary) hypertension: Secondary | ICD-10-CM | POA: Diagnosis not present

## 2016-06-08 DIAGNOSIS — Z720 Tobacco use: Secondary | ICD-10-CM

## 2016-06-08 DIAGNOSIS — E7800 Pure hypercholesterolemia, unspecified: Secondary | ICD-10-CM

## 2016-06-08 DIAGNOSIS — K2100 Gastro-esophageal reflux disease with esophagitis, without bleeding: Secondary | ICD-10-CM | POA: Insufficient documentation

## 2016-06-08 NOTE — Progress Notes (Signed)
Subjective:    [name], male    DOB: 06-30-1965, 51 y.o.   MRN: [id number]  Chief Complaint  Patient presents with  . Abdominal Pain    having abdominal discomfort and follow up on bp meds     HPI:  Isaac Patel is a 51 y.o. male who  has a past medical history of CAD (coronary artery disease); Depression; GERD (gastroesophageal reflux disease); Headache; HLD (hyperlipidemia); NSTEMI (non-ST elevated myocardial infarction) ([place]) (10/28/2014); and OSA (obstructive sleep apnea). and presents today for a follow up office visit.  1.) Hypertension - newly diagnosed hypertension while in the hospital with chest pain with concern for cardiac origin. Workup was negative with cardiac catheterization showing normal stent without occlusion. He was started on lisinopril. Reports taking the medication as prescribed and denies adverse side effects or cough. Not currently checking blood pressures at home. He has discontinued smoking. Denies worst headache of life with no new symptoms of end organ damage. Working on following a low-sodium diet.  BP Readings from Last 3 Encounters:  06/08/16 122/80  05/25/16 132/83  02/09/16 124/88   2.) Chest/Epigastric pain -recently evaluated in the emergency department and admitted to the hospital with centralized chest pain and epigastric pain similar to when he was found to have 80% blockage of his LAD. Cardiac workup was generally negative with cardiac catheterization being normal. He does continue to experience associated symptoms of discomfort located in his upper abdomen and lower chest. Has noted some nausea on occasion denies vomiting, constipation, diarrhea, or blood in stool. No NSAID use. Modifying factors include pantoprazole which he reports taking as prescribed and denies adverse side effects. Symptoms are generally worse after eating. Most recent bowel movement was within the last 24 hours. Reports eating and drinking without  difficulty. Requesting referral to gastroenterology.  3.) Hyperlipidemia - currently maintained on atorvastatin with no adverse side effects or myalgias. Reports taking the medication as prescribed. Due for lipid profile. Working on following a low-fat diet.  Allergies  Allergen Reactions  . Morphine And Related Itching    Outpatient Medications Prior to Visit  Medication Sig Dispense Refill  . aspirin 81 MG tablet Take 1 tablet (81 mg total) by mouth daily. 30 tablet   . atorvastatin (LIPITOR) 80 MG tablet Take 1 tablet (80 mg total) by mouth daily at 6 PM. 90 tablet 3  . fluticasone (FLONASE) 50 MCG/ACT nasal spray Place 2 sprays into both nostrils daily as needed for allergies or rhinitis.     Marland Kitchen lisinopril (PRINIVIL,ZESTRIL) 10 MG tablet Take 1 tablet (10 mg total) by mouth daily. 30 tablet 6  . Multiple Vitamin (MULTI-VITAMIN PO) Take 1 tablet by mouth daily.    . nitroGLYCERIN (NITROSTAT) 0.4 MG SL tablet Place 1 tablet (0.4 mg total) under the tongue every 5 (five) minutes x 3 doses as needed for chest pain. 25 tablet 12  . nystatin ointment (MYCOSTATIN) Apply 1 application topically daily as needed (arm rash).    . pantoprazole (PROTONIX) 40 MG tablet Take 1 tablet (40 mg total) by mouth daily. 90 tablet 1  . ticagrelor (BRILINTA) 60 MG TABS tablet Take 1 tablet (60 mg total) by mouth 2 (two) times daily. 180 tablet 3   No facility-administered medications prior to visit.       Past Surgical History:  Procedure Laterality Date  . 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 CARDIAC CATHETERIZATION N/A 10/29/2014   Procedure: Left Heart  Cath and Coronary Angiography;  Surgeon: Troy Sine, MD;  Location: [facility];  Service: Cardiovascular;  Laterality: N/A;  . CHOLESTEATOMA EXCISION Right 1990's  . LEFT HEART CATH AND CORONARY ANGIOGRAPHY N/A 05/25/2016   Procedure: Left Heart Cath and Coronary Angiography;  Surgeon: Sherren Mocha, MD;  Location:  [facility];  Service: Cardiovascular;  Laterality: N/A;  . TYMPANOSTOMY TUBE PLACEMENT Bilateral "as a kid"   "had one done as an adult too"      Past Medical History:  Diagnosis Date  . CAD (coronary artery disease)    a.  Non-STEMI 8/16: LHC-proximal LAD 80% treated with a resolute DES, EF 50-55%;  b.  Echo 8/16:  Moderate LVH, EF 55-60%, normal wall motion, normal diastolic function, normal RV function  . Depression    hx  . GERD (gastroesophageal reflux disease)   . Headache    "maybe monthly" (10/28/2014)  . HLD (hyperlipidemia)   . NSTEMI (non-ST elevated myocardial infarction) ([place]) 10/28/2014  . OSA (obstructive sleep apnea)    "tried mask; wear it off and on" (10/28/2014)      Review of Systems  Constitutional: Negative for chills and fever.  Eyes:       Negative for changes in vision  Respiratory: Negative for cough, chest tightness and wheezing.   Cardiovascular: Negative for chest pain, palpitations and leg swelling.  Gastrointestinal: Positive for abdominal pain. Negative for blood in stool, constipation, diarrhea, nausea and vomiting.  Neurological: Negative for dizziness, weakness and light-headedness.      Objective:    BP 122/80 (BP Location: Left Arm, Patient Position: Sitting, Cuff Size: Large)   Pulse 89   Temp 98.4 F (36.9 C) (Oral)   Resp 16   Ht 6' (1.829 m)   Wt 213 lb (96.6 kg)   SpO2 97%   BMI 28.89 kg/m  Nursing note and vital signs reviewed.  Physical Exam  Constitutional: He is oriented to person, place, and time. He appears well-developed and well-nourished. No distress.  Cardiovascular: Normal rate, regular rhythm, normal heart sounds and intact distal pulses.   Pulmonary/Chest: Effort normal and breath sounds normal.  Abdominal: Normal appearance and bowel sounds are normal. He exhibits distension. He exhibits no mass. There is no hepatosplenomegaly. There is no tenderness. There is no rigidity, no rebound, no guarding, no CVA  tenderness, no tenderness at McBurney's point and negative Murphy's sign.  Neurological: He is alert and oriented to person, place, and time.  Skin: Skin is warm and dry.  Psychiatric: He has a normal mood and affect. His behavior is normal. Judgment and thought content normal.       Assessment & Plan:   Problem List Items Addressed This Visit      Cardiovascular and Mediastinum   Essential hypertension - Primary    Newly diagnosed hypertension while in the hospital. Blood pressure below goal 140/90 with current medication regimen and no adverse side effects or cough. Denies worst headache of life with no symptoms of end organ damage noted on physical exam. Encouraged to follow low-sodium diet and monitor blood pressure at home daily with different times throughout the day. Obtain complete metabolic profile to check potassium. Continue current dosage of lisinopril.        Digestive   GERD with esophagitis    Symptoms and exam appear consistent with gastroesophageal reflux or peptic ulcer disease. Increase pantoprazole to 40 mg twice daily with referral to gastroenterology placed for further assessment and treatment.  Information provided on GERD related foods. Obtain amylase, lipase, and complete metabolic profile. Continue to monitor pending referral to gastroenterology.      Relevant Orders   Comp Met (CMET)   Lipase   Amylase   Ambulatory referral to Gastroenterology     Other   Tobacco abuse    Has quit tobacco use and has been tobacco free for the past 15 days. Encouraged to complete continue remaining tobacco free. Continue to monitor.      HLD (hyperlipidemia)    Currently maintained on atorvastatin with no adverse side effects or myalgias. Obtain lipid profile. Continue current dosage of atorvastatin pending lipid profile results.      Relevant Orders   Lipid Profile       I am having Mr. Weigel maintain his fluticasone, nitroGLYCERIN, aspirin, atorvastatin,  ticagrelor, pantoprazole, Multiple Vitamin (MULTI-VITAMIN PO), nystatin ointment, and lisinopril.   Follow-up: Return in about 2 months (around 08/08/2016), or if symptoms worsen or fail to improve.  Mauricio Po, FNP

## 2016-06-08 NOTE — Assessment & Plan Note (Signed)
Symptoms and exam appear consistent with gastroesophageal reflux or peptic ulcer disease. Increase pantoprazole to 40 mg twice daily with referral to gastroenterology placed for further assessment and treatment. Information provided on GERD related foods. Obtain amylase, lipase, and complete metabolic profile. Continue to monitor pending referral to gastroenterology.

## 2016-06-08 NOTE — Assessment & Plan Note (Signed)
Currently maintained on atorvastatin with no adverse side effects or myalgias. Obtain lipid profile. Continue current dosage of atorvastatin pending lipid profile results. 

## 2016-06-08 NOTE — Assessment & Plan Note (Signed)
Has quit tobacco use and has been tobacco free for the past 15 days. Encouraged to complete continue remaining tobacco free. Continue to monitor.

## 2016-06-08 NOTE — Assessment & Plan Note (Signed)
Newly diagnosed hypertension while in the hospital. Blood pressure below goal 140/90 with current medication regimen and no adverse side effects or cough. Denies worst headache of life with no symptoms of end organ damage noted on physical exam. Encouraged to follow low-sodium diet and monitor blood pressure at home daily with different times throughout the day. Obtain complete metabolic profile to check potassium. Continue current dosage of lisinopril.

## 2016-06-08 NOTE — Patient Instructions (Signed)
Thank you for choosing [facility].  SUMMARY AND INSTRUCTIONS:  They will call to schedule your appointment with gastroenterology.   Great job quitting smoking.  Medication:  Please increase your pantoprazole to 2x per day.  Continue with the lisinopril.   Your prescription(s) have been submitted to your pharmacy or been printed and provided for you. Please take as directed and contact our office if you believe you are having problem(s) with the medication(s) or have any questions.  Labs:  Please stop by the lab on the lower level of the building for your blood work. Your results will be released to [place] (or called to you) after review, usually within 72 hours after test completion. If any changes need to be made, you will be notified at that same time.  1.) The lab is open from 7:30am to 5:30 pm Monday-Friday 2.) No appointment is necessary 3.) Fasting (if needed) is 6-8 hours after food and drink; black coffee and water are okay   Referrals:  Referrals have been made during this visit. You should expect to hear back from our schedulers in about 7-10 days in regards to establishing an appointment with the specialists we discussed.   Follow up:  If your symptoms worsen or fail to improve, please contact our office for further instruction, or in case of emergency go directly to the emergency room at the closest medical facility.    Food Choices for Gastroesophageal Reflux Disease, Adult When you have gastroesophageal reflux disease (GERD), the foods you eat and your eating habits are very important. Choosing the right foods can help ease your discomfort. What guidelines do I need to follow?  Choose fruits, vegetables, whole grains, and low-fat dairy products.  Choose low-fat meat, fish, and poultry.  Limit fats such as oils, salad dressings, butter, nuts, and avocado.  Keep a food diary. This helps you identify foods that cause symptoms.  Avoid foods that cause  symptoms. These may be different for everyone.  Eat small meals often instead of 3 large meals a day.  Eat your meals slowly, in a place where you are relaxed.  Limit fried foods.  Cook foods using methods other than frying.  Avoid drinking alcohol.  Avoid drinking large amounts of liquids with your meals.  Avoid bending over or lying down until 2-3 hours after eating. What foods are not recommended? These are some foods and drinks that may make your symptoms worse: Vegetables  Tomatoes. Tomato juice. Tomato and spaghetti sauce. Chili peppers. Onion and garlic. Horseradish. Fruits  Oranges, grapefruit, and lemon (fruit and juice). Meats  High-fat meats, fish, and poultry. This includes hot dogs, ribs, ham, sausage, salami, and bacon. Dairy  Whole milk and chocolate milk. Sour cream. Cream. Butter. Ice cream. Cream cheese. Drinks  Coffee and tea. Bubbly (carbonated) drinks or energy drinks. Condiments  Hot sauce. Barbecue sauce. Sweets/Desserts  Chocolate and cocoa. Donuts. Peppermint and spearmint. Fats and Oils  High-fat foods. This includes [place] fries and potato chips. Other  Vinegar. Strong spices. This includes black pepper, white pepper, red pepper, cayenne, curry powder, cloves, ginger, and chili powder. The items listed above may not be a complete list of foods and drinks to avoid. Contact your dietitian for more information.  This information is not intended to replace advice given to you by your health care provider. Make sure you discuss any questions you have with your health care provider. Document Released: 09/14/2011 Document Revised: 08/21/2015 Document Reviewed: 01/17/2013 Elsevier Interactive Patient Education  2017 Elsevier Inc.   

## 2016-06-09 ENCOUNTER — Other Ambulatory Visit (INDEPENDENT_AMBULATORY_CARE_PROVIDER_SITE_OTHER): Payer: 59

## 2016-06-09 ENCOUNTER — Encounter: Payer: Self-pay | Admitting: Family

## 2016-06-09 DIAGNOSIS — E7800 Pure hypercholesterolemia, unspecified: Secondary | ICD-10-CM

## 2016-06-09 DIAGNOSIS — K2100 Gastro-esophageal reflux disease with esophagitis, without bleeding: Secondary | ICD-10-CM

## 2016-06-09 LAB — COMPREHENSIVE METABOLIC PANEL
ALBUMIN: 4.3 g/dL (ref 3.5–5.2)
ALK PHOS: 60 U/L (ref 39–117)
ALT: 28 U/L (ref 0–53)
AST: 18 U/L (ref 0–37)
BUN: 16 mg/dL (ref 6–23)
CALCIUM: 9.5 mg/dL (ref 8.4–10.5)
CHLORIDE: 104 meq/L (ref 96–112)
CO2: 29 mEq/L (ref 19–32)
Creatinine, Ser: 1.1 mg/dL (ref 0.40–1.50)
GFR: 74.86 mL/min (ref >60.00)
Glucose, Bld: 104 mg/dL — ABNORMAL HIGH (ref 70–99)
POTASSIUM: 4.3 meq/L (ref 3.5–5.1)
SODIUM: 140 meq/L (ref 135–145)
TOTAL PROTEIN: 7 g/dL (ref 6.0–8.3)
Total Bilirubin: 1 mg/dL (ref 0.2–1.2)

## 2016-06-09 LAB — LIPID PANEL
CHOL/HDL RATIO: 3
Cholesterol: 113 mg/dL (ref 0–200)
HDL: 36.8 mg/dL — AB (ref >39.00)
LDL Cholesterol: 58 mg/dL (ref 0–99)
NONHDL: 75.76
Triglycerides: 88 mg/dL (ref 0.0–149.0)
VLDL: 17.6 mg/dL (ref 0.0–40.0)

## 2016-06-09 LAB — AMYLASE: AMYLASE: 43 U/L (ref 27–131)

## 2016-06-09 LAB — LIPASE: LIPASE: 14 U/L (ref 11.0–59.0)

## 2016-07-08 ENCOUNTER — Encounter: Payer: Self-pay | Admitting: Family

## 2016-07-08 MED ORDER — PANTOPRAZOLE SODIUM 40 MG PO TBEC: 40.0000 mg | DELAYED_RELEASE_TABLET | Freq: Every day | ORAL | 1 refills | Status: DC

## 2016-07-09 ENCOUNTER — Encounter: Payer: Self-pay | Admitting: Nurse Practitioner

## 2016-07-12 ENCOUNTER — Other Ambulatory Visit: Payer: Self-pay | Admitting: *Deleted

## 2016-07-12 ENCOUNTER — Encounter: Payer: Self-pay | Admitting: Physician Assistant

## 2016-07-12 MED ORDER — TICAGRELOR 60 MG PO TABS
60.0000 mg | ORAL_TABLET | Freq: Two times a day (BID) | ORAL | 3 refills | Status: DC
Start: 2016-07-12 — End: 2016-12-31

## 2016-07-14 ENCOUNTER — Emergency Department (HOSPITAL_COMMUNITY): Payer: 59

## 2016-07-14 ENCOUNTER — Encounter (HOSPITAL_COMMUNITY): Payer: Self-pay | Admitting: *Deleted

## 2016-07-14 ENCOUNTER — Emergency Department (HOSPITAL_COMMUNITY)
Admission: EM | Admit: 2016-07-14 | Discharge: 2016-07-14 | Disposition: A | Discharge status: Home / Self Care | Payer: 59 | Attending: Emergency Medicine | Admitting: Emergency Medicine

## 2016-07-14 DIAGNOSIS — I2510 Atherosclerotic heart disease of native coronary artery without angina pectoris: Secondary | ICD-10-CM | POA: Diagnosis not present

## 2016-07-14 DIAGNOSIS — I252 Old myocardial infarction: Secondary | ICD-10-CM | POA: Insufficient documentation

## 2016-07-14 DIAGNOSIS — F17210 Nicotine dependence, cigarettes, uncomplicated: Secondary | ICD-10-CM | POA: Insufficient documentation

## 2016-07-14 DIAGNOSIS — R079 Chest pain, unspecified: Secondary | ICD-10-CM | POA: Insufficient documentation

## 2016-07-14 DIAGNOSIS — Z7982 Long term (current) use of aspirin: Secondary | ICD-10-CM | POA: Diagnosis not present

## 2016-07-14 LAB — CBC
HCT: 43.4 % (ref 39.0–52.0)
Hemoglobin: 15.2 g/dL (ref 13.0–17.0)
MCH: 31.9 pg (ref 26.0–34.0)
MCHC: 35 g/dL (ref 30.0–36.0)
MCV: 91 fL (ref 78.0–100.0)
Platelets: 212 10*3/uL (ref 150–400)
RBC: 4.77 MIL/uL (ref 4.22–5.81)
RDW: 12.2 % (ref 11.5–15.5)
WBC: 7.5 10*3/uL (ref 4.0–10.5)

## 2016-07-14 LAB — I-STAT TROPONIN, ED
Troponin i, poc: 0 ng/mL (ref 0.00–0.08)
Troponin i, poc: 0 ng/mL (ref 0.00–0.08)

## 2016-07-14 LAB — BASIC METABOLIC PANEL
Anion gap: 10 (ref 5–15)
BUN: 15 mg/dL (ref 6–20)
CO2: 22 mmol/L (ref 22–32)
Calcium: 9.2 mg/dL (ref 8.9–10.3)
Chloride: 107 mmol/L (ref 101–111)
Creatinine, Ser: 1.14 mg/dL (ref 0.61–1.24)
GFR calc Af Amer: >60 mL/min (ref >60)
GLUCOSE: 119 mg/dL — AB (ref 65–99)
Potassium: 3.9 mmol/L (ref 3.5–5.1)
SODIUM: 139 mmol/L (ref 135–145)

## 2016-07-14 MED ORDER — NITROGLYCERIN 0.4 MG SL SUBL
0.4000 mg | SUBLINGUAL_TABLET | Freq: Once | SUBLINGUAL | Status: AC
  Administered 2016-07-14: 0.4 mg via SUBLINGUAL
  Filled 2016-07-14: qty 1

## 2016-07-14 MED ORDER — ASPIRIN 81 MG PO CHEW
162.0000 mg | CHEWABLE_TABLET | Freq: Once | ORAL | Status: AC
  Administered 2016-07-14: 162 mg via ORAL
  Filled 2016-07-14: qty 2

## 2016-07-14 NOTE — ED Notes (Signed)
Wheeled pt back to room from waiting room, pt placed in gown and on monitor. 

## 2016-07-14 NOTE — ED Notes (Signed)
Patient at xray

## 2016-07-14 NOTE — ED Notes (Signed)
EDP at bedside  

## 2016-07-14 NOTE — ED Provider Notes (Signed)
[facility] DEPT Provider Note   CSN: [id number] Arrival date & time: 07/14/16  1038     History   Chief Complaint Chief Complaint  Patient presents with  . Chest Pain    HPI Isaac Patel is a 51 y.o. male.  The history is provided by the patient and medical records. No language interpreter was used.      Isaac Patel is a 51 y.o. male  with a PMH of NSTEMI in 2016, HTN, HLD who presents to the Emergency Department complaining of a "sudden jolt of pain" to the left about 25 minutes prior to arrival. Patient states pain was severe for a few minutes, then slowly improved. Patient states that he still feels residual pain to the area but much better. He denies any shortness of breath, jaw pain, arm pain, abdominal pain. He says he sat down after initial onset and did feel a little sweaty. He takes a baby aspirin daily, including this morning. He is a daily smoker as well. He had a negative heart cath in February 2018.  Past Medical History:  Diagnosis Date  . CAD (coronary artery disease)    a.  Non-STEMI 8/16: LHC-proximal LAD 80% treated with a resolute DES, EF 50-55%;  b.  Echo 8/16:  Moderate LVH, EF 55-60%, normal wall motion, normal diastolic function, normal RV function  . Depression    hx  . GERD (gastroesophageal reflux disease)   . Headache    "maybe monthly" (10/28/2014)  . HLD (hyperlipidemia)   . NSTEMI (non-ST elevated myocardial infarction) ([place]) 10/28/2014  . OSA (obstructive sleep apnea)    "tried mask; wear it off and on" (10/28/2014)    Patient Active Problem List   Diagnosis Date Noted  . GERD with esophagitis 06/08/2016  . Essential hypertension 06/08/2016  . Chest pain with moderate risk for cardiac etiology 05/24/2016  . 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 Anxiety state 11/15/2014  . CAD (coronary artery disease) 10/30/2014  . Tobacco abuse 10/30/2014  . HLD (hyperlipidemia) 10/30/2014  . History of non-ST elevation myocardial  infarction (NSTEMI) 10/28/2014  . Routine general medical examination at a health care facility 05/29/2014  . OSA (obstructive sleep apnea) 05/08/2014  . 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 CARDIAC CATHETERIZATION N/A 10/29/2014   Procedure: Left Heart Cath and Coronary Angiography;  Surgeon: Troy Sine, MD;  Location: [facility];  Service: Cardiovascular;  Laterality: N/A;  . CHOLESTEATOMA EXCISION Right 1990's  . LEFT HEART CATH AND CORONARY ANGIOGRAPHY N/A 05/25/2016   Procedure: Left Heart Cath and Coronary Angiography;  Surgeon: Sherren Mocha, MD;  Location: [facility];  Service: Cardiovascular;  Laterality: N/A;  . TYMPANOSTOMY TUBE PLACEMENT Bilateral "as a kid"   "had one done as an adult too"       Home Medications    Prior to Admission medications   Medication Sig Start Date End Date Taking? Authorizing Provider  aspirin 81 MG tablet Take 1 tablet (81 mg total) by mouth daily. 02/03/16   Sueanne Margarita, MD  atorvastatin (LIPITOR) 80 MG tablet Take 1 tablet (80 mg total) by mouth daily at 6 PM. 03/24/16   Sueanne Margarita, MD  fluticasone (FLONASE) 50 MCG/ACT nasal spray Place 2 sprays into both nostrils daily as needed for allergies or rhinitis.     Historical Provider, MD  lisinopril (PRINIVIL,ZESTRIL) 10 MG tablet Take 1 tablet (10 mg total) by mouth daily. 05/25/16   Cheryln Manly, NP  Multiple Vitamin (MULTI-VITAMIN PO) Take 1 tablet by mouth daily.    Historical Provider, MD  nitroGLYCERIN (NITROSTAT) 0.4 MG SL tablet Place 1 tablet (0.4 mg total) under the tongue every 5 (five) minutes x 3 doses as needed for chest pain. 10/30/14   Brett Canales, PA-C  nystatin ointment (MYCOSTATIN) Apply 1 application topically daily as needed (arm rash).    Historical Provider, MD  pantoprazole (PROTONIX) 40 MG tablet Take 1 tablet (40 mg total) by mouth  daily. 07/08/16   Golden Circle, FNP  ticagrelor (BRILINTA) 60 MG TABS tablet Take 1 tablet (60 mg total) by mouth 2 (two) times daily. 07/12/16   Sueanne Margarita, MD    Family History Family History  Problem Relation Age of Onset  . Healthy Mother   . Healthy Father   . Multiple sclerosis Maternal Grandfather     Social History Social History  Substance Use Topics  . Smoking status: Current Some Day Smoker    Packs/day: 0.33    Years: 32.00    Types: Cigarettes  . Smokeless tobacco: Never Used  . Alcohol use 1.2 - 1.8 oz/week    2 - 3 Standard drinks or equivalent per week     Comment: stopped 06/19/2015     Allergies   Morphine and related   Review of Systems Review of Systems  Cardiovascular: Positive for chest pain.  All other systems reviewed and are negative.    Physical Exam Updated Vital Signs BP 107/81   Pulse (!) 58   Temp 98.1 F (36.7 C) (Oral)   Resp 13   SpO2 97%   Physical Exam  Constitutional: He is oriented to person, place, and time. He appears well-developed and well-nourished. No distress.  HENT:  Head: Normocephalic and atraumatic.  Neck: No JVD present.  Cardiovascular: Normal rate, regular rhythm and normal heart sounds.   No murmur heard. Pulmonary/Chest: Effort normal and breath sounds normal. No respiratory distress. He has no wheezes. He has no rales. He exhibits tenderness.  Abdominal: Soft. He exhibits no distension. There is no tenderness.  Musculoskeletal: He exhibits no edema.  Neurological: He is alert and oriented to person, place, and time.  Skin: Skin is warm and dry.  Nursing note and vitals reviewed.    ED Treatments / Results  Labs (all labs ordered are listed, but only abnormal results are displayed) Labs Reviewed  BASIC METABOLIC PANEL - Abnormal; Notable for the following:       Result Value   Glucose, Bld 119 (*)    All other components within normal limits  CBC  I-STAT TROPOININ, ED  I-STAT TROPOININ,  ED    EKG  EKG Interpretation  Date/Time:  Wednesday July 14 2016 10:42:57 EDT Ventricular Rate:  82 PR Interval:  138 QRS Duration: 86 QT Interval:  378 QTC Calculation: 441 R Axis:   44 Text Interpretation:  Normal sinus rhythm Possible Anterior infarct , age undetermined Abnormal ECG No significant change was found Confirmed by Venora Maples  MD, KEVIN ([id number]) on 07/14/2016 11:46:30 AM       Radiology Dg Chest 2 View  Result Date: 07/14/2016 CLINICAL DATA:  Sharp chest pain lasting 45 minutes. History of coronary artery disease with stent placement, NSTEMI in 2016. EXAM: CHEST  2 VIEW COMPARISON:  PA and lateral chest x-ray of May 24, 2016 FINDINGS: The  lungs are well-expanded. The heart and pulmonary vascularity are normal. The mediastinum is normal in width. There is no pleural effusion. The bony thorax exhibits no acute abnormality. IMPRESSION: There is no CHF, pneumonia, nor other acute cardiopulmonary disease. Electronically Signed   By: David  Martinique M.D.   On: 07/14/2016 11:25    Procedures Procedures (including critical care time)  Medications Ordered in ED Medications  aspirin chewable tablet 162 mg (162 mg Oral Given 07/14/16 1139)  nitroGLYCERIN (NITROSTAT) SL tablet 0.4 mg (0.4 mg Sublingual Given 07/14/16 1139)     Initial Impression / Assessment and Plan / ED Course  I have reviewed the triage vital signs and the nursing notes.  Pertinent labs & imaging results that were available during my care of the patient were reviewed by me and considered in my medical decision making (see chart for details).    Isaac Patel is a 51 y.o. male who presents to ED for chest pain. Hx of NSTEMI two years ago with reassuring left heart cath. LAD which was prior area of occlusion was 0% stenosed. Heart score of 3. Chest is tender on exam. Doubt ACS. Troponin negative. CBC and BMP reassuring. CXR negative. 2nd troponin also negative. Evaluation does not show pathology that would  require ongoing emergent intervention or inpatient treatment. Will have patient follow up with cardiology. Return precautions discussed and all questions answered.   Patient discussed with Dr. Venora Maples who agrees with treatment plan.   Final Clinical Impressions(s) / ED Diagnoses   Final diagnoses:  Chest pain, unspecified type    New Prescriptions Discharge Medication List as of 07/14/2016  3:01 PM       [facility] Raymond Bhardwaj, PA-C 07/14/16 Mar-Mac, MD 07/14/16 [phone]

## 2016-07-14 NOTE — ED Notes (Signed)
Pt is in stable condition upon d/c and ambulates from ED. 

## 2016-07-14 NOTE — ED Triage Notes (Signed)
PT states 20 minutes had a sudden jolt of pain to the upper left quadrant of chest and now is dull,.  Pts states mild sweating.  Pt has a cardiac stent LAD.

## 2016-07-14 NOTE — Discharge Instructions (Signed)
Please call your cardiologist today or tomorrow to schedule a follow up appointment.  Return to ER for chest pain worse with exertion, difficulty breathing, new or worsening symptoms, any additional concerns.

## 2016-07-15 ENCOUNTER — Telehealth: Payer: Self-pay

## 2016-07-15 NOTE — Telephone Encounter (Signed)
Boss, Danielsen - 07/14/16 << Less Detail',event)" href="javascript:;"><< Less Detail    Sueanne Margarita, MD  Sent: Wed July 14, 2016 3:20 PM  To: Theodoro Parma, RN            Please get patient in to see extender    Traci  ----- Message -----  From: Verdie Mosher, RN  Sent: 07/14/2016  3:11 PM  To: Sueanne Margarita, MD    Left message to call back to schedule appointment.

## 2016-07-16 NOTE — Telephone Encounter (Signed)
Scheduling arranged OV with Dr. Radford Pax 5/14. Left message to patient to call back to speak with the nurse to arrange appropriate sooner follow-up.

## 2016-07-16 NOTE — Telephone Encounter (Signed)
Rescheduled patient to April 30 at 1545 with Scott (per patient request).  He understands to call if symptoms develop prior to that time.

## 2016-07-19 ENCOUNTER — Ambulatory Visit: Payer: 59 | Admitting: Nurse Practitioner

## 2016-07-21 ENCOUNTER — Encounter: Payer: Self-pay | Admitting: Nurse Practitioner

## 2016-07-21 ENCOUNTER — Other Ambulatory Visit: Payer: 59

## 2016-07-21 ENCOUNTER — Ambulatory Visit (INDEPENDENT_AMBULATORY_CARE_PROVIDER_SITE_OTHER): Payer: 59 | Admitting: Nurse Practitioner

## 2016-07-21 VITALS — BP 108/80 | HR 88 | Ht 71.0 in | Wt 210.0 lb

## 2016-07-21 DIAGNOSIS — R1010 Upper abdominal pain, unspecified: Secondary | ICD-10-CM | POA: Diagnosis not present

## 2016-07-21 DIAGNOSIS — R140 Abdominal distension (gaseous): Secondary | ICD-10-CM | POA: Diagnosis not present

## 2016-07-21 DIAGNOSIS — K219 Gastro-esophageal reflux disease without esophagitis: Secondary | ICD-10-CM

## 2016-07-21 NOTE — Patient Instructions (Addendum)
If you are age 53 or older, your body mass index should be between 23-30. Your Body mass index is 29.29 kg/m. If this is out of the aforementioned range listed, please consider follow up with your Primary Care Provider.  If you are age 2 or younger, your body mass index should be between 19-25. Your Body mass index is 29.29 kg/m. If this is out of the aformentioned range listed, please consider follow up with your Primary Care Provider.   You have been scheduled for an abdominal ultrasound at [facility] (1st floor of hospital) on 07/27/16 at 800 am. Please arrive 15 minutes prior to your appointment for registration. Make certain not to have anything to eat or drink 6 hours prior to your appointment. Should you need to reschedule your appointment, please contact radiology at [phone]. This test typically takes about 30 minutes to perform.  Your physician has requested that you go to the basement for lab work before leaving today.  Follow up appointment with Dr. Fuller Plan 08/26/16 at 900 am.  Thank you for choosing me and [facility].  Tye Savoy, NP

## 2016-07-21 NOTE — Progress Notes (Signed)
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 He is referred by PCP Mauricio Po, FNP for GERD. He had a gastroenterologist up [place] and has had both upper and lower endoscopies. He had pencil finding of an esophageal stricture which was dilated proximal to 10 years ago. Records are not available yet but patient tells me his last EGD and colonoscopy were 4-5 years ago.  Patient has a history coronary artery disease, status post stent placement 2 years ago, he is on Brilinta . He was admitted late February with chest pain. Enzymes negative, EKG stable. He underwent left heart catheter showing stable coronary artery disease and patent LAD stent. He was in the emergency department on the eighteenth of this month with recurrent chest pain. Troponin negative, chest x-ray negative. Low suspicion for ACS. Patient describes vague chest discomfort. The discomfort isn't intermittent ache, possibly related to food, especially citrus. He has slight shortness of breath sometimes. He takes a daily PPI. No dysphagia or odynophagia. He also complains of upper abdominal discomfort and a dorsal left upper quadrant pain, nonradiating for the most part. He has associated bloating over the last few years. He has occasional nausea that comes in waves. No weight loss, weight is actually up. No NSAID use, takes Tylenol as needed.  Last colonoscopy April for 45 years ago, no polyps per patient. His stools are soft, no blood but they are little greasy with oily residue left in the toilet. Stool frequency only 1-2 times a day.    Past Medical History:  Diagnosis Date  . CAD (coronary artery disease)    a.  Non-STEMI 8/16: LHC-proximal LAD 80% treated with a resolute DES, EF 50-55%;  b.  Echo 8/16:  Moderate LVH, EF 55-60%, normal wall motion, normal diastolic function, normal RV function  . Depression    hx  . GERD (gastroesophageal reflux disease)   . Headache    "maybe monthly" (10/28/2014)  . HLD (hyperlipidemia)   . NSTEMI (non-ST elevated myocardial infarction) ([place]) 10/28/2014  . OSA (obstructive sleep apnea)    "tried mask; wear it off and on" (10/28/2014)     Past Surgical History:  Procedure Laterality Date  . 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 CARDIAC CATHETERIZATION N/A 10/29/2014   Procedure: Left Heart Cath and Coronary Angiography;  Surgeon: Troy Sine, MD;  Location: [facility];  Service: Cardiovascular;  Laterality: N/A;  . CHOLESTEATOMA EXCISION Right 1990's  . LEFT HEART CATH AND CORONARY ANGIOGRAPHY N/A 05/25/2016   Procedure: Left Heart Cath and Coronary Angiography;  Surgeon: Sherren Mocha, MD;  Location: [facility];  Service: Cardiovascular;  Laterality: N/A;  . TYMPANOSTOMY TUBE PLACEMENT Bilateral "as a kid"   "had one done as an adult too"   Family History  Problem Relation Age of Onset  . Diverticulitis Mother   . Healthy Father   . Multiple sclerosis Maternal Grandfather   . Colon cancer Neg Hx   . Stomach cancer Neg Hx   . Rectal cancer Neg Hx   . Liver cancer Neg Hx   . Esophageal cancer Neg Hx    Social History  Substance Use Topics  . Smoking status: Current Some Day Smoker    Packs/day: 0.33    Years: 32.00    Types: Cigarettes  . Smokeless tobacco: Never Used  . Alcohol use 1.2 - 1.8 oz/week    2 -  3 Standard drinks or equivalent per week     Comment: stopped 06/19/2015   Current Outpatient Prescriptions  Medication Sig Dispense Refill  . aspirin 81 MG tablet Take 1 tablet (81 mg total) by mouth daily. 30 tablet   . atorvastatin (LIPITOR) 80 MG tablet Take 1 tablet (80 mg total) by mouth daily at 6 PM. 90 tablet 3  . fluticasone (FLONASE) 50 MCG/ACT nasal spray Place 2 sprays into both nostrils daily as needed for allergies or rhinitis.     Marland Kitchen lisinopril (PRINIVIL,ZESTRIL) 10 MG tablet Take 1 tablet (10 mg total) by mouth daily. 30 tablet 6  . Multiple  Vitamin (MULTI-VITAMIN PO) Take 1 tablet by mouth daily.    . nitroGLYCERIN (NITROSTAT) 0.4 MG SL tablet Place 1 tablet (0.4 mg total) under the tongue every 5 (five) minutes x 3 doses as needed for chest pain. 25 tablet 12  . nystatin ointment (MYCOSTATIN) Apply 1 application topically daily as needed (arm rash).    . pantoprazole (PROTONIX) 40 MG tablet Take 1 tablet (40 mg total) by mouth daily. 90 tablet 1  . ticagrelor (BRILINTA) 60 MG TABS tablet Take 1 tablet (60 mg total) by mouth 2 (two) times daily. 180 tablet 3   No current facility-administered medications for this visit.    Allergies  Allergen Reactions  . Morphine And Related Itching     Review of Systems: Positive for back pain, vision changes, hearing problems, sleeping problems and sore throat. All systems reviewed and negative except where noted in HPI.    Physical Exam: BP 108/80   Pulse 88   Ht 5\' 11"  (1.803 m)   Wt 210 lb (95.3 kg)   BMI 29.29 kg/m  Constitutional:  Well-developed, white  male in no acute distress. Psychiatric: Normal mood and affect. Behavior is normal. EENT:  Conjunctivae are normal. No scleral icterus. Neck supple.  Cardiovascular: Normal rate, regular rhythm.  Pulmonary/chest: Effort normal and breath sounds normal. No wheezing, rales or rhonchi. Abdominal: Soft, nondistended, nontender. Bowel sounds active throughout. There are no masses palpable. No hepatomegaly. Extremities: LUE Prosthesis. No LE edema Lymphadenopathy: No cervical adenopathy noted. Neurological: Alert and oriented to person place and time. Skin: Skin is warm and dry. No rashes noted.   ASSESSMENT AND PLAN:  59. 51 year old male with chronic GERD. He presents with vague, intermittent chest discomfort, not necessarily related to eating and non-exertional. Patient has a history of coronary artery disease / stent. Admitted February and LHC showed stable disease and patent stent.  -symptoms are chronic and patient agrees  that they aren't really different than what he was evaluated [place] GI [place]  -I need to get patient's EGD reports, it sounds like he's had at least 2 of them done.  -Continue PPI, antireflux precautions for now -Return for follow-up in 4-6 weeks, we should certainly have the records by then   2. Upper abdominal discomfort and bloating, also vague description of symptoms. Bloating developed a couple of years ago but the upper abdominal discomfort dates back even further. Patient describes an oily residue in the toilet after bowel movement. He used to be a heavy drinker. Pancreatic insufficiency should be excluded though would seem unlikely given absence of weight loss, diarrhea, etc.  Recent extensive labs unremarkable -Check fecal elastase -If fecal elastase normal then trial of probiotics -Ultrasound of abdomen  3. Colon cancer screening. Patient reports normal colonoscopy 4-5 years ago.  No alarm features, no family  history of colon cancer. He isn't anemic.  -Obtain colonoscopy report. If exam was complete with a good prep and no polyps found then patient may not be due yet for repeat exam  4. OSA / CPAP  5. Coronary artery disease, stent placement 2 years ago. On Brilinta. Recent LHC >>> stable disease and patent stent  Tye Savoy, NP  07/21/2016, 9:44 AM  Cc:  Golden Circle, FNP

## 2016-07-23 NOTE — Progress Notes (Signed)
Reviewed and agree with management plan.  Lauryl Seyer T. Fue Cervenka, MD FACG 

## 2016-07-26 ENCOUNTER — Ambulatory Visit: Payer: 59 | Admitting: Physician Assistant

## 2016-07-27 ENCOUNTER — Encounter: Payer: Self-pay | Admitting: Nurse Practitioner

## 2016-07-27 ENCOUNTER — Ambulatory Visit (HOSPITAL_COMMUNITY)
Admission: RE | Admit: 2016-07-27 | Discharge: 2016-07-27 | Disposition: A | Discharge status: Home / Self Care | Payer: 59 | Source: Ambulatory Visit | Attending: Nurse Practitioner | Admitting: Nurse Practitioner

## 2016-07-27 DIAGNOSIS — R140 Abdominal distension (gaseous): Secondary | ICD-10-CM | POA: Diagnosis not present

## 2016-07-27 DIAGNOSIS — K760 Fatty (change of) liver, not elsewhere classified: Secondary | ICD-10-CM | POA: Insufficient documentation

## 2016-07-27 DIAGNOSIS — K219 Gastro-esophageal reflux disease without esophagitis: Secondary | ICD-10-CM | POA: Insufficient documentation

## 2016-07-27 DIAGNOSIS — R1010 Upper abdominal pain, unspecified: Secondary | ICD-10-CM | POA: Insufficient documentation

## 2016-07-27 NOTE — Progress Notes (Signed)
Cardiology Office Note:    Date:  07/28/2016   ID:  Isaac Patel, DOB Dec 30, 1965, MRN [id number]  PCP:  Mauricio Po, FNP  Cardiologist:  Dr. [name]   Electrophysiologist:  n/a  Referring MD: Golden Circle, FNP   Chief Complaint  Patient presents with  . Hospitalization Follow-up    ED visit for Chest Pain    History of Present Illness:    Isaac Patel is a 51 y.o. male with a hx of CAD s/p NSTEMI in 8/16 tx with DES to the proximal LAD.  He had significant issues with anxiety after his myocardial infarction and these improved with Klonopin. He was followed as part of the TWILIGHT protocol and came off study drug 02/04/16. He was then transitioned to Brilinta 60 mg bid and ASA 81 mg QD.  He was admitted in 2/18 with chest pain and LHC demonstrated a patent LAD stent.  Med Rx was continued.  He was then seen in the ED 07/14/16 with chest pain.  Troponin levels were neg.  CXR was unremarkable. EKG demonstrated no acute changes.    He returns for follow up.  He is here alone.  His chest pain that prompted his ED visit was sudden and lasted throughout the day.  He has not had a recurrence.  He denies exertional chest pain, significant shortness of breath, syncope, orthopnea, PND, edema.  He sleeps with CPAP.    Prior CV studies:   The following studies were reviewed today:  LHC 05/25/16 LAD prox stent ok LCx irregs RCA normal EF 55  Echo 10/30/14 Moderate LVH, EF 55-60%, normal wall motion, normal diastolic function, normal RV function  LHC 10/29/14 LAD: Proximal-mid 80% EF 50-55% with small focal region of mild mid anterolateral hypokinesis PCI: 3 x 15 mm resolute DES to the LAD  Past Medical History:  Diagnosis Date  . CAD (coronary artery disease)    a.  Non-STEMI 8/16: LHC-proximal LAD 80% treated with a resolute DES, EF 50-55%;  b.  Echo 8/16:  Moderate LVH, EF 55-60%, normal wall motion, normal diastolic function, normal RV function  . Depression    hx  .  GERD (gastroesophageal reflux disease)   . Headache    "maybe monthly" (10/28/2014)  . HLD (hyperlipidemia)   . NSTEMI (non-ST elevated myocardial infarction) ([place]) 10/28/2014  . OSA (obstructive sleep apnea)    "tried mask; wear it off and on" (10/28/2014)    Past Surgical History:  Procedure Laterality Date  . 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 CARDIAC CATHETERIZATION N/A 10/29/2014   Procedure: Left Heart Cath and Coronary Angiography;  Surgeon: Troy Sine, MD;  Location: [facility];  Service: Cardiovascular;  Laterality: N/A;  . CHOLESTEATOMA EXCISION Right 1990's  . LEFT HEART CATH AND CORONARY ANGIOGRAPHY N/A 05/25/2016   Procedure: Left Heart Cath and Coronary Angiography;  Surgeon: Sherren Mocha, MD;  Location: [facility];  Service: Cardiovascular;  Laterality: N/A;  . TYMPANOSTOMY TUBE PLACEMENT Bilateral "as a kid"   "had one done as an adult too"    Current Medications: Current Meds  Medication Sig  . aspirin 81 MG tablet Take 1 tablet (81 mg total) by mouth daily.  Marland Kitchen atorvastatin (LIPITOR) 80 MG tablet Take 1 tablet (80 mg total) by mouth daily at 6 PM.  . fluticasone (FLONASE) 50 MCG/ACT nasal spray Place 2 sprays into both nostrils daily as needed for allergies or rhinitis.   Marland Kitchen  lisinopril (PRINIVIL,ZESTRIL) 10 MG tablet Take 1 tablet (10 mg total) by mouth daily.  . Multiple Vitamin (MULTI-VITAMIN PO) Take 1 tablet by mouth daily.  . nitroGLYCERIN (NITROSTAT) 0.4 MG SL tablet Place 1 tablet (0.4 mg total) under the tongue every 5 (five) minutes x 3 doses as needed for chest pain.  Marland Kitchen nystatin ointment (MYCOSTATIN) Apply 1 application topically daily as needed (arm rash).  . pantoprazole (PROTONIX) 40 MG tablet Take 1 tablet (40 mg total) by mouth daily.  . ticagrelor (BRILINTA) 60 MG TABS tablet Take 1 tablet (60 mg total) by mouth 2 (two) times daily.     Allergies:   Morphine and related   Social History   Social History    . Marital status: Married    Spouse name: N/A  . Number of children: 2  . Years of education: 14   Occupational History  . CMA    Social History Main Topics  . Smoking status: Current Some Day Smoker    Packs/day: 0.33    Years: 32.00    Types: Cigarettes  . Smokeless tobacco: Never Used  . Alcohol use 1.2 - 1.8 oz/week    2 - 3 Standard drinks or equivalent per week     Comment: stopped 06/19/2015  . Drug use: Yes    Types: Marijuana     Comment: 05/24/2016  "stopped marijuana in the early 2000's"  . Sexual activity: Yes   Other Topics Concern  . None   Social History Narrative   Born and raised in [place], [place].  Currently resides in a house with his wife and children. 1 dog. Fun: play computer games, sports.    Denies any religious beliefs effecting health care.    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 Diverticulitis Mother   . Healthy Father   . Multiple sclerosis Maternal Grandfather   . Colon cancer Neg Hx   . Stomach cancer Neg Hx   . Rectal cancer Neg Hx   . Liver cancer Neg Hx   . Esophageal cancer Neg Hx      ROS:   Please see the history of present illness.    Review of Systems  Cardiovascular: Positive for chest pain.  Respiratory: Positive for snoring.   Hematologic/Lymphatic: Bruises/bleeds easily.  Musculoskeletal: Positive for back pain.   All other systems reviewed and are negative.   EKGs/Labs/Other Test Reviewed:    EKG:  EKG is  ordered today.  The ekg ordered today demonstrates NSR, HR 79, normal axis, QTC 438 ms, no change from prior tracings  Recent Labs: 06/09/2016: ALT 28 07/14/2016: BUN 15; Creatinine, Ser 1.14; Hemoglobin 15.2; Platelets 212; Potassium 3.9; Sodium 139   Recent Lipid Panel    Component Value Date/Time   CHOL 113 06/09/2016 0733   TRIG 88.0 06/09/2016 0733   HDL 36.80 (L) 06/09/2016 0733   CHOLHDL 3 06/09/2016 0733    VLDL 17.6 06/09/2016 0733   LDLCALC 58 06/09/2016 0733     Physical Exam:    VS:  BP 112/80   Pulse 79   Ht 5\' 11"  (1.803 m)   Wt 211 lb 12.8 oz (96.1 kg)   BMI 29.54 kg/m     Wt Readings from Last 3 Encounters:  07/28/16 211 lb 12.8 oz (96.1 kg)  07/21/16 210 lb (95.3 kg)  06/08/16 213  lb (96.6 kg)     Physical Exam  Constitutional: He is oriented to person, place, and time. He appears well-developed and well-nourished. No distress.  HENT:  Head: Normocephalic and atraumatic.  Eyes: No scleral icterus.  Neck: Normal range of motion. No JVD present.  Cardiovascular: Normal rate, regular rhythm, S1 normal and S2 normal.   No murmur heard. Pulmonary/Chest: Effort normal. He has decreased breath sounds. He has no wheezes. He has no rhonchi. He has no rales.  Abdominal: Soft. There is no tenderness.  Musculoskeletal: He exhibits deformity (L arm amputation below elbow). He exhibits no edema.  Neurological: He is alert and oriented to person, place, and time.  Skin: Skin is warm and dry.  Psychiatric: He has a normal mood and affect.    ASSESSMENT:    1. Coronary artery disease involving native coronary artery of native heart without angina pectoris   2. Essential hypertension   3. Hyperlipidemia, unspecified hyperlipidemia type   4. Tobacco abuse    PLAN:    In order of problems listed above:  1. Coronary artery disease involving native coronary artery of native heart without angina pectoris - s/p NSTEMI in 2016 tx with DES to the LAD.  LHC In 2/18 with patent stent in the LAD and no significant disease elsewhere.  Recent ED visit with chest pain.  Symptoms were atypical and Troponin was normal.  His ECG is unchanged.  No further ischemic workup needed. Continue ASA, Brilinta, statin. He was never placed on beta-blocker after his MI due to bradycardia.  2. Essential hypertension - BP controlled.   3. Hyperlipidemia, unspecified hyperlipidemia type - LDL in 3/18  was 58.  Continue statin.   4. Tobacco abuse - He is trying to quit.   Dispo:  Return in about 6 months (around 01/28/2017) for Routine Follow Up with Dr. Radford Pax or Richardson Dopp, PA-C .   Medication Adjustments/Labs and Tests Ordered: Current medicines are reviewed at length with the patient today.  Concerns regarding medicines are outlined above.  Orders placed this visit:  Orders Placed This Encounter  Procedures  . EKG 12-Lead   Medication changes this visit: No orders of the defined types were placed in this encounter.   Signed, Richardson Dopp, PA-C  07/28/2016 8:56 AM    [facility] [address], [place], [place]  [address] Phone: [phone]; Fax: [phone]

## 2016-07-28 ENCOUNTER — Ambulatory Visit (INDEPENDENT_AMBULATORY_CARE_PROVIDER_SITE_OTHER): Payer: 59 | Admitting: Physician Assistant

## 2016-07-28 ENCOUNTER — Telehealth: Payer: Self-pay | Admitting: *Deleted

## 2016-07-28 ENCOUNTER — Encounter: Payer: Self-pay | Admitting: Physician Assistant

## 2016-07-28 VITALS — BP 112/80 | HR 79 | Ht 71.0 in | Wt 211.8 lb

## 2016-07-28 DIAGNOSIS — E785 Hyperlipidemia, unspecified: Secondary | ICD-10-CM

## 2016-07-28 DIAGNOSIS — Z720 Tobacco use: Secondary | ICD-10-CM

## 2016-07-28 DIAGNOSIS — I10 Essential (primary) hypertension: Secondary | ICD-10-CM

## 2016-07-28 DIAGNOSIS — I2510 Atherosclerotic heart disease of native coronary artery without angina pectoris: Secondary | ICD-10-CM

## 2016-07-28 LAB — PANCREATIC ELASTASE, FECAL: PANCREATIC ELASTASE-1, STL: 211 ug/g

## 2016-07-28 NOTE — Patient Instructions (Addendum)
Medication Instructions:  No changes.   Labwork: None   Testing/Procedures: None   Follow-Up: Your physician wants you to follow-up in: [place], PAC You will receive a reminder letter in the mail two months in advance. If you don't receive a letter, please call our office to schedule the follow-up appointment.   Any Other Special Instructions Will Be Listed Below (If Applicable).  If you need a refill on your cardiac medications before your next appointment, please call your pharmacy.

## 2016-07-28 NOTE — Telephone Encounter (Signed)
Patient seen today for an office visit and was given brilinta 60 mg samples.

## 2016-08-03 DIAGNOSIS — S58119A Complete traumatic amputation at level between elbow and wrist, unspecified arm, initial encounter: Secondary | ICD-10-CM | POA: Diagnosis not present

## 2016-08-09 ENCOUNTER — Ambulatory Visit: Payer: 59 | Admitting: Cardiology

## 2016-08-26 ENCOUNTER — Other Ambulatory Visit: Payer: Self-pay

## 2016-08-26 ENCOUNTER — Ambulatory Visit (INDEPENDENT_AMBULATORY_CARE_PROVIDER_SITE_OTHER): Payer: 59 | Admitting: Gastroenterology

## 2016-08-26 ENCOUNTER — Encounter: Payer: Self-pay | Admitting: Gastroenterology

## 2016-08-26 VITALS — BP 116/80 | HR 84 | Ht 71.25 in | Wt 213.4 lb

## 2016-08-26 DIAGNOSIS — R140 Abdominal distension (gaseous): Secondary | ICD-10-CM

## 2016-08-26 DIAGNOSIS — K219 Gastro-esophageal reflux disease without esophagitis: Secondary | ICD-10-CM

## 2016-08-26 MED ORDER — DICYCLOMINE HCL 10 MG PO CAPS: 10.0000 mg | ORAL_CAPSULE | Freq: Four times a day (QID) | ORAL | 5 refills | Status: DC | PRN

## 2016-08-26 NOTE — Patient Instructions (Signed)
We have sent the following medications to your pharmacy for you to pick up at your convenience: Bentyl.  Thank you for choosing me and [facility].  Malcolm T. Stark, Jr., MD., FACG   

## 2016-08-26 NOTE — Progress Notes (Addendum)
    History of Present Illness: This is a 51 year old male with a history of GERD and esophageal stricture who complains of upper abdominal discomfort associated with abdominal bloating. Symptoms occur intermittently and no predictable pattern. Notes his stools are slightly looser than in the past but denies rectal bleeding. Daily loose stools do not correlate with upper abdominal discomfort and bloating Evaluated by PG as a new pt on 07/21/2016. Prior gastrointestinal evaluation with EGD and colonoscopy performed however pt has not supplied information for record release. Recent CBC, CMP, amylase, lipase were unremarkable. Pancreatic elastase was 211 near the lower limit of normal of > 200.  Abd [place] 07/27/2016 IMPRESSION: Fatty infiltration of the liver. No acute abnormality noted.  Current Medications, Allergies, Past Medical History, Past Surgical History, Family History and Social History were reviewed in [employer] record.  Physical Exam: General: Well developed, well nourished, no acute distress Head: Normocephalic and atraumatic Eyes:  sclerae anicteric, EOMI Ears: Normal auditory acuity Mouth: No deformity or lesions Lungs: Clear throughout to auscultation Heart: Regular rate and rhythm; no murmurs, rubs or bruits Abdomen: Soft, non tender and non distended. No masses, hepatosplenomegaly or hernias noted. Normal Bowel sounds Musculoskeletal: Symmetrical with no gross deformities  Pulses:  Normal pulses noted Extremities: Left upper extremity prosthesis. Otherwise no clubbing, cyanosis, edema or deformities noted Neurological: Alert oriented x 4, grossly nonfocal Psychological:  Alert and cooperative. Normal mood and affect  Assessment and Recommendations:  1. GERD. Continue pantoprazole 40 mg daily. Follow antireflux measures.  2. Intermittent upper abdominal bloating and discomfort. Trial of dicyclomine 10 mg 4 times a day when necessary. If not effective  trial of FDgard 1-2 to 3 times a day when necessary. Consider EGD if symptoms do not substantially improve. Will attempt to obtain records from his prior EGDs. REV in 6 weeks.   3. Slight change in bowel habits with frequent loose stools. Fecal elastase was near the lower limits of normal. Will attempt to obtain records from prior colonoscopy. REV in 6 weeks.  4. Fatty liver. Long-term weight loss program to achieve normal body weight with a low-fat carb modify diet long-term followed by his PCP.    08/29/2016 Addendum: Records received from [facility] in [place], [place] and were reviewed.  EGD 11/2011 showed a small HH and antral erythema. Gastric biopsies of mild erythema were normal. Esophageal biopsies of normal appearing mid esophageal mucosa were normal.  Colonoscopy 11/2011 showed mild diverticulosis. Colonoscopy recommended for screening in 10 years.

## 2016-10-06 ENCOUNTER — Ambulatory Visit: Payer: 59 | Admitting: Gastroenterology

## 2016-11-17 ENCOUNTER — Encounter: Payer: Self-pay | Admitting: Gastroenterology

## 2016-11-17 ENCOUNTER — Ambulatory Visit (INDEPENDENT_AMBULATORY_CARE_PROVIDER_SITE_OTHER): Payer: 59 | Admitting: Gastroenterology

## 2016-11-17 VITALS — BP 124/76 | HR 88 | Ht 71.0 in | Wt 212.0 lb

## 2016-11-17 DIAGNOSIS — K219 Gastro-esophageal reflux disease without esophagitis: Secondary | ICD-10-CM | POA: Diagnosis not present

## 2016-11-17 DIAGNOSIS — R140 Abdominal distension (gaseous): Secondary | ICD-10-CM | POA: Diagnosis not present

## 2016-11-17 NOTE — Patient Instructions (Signed)
See pulmonary regarding your C-pap machine.   Follow up as needed.   Thank you for choosing me and [facility].  Pricilla Riffle. Dagoberto Ligas., MD., Marval Regal

## 2016-11-17 NOTE — Progress Notes (Signed)
    History of Present Illness: This is a 51 year old male with GERD and bloating. Prior EGD and colonoscopy in 2013 reviewed. He relates that his abdominal bloating is often worse in the mornings following using CPAP. His reflux symptoms are under good control. I reviewed with him his EGD and colonoscopy reports from 2013 included in the addendum on my May 31st note.  Current Medications, Allergies, Past Medical History, Past Surgical History, Family History and Social History were reviewed in [employer] record.  Physical Exam: General: Well developed, well nourished, no acute distress Head: Normocephalic and atraumatic Eyes:  sclerae anicteric, EOMI Ears: Normal auditory acuity Mouth: No deformity or lesions Lungs: Clear throughout to auscultation Heart: Regular rate and rhythm; no murmurs, rubs or bruits Abdomen: Soft, non tender and non distended. No masses, hepatosplenomegaly or hernias noted. Normal Bowel sounds Musculoskeletal: Symmetrical with no gross deformities  Pulses:  Normal pulses noted Extremities: No clubbing, cyanosis, edema or deformities noted Neurological: Alert oriented x 4, grossly nonfocal Psychological:  Alert and cooperative. Normal mood and affect  Assessment and Recommendations:  1. GERD. Continue pantoprazole 40 mg daily and standard antireflux measures.  2. Abdominal bloating. Improved. Continue dicyclomine 10 mg 4 times a day as needed. Continue Gas-X 4 times a day when necessary. Often worse in the morning so suspect CPAP related. Patient has an appointment with pulmonary.

## 2016-12-10 ENCOUNTER — Ambulatory Visit: Payer: 59 | Admitting: Adult Health

## 2016-12-17 ENCOUNTER — Encounter: Payer: Self-pay | Admitting: Adult Health

## 2016-12-17 ENCOUNTER — Ambulatory Visit (INDEPENDENT_AMBULATORY_CARE_PROVIDER_SITE_OTHER): Payer: 59 | Admitting: Adult Health

## 2016-12-17 DIAGNOSIS — G4733 Obstructive sleep apnea (adult) (pediatric): Secondary | ICD-10-CM | POA: Diagnosis not present

## 2016-12-17 NOTE — Progress Notes (Signed)
@Patient  ID: Isaac Patel, male    DOB: Sep 17, 1965, 51 y.o.   MRN: [id number]  Chief Complaint  Patient presents with  . Follow-up    OSA     Referring provider: Golden Circle, FNP  HPI: 51 year old male followed for obstructive sleep apnea and nocturnal C Pap  TEST  05/2011 HST - AHI 38/h  12/17/2016 Follow up : OSA  Patient presents for follow-up for known severe sleep apnea.. Patient says he is doing well on C Pap. He gets in about 5 hours each night. He denies he significant daytime sleepiness. Does feel pressure is too high at times and does not like his mask.  Download shows excellent compliance with avg usage at 6.5hr. On auto set 6 to [place].      Allergies  Allergen Reactions  . Morphine And Related Itching    Immunization History  Administered Date(s) Administered  . Influenza-Unspecified 01/06/2015    Past Medical History:  Diagnosis Date  . CAD (coronary artery disease)    a.  Non-STEMI 8/16: LHC-proximal LAD 80% treated with a resolute DES, EF 50-55%;  b.  Echo 8/16:  Moderate LVH, EF 55-60%, normal wall motion, normal diastolic function, normal RV function  . Depression    hx  . GERD (gastroesophageal reflux disease)   . Headache    "maybe monthly" (10/28/2014)  . HLD (hyperlipidemia)   . NSTEMI (non-ST elevated myocardial infarction) ([place]) 10/28/2014  . OSA (obstructive sleep apnea)    "tried mask; wear it off and on" (10/28/2014)    Tobacco History: History  Smoking Status  . Current Some Day Smoker  . Packs/day: 0.33  . Years: 32.00  . Types: Cigarettes  Smokeless Tobacco  . Never Used   Ready to quit: No Counseling given: Not Answered   Outpatient Encounter Prescriptions as of 12/17/2016  Medication Sig  . aspirin 81 MG tablet Take 1 tablet (81 mg total) by mouth daily.  Marland Kitchen atorvastatin (LIPITOR) 80 MG tablet Take 1 tablet (80 mg total) by mouth daily at 6 PM.  . dicyclomine (BENTYL) 10 MG capsule Take 1 capsule (10 mg total) by  mouth 4 (four) times daily as needed for spasms. For abdominal pain and bloating  . fluticasone (FLONASE) 50 MCG/ACT nasal spray Place 2 sprays into both nostrils daily as needed for allergies or rhinitis.   Marland Kitchen lisinopril (PRINIVIL,ZESTRIL) 10 MG tablet Take 1 tablet (10 mg total) by mouth daily.  Marland Kitchen loratadine (CLARITIN) 10 MG tablet Take 10 mg by mouth daily.  . Multiple Vitamin (MULTI-VITAMIN PO) Take 1 tablet by mouth daily.  . nitroGLYCERIN (NITROSTAT) 0.4 MG SL tablet Place 1 tablet (0.4 mg total) under the tongue every 5 (five) minutes x 3 doses as needed for chest pain.  Marland Kitchen nystatin ointment (MYCOSTATIN) Apply 1 application topically daily as needed (arm rash).  . pantoprazole (PROTONIX) 40 MG tablet Take 1 tablet (40 mg total) by mouth daily.  . ticagrelor (BRILINTA) 60 MG TABS tablet Take 1 tablet (60 mg total) by mouth 2 (two) times daily.   No facility-administered encounter medications on file as of 12/17/2016.      Review of Systems  Constitutional:   No  weight loss, night sweats,  Fevers, chills, fatigue, or  lassitude.  HEENT:   No headaches,  Difficulty swallowing,  Tooth/dental problems, or  Sore throat,                No sneezing, itching, ear ache, nasal congestion, post nasal  drip,   CV:  No chest pain,  Orthopnea, PND, swelling in lower extremities, anasarca, dizziness, palpitations, syncope.   GI  No heartburn, indigestion, abdominal pain, nausea, vomiting, diarrhea, change in bowel habits, loss of appetite, bloody stools.   Resp: No shortness of breath with exertion or at rest.  No excess mucus, no productive cough,  No non-productive cough,  No coughing up of blood.  No change in color of mucus.  No wheezing.  No chest wall deformity  Skin: no rash or lesions.  GU: no dysuria, change in color of urine, no urgency or frequency.  No flank pain, no hematuria   MS:  No joint pain or swelling.  No decreased range of motion.  No back pain.    Physical Exam  BP  112/70 (BP Location: Right Arm, Cuff Size: Normal)   Pulse 68   Ht 5\' 11"  (1.803 m)   Wt 212 lb (96.2 kg)   SpO2 97%   BMI 29.57 kg/m   GEN: A/Ox3; pleasant , NAD    HEENT:  [place]/AT,  EACs-clear, TMs-wnl, NOSE-clear, THROAT-clear, no lesions, no postnasal drip or exudate noted. Class 2-3 MP airway   NECK:  Supple w/ fair ROM; no JVD; normal carotid impulses w/o bruits; no thyromegaly or nodules palpated; no lymphadenopathy.    RESP  Clear  P & A; w/o, wheezes/ rales/ or rhonchi. no accessory muscle use, no dullness to percussion  CARD:  RRR, no m/r/g, no peripheral edema, pulses intact, no cyanosis or clubbing.  GI:   Soft & nt; nml bowel sounds; no organomegaly or masses detected.   Musco: Warm bil, no deformities or joint swelling noted. Left arm prosthesis .   Neuro: alert, no focal deficits noted.    Skin: Warm, no lesions or rashes    Lab Results:  CBC  ProBNP No results found for: PROBNP  Imaging: No results found.   Assessment & Plan:   OSA (obstructive sleep apnea) Excellent compliance with improved control - still has some residual events.  Will change to dream wear gel mask for comfort ,  Change auto set 5-20  Download in 6 weeks   Plan  Patient Instructions  Continue on CPAP At bedtime   Order for new CPAP machine Change auto CPAP 5-15cmH20.  Change to dream wear full face mask.  Wear for at least 4-6 hr each night .  Do not drive if sleepy.  Follow up with Dr. Elsworth Soho  In 6 months and As needed         Rexene Edison, NP 12/17/2016

## 2016-12-17 NOTE — Assessment & Plan Note (Signed)
Excellent compliance with improved control - still has some residual events.  Will change to dream wear gel mask for comfort ,  Change auto set 5-20  Download in 6 weeks   Plan  Patient Instructions  Continue on CPAP At bedtime   Order for new CPAP machine Change auto CPAP 5-15cmH20.  Change to dream wear full face mask.  Wear for at least 4-6 hr each night .  Do not drive if sleepy.  Follow up with Dr. Elsworth Soho  In 6 months and As needed

## 2016-12-17 NOTE — Addendum Note (Signed)
Addended by: Parke Poisson E on: 12/17/2016 05:25 PM   Modules accepted: Orders

## 2016-12-17 NOTE — Patient Instructions (Addendum)
Continue on CPAP At bedtime   Order for new CPAP machine Change auto CPAP 5-15cmH20.  Change to dream wear full face mask.  Wear for at least 4-6 hr each night .  Do not drive if sleepy.  Follow up with Dr. Elsworth Soho  In 6 months and As needed

## 2016-12-29 ENCOUNTER — Encounter: Payer: Self-pay | Admitting: Adult Health

## 2016-12-29 NOTE — Telephone Encounter (Signed)
Hi Isaac Patel,   [employer] reached out to me and they claim they reached out to you as well. They are asking for a signed sleep study certification. I am willing to go to any length to help out. if we need to do a new sleep study then lets do it. If the old sleep study works, then great.   hope you have a great day   [name]  PCC's please help with this per protocol, thanks

## 2016-12-29 NOTE — Telephone Encounter (Signed)
I have printed his sleep study from 2013 not sure if this will be sufficient but it has been sent.

## 2016-12-31 ENCOUNTER — Ambulatory Visit (INDEPENDENT_AMBULATORY_CARE_PROVIDER_SITE_OTHER): Payer: 59 | Admitting: Physician Assistant

## 2016-12-31 ENCOUNTER — Encounter: Payer: Self-pay | Admitting: Physician Assistant

## 2016-12-31 VITALS — BP 118/80 | HR 80 | Ht 71.0 in | Wt 211.8 lb

## 2016-12-31 DIAGNOSIS — I2510 Atherosclerotic heart disease of native coronary artery without angina pectoris: Secondary | ICD-10-CM | POA: Diagnosis not present

## 2016-12-31 DIAGNOSIS — I10 Essential (primary) hypertension: Secondary | ICD-10-CM

## 2016-12-31 DIAGNOSIS — G4733 Obstructive sleep apnea (adult) (pediatric): Secondary | ICD-10-CM | POA: Diagnosis not present

## 2016-12-31 MED ORDER — ATORVASTATIN CALCIUM 80 MG PO TABS: 80.0000 mg | ORAL_TABLET | Freq: Every day | ORAL | 3 refills | Status: DC

## 2016-12-31 MED ORDER — LISINOPRIL 10 MG PO TABS: 10.0000 mg | ORAL_TABLET | Freq: Every day | ORAL | 3 refills | Status: DC

## 2016-12-31 MED ORDER — CLOPIDOGREL BISULFATE 75 MG PO TABS: 75.0000 mg | ORAL_TABLET | Freq: Every day | ORAL | 3 refills | Status: DC

## 2016-12-31 NOTE — Patient Instructions (Signed)
Medication Instructions:  Isaac Patel your current prescription for Brilinta.   Start Plavix 75 mg Once daily once you have stopped Brilinta.  Labwork: None   Testing/Procedures: None   Follow-Up: Dr. [name] or Richardson Dopp, PA-C in 1 year.   Any Other Special Instructions Will Be Listed Below (If Applicable). A refill has been sent to your pharmacy for Lipitor and Lisinopril.  A prescription for Plavix has been sent as well.   If you need a refill on your cardiac medications before your next appointment, please call your pharmacy.

## 2016-12-31 NOTE — Progress Notes (Signed)
Cardiology Office Note:    Date:  12/31/2016   ID:  Isaac Patel, DOB [birth year], MRN [id number]  PCP:  Golden Circle, FNP  Cardiologist:  Dr. [name]    Referring MD: Golden Circle, FNP   Chief Complaint  Patient presents with  . Coronary Artery Disease    follow up    History of Present Illness:    Isaac Patel is a 51 y.o. male with a hx of CAD s/p NSTEMI in 8/16 tx with DES to the proximal LAD. He was followed as part of the TWILIGHT protocol and came off study drug 02/04/16. He was then transitioned to Brilinta 60 mg bid and ASA 81 mg QD.  He was admitted in 2/18 with chest pain and LHC demonstrated a patent LAD stent.  Med Rx was continued.  He was then seen in the ED 07/14/16 with chest pain.  Troponin levels were neg.  CXR was unremarkable. EKG demonstrated no acute changes.  He was last seen in 07/2016.    Isaac Patel returns for follow up.  He is doing well.  He has not had chest pain, shortness of breath, syncope, edema.    Prior CV studies:   The following studies were reviewed today:  LHC 05/25/16 LAD prox stent ok LCx irregs RCA normal EF 55  Echo 10/30/14 Moderate LVH, EF 55-60%, normal wall motion, normal diastolic function, normal RV function  LHC 10/29/14 LAD: Proximal-mid 80% EF 50-55% with small focal region of mild mid anterolateral hypokinesis PCI: 3 x 15 mm resolute DES to the LAD  Past Medical History:  Diagnosis Date  . CAD (coronary artery disease)    a.  Non-STEMI 8/16: LHC-proximal LAD 80% treated with a resolute DES, EF 50-55%;  b.  Echo 8/16:  Moderate LVH, EF 55-60%, normal wall motion, normal diastolic function, normal RV function  . Depression    hx  . GERD (gastroesophageal reflux disease)   . Headache    "maybe monthly" (10/28/2014)  . HLD (hyperlipidemia)   . NSTEMI (non-ST elevated myocardial infarction) ([place]) 10/28/2014  . OSA (obstructive sleep apnea)    "tried mask; wear it off and on" (10/28/2014)    Past  Surgical History:  Procedure Laterality Date  . 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 CARDIAC CATHETERIZATION N/A 10/29/2014   Procedure: Left Heart Cath and Coronary Angiography;  Surgeon: Troy Sine, MD;  Location: [facility];  Service: Cardiovascular;  Laterality: N/A;  . CHOLESTEATOMA EXCISION Right 1990's  . LEFT HEART CATH AND CORONARY ANGIOGRAPHY N/A 05/25/2016   Procedure: Left Heart Cath and Coronary Angiography;  Surgeon: Sherren Mocha, MD;  Location: [facility];  Service: Cardiovascular;  Laterality: N/A;  . TYMPANOSTOMY TUBE PLACEMENT Bilateral "as a kid"   "had one done as an adult too"    Current Medications: Current Meds  Medication Sig  . aspirin 81 MG tablet Take 1 tablet (81 mg total) by mouth daily.  Marland Kitchen atorvastatin (LIPITOR) 80 MG tablet Take 1 tablet (80 mg total) by mouth daily at 6 PM.  . dicyclomine (BENTYL) 10 MG capsule Take 1 capsule (10 mg total) by mouth 4 (four) times daily as needed for spasms. For abdominal pain and bloating  . fluticasone (FLONASE) 50 MCG/ACT nasal spray Place 2 sprays into both nostrils daily as needed for allergies or rhinitis.   Marland Kitchen lisinopril (PRINIVIL,ZESTRIL) 10 MG tablet Take 1 tablet (10 mg total) by mouth daily.  Marland Kitchen  loratadine (CLARITIN) 10 MG tablet Take 10 mg by mouth daily.  . Multiple Vitamin (MULTI-VITAMIN PO) Take 1 tablet by mouth daily.  . nitroGLYCERIN (NITROSTAT) 0.4 MG SL tablet Place 1 tablet (0.4 mg total) under the tongue every 5 (five) minutes x 3 doses as needed for chest pain.  Marland Kitchen nystatin ointment (MYCOSTATIN) Apply 1 application topically daily as needed (arm rash).  . pantoprazole (PROTONIX) 40 MG tablet Take 1 tablet (40 mg total) by mouth daily.  . [DISCONTINUED] ticagrelor (BRILINTA) 60 MG TABS tablet Take 1 tablet (60 mg total) by mouth 2 (two) times daily.     Allergies:   Morphine and related   Social History   Social History  . Marital status: Married     Spouse name: N/A  . Number of children: 2  . Years of education: 14   Occupational History  . CMA    Social History Main Topics  . Smoking status: Current Some Day Smoker    Packs/day: 0.33    Years: 32.00    Types: Cigarettes  . Smokeless tobacco: Never Used  . Alcohol use 1.2 - 1.8 oz/week    2 - 3 Standard drinks or equivalent per week     Comment: stopped 06/19/2015  . Drug use: Yes    Types: Marijuana     Comment: 05/24/2016  "stopped marijuana in the early 2000's"  . Sexual activity: Yes   Other Topics Concern  . None   Social History Narrative   Born and raised in [place], [place].  Currently resides in a house with his wife and children. 1 dog. Fun: play computer games, sports.    Denies any religious beliefs effecting health care.    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 There is no history of Colon cancer, Stomach cancer, Rectal cancer, Liver cancer, or Esophageal cancer.  ROS:   Please see the history of present illness.    ROS All other systems reviewed and are negative.   EKGs/Labs/Other Test Reviewed:    EKG:  EKG is not ordered today.  The ekg ordered today demonstrates n/a  Recent Labs: 06/09/2016: ALT 28 07/14/2016: BUN 15; Creatinine, Ser 1.14; Hemoglobin 15.2; Platelets 212; Potassium 3.9; Sodium 139   Recent Lipid Panel Lab Results  Component Value Date/Time   CHOL 113 06/09/2016 07:33 AM   TRIG 88.0 06/09/2016 07:33 AM   HDL 36.80 (L) 06/09/2016 07:33 AM   CHOLHDL 3 06/09/2016 07:33 AM   LDLCALC 58 06/09/2016 07:33 AM    Physical Exam:    VS:  BP 118/80   Pulse 80   Ht 5\' 11"  (1.803 m)   Wt 211 lb 12.8 oz (96.1 kg)   SpO2 97%   BMI 29.54 kg/m     Wt Readings from Last 3 Encounters:  12/31/16 211 lb 12.8 oz (96.1 kg)  12/17/16 212 lb (96.2  kg)  11/17/16 212 lb (96.2 kg)     Physical Exam  Constitutional: He is oriented to person, place, and time. He appears well-developed and well-nourished. No distress.  HENT:  Head: Normocephalic and atraumatic.  Neck: No JVD present.  Cardiovascular: Normal rate and regular rhythm.   No murmur heard. Pulmonary/Chest: Effort normal. He has no rales.  Abdominal: Soft.  Musculoskeletal: He exhibits no  edema.  Neurological: He is alert and oriented to person, place, and time.  Skin: Skin is warm and dry.  Psychiatric: He has a normal mood and affect.    ASSESSMENT:    1. Coronary artery disease involving native coronary artery of native heart without angina pectoris   2. Essential hypertension    PLAN:    In order of problems listed above:  1. Coronary artery disease involving native coronary artery of native heart without angina pectoris s/p NSTEMI in 2016 tx with DES to the LAD.  LHC In 2/18 with patent stent in the LAD and no significant disease elsewhere.   He has not had any angina.  The Brilinta is somewhat expensive. He is > 2 years out from his MI.  I think it is ok for him to switch to Plavix.    -  DC Brilinta  -  Start Plavix 75 mg Once daily   -  Continue ASA, Lipitor  2. Essential hypertension The patient's blood pressure is controlled on his current regimen.  Continue current therapy.    Dispo:  Return in about 1 year (around 12/31/2017) for Routine Follow Up w/ Dr. Radford Pax or Richardson Dopp, PA-C .   Medication Adjustments/Labs and Tests Ordered: Current medicines are reviewed at length with the patient today.  Concerns regarding medicines are outlined above.  Tests Ordered: No orders of the defined types were placed in this encounter.  Medication Changes: Meds ordered this encounter  Medications  . clopidogrel (PLAVIX) 75 MG tablet    Sig: Take 1 tablet (75 mg total) by mouth daily.    Dispense:  90 tablet    Refill:  3    Order Specific Question:    Supervising Provider    Answer:   END, CHRISTOPHER [[id number]]  . lisinopril (PRINIVIL,ZESTRIL) 10 MG tablet    Sig: Take 1 tablet (10 mg total) by mouth daily.    Dispense:  90 tablet    Refill:  3    Order Specific Question:   Supervising Provider    Answer:   END, CHRISTOPHER [[id number]]  . atorvastatin (LIPITOR) 80 MG tablet    Sig: Take 1 tablet (80 mg total) by mouth daily at 6 PM.    Dispense:  90 tablet    Refill:  3    Order Specific Question:   Supervising Provider    Answer:   END, CHRISTOPHER [[id number]]    Signed, Richardson Dopp, PA-C  12/31/2016 12:35 PM    [facility] [place], [place], [place]  [address] Phone: [phone]; Fax: [phone]

## 2017-01-31 DIAGNOSIS — G4733 Obstructive sleep apnea (adult) (pediatric): Secondary | ICD-10-CM | POA: Diagnosis not present

## 2017-03-02 DIAGNOSIS — G4733 Obstructive sleep apnea (adult) (pediatric): Secondary | ICD-10-CM | POA: Diagnosis not present

## 2017-03-08 ENCOUNTER — Ambulatory Visit: Payer: 59 | Admitting: Internal Medicine

## 2017-03-10 ENCOUNTER — Encounter: Payer: Self-pay | Admitting: Family Medicine

## 2017-03-10 ENCOUNTER — Ambulatory Visit (INDEPENDENT_AMBULATORY_CARE_PROVIDER_SITE_OTHER): Payer: 59 | Admitting: Family Medicine

## 2017-03-10 VITALS — BP 136/72 | HR 66 | Temp 98.2°F | Ht 71.0 in | Wt 211.0 lb

## 2017-03-10 DIAGNOSIS — M25522 Pain in left elbow: Secondary | ICD-10-CM

## 2017-03-10 NOTE — Patient Instructions (Signed)
Thank you for coming in,   Please try getting the prosthesis fitted.   Please follow up with me if this becomes inflamed again and I can inject it.    Please feel free to call with any questions or concerns at any time, at [phone]. --Dr. Raeford Razor

## 2017-03-10 NOTE — Progress Notes (Signed)
Isaac Patel - 51 y.o. male MRN [id number]  Date of birth: 11/09/1965  SUBJECTIVE:  Including CC & ROS.  Chief Complaint  Patient presents with  . Left arm stump mass   Isaac Patel  is a 52 y.o. male that is here for an evaluation of a mass on his left arm stump. He noticed this mass about 2 months.  It is located on the mediall aspect of his stump. He states it was painful and tender  when he put his prosthetic on. 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 The pain was localized to this area. The pain is moderate to severe in nature. Today the pain has improved and is mild. It is sharp in nature. His amputation occurred in 2007 after an industrial accident. This occurred in [place]. He has not had any further surgeries on the arm. He has gained about 20 pounds since he was fitted for his prosthesis. Has not had any injection therapy.   Review of Systems  Constitutional: Negative for fever.  Musculoskeletal: Negative for joint swelling.  Skin: Negative for color change.  Neurological: Negative for weakness and numbness.  Hematological: Negative for adenopathy.  Psychiatric/Behavioral: Negative for agitation.    HISTORY: Past Medical, Surgical, Social, and Family History Reviewed & Updated per EMR.   Pertinent Historical Findings include:  Past Medical History:  Diagnosis Date  . CAD (coronary artery disease)    a.  Non-STEMI 8/16: LHC-proximal LAD 80% treated with a resolute DES, EF 50-55%;  b.  Echo 8/16:  Moderate LVH, EF 55-60%, normal wall motion, normal diastolic function, normal RV function  . Depression    hx  . GERD (gastroesophageal reflux disease)   . Headache    "maybe monthly" (10/28/2014)  . HLD (hyperlipidemia)   . NSTEMI (non-ST elevated myocardial infarction) ([place]) 10/28/2014  . OSA (obstructive sleep apnea)    "tried mask; wear it off and on" (10/28/2014)    Past Surgical History:  Procedure Laterality Date  . ARM  AMPUTATION THROUGH FOREARM Left 2009   traumatic injury  . CARDIAC CATHETERIZATION N/A 10/29/2014   Procedure: Left Heart Cath and Coronary Angiography;  Surgeon: Troy Sine, MD;  Location: [facility];  Service: Cardiovascular;  Laterality: N/A;  . CHOLESTEATOMA EXCISION Right 1990's  . LEFT HEART CATH AND CORONARY ANGIOGRAPHY N/A 05/25/2016   Procedure: Left Heart Cath and Coronary Angiography;  Surgeon: Sherren Mocha, MD;  Location: [facility];  Service: Cardiovascular;  Laterality: N/A;  . TYMPANOSTOMY TUBE PLACEMENT Bilateral "as a kid"   "had one done as an adult too"    Allergies  Allergen Reactions  . Morphine And Related Itching    Family History  Problem Relation Age of Onset  . Diverticulitis Mother   . Healthy Father   . Multiple sclerosis Maternal Grandfather   . Colon cancer Neg Hx   . Stomach cancer Neg Hx   . Rectal cancer Neg Hx   . Liver cancer Neg Hx   . Esophageal cancer Neg Hx      Social History   Socioeconomic History  . Marital status: Married    Spouse name: Not on file  . Number of children: 2  . Years of education: 73  . Highest education level: Not on file  Social Needs  . Financial resource strain: Not on file  . Food insecurity - worry: Not on file  . Food insecurity - inability:  Not on file  . Transportation needs - medical: Not on file  . Transportation needs - non-medical: Not on file  Occupational History  . Occupation: CMA  Tobacco Use  . Smoking status: Current Some Day Smoker    Packs/day: 0.33    Years: 32.00    Pack years: 10.56    Types: Cigarettes  . Smokeless tobacco: Never Used  Substance and Sexual Activity  . Alcohol use: Yes    Alcohol/week: 1.2 - 1.8 oz    Types: 2 - 3 Standard drinks or equivalent per week    Comment: stopped 06/19/2015  . Drug use: Yes    Types: Marijuana    Comment: 05/24/2016  "stopped marijuana in the early 2000's"  . Sexual activity: Yes  Other Topics Concern  . Not on  file  Social History Narrative   Born and raised in [place], [place].  Currently resides in a house with his wife and children. 1 dog. Fun: play computer games, sports.    Denies any religious beliefs effecting health care.    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 [address]     PHYSICAL EXAM:  VS: BP 136/72 (BP Location: Right Arm, Patient Position: Sitting, Cuff Size: Normal)   Pulse 66   Temp 98.2 F (36.8 C) (Oral)   Ht 5\' 11"  (1.803 m)   Wt 211 lb (95.7 kg)   SpO2 99%   BMI 29.43 kg/m  Physical Exam Gen: NAD, alert, cooperative with exam, well-appearing ENT: normal lips, normal nasal mucosa,  Eye: normal EOM, normal conjunctiva and lids CV:  no edema, +2 pedal pulses   Resp: no accessory muscle use, non-labored,  GI: no masses or tenderness, no hernia  Skin: no rashes, no areas of induration  Neuro: normal tone, normal sensation to touch Psych:  normal insight, alert and oriented MSK:  Left arm:  Amputation below the elbow. Medial epicondyle still present but no tenderness to palpation. Palpable mass just superficial to the medial epicondyle that is slightly tender in nature. Skin darkening it appears to be rubbing in this area. Normal flexion and extension. Normal sensation Neurovascular intact  Limited ultrasound: Left elbow:  Hypoechoic changes to suggest a mass suggesting a neuroma  Summary: Findings suggestive of a neuroma  Ultrasound and interpretation by Clearance Coots, MD         ASSESSMENT & PLAN:   Left elbow pain Pain likely result from a neuroma. His prosthesis has not been fitted in some time.  - try to re-fit his prosthesis.  - if worsens then would consider an injection or could consider PT.

## 2017-03-10 NOTE — Assessment & Plan Note (Signed)
Pain likely result from a neuroma. His prosthesis has not been fitted in some time.  - try to re-fit his prosthesis.  - if worsens then would consider an injection or could consider PT.

## 2017-04-02 DIAGNOSIS — G4733 Obstructive sleep apnea (adult) (pediatric): Secondary | ICD-10-CM | POA: Diagnosis not present

## 2017-04-06 ENCOUNTER — Encounter: Payer: Self-pay | Admitting: Nurse Practitioner

## 2017-04-06 ENCOUNTER — Other Ambulatory Visit: Payer: Self-pay | Admitting: *Deleted

## 2017-04-06 MED ORDER — PANTOPRAZOLE SODIUM 40 MG PO TBEC: 40.0000 mg | DELAYED_RELEASE_TABLET | Freq: Every day | ORAL | 0 refills | Status: DC

## 2017-04-08 DIAGNOSIS — G4733 Obstructive sleep apnea (adult) (pediatric): Secondary | ICD-10-CM | POA: Diagnosis not present

## 2017-05-08 ENCOUNTER — Encounter (HOSPITAL_COMMUNITY): Payer: Self-pay | Admitting: *Deleted

## 2017-05-08 ENCOUNTER — Ambulatory Visit (INDEPENDENT_AMBULATORY_CARE_PROVIDER_SITE_OTHER): Payer: 59

## 2017-05-08 ENCOUNTER — Ambulatory Visit (HOSPITAL_COMMUNITY)
Admission: EM | Admit: 2017-05-08 | Discharge: 2017-05-08 | Disposition: A | Discharge status: Home / Self Care | Payer: 59 | Attending: Internal Medicine | Admitting: Internal Medicine

## 2017-05-08 ENCOUNTER — Other Ambulatory Visit: Payer: Self-pay

## 2017-05-08 DIAGNOSIS — S92414A Nondisplaced fracture of proximal phalanx of right great toe, initial encounter for closed fracture: Secondary | ICD-10-CM

## 2017-05-08 DIAGNOSIS — S92411A Displaced fracture of proximal phalanx of right great toe, initial encounter for closed fracture: Secondary | ICD-10-CM | POA: Diagnosis not present

## 2017-05-08 DIAGNOSIS — S92421A Displaced fracture of distal phalanx of right great toe, initial encounter for closed fracture: Secondary | ICD-10-CM | POA: Diagnosis not present

## 2017-05-08 MED ORDER — HYDROCODONE-ACETAMINOPHEN 5-325 MG PO TABS: 1.0000 | ORAL_TABLET | ORAL | 0 refills | Status: DC | PRN

## 2017-05-08 NOTE — ED Provider Notes (Signed)
[place]    CSN: [id number] Arrival date & time: 05/08/17  1212     History   Chief Complaint Chief Complaint  Patient presents with  . Fall    HPI Emilliano Dilworth is a 52 y.o. male.   [name] presents with complaints of right great toe pain after he slipped and fell on his hard wood floor while wearing socks, jumped up to answer the door, causing his foot to kick out and strike the leg of his table. Had immediate pain. Pain continues to increased in severity, kept him up last night. Pain is 6/10. Worse with weight bearing or anything touching the toe. Took tylenol last night which did not help with pain. Denies any previous toe injury. Without numbness or tingling.    ROS per HPI.       Past Medical History:  Diagnosis Date  . CAD (coronary artery disease)    a.  Non-STEMI 8/16: LHC-proximal LAD 80% treated with a resolute DES, EF 50-55%;  b.  Echo 8/16:  Moderate LVH, EF 55-60%, normal wall motion, normal diastolic function, normal RV function  . Depression    hx  . GERD (gastroesophageal reflux disease)   . Headache    "maybe monthly" (10/28/2014)  . HLD (hyperlipidemia)   . NSTEMI (non-ST elevated myocardial infarction) ([place]) 10/28/2014  . OSA (obstructive sleep apnea)    "tried mask; wear it off and on" (10/28/2014)    Patient Active Problem List   Diagnosis Date Noted  . Left elbow pain 03/10/2017  . GERD with esophagitis 06/08/2016  . Essential hypertension 06/08/2016  . Chest pain with moderate risk for cardiac etiology 05/24/2016  . 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 Anxiety state 11/15/2014  . CAD (coronary artery disease) 10/30/2014  . Tobacco abuse 10/30/2014  . HLD (hyperlipidemia) 10/30/2014  . History of non-ST elevation myocardial infarction (NSTEMI) 10/28/2014  . Routine general medical examination at a health care facility 05/29/2014  . OSA (obstructive sleep apnea) 05/08/2014  . 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 CARDIAC CATHETERIZATION N/A 10/29/2014   Procedure: Left Heart Cath and Coronary Angiography;  Surgeon: Troy Sine, MD;  Location: [place] [facility];  Service: Cardiovascular;  Laterality: N/A;  . CHOLESTEATOMA EXCISION Right 1990's  . LEFT HEART CATH AND CORONARY ANGIOGRAPHY N/A 05/25/2016   Procedure: Left Heart Cath and Coronary Angiography;  Surgeon: Sherren Mocha, MD;  Location: [place] [facility];  Service: Cardiovascular;  Laterality: N/A;  . TYMPANOSTOMY TUBE PLACEMENT Bilateral "as a kid"   "had one done as an adult too"       Home Medications    Prior to Admission medications   Medication Sig Start Date End Date Taking? Authorizing Provider  aspirin 81 MG tablet Take 1 tablet (81 mg total) by mouth daily. 02/03/16  Yes Turner, Eber Hong, MD  atorvastatin (LIPITOR) 80 MG tablet Take 1 tablet (80 mg total) by mouth daily at 6 PM. 12/31/16  Yes Weaver, Nicki Reaper T, PA-C  clopidogrel (PLAVIX) 75 MG tablet Take 1 tablet (75 mg total) by mouth daily. 12/31/16 12/31/17 Yes Weaver, Scott T, PA-C  dicyclomine (BENTYL) 10 MG capsule Take 1 capsule (10 mg total) by mouth 4 (four) times daily as needed for spasms. For abdominal pain and bloating 08/26/16  Yes Ladene Artist, MD  fluticasone [facility]) 50  MCG/ACT nasal spray Place 2 sprays into both nostrils daily as needed for allergies or rhinitis.    Yes [provider]  lisinopril (PRINIVIL,ZESTRIL) 10 MG tablet Take 1 tablet (10 mg total) by mouth daily. 12/31/16  Yes Weaver, Scott T, PA-C  loratadine (CLARITIN) 10 MG tablet Take 10 mg by mouth daily.   Yes [provider]  Multiple Vitamin (MULTI-VITAMIN PO) Take 1 tablet by mouth daily.   Yes [provider]  nitroGLYCERIN (NITROSTAT) 0.4 MG SL tablet Place 1 tablet (0.4 mg total) under the tongue every 5 (five) minutes x 3 doses as needed for  chest pain. 10/30/14  Yes Brett Canales, PA-C  nystatin ointment (MYCOSTATIN) Apply 1 application topically daily as needed (arm rash).   Yes [provider]  pantoprazole (PROTONIX) 40 MG tablet Take 1 tablet (40 mg total) by mouth daily. Must keep appt w/new provider for future refills 04/06/17  Yes [name], NP    Family History Family History  Problem Relation Age of Onset  . Diverticulitis Mother   . Skin cancer Mother   . Healthy Father   . Bradycardia Father   . Multiple sclerosis Maternal Grandfather   . Colon cancer Neg Hx   . Stomach cancer Neg Hx   . Rectal cancer Neg Hx   . Liver cancer Neg Hx   . Esophageal cancer Neg Hx     Social History Social History   Tobacco Use  . Smoking status: Current Some Day Smoker    Packs/day: 0.33    Years: 32.00    Pack years: 10.56    Types: Cigarettes  . Smokeless tobacco: Never Used  Substance Use Topics  . Alcohol use: No    Alcohol/week: 1.2 - 1.8 oz    Types: 2 - 3 Standard drinks or equivalent per week    Frequency: Never    Comment: stopped 06/19/2015,   . Drug use: Yes    Types: Marijuana    Comment: 05/24/2016  "stopped marijuana in the early 2000's"     Allergies   Morphine and related   Review of Systems Review of Systems   Physical Exam Triage Vital Signs ED Triage Vitals  Enc Vitals Group     BP 05/08/17 1400 122/86     Pulse Rate 05/08/17 1400 (!) 111     Resp --      Temp 05/08/17 1400 98 F (36.7 C)     Temp Source 05/08/17 1400 Oral     SpO2 05/08/17 1400 96 %     Weight --      Height --      Head Circumference --      Peak Flow --      Pain Score 05/08/17 1355 6     Pain Loc --      Pain Edu? --      Excl. in [place]? --    No data found.  Updated Vital Signs BP 122/86 (BP Location: Right Arm)   Pulse (!) 111   Temp 98 F (36.7 C) (Oral)   SpO2 96%   Visual Acuity Right Eye Distance:   Left Eye Distance:   Bilateral Distance:    Right Eye Near:   Left Eye  Near:    Bilateral Near:     Physical Exam  Constitutional: He is oriented to person, place, and time. He appears well-developed and well-nourished.  Cardiovascular: Regular rhythm. Tachycardia present.  Pulmonary/Chest: Effort normal and breath sounds  normal.  Musculoskeletal:       Right foot: There is decreased range of motion, tenderness, bony tenderness and swelling. There is normal capillary refill, no crepitus, no deformity and no laceration.       Feet:  Bruising to first great toe with tenderness to DIP and to proximal phalanx; without increased with passive rom to DIP and MTP but patient unable to flex or extend at these joints independently; mild tenderness to lateral right foot along fifth metatarsal, without bruising; gross sensation intact; strong pedal pulse; cap refill < 2 seconds  Neurological: He is alert and oriented to person, place, and time.  Skin: Skin is warm and dry.     UC Treatments / Results  Labs (all labs ordered are listed, but only abnormal results are displayed) Labs Reviewed - No data to display  EKG  EKG Interpretation None       Radiology Dg Foot Complete Right  Result Date: 05/08/2017 CLINICAL DATA:  Pain following fall EXAM: RIGHT FOOT COMPLETE - 3+ VIEW COMPARISON:  None. FINDINGS: Frontal, oblique, and lateral views were obtained. There is a comminuted fracture involving the mid and distal aspects of the first proximal phalanx. There is slight dorsal angulation of the distal major fracture fragment with respect to the major proximal fragment. No other fractures are evident. No dislocation. Joint spaces appear normal. Note that there is a degree of soft tissue swelling dorsally. IMPRESSION: Comminuted fracture involving the mid the distal aspects of the first proximal phalanx. No other fractures. No dislocation. No appreciable arthropathy. Soft tissue swelling noted dorsally. Electronically Signed   By: Lowella Grip III M.D.   On: 05/08/2017  14:19    Procedures Procedures (including critical care time)  Medications Ordered in UC Medications - No data to display   Initial Impression / Assessment and Plan / UC Course  I have reviewed the triage vital signs and the nursing notes.  Pertinent labs & imaging results that were available during my care of the patient were reviewed by me and considered in my medical decision making (see chart for details).     Page to on call ortho Dr. Doreatha Martin. Recommended post op shoe, weight bearing as tolerated. Follow up in the next 1-2 weeks.   Ice, elevate, norco for pain control. Patient verbalized understanding and agreeable to plan.    Final Clinical Impressions(s) / UC Diagnoses   Final diagnoses:  Displaced fracture of proximal phalanx of right great toe, initial encounter for closed fracture    ED Discharge Orders    None       Controlled Substance Prescriptions [place] Controlled Substance Registry consulted? Not Applicable   Zigmund Gottron, NP 05/08/17 1536

## 2017-05-08 NOTE — ED Triage Notes (Signed)
Right big toe hurt, per pt he thinks his big toe broke

## 2017-05-08 NOTE — Discharge Instructions (Signed)
Ice and elevation Weight bearing as tolerated. Use of post op shoe as provided or solid soled shoe. Hydrocodone as needed for pain, do not drive with this, may cause drowsiness.

## 2017-05-08 NOTE — Consult Note (Signed)
I was asked to review images on Isaac Patel. It reveals an intra-articular proximal phalanx fracture of the right great toe, minimally displaced. Recommend WBAT in hard sole shoe and follow up with me this week or next.  Shona Needles, MD [facility] [phone] (phone)

## 2017-05-09 ENCOUNTER — Encounter (INDEPENDENT_AMBULATORY_CARE_PROVIDER_SITE_OTHER): Payer: Self-pay | Admitting: Physician Assistant

## 2017-05-09 ENCOUNTER — Ambulatory Visit (INDEPENDENT_AMBULATORY_CARE_PROVIDER_SITE_OTHER): Payer: 59 | Admitting: Physician Assistant

## 2017-05-09 DIAGNOSIS — S92411A Displaced fracture of proximal phalanx of right great toe, initial encounter for closed fracture: Secondary | ICD-10-CM | POA: Diagnosis not present

## 2017-05-09 NOTE — Progress Notes (Signed)
Office Visit Note   Patient: Isaac Patel           Date of Birth: 1965/05/13           MRN: [id number] Visit Date: 05/09/2017              Requested by: No referring provider defined for this encounter. PCP: System, Provider Not In   Assessment & Plan: Visit Diagnoses:  1. Closed displaced fracture of proximal phalanx of right great toe, initial encounter     Plan: We will have him buddy tape the first and second toes.  He is weightbearing as tolerated in a Cam walker boot.  Follow-up with [place] in 2 weeks AP and lateral views of the right great toe.  Elevation of the foot encouraged.  Follow-Up Instructions: Return in about 2 weeks (around 05/23/2017) for Radiographs.   Orders:  No orders of the defined types were placed in this encounter.  No orders of the defined types were placed in this encounter.     Procedures: No procedures performed   Clinical Data: No additional findings.   Subjective: Toe fracture   HPI Isaac Patel is a 52 year old male comes in today due to a right toe fracture.  He slipped on the floor in his living room and struck his great toe against a piece of furniture.  He was seen at urgent care on 05/08/2017 and radiographs showed a right great toe fracture is placed in a postop boot.  He is given hydrocodone which he is yet to fill.  He had no other injury. Radiographs of the right foot are reviewed on canopy system and show a comminuted fracture involving the mid and distal aspect of the first proximal phalanx.  No other fractures identified throughout the foot.  Review of Systems Please see HPI otherwise negative  Objective: Vital Signs: There were no vitals taken for this visit.  Physical Exam  Constitutional: He is oriented to person, place, and time. He appears well-developed and well-nourished. No distress.  Pulmonary/Chest: Effort normal.  Neurological: He is alert and oriented to person, place, and time.  Skin: He is not  diaphoretic.    Ortho Exam Right foot ecchymosis of the great toe.  Some slight swelling throughout the dorsal aspect of the foot.  The remainder the foot is nontender.  Dorsal pedal pulses intact.  Specialty Comments:  No specialty comments available.  Imaging: Dg Foot Complete Right  Result Date: 05/08/2017 CLINICAL DATA:  Pain following fall EXAM: RIGHT FOOT COMPLETE - 3+ VIEW COMPARISON:  None. FINDINGS: Frontal, oblique, and lateral views were obtained. There is a comminuted fracture involving the mid and distal aspects of the first proximal phalanx. There is slight dorsal angulation of the distal major fracture fragment with respect to the major proximal fragment. No other fractures are evident. No dislocation. Joint spaces appear normal. Note that there is a degree of soft tissue swelling dorsally. IMPRESSION: Comminuted fracture involving the mid the distal aspects of the first proximal phalanx. No other fractures. No dislocation. No appreciable arthropathy. Soft tissue swelling noted dorsally. Electronically Signed   By: Lowella Grip III M.D.   On: 05/08/2017 14:19     PMFS History: Patient Active Problem List   Diagnosis Date Noted  . Left elbow pain 03/10/2017  . GERD with esophagitis 06/08/2016  . Essential hypertension 06/08/2016  . Chest pain with moderate risk for cardiac etiology 05/24/2016  . 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 Anxiety  state 11/15/2014  . CAD (coronary artery disease) 10/30/2014  . Tobacco abuse 10/30/2014  . HLD (hyperlipidemia) 10/30/2014  . History of non-ST elevation myocardial infarction (NSTEMI) 10/28/2014  . Routine general medical examination at a health care facility 05/29/2014  . OSA (obstructive sleep apnea) 05/08/2014  . Cholesteatoma of ear 05/08/2014   Past Medical History:  Diagnosis Date  . CAD (coronary artery disease)    a.  Non-STEMI 8/16: LHC-proximal LAD 80% treated with a resolute DES, EF 50-55%;  b.  Echo  8/16:  Moderate LVH, EF 55-60%, normal wall motion, normal diastolic function, normal RV function  . Depression    hx  . GERD (gastroesophageal reflux disease)   . Headache    "maybe monthly" (10/28/2014)  . HLD (hyperlipidemia)   . NSTEMI (non-ST elevated myocardial infarction) ([place]) 10/28/2014  . OSA (obstructive sleep apnea)    "tried mask; wear it off and on" (10/28/2014)    Family History  Problem Relation Age of Onset  . Diverticulitis Mother   . Skin cancer Mother   . Healthy Father   . Bradycardia Father   . Multiple sclerosis Maternal Grandfather   . Colon cancer Neg Hx   . Stomach cancer Neg Hx   . Rectal cancer Neg Hx   . Liver cancer Neg Hx   . Esophageal cancer Neg Hx     Past Surgical History:  Procedure Laterality Date  . 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 CARDIAC CATHETERIZATION N/A 10/29/2014   Procedure: Left Heart Cath and Coronary Angiography;  Surgeon: Troy Sine, MD;  Location: [facility];  Service: Cardiovascular;  Laterality: N/A;  . CHOLESTEATOMA EXCISION Right 1990's  . LEFT HEART CATH AND CORONARY ANGIOGRAPHY N/A 05/25/2016   Procedure: Left Heart Cath and Coronary Angiography;  Surgeon: Sherren Mocha, MD;  Location: [facility];  Service: Cardiovascular;  Laterality: N/A;  . TYMPANOSTOMY TUBE PLACEMENT Bilateral "as a kid"   "had one done as an adult too"   Social History   Occupational History  . Occupation: CMA  Tobacco Use  . Smoking status: Current Some Day Smoker    Packs/day: 0.33    Years: 32.00    Pack years: 10.56    Types: Cigarettes  . Smokeless tobacco: Never Used  Substance and Sexual Activity  . Alcohol use: No    Alcohol/week: 1.2 - 1.8 oz    Types: 2 - 3 Standard drinks or equivalent per week    Frequency: Never    Comment: stopped 06/19/2015,   . Drug use: Yes    Types: Marijuana    Comment: 05/24/2016  "stopped marijuana in the early 2000's"  . Sexual activity: Yes

## 2017-05-12 ENCOUNTER — Ambulatory Visit: Payer: 59 | Admitting: Nurse Practitioner

## 2017-05-18 ENCOUNTER — Encounter (INDEPENDENT_AMBULATORY_CARE_PROVIDER_SITE_OTHER): Payer: Self-pay

## 2017-05-18 ENCOUNTER — Telehealth (INDEPENDENT_AMBULATORY_CARE_PROVIDER_SITE_OTHER): Payer: Self-pay | Admitting: Orthopaedic Surgery

## 2017-05-18 NOTE — Telephone Encounter (Signed)
That will be fine. 

## 2017-05-18 NOTE — Telephone Encounter (Signed)
Patient called requesting to have a work letter - releasing him from all restrictions. He said he is currently only working 4 hour days but knows he is capable of working an 8 hour day. He wears a covered toe boot and has no problems walking around. He has an appt Monday but is hoping this can be done before he is evaluated. If you can fax the letter to [phone] Attn: Mancel Parsons.

## 2017-05-18 NOTE — Telephone Encounter (Signed)
Ok

## 2017-05-18 NOTE — Telephone Encounter (Signed)
Faxed note to provided number

## 2017-05-19 ENCOUNTER — Encounter (INDEPENDENT_AMBULATORY_CARE_PROVIDER_SITE_OTHER): Payer: Self-pay

## 2017-05-19 ENCOUNTER — Encounter (INDEPENDENT_AMBULATORY_CARE_PROVIDER_SITE_OTHER): Payer: Self-pay | Admitting: Physician Assistant

## 2017-05-23 ENCOUNTER — Ambulatory Visit (INDEPENDENT_AMBULATORY_CARE_PROVIDER_SITE_OTHER): Payer: 59

## 2017-05-23 ENCOUNTER — Encounter (INDEPENDENT_AMBULATORY_CARE_PROVIDER_SITE_OTHER): Payer: Self-pay | Admitting: Physician Assistant

## 2017-05-23 ENCOUNTER — Ambulatory Visit (INDEPENDENT_AMBULATORY_CARE_PROVIDER_SITE_OTHER): Payer: 59 | Admitting: Physician Assistant

## 2017-05-23 DIAGNOSIS — S92404D Nondisplaced unspecified fracture of right great toe, subsequent encounter for fracture with routine healing: Secondary | ICD-10-CM

## 2017-05-23 NOTE — Progress Notes (Signed)
Office Visit Note   Patient: Isaac Patel           Date of Birth: 06/22/1965           MRN: [id number] Visit Date: 05/23/2017              Requested by: No referring provider defined for this encounter. PCP: System, Provider Not In   Assessment & Plan: Visit Diagnoses:  1. Closed nondisplaced fracture of phalanx of right great toe with routine healing, unspecified phalanx, subsequent encounter     Plan: Continue the Darco shoe.  Weightbearing as tolerated right foot.  He will follow-up with [place] in 4 weeks and will obtain 3 views of the right great toe.  Follow-Up Instructions: Return in about 4 weeks (around 06/20/2017).   Orders:  Orders Placed This Encounter  Procedures  . XR Toe Great Right   No orders of the defined types were placed in this encounter.     Procedures: No procedures performed   Clinical Data: No additional findings.   Subjective: Chief Complaint  Patient presents with  . Right Foot - Follow-up    HPI Isaac Patel returns today follow-up of his right great toe fracture.  He did find a closed Darco shoe on And has been able to wear this and return to work.  He states he still has some soreness in the right great toe but overall trending towards improvement.  States swelling is diminishing. Review of Systems See HPI  Objective: Vital Signs: There were no vitals taken for this visit.  Physical Exam  Constitutional: He is oriented to person, place, and time. He appears well-developed and well-nourished. No distress.  Neurological: He is alert and oriented to person, place, and time.  Skin: He is not diaphoretic.  Psychiatric: He has a normal mood and affect.    Ortho Exam Right great toe there is no tenting of the skin.  No signs of skin breakdown.  Some mild edema.  Has tenderness over the first proximal phalanx.  Specialty Comments:  No specialty comments available.  Imaging: Xr Toe Great Right  Result Date: 05/23/2017 Right great  toe: The first proximal phalanx has some early signs of callus formation.  No change in overall fracture alignment or position.  No other fractures seen.    PMFS History: Patient Active Problem List   Diagnosis Date Noted  . Left elbow pain 03/10/2017  . GERD with esophagitis 06/08/2016  . Essential hypertension 06/08/2016  . Chest pain with moderate risk for cardiac etiology 05/24/2016  . 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 Anxiety state 11/15/2014  . CAD (coronary artery disease) 10/30/2014  . Tobacco abuse 10/30/2014  . HLD (hyperlipidemia) 10/30/2014  . History of non-ST elevation myocardial infarction (NSTEMI) 10/28/2014  . Routine general medical examination at a health care facility 05/29/2014  . OSA (obstructive sleep apnea) 05/08/2014  . Cholesteatoma of ear 05/08/2014   Past Medical History:  Diagnosis Date  . CAD (coronary artery disease)    a.  Non-STEMI 8/16: LHC-proximal LAD 80% treated with a resolute DES, EF 50-55%;  b.  Echo 8/16:  Moderate LVH, EF 55-60%, normal wall motion, normal diastolic function, normal RV function  . Depression    hx  . GERD (gastroesophageal reflux disease)   . Headache    "maybe monthly" (10/28/2014)  . HLD (hyperlipidemia)   . NSTEMI (non-ST elevated myocardial infarction) ([place]) 10/28/2014  . OSA (obstructive sleep apnea)    "tried mask;  wear it off and on" (10/28/2014)    Family History  Problem Relation Age of Onset  . Diverticulitis Mother   . Skin cancer Mother   . Healthy Father   . Bradycardia Father   . Multiple sclerosis Maternal Grandfather   . Colon cancer Neg Hx   . Stomach cancer Neg Hx   . Rectal cancer Neg Hx   . Liver cancer Neg Hx   . Esophageal cancer Neg Hx     Past Surgical History:  Procedure Laterality Date  . 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 CARDIAC CATHETERIZATION N/A 10/29/2014   Procedure: Left Heart Cath and Coronary Angiography;  Surgeon: Troy Sine, MD;   Location: [facility];  Service: Cardiovascular;  Laterality: N/A;  . CHOLESTEATOMA EXCISION Right 1990's  . LEFT HEART CATH AND CORONARY ANGIOGRAPHY N/A 05/25/2016   Procedure: Left Heart Cath and Coronary Angiography;  Surgeon: Sherren Mocha, MD;  Location: [facility];  Service: Cardiovascular;  Laterality: N/A;  . TYMPANOSTOMY TUBE PLACEMENT Bilateral "as a kid"   "had one done as an adult too"   Social History   Occupational History  . Occupation: CMA  Tobacco Use  . Smoking status: Current Some Day Smoker    Packs/day: 0.33    Years: 32.00    Pack years: 10.56    Types: Cigarettes  . Smokeless tobacco: Never Used  Substance and Sexual Activity  . Alcohol use: No    Alcohol/week: 1.2 - 1.8 oz    Types: 2 - 3 Standard drinks or equivalent per week    Frequency: Never    Comment: stopped 06/19/2015,   . Drug use: Yes    Types: Marijuana    Comment: 05/24/2016  "stopped marijuana in the early 2000's"  . Sexual activity: Yes

## 2017-06-07 ENCOUNTER — Other Ambulatory Visit: Payer: Self-pay | Admitting: Nurse Practitioner

## 2017-06-20 ENCOUNTER — Encounter (INDEPENDENT_AMBULATORY_CARE_PROVIDER_SITE_OTHER): Payer: Self-pay | Admitting: Physician Assistant

## 2017-06-20 ENCOUNTER — Ambulatory Visit (INDEPENDENT_AMBULATORY_CARE_PROVIDER_SITE_OTHER): Payer: 59 | Admitting: Physician Assistant

## 2017-06-20 ENCOUNTER — Ambulatory Visit (INDEPENDENT_AMBULATORY_CARE_PROVIDER_SITE_OTHER): Payer: 59

## 2017-06-20 DIAGNOSIS — S92404D Nondisplaced unspecified fracture of right great toe, subsequent encounter for fracture with routine healing: Secondary | ICD-10-CM | POA: Diagnosis not present

## 2017-06-20 NOTE — Progress Notes (Signed)
Office Visit Note   Patient: Isaac Patel           Date of Birth: 1965-10-25           MRN: [id number] Visit Date: 06/20/2017              Requested by: No referring provider defined for this encounter. PCP: System, Provider Not In   Assessment & Plan: Visit Diagnoses:  1. Closed nondisplaced fracture of phalanx of right great toe with routine healing, unspecified phalanx, subsequent encounter     Plan: Discussed with him no high impact activities for the next 4 weeks.  Then activities as tolerated.  He will follow-up with [place] as needed.  Follow-Up Instructions: Return if symptoms worsen or fail to improve.   Orders:  Orders Placed This Encounter  Procedures  . XR Toe Great Right   No orders of the defined types were placed in this encounter.     Procedures: No procedures performed   Clinical Data: No additional findings.   Subjective: Chief Complaint  Patient presents with  . Right Great Toe - Fracture, Follow-up    HPI [name] returns today follow-up of his right great toe fracture.  He is now 6 weeks status post right fracture involving the proximal phalanx.  He states overall he is trending towards improvement.  He is in a regular shoe.  He has been able to do some yard work.  No high impact activities as of yet.   Review of Systems See HPI otherwise negative  Objective: Vital Signs: There were no vitals taken for this visit.  Physical Exam  Constitutional: He appears well-developed and well-nourished. No distress.  Cardiovascular: Intact distal pulses.  Skin: He is not diaphoretic.    Ortho Exam Right foot: No rashes skin lesions ulcerations.  Slight edema of the right great toe.  No abnormal warmth erythema or ecchymosis.  Nontender over the right great toe particularly the proximal phalanx.  No malalignment. Specialty Comments:  No specialty comments available.  Imaging: Xr Toe Great Right  Result Date: 06/20/2017 Right great toe 3  views: Further consolidation of the fracture seen.  No acute fractures otherwise.  Fracture in overall good position alignment.  There is extension into the IP joint.    PMFS History: Patient Active Problem List   Diagnosis Date Noted  . Left elbow pain 03/10/2017  . GERD with esophagitis 06/08/2016  . Essential hypertension 06/08/2016  . Chest pain with moderate risk for cardiac etiology 05/24/2016  . 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 Anxiety state 11/15/2014  . CAD (coronary artery disease) 10/30/2014  . Tobacco abuse 10/30/2014  . HLD (hyperlipidemia) 10/30/2014  . History of non-ST elevation myocardial infarction (NSTEMI) 10/28/2014  . Routine general medical examination at a health care facility 05/29/2014  . OSA (obstructive sleep apnea) 05/08/2014  . Cholesteatoma of ear 05/08/2014   Past Medical History:  Diagnosis Date  . CAD (coronary artery disease)    a.  Non-STEMI 8/16: LHC-proximal LAD 80% treated with a resolute DES, EF 50-55%;  b.  Echo 8/16:  Moderate LVH, EF 55-60%, normal wall motion, normal diastolic function, normal RV function  . Depression    hx  . GERD (gastroesophageal reflux disease)   . Headache    "maybe monthly" (10/28/2014)  . HLD (hyperlipidemia)   . NSTEMI (non-ST elevated myocardial infarction) ([place]) 10/28/2014  . OSA (obstructive sleep apnea)    "tried mask; wear it off and on" (  10/28/2014)    Family History  Problem Relation Age of Onset  . Diverticulitis Mother   . Skin cancer Mother   . Healthy Father   . Bradycardia Father   . Multiple sclerosis Maternal Grandfather   . Colon cancer Neg Hx   . Stomach cancer Neg Hx   . Rectal cancer Neg Hx   . Liver cancer Neg Hx   . Esophageal cancer Neg Hx     Past Surgical History:  Procedure Laterality Date  . 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 CARDIAC CATHETERIZATION N/A 10/29/2014   Procedure: Left Heart Cath and Coronary Angiography;  Surgeon: Troy Sine, MD;  Location: [facility];  Service: Cardiovascular;  Laterality: N/A;  . CHOLESTEATOMA EXCISION Right 1990's  . LEFT HEART CATH AND CORONARY ANGIOGRAPHY N/A 05/25/2016   Procedure: Left Heart Cath and Coronary Angiography;  Surgeon: Sherren Mocha, MD;  Location: [facility];  Service: Cardiovascular;  Laterality: N/A;  . TYMPANOSTOMY TUBE PLACEMENT Bilateral "as a kid"   "had one done as an adult too"   Social History   Occupational History  . Occupation: CMA  Tobacco Use  . Smoking status: Current Some Day Smoker    Packs/day: 0.33    Years: 32.00    Pack years: 10.56    Types: Cigarettes  . Smokeless tobacco: Never Used  Substance and Sexual Activity  . Alcohol use: No    Alcohol/week: 1.2 - 1.8 oz    Types: 2 - 3 Standard drinks or equivalent per week    Frequency: Never    Comment: stopped 06/19/2015,   . Drug use: Yes    Types: Marijuana    Comment: 05/24/2016  "stopped marijuana in the early 2000's"  . Sexual activity: Yes

## 2017-07-20 ENCOUNTER — Encounter: Payer: Self-pay | Admitting: Nurse Practitioner

## 2017-07-20 ENCOUNTER — Ambulatory Visit: Payer: 59 | Admitting: Nurse Practitioner

## 2017-07-20 VITALS — BP 124/78 | HR 58 | Temp 98.2°F | Resp 16 | Ht 71.0 in | Wt 204.0 lb

## 2017-07-20 DIAGNOSIS — K2100 Gastro-esophageal reflux disease with esophagitis, without bleeding: Secondary | ICD-10-CM

## 2017-07-20 DIAGNOSIS — H6121 Impacted cerumen, right ear: Secondary | ICD-10-CM

## 2017-07-20 DIAGNOSIS — R140 Abdominal distension (gaseous): Secondary | ICD-10-CM | POA: Insufficient documentation

## 2017-07-20 DIAGNOSIS — K760 Fatty (change of) liver, not elsewhere classified: Secondary | ICD-10-CM | POA: Insufficient documentation

## 2017-07-20 MED ORDER — PANTOPRAZOLE SODIUM 40 MG PO TBEC: 40.0000 mg | DELAYED_RELEASE_TABLET | Freq: Every day | ORAL | 0 refills | Status: DC

## 2017-07-20 NOTE — Assessment & Plan Note (Signed)
Stable, continue protonix F/U for new or worsening symptoms

## 2017-07-20 NOTE — Patient Instructions (Signed)
Please return in about a month for your annual physical.  It was nice to meet you. Thanks for letting me take care of you today :)   Earwax Buildup, Adult The ears produce a substance called earwax that helps keep bacteria out of the ear and protects the skin in the ear canal. Occasionally, earwax can build up in the ear and cause discomfort or hearing loss. What increases the risk? This condition is more likely to develop in people who:  Are male.  Are elderly.  Naturally produce more earwax.  Clean their ears often with cotton swabs.  Use earplugs often.  Use in-ear headphones often.  Wear hearing aids.  Have narrow ear canals.  Have earwax that is overly thick or sticky.  Have eczema.  Are dehydrated.  Have excess hair in the ear canal.  What are the signs or symptoms? Symptoms of this condition include:  Reduced or muffled hearing.  A feeling of fullness in the ear or feeling that the ear is plugged.  Fluid coming from the ear.  Ear pain.  Ear itch.  Ringing in the ear.  Coughing.  An obvious piece of earwax that can be seen inside the ear canal.  How is this diagnosed? This condition may be diagnosed based on:  Your symptoms.  Your medical history.  An ear exam. During the exam, your health care provider will look into your ear with an instrument called an otoscope.  You may have tests, including a hearing test. How is this treated? This condition may be treated by:  Using ear drops to soften the earwax.  Having the earwax removed by a health care provider. The health care provider may: ? Flush the ear with water. ? Use an instrument that has a loop on the end (curette). ? Use a suction device.  Surgery to remove the wax buildup. This may be done in severe cases.  Follow these instructions at home:  Take over-the-counter and prescription medicines only as told by your health care provider.  Do not put any objects, including cotton  swabs, into your ear. You can clean the opening of your ear canal with a washcloth or facial tissue.  Follow instructions from your health care provider about cleaning your ears. Do not over-clean your ears.  Drink enough fluid to keep your urine clear or pale yellow. This will help to thin the earwax.  Keep all follow-up visits as told by your health care provider. If earwax builds up in your ears often or if you use hearing aids, consider seeing your health care provider for routine, preventive ear cleanings. Ask your health care provider how often you should schedule your cleanings.  If you have hearing aids, clean them according to instructions from the manufacturer and your health care provider. Contact a health care provider if:  You have ear pain.  You develop a fever.  You have blood, pus, or other fluid coming from your ear.  You have hearing loss.  You have ringing in your ears that does not go away.  Your symptoms do not improve with treatment.  You feel like the room is spinning (vertigo). Summary  Earwax can build up in the ear and cause discomfort or hearing loss.  The most common symptoms of this condition include reduced or muffled hearing and a feeling of fullness in the ear or feeling that the ear is plugged.  This condition may be diagnosed based on your symptoms, your medical history, and an  ear exam.  This condition may be treated by using ear drops to soften the earwax or by having the earwax removed by a health care provider.  Do not put any objects, including cotton swabs, into your ear. You can clean the opening of your ear canal with a washcloth or facial tissue. This information is not intended to replace advice given to you by your health care provider. Make sure you discuss any questions you have with your health care provider. Document Released: 04/22/2004 Document Revised: 05/26/2016 Document Reviewed: 05/26/2016 Elsevier Interactive Patient  Education  [employer].

## 2017-07-20 NOTE — Progress Notes (Signed)
Name: Isaac Patel   MRN: [id number]    DOB: July 12, 1965   Date:07/20/2017       Progress Note  Subjective  Chief Complaint  Chief Complaint  Patient presents with  . Establish Care    HPI Isaac Patel is establishing care with me today. 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 He is currently following routinely with cardiology for CAD s/p NSTEMI and hypertension, pulmonology for OSA. He was evaluated last year by gastroenterology for GERD and abdominal bloating with no further recommendations for testing and prn follow up. Per GI note 08/26/16 next screening colonoscopy is due 2023. He has continued dicyclomine prn and pantoprazole daily as instructed by gastrenterology at his last visit. He does experience symptoms of heartburn when he misses doses of pantoprazole, but does not notice any symptoms if he takes the medication daily as prescribed. He would like to be evaluated for decreased hearing and stuffiness to his right ear today . He has a history of surgical removal of cholesteatoma of right ear and says he often gets increased earwax in this ear. He denies fevers, headaches, ear pain, otorrhea, abdominal pain, nausea, vomiting, constipation, diarrhea, rectal bleeding.  Patient Active Problem List   Diagnosis Date Noted  . Left elbow pain 03/10/2017  . GERD with esophagitis 06/08/2016  . Essential hypertension 06/08/2016  . Chest pain with moderate risk for cardiac etiology 05/24/2016  . 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 Anxiety state 11/15/2014  . CAD (coronary artery disease) 10/30/2014  . Tobacco abuse 10/30/2014  . HLD (hyperlipidemia) 10/30/2014  . History of non-ST elevation myocardial infarction (NSTEMI) 10/28/2014  . Routine general medical examination at a health care facility 05/29/2014  . OSA (obstructive sleep apnea) 05/08/2014  . 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 CARDIAC CATHETERIZATION N/A 10/29/2014   Procedure: Left Heart Cath and Coronary Angiography;  Surgeon: Troy Sine, MD;  Location: [facility];  Service: Cardiovascular;  Laterality: N/A;  . CHOLESTEATOMA EXCISION Right 1990's  . LEFT HEART CATH AND CORONARY ANGIOGRAPHY N/A 05/25/2016   Procedure: Left Heart Cath and Coronary Angiography;  Surgeon: Sherren Mocha, MD;  Location: [facility];  Service: Cardiovascular;  Laterality: N/A;  . TYMPANOSTOMY TUBE PLACEMENT Bilateral "as a kid"   "had one done as an adult too"    Family History  Problem Relation Age of Onset  . Diverticulitis Mother   . Skin cancer Mother   . Healthy Father   . Bradycardia Father   . Multiple sclerosis Maternal Grandfather   . Colon cancer Neg Hx   . Stomach cancer Neg Hx   . Rectal cancer Neg Hx   . Liver cancer Neg Hx   . Esophageal cancer Neg Hx     Social History   Socioeconomic History  . Marital status: Married    Spouse name: Not on file  . Number of children: 2  . Years of education: 95  . Highest education level: Not on file  Occupational History  . Occupation: CMA  Social Needs  . Financial resource strain: Not on file  . Food insecurity:    Worry: Not on file    Inability: Not on file  . Transportation needs:    Medical: Not on file    Non-medical: Not on file  Tobacco Use  . Smoking status: Current Some Day Smoker    Packs/day: 0.33    Years: 32.00    Pack years: 10.56    Types: Cigarettes  . Smokeless tobacco: Never Used  Substance and Sexual Activity  . Alcohol use: No    Alcohol/week: 1.2 - 1.8 oz    Types: 2 - 3 Standard drinks or equivalent per week    Frequency: Never    Comment: stopped 06/19/2015,   . Drug use: Yes    Types: Marijuana    Comment: 05/24/2016  "stopped marijuana in the early 2000's"  . Sexual activity: Yes  Lifestyle  . Physical activity:    Days per week: Not on file     Minutes per session: Not on file  . Stress: Not on file  Relationships  . Social connections:    Talks on phone: Not on file    Gets together: Not on file    Attends religious service: Not on file    Active member of club or organization: Not on file    Attends meetings of clubs or organizations: Not on file    Relationship status: Not on file  . Intimate partner violence:    Fear of current or ex partner: Not on file    Emotionally abused: Not on file    Physically abused: Not on file    Forced sexual activity: Not on file  Other Topics Concern  . Not on file  Social History Narrative   Born and raised in [place], [place].  Currently resides in a house with his wife and children. 1 dog. Fun: play computer games, sports.    Denies any religious beliefs effecting health care.    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 [address], [address]     Current Outpatient Medications:  .  aspirin 81 MG tablet, Take 1 tablet (81 mg total) by mouth daily., Disp: 30 tablet, Rfl:  .  atorvastatin (LIPITOR) 80 MG tablet, Take 1 tablet (80 mg total) by mouth daily at 6 PM., Disp: 90 tablet, Rfl: 3 .  clopidogrel (PLAVIX) 75 MG tablet, Take 1 tablet (75 mg total) by mouth daily., Disp: 90 tablet, Rfl: 3 .  dicyclomine (BENTYL) 10 MG capsule, Take 1 capsule (10 mg total) by mouth 4 (four) times daily as needed for spasms. For abdominal pain and bloating, Disp: 120 capsule, Rfl: 5 .  fluticasone (FLONASE) 50 MCG/ACT nasal spray, Place 2 sprays into both nostrils daily as needed for allergies or rhinitis. , Disp: , Rfl:  .  lisinopril (PRINIVIL,ZESTRIL) 10 MG tablet, Take 1 tablet (10 mg total) by mouth daily., Disp: 90 tablet, Rfl: 3 .  loratadine (CLARITIN) 10 MG tablet, Take 10 mg by mouth daily., Disp: , Rfl:  .  Multiple Vitamin (MULTI-VITAMIN PO), Take 1 tablet by mouth daily., Disp: , Rfl:  .  nitroGLYCERIN (NITROSTAT) 0.4 MG SL tablet, Place 1 tablet (0.4 mg total) under the  tongue every 5 (five) minutes x 3 doses as needed for chest pain., Disp: 25 tablet, Rfl: 12 .  nystatin ointment (MYCOSTATIN), Apply 1 application topically daily as needed (arm rash)., Disp: , Rfl:  .  pantoprazole (PROTONIX) 40 MG tablet, Take 1 tablet (40 mg total) by mouth daily., Disp: 90 tablet, Rfl: 0  Allergies  Allergen Reactions  . Morphine And Related Itching     ROS See HPI  Objective  Vitals:   07/20/17  0826  BP: 124/78  Pulse: (!) 58  Resp: 16  Temp: 98.2 F (36.8 C)  TempSrc: Oral  SpO2: 95%  Weight: 204 lb (92.5 kg)  Height: 5\' 11"  (1.803 m)    Body mass index is 28.45 kg/m.  Physical Exam Vital signs reviewed. Constitutional: Patient appears well-developed and well-nourished. No distress.  HENT: Head: Normocephalic and atraumatic. Ears: Left TM without erythema or effusion; Right TM without erythema or effusion after cerumen disimpaction, scarring noted; Nose: Nose normal. Mouth/Throat: Oropharynx is clear and moist.  Eyes: Conjunctivae and EOM are normal. Pupils are equal, round, and reactive to light. No scleral icterus.  Neck: Normal range of motion. Neck supple.  Cardiovascular: Normal rate, regular rhythm and normal heart sounds.  No murmur heard. Distal pulses intact. Pulmonary/Chest: Effort normal and breath sounds normal. No respiratory distress. Abdominal: Soft. No distension. Neurological: He is alert and oriented to person, place, and time. Coordination, balance, strength, speech and gait are normal.  Skin: Skin is warm and dry. No rash noted. No erythema.  Psychiatric: Patient has a normal mood and affect. behavior is normal. Judgment and thought content normal.  Assessment & Plan RTC in 1 month for CPE CBC, CMET wnl 07/14/17  Impacted cerumen of right ear Irrigation of right ear with successful removal of cerumen impaction Patient tolerated well F/u for new or worsening symptoms

## 2017-07-20 NOTE — Assessment & Plan Note (Signed)
Stable, continue bentyl prn F/U for new or worsening symptoms

## 2017-08-04 ENCOUNTER — Encounter: Payer: Self-pay | Admitting: Nurse Practitioner

## 2017-08-05 ENCOUNTER — Other Ambulatory Visit: Payer: Self-pay | Admitting: Family

## 2017-08-12 ENCOUNTER — Encounter: Payer: Self-pay | Admitting: Nurse Practitioner

## 2017-08-12 ENCOUNTER — Ambulatory Visit: Payer: 59 | Admitting: Nurse Practitioner

## 2017-08-12 VITALS — BP 118/80 | HR 98 | Temp 98.6°F | Resp 18 | Ht 71.0 in | Wt 204.0 lb

## 2017-08-12 DIAGNOSIS — S58112S Complete traumatic amputation at level between elbow and wrist, left arm, sequela: Secondary | ICD-10-CM | POA: Diagnosis not present

## 2017-08-12 DIAGNOSIS — M25521 Pain in right elbow: Secondary | ICD-10-CM | POA: Diagnosis not present

## 2017-08-12 MED ORDER — DICLOFENAC SODIUM 1 % TD GEL: 2.0000 g | Freq: Four times a day (QID) | TRANSDERMAL | 0 refills | Status: DC

## 2017-08-12 NOTE — Progress Notes (Addendum)
Name: Isaac Patel   MRN: [id number]    DOB: [birth year]   Date:08/12/2017       Progress Note  Subjective  Chief Complaint  Chief Complaint  Patient presents with  . Paper work    needs notes for prosthetic items for left arm    HPI We will also discuss acute complaint of right elbow pain during the visit today  Left arm amputation- Below elbow amputation occurred about 12 years ago in industrial accident- his arm was pulled into a machine He has left arm prosthesis which allows him to continue use of left arm He is in need of multiple parts for repair, which are worn out and no longer function due to every day use Parts needed are: [employer] I-Limb needs a -  medium glove cover and a charge port nut,  [employer]to[employer] Sensor hand needs -  2 sets of bearings and 2 bearing retainers,  [employer] Greifer hand needs -   set of grip pads  Replacement of silicone socket due to normal wear and tear, daily use 8-12 hours a day.  Also needs a new silicone sleeve  Right elbow pain This is an acute problem He has experienced pain and tenderness in right lateral elbow since overusing his arm recently to do yard work- raked several bags of leaves He denies swelling, discoloration, radiation, numbness or tingling  Patient Active Problem List   Diagnosis Date Noted  . Fatty liver 07/20/2017  . Abdominal bloating 07/20/2017  . Left elbow pain 03/10/2017  . GERD with esophagitis 06/08/2016  . Essential hypertension 06/08/2016  . Chest pain with moderate risk for cardiac etiology 05/24/2016  . 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 Anxiety state 11/15/2014  . CAD (coronary artery disease) 10/30/2014  . Tobacco abuse 10/30/2014  . HLD (hyperlipidemia) 10/30/2014  . History of non-ST elevation myocardial infarction (NSTEMI) 10/28/2014  . Routine general medical examination at a health care facility 05/29/2014  . OSA (obstructive sleep apnea) 05/08/2014  . Cholesteatoma of ear  05/08/2014    Past Surgical History:  Procedure Laterality Date  . 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 CARDIAC CATHETERIZATION N/A 10/29/2014   Procedure: Left Heart Cath and Coronary Angiography;  Surgeon: Troy Sine, MD;  Location: [facility];  Service: Cardiovascular;  Laterality: N/A;  . CHOLESTEATOMA EXCISION Right 1990's  . LEFT HEART CATH AND CORONARY ANGIOGRAPHY N/A 05/25/2016   Procedure: Left Heart Cath and Coronary Angiography;  Surgeon: Sherren Mocha, MD;  Location: [facility];  Service: Cardiovascular;  Laterality: N/A;  . TYMPANOSTOMY TUBE PLACEMENT Bilateral "as a kid"   "had one done as an adult too"    Family History  Problem Relation Age of Onset  . Diverticulitis Mother   . Skin cancer Mother   . Healthy Father   . Bradycardia Father   . Multiple sclerosis Maternal Grandfather   . Colon cancer Neg Hx   . Stomach cancer Neg Hx   . Rectal cancer Neg Hx   . Liver cancer Neg Hx   . Esophageal cancer Neg Hx     Social History   Socioeconomic History  . Marital status: Married    Spouse name: Not on file  . Number of children: 2  . Years of education: 56  . Highest education level: Not on file  Occupational History  . Occupation: CMA  Social Needs  . Financial resource strain: Not on file  .  Food insecurity:    Worry: Not on file    Inability: Not on file  . Transportation needs:    Medical: Not on file    Non-medical: Not on file  Tobacco Use  . Smoking status: Current Some Day Smoker    Packs/day: 0.33    Years: 32.00    Pack years: 10.56    Types: Cigarettes  . Smokeless tobacco: Never Used  Substance and Sexual Activity  . Alcohol use: No    Alcohol/week: 1.2 - 1.8 oz    Types: 2 - 3 Standard drinks or equivalent per week    Frequency: Never    Comment: stopped 06/19/2015,   . Drug use: Yes    Types: Marijuana    Comment: 05/24/2016  "stopped marijuana in the early 2000's"  . Sexual activity:  Yes  Lifestyle  . Physical activity:    Days per week: Not on file    Minutes per session: Not on file  . Stress: Not on file  Relationships  . Social connections:    Talks on phone: Not on file    Gets together: Not on file    Attends religious service: Not on file    Active member of club or organization: Not on file    Attends meetings of clubs or organizations: Not on file    Relationship status: Not on file  . Intimate partner violence:    Fear of current or ex partner: Not on file    Emotionally abused: Not on file    Physically abused: Not on file    Forced sexual activity: Not on file  Other Topics Concern  . Not on file  Social History Narrative   Born and raised in [place], [place].  Currently resides in a house with his wife and children. 1 dog. Fun: play computer games, sports.    Denies any religious beliefs effecting health care.    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  aspirin 81 MG tablet, Take 1 tablet (81 mg total) by mouth daily., Disp: 30 tablet, Rfl:  .  atorvastatin (LIPITOR) 80 MG tablet, Take 1 tablet (80 mg total) by mouth daily at 6 PM., Disp: 90 tablet, Rfl: 3 .  clopidogrel (PLAVIX) 75 MG tablet, Take 1 tablet (75 mg total) by mouth daily., Disp: 90 tablet, Rfl: 3 .  diclofenac sodium (VOLTAREN) 1 % GEL, Apply 2 g topically 4 (four) times daily., Disp: 100 g, Rfl: 0 .  dicyclomine (BENTYL) 10 MG capsule, Take 1 capsule (10 mg total) by mouth 4 (four) times daily as needed for spasms. For abdominal pain and bloating, Disp: 120 capsule, Rfl: 5 .  fluticasone (FLONASE) 50 MCG/ACT nasal spray, Place 2 sprays into both nostrils daily as needed for allergies or rhinitis. , Disp: , Rfl:  .  lisinopril (PRINIVIL,ZESTRIL) 10 MG tablet, Take 1 tablet (10 mg total) by mouth daily., Disp: 90 tablet, Rfl: 3 .  loratadine (CLARITIN) 10 MG tablet, Take 10 mg by mouth daily., Disp: , Rfl:   .  Multiple Vitamin (MULTI-VITAMIN PO), Take 1 tablet by mouth daily., Disp: , Rfl:  .  nitroGLYCERIN (NITROSTAT) 0.4 MG SL tablet, Place 1 tablet (0.4 mg total) under the tongue every 5 (five) minutes x 3 doses as needed for chest pain., Disp: 25 tablet, Rfl: 12 .  nystatin ointment (MYCOSTATIN), Apply 1 application  topically daily as needed (arm rash)., Disp: , Rfl:  .  pantoprazole (PROTONIX) 40 MG tablet, Take 1 tablet (40 mg total) by mouth daily., Disp: 90 tablet, Rfl: 0  Allergies  Allergen Reactions  . Morphine And Related Itching     ROS SEe HPI  Objective  Vitals:   08/12/17 1310  BP: 118/80  Pulse: (!) 117  Resp: 18  Temp: 98.6 F (37 C)  TempSrc: Oral  SpO2: 98%  Weight: 204 lb (92.5 kg)  Height: 5\' 11"  (1.803 m)    Body mass index is 28.45 kg/m.  Physical Exam Vital signs reviewed. Constitutional: Patient appears well-developed and well-nourished. No distress.  HENT: Head: Normocephalic and atraumatic. Nose: Nose normal. Mouth/Throat: Oropharynx is clear and moist.  Eyes: Conjunctivae and EOM are normal. Pupils are equal, round, and reactive to light. No scleral icterus.  Neck: Normal range of motion. Neck supple.  Cardiovascular: Normal rate, Distal pulses intact. Pulmonary/Chest: Effort normal ,No respiratory distress. Musculoskeletal: left arm- below elbow amputation with prosthesis. Tenderness to right lateral elbow, no erythema, swelling, deformity. Neurological: He is alert and oriented to person, place, and time. Coordination, balance, strength, speech and gait are normal.  Skin: Skin is warm and dry. No rash noted. No erythema.  Psychiatric: Patient has a normal mood and affect. behavior is normal. Judgment and thought content normal.  Assessment & Plan RTC for CPE  1. Elbow pain, right Will avoid oral NSAIDS- pt currently on plavix Rx for diclofenac gel given- dosing and side effects discussed Discussed home management of epicondylitis  including return precautions Recommend F/U with sports med- he will see sports medicine providers at his workplace - diclofenac sodium (VOLTAREN) 1 % GEL; Apply 2 g topically 4 (four) times daily.  Dispense: 100 g; Refill: 0

## 2017-08-12 NOTE — Patient Instructions (Signed)
I have sent a prescription for diclofenac gel to apply 4 times daily to your elbow. Please schedule a follow up appointment for further evaluation here with Dr Tamala Julian or Dr Raeford Razor, our sports medicine providers  We will contact you regarding prosthesis prescription.   Tennis Elbow Tennis elbow is puffiness (inflammation) of the outer tendons of your forearm close to your elbow. Your tendons attach your muscles to your bones. Tennis elbow can happen in any sport or job in which you use your elbow too much. It is caused by doing the same motion over and over. Tennis elbow can cause:  Pain and tenderness in your forearm and the outer part of your elbow.  A burning feeling. This runs from your elbow through your arm.  Weak grip in your hands.  Follow these instructions at home: Activity  Rest your elbow and wrist as told by your doctor. Try to avoid any activities that caused the problem until your doctor says that you can do them again.  If a physical therapist teaches you exercises, do all of them as told.  If you lift an object, lift it with your palm facing up. This is easier on your elbow. Lifestyle  If your tennis elbow is caused by sports, check your equipment and make sure that: ? You are using it correctly. ? It fits you well.  If your tennis elbow is caused by work, take breaks often, if you are able. Talk with your manager about doing your work in a way that is safe for you. ? If your tennis elbow is caused by computer use, talk with your manager about any changes that can be made to your work setup. General instructions  If told, apply ice to the painful area: ? Put ice in a plastic bag. ? Place a towel between your skin and the bag. ? Leave the ice on for 20 minutes, 2-3 times per day.  Take medicines only as told by your doctor.  If you were given a brace, wear it as told by your doctor.  Keep all follow-up visits as told by your doctor. This is important. Contact  a doctor if:  Your pain does not get better with treatment.  Your pain gets worse.  You have weakness in your forearm, hand, or fingers.  You cannot feel your forearm, hand, or fingers. This information is not intended to replace advice given to you by your health care provider. Make sure you discuss any questions you have with your health care provider. Document Released: 09/02/2009 Document Revised: 11/13/2015 Document Reviewed: 03/11/2014 Elsevier Interactive Patient Education  [employer].

## 2017-08-12 NOTE — Assessment & Plan Note (Signed)
Multiple prosthetic parts in need of repair due to normal wear and tear Rx for requested parts will be sent  

## 2017-08-15 ENCOUNTER — Other Ambulatory Visit: Payer: Self-pay

## 2017-08-15 DIAGNOSIS — S58112S Complete traumatic amputation at level between elbow and wrist, left arm, sequela: Secondary | ICD-10-CM

## 2017-08-15 NOTE — Telephone Encounter (Signed)
Rx for prosthetics has been faxed over to [facility] with all proper office notes.

## 2017-08-17 ENCOUNTER — Telehealth: Payer: Self-pay | Admitting: Nurse Practitioner

## 2017-08-17 NOTE — Telephone Encounter (Signed)
Copied from [place] [id number]. Topic: Quick Communication - See Telephone Encounter >> Aug 17, 2017  3:28 PM Clack, Laban Emperor wrote: CRM for notification. See Telephone encounter for: 08/17/17.  Clarene Critchley calling from [facility] for an update on a fax sent over on May 7, about a Rx.  Please f/u [phone]

## 2017-08-18 NOTE — Telephone Encounter (Signed)
Called Clarene Critchley back and let her know that I will refax rx and office notes over. She understood.

## 2017-08-23 ENCOUNTER — Ambulatory Visit: Payer: 59 | Admitting: Family

## 2017-08-23 ENCOUNTER — Encounter: Payer: Self-pay | Admitting: Family

## 2017-08-23 VITALS — BP 116/78 | HR 79 | Temp 98.4°F | Ht 71.0 in | Wt 203.1 lb

## 2017-08-23 DIAGNOSIS — L237 Allergic contact dermatitis due to plants, except food: Secondary | ICD-10-CM

## 2017-08-23 MED ORDER — HYDROXYZINE HCL 10 MG PO TABS: 10.0000 mg | ORAL_TABLET | Freq: Three times a day (TID) | ORAL | 0 refills | Status: DC | PRN

## 2017-08-23 MED ORDER — METHYLPREDNISOLONE 4 MG PO TBPK: ORAL_TABLET | ORAL | 0 refills | Status: DC

## 2017-08-23 MED ORDER — DOXYCYCLINE HYCLATE 100 MG PO TABS: 100.0000 mg | ORAL_TABLET | Freq: Two times a day (BID) | ORAL | 0 refills | Status: DC

## 2017-08-23 MED ORDER — METHYLPREDNISOLONE ACETATE 80 MG/ML IJ SUSP
80.0000 mg | Freq: Once | INTRAMUSCULAR | Status: AC
  Administered 2017-08-23: 80 mg via INTRAMUSCULAR

## 2017-08-23 NOTE — Progress Notes (Signed)
Isaac Patel is a 52 y.o. male with the following history as recorded in Epic[employer]:  Patient Active Problem List   Diagnosis Date Noted  . Fatty liver 07/20/2017  . Abdominal bloating 07/20/2017  . Left elbow pain 03/10/2017  . GERD with esophagitis 06/08/2016  . Essential hypertension 06/08/2016  . Chest pain with moderate risk for cardiac etiology 05/24/2016  . 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 Anxiety state 11/15/2014  . CAD (coronary artery disease) 10/30/2014  . Tobacco abuse 10/30/2014  . HLD (hyperlipidemia) 10/30/2014  . History of non-ST elevation myocardial infarction (NSTEMI) 10/28/2014  . Routine general medical examination at a health care facility 05/29/2014  . OSA (obstructive sleep apnea) 05/08/2014  . 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 aspirin 81 MG tablet Take 1 tablet (81 mg total) by mouth daily. 30 tablet   . atorvastatin (LIPITOR) 80 MG tablet Take 1 tablet (80 mg total) by mouth daily at 6 PM. 90 tablet 3  . clopidogrel (PLAVIX) 75 MG tablet Take 1 tablet (75 mg total) by mouth daily. 90 tablet 3  . diclofenac sodium (VOLTAREN) 1 % GEL Apply 2 g topically 4 (four) times daily. 100 g 0  . dicyclomine (BENTYL) 10 MG capsule Take 1 capsule (10 mg total) by mouth 4 (four) times daily as needed for spasms. For abdominal pain and bloating 120 capsule 5  . fluticasone (FLONASE) 50 MCG/ACT nasal spray Place 2 sprays into both nostrils daily as needed for allergies or rhinitis.     Marland Kitchen lisinopril (PRINIVIL,ZESTRIL) 10 MG tablet Take 1 tablet (10 mg total) by mouth daily. 90 tablet 3  . loratadine (CLARITIN) 10 MG tablet Take 10 mg by mouth daily.    . Multiple Vitamin (MULTI-VITAMIN PO) Take 1 tablet by mouth daily.    . nitroGLYCERIN (NITROSTAT) 0.4 MG SL tablet Place 1 tablet (0.4 mg total) under the tongue every 5 (five) minutes x 3 doses as needed for chest pain. 25 tablet 12  .  nystatin ointment (MYCOSTATIN) Apply 1 application topically daily as needed (arm rash).    . pantoprazole (PROTONIX) 40 MG tablet Take 1 tablet (40 mg total) by mouth daily. 90 tablet 0  . doxycycline (VIBRA-TABS) 100 MG tablet Take 1 tablet (100 mg total) by mouth 2 (two) times daily. 20 tablet 0  . hydrOXYzine (ATARAX/VISTARIL) 10 MG tablet Take 1 tablet (10 mg total) by mouth 3 (three) times daily as needed. 30 tablet 0  . methylPREDNISolone (MEDROL DOSEPAK) 4 MG TBPK tablet Taper as directed 21 tablet 0   No current facility-administered medications for this visit.     Allergies: Morphine and related  Past Medical History:  Diagnosis Date  . CAD (coronary artery disease)    a.  Non-STEMI 8/16: LHC-proximal LAD 80% treated with a resolute DES, EF 50-55%;  b.  Echo 8/16:  Moderate LVH, EF 55-60%, normal wall motion, normal diastolic function, normal RV function  . Depression    hx  . GERD (gastroesophageal reflux disease)   . Headache    "maybe monthly" (10/28/2014)  . HLD (hyperlipidemia)   . NSTEMI (non-ST elevated myocardial infarction) ([place]) 10/28/2014  . OSA (obstructive sleep apnea)    "tried mask; wear it off and on" (10/28/2014)    Past Surgical History:  Procedure Laterality Date  . 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 CARDIAC CATHETERIZATION N/A 10/29/2014  Procedure: Left Heart Cath and Coronary Angiography;  Surgeon: Troy Sine, MD;  Location: [facility];  Service: Cardiovascular;  Laterality: N/A;  . CHOLESTEATOMA EXCISION Right 1990's  . LEFT HEART CATH AND CORONARY ANGIOGRAPHY N/A 05/25/2016   Procedure: Left Heart Cath and Coronary Angiography;  Surgeon: Sherren Mocha, MD;  Location: [facility];  Service: Cardiovascular;  Laterality: N/A;  . TYMPANOSTOMY TUBE PLACEMENT Bilateral "as a kid"   "had one done as an adult too"    Family History  Problem Relation Age of Onset  . Diverticulitis Mother   . Skin cancer Mother   .  Healthy Father   . Bradycardia Father   . Multiple sclerosis Maternal Grandfather   . Colon cancer Neg Hx   . Stomach cancer Neg Hx   . Rectal cancer Neg Hx   . Liver cancer Neg Hx   . Esophageal cancer Neg Hx     Social History   Tobacco Use  . Smoking status: Current Some Day Smoker    Packs/day: 0.33    Years: 32.00    Pack years: 10.56    Types: Cigarettes  . Smokeless tobacco: Never Used  Substance Use Topics  . Alcohol use: No    Alcohol/week: 1.2 - 1.8 oz    Types: 2 - 3 Standard drinks or equivalent per week    Frequency: Never    Comment: stopped 06/19/2015,     Subjective:  Patient presents with concerns for exposure to poison ivy; was doing yard work last weekend and rash is progressively worsening; not responding to OTC medications; worried about secondary infection especially due to history of left arm amputation; rash started on his hands and is now extensively spreading over his legs (bilateral) and trunk; back; not sleeping well at night due to itching;   Objective:  Vitals:   08/23/17 0817  BP: 116/78  Pulse: 79  Temp: 98.4 F (36.9 C)  TempSrc: Oral  SpO2: 99%  Weight: 203 lb 1.3 oz (92.1 kg)  Height: 5\' 11"  (1.803 m)    General: Well developed, well nourished, in no acute distress  Skin : Warm and dry. Linear erythematous rash noted over trunk, bilateral legs and back;  Head: Normocephalic and atraumatic  Lungs: Respirations unlabored; clear to auscultation bilaterally without wheeze, rales, rhonchi  Neurologic: Alert and oriented; speech intact; face symmetrical; moves all extremities well; CNII-XII intact without focal deficit  Assessment:  1. Allergic contact dermatitis due to plants, except food     Plan:  Depo-Medrol IM 80 mg given in office; start Medrol Dose Pak tomorrow; Rx for Atarax 10 mg tid prn to help with itching; will cover for potential infection with Doxycycline 100 mg bid x 10 days; follow-up worse, no better.   No follow-ups on  file.  No orders of the defined types were placed in this encounter.   Requested Prescriptions   Signed Prescriptions Disp Refills  . methylPREDNISolone (MEDROL DOSEPAK) 4 MG TBPK tablet 21 tablet 0    Sig: Taper as directed  . hydrOXYzine (ATARAX/VISTARIL) 10 MG tablet 30 tablet 0    Sig: Take 1 tablet (10 mg total) by mouth 3 (three) times daily as needed.  . doxycycline (VIBRA-TABS) 100 MG tablet 20 tablet 0    Sig: Take 1 tablet (100 mg total) by mouth 2 (two) times daily.

## 2017-08-24 NOTE — Telephone Encounter (Signed)
Isaac Patel with [facility] called back and would like to talk to Delice Bison about a fax that she still has not got from her. Please call her back [phone].

## 2017-08-25 NOTE — Telephone Encounter (Signed)
Spoke with Isaac Patel and advised that I faxed over everything again. She stated she got half of my notes. I re faxed everything again.

## 2017-09-21 ENCOUNTER — Ambulatory Visit (INDEPENDENT_AMBULATORY_CARE_PROVIDER_SITE_OTHER): Payer: 59 | Admitting: Nurse Practitioner

## 2017-09-21 ENCOUNTER — Other Ambulatory Visit (INDEPENDENT_AMBULATORY_CARE_PROVIDER_SITE_OTHER): Payer: 59

## 2017-09-21 ENCOUNTER — Encounter: Payer: Self-pay | Admitting: Nurse Practitioner

## 2017-09-21 VITALS — BP 124/70 | HR 90 | Temp 98.1°F | Resp 16 | Ht 71.0 in | Wt 208.0 lb

## 2017-09-21 DIAGNOSIS — H7191 Unspecified cholesteatoma, right ear: Secondary | ICD-10-CM

## 2017-09-21 DIAGNOSIS — I10 Essential (primary) hypertension: Secondary | ICD-10-CM

## 2017-09-21 DIAGNOSIS — K2100 Gastro-esophageal reflux disease with esophagitis, without bleeding: Secondary | ICD-10-CM

## 2017-09-21 DIAGNOSIS — R350 Frequency of micturition: Secondary | ICD-10-CM | POA: Diagnosis not present

## 2017-09-21 DIAGNOSIS — Z0001 Encounter for general adult medical examination with abnormal findings: Secondary | ICD-10-CM

## 2017-09-21 DIAGNOSIS — R7309 Other abnormal glucose: Secondary | ICD-10-CM

## 2017-09-21 DIAGNOSIS — E785 Hyperlipidemia, unspecified: Secondary | ICD-10-CM | POA: Diagnosis not present

## 2017-09-21 DIAGNOSIS — Z125 Encounter for screening for malignant neoplasm of prostate: Secondary | ICD-10-CM

## 2017-09-21 DIAGNOSIS — Z23 Encounter for immunization: Secondary | ICD-10-CM | POA: Diagnosis not present

## 2017-09-21 DIAGNOSIS — H6121 Impacted cerumen, right ear: Secondary | ICD-10-CM | POA: Diagnosis not present

## 2017-09-21 DIAGNOSIS — Z1211 Encounter for screening for malignant neoplasm of colon: Secondary | ICD-10-CM

## 2017-09-21 DIAGNOSIS — H6123 Impacted cerumen, bilateral: Secondary | ICD-10-CM

## 2017-09-21 LAB — COMPREHENSIVE METABOLIC PANEL
ALK PHOS: 72 U/L (ref 39–117)
ALT: 19 U/L (ref 0–53)
AST: 16 U/L (ref 0–37)
Albumin: 4.2 g/dL (ref 3.5–5.2)
BILIRUBIN TOTAL: 0.6 mg/dL (ref 0.2–1.2)
BUN: 15 mg/dL (ref 6–23)
CALCIUM: 9.2 mg/dL (ref 8.4–10.5)
CO2: 26 meq/L (ref 19–32)
Chloride: 107 mEq/L (ref 96–112)
Creatinine, Ser: 0.98 mg/dL (ref 0.40–1.50)
GFR: 85.11 mL/min (ref >60.00)
Glucose, Bld: 98 mg/dL (ref 70–99)
Potassium: 4 mEq/L (ref 3.5–5.1)
Sodium: 140 mEq/L (ref 135–145)
TOTAL PROTEIN: 6.4 g/dL (ref 6.0–8.3)

## 2017-09-21 LAB — CBC
HCT: 45.8 % (ref 39.0–52.0)
Hemoglobin: 15.8 g/dL (ref 13.0–17.0)
MCHC: 34.6 g/dL (ref 30.0–36.0)
MCV: 92.7 fl (ref 78.0–100.0)
PLATELETS: 225 10*3/uL (ref 150.0–400.0)
RBC: 4.94 Mil/uL (ref 4.22–5.81)
RDW: 13.3 % (ref 11.5–15.5)
WBC: 7.2 10*3/uL (ref 4.0–10.5)

## 2017-09-21 LAB — URINALYSIS
Bilirubin Urine: NEGATIVE
Ketones, ur: NEGATIVE
Leukocytes, UA: NEGATIVE
NITRITE: NEGATIVE
Specific Gravity, Urine: 1.015 (ref 1.000–1.030)
Total Protein, Urine: NEGATIVE
UROBILINOGEN UA: 0.2 (ref 0.0–1.0)
Urine Glucose: NEGATIVE
pH: 6 (ref 5.0–8.0)

## 2017-09-21 LAB — MAGNESIUM: Magnesium: 2.2 mg/dL (ref 1.5–2.5)

## 2017-09-21 LAB — LIPID PANEL
CHOL/HDL RATIO: 3
Cholesterol: 124 mg/dL (ref 0–200)
HDL: 35.9 mg/dL — AB (ref >39.00)
LDL Cholesterol: 58 mg/dL (ref 0–99)
NonHDL: 88.43
TRIGLYCERIDES: 151 mg/dL — AB (ref 0.0–149.0)
VLDL: 30.2 mg/dL (ref 0.0–40.0)

## 2017-09-21 LAB — TSH: TSH: 1.18 u[IU]/mL (ref 0.35–4.50)

## 2017-09-21 LAB — HEMOGLOBIN A1C: Hgb A1c MFr Bld: 6.1 % (ref 4.6–6.5)

## 2017-09-21 LAB — PSA: PSA: 1.01 ng/mL (ref 0.10–4.00)

## 2017-09-21 NOTE — Assessment & Plan Note (Signed)
-  Prostate cancer screening and PSA options (with potential risks and benefits of testing vs not testing) were discussed along with recent recs/guidelines. -USPSTF grade A and B recommendations reviewed with patient; age-appropriate recommendations, preventive care, screening tests, etc discussed and encouraged; healthy living, sunscreen use, smoking cessation encouraged; see AVS for patient education given to patient -Discussed importance of 150 minutes of physical activity weekly, eat 6 servings of fruit/vegetables daily and drink plenty of water and avoid sweet beverages.  -Follow up and care instructions discussed and provided in AVS.   -Reviewed Health Maintenance:  Need for Tdap vaccination- Tdap vaccine greater than or equal to 7yo IM - PSA; Future- Screening for prostate cancer - Ambulatory referral to Gastroenterology- Screening for colon cancer  Elevated glucose - Hemoglobin A1c; Future - Urinalysis; Future  Urinary frequency - Urinalysis;

## 2017-09-21 NOTE — Assessment & Plan Note (Signed)
Does experience GERD symptoms if he does not take protonix daily, but no symptoms with daily medication compliance Continue protonix at current dosage Update labs F/U for new, worsening symptoms - Magnesium; Future

## 2017-09-21 NOTE — Progress Notes (Signed)
Name: Isaac Patel   MRN: [id number]    DOB: [birth year]   Date:09/21/2017       Progress Note  Subjective  Chief Complaint  Chief Complaint  Patient presents with  . CPE    HPI  Patient presents for annual CPE.  USPSTF grade A and B recommendations:  Diet, Exercise: eats home cooked meals, rare fast foot, some sweets, vegetables daily; very active around house   Depression: denies concerns Depression screen [facility] 2/9 03/03/2015 12/09/2014  Decreased Interest 0 0  Down, Depressed, Hopeless 0 0  PHQ - 2 Score 0 0   Hypertension: maintained on lisinopril 10 daily by cardiology  BP Readings from Last 3 Encounters:  09/21/17 124/70  08/23/17 116/78  08/12/17 118/80   Obesity: Wt Readings from Last 3 Encounters:  09/21/17 208 lb (94.3 kg)  08/23/17 203 lb 1.3 oz (92.1 kg)  08/12/17 204 lb (92.5 kg)   BMI Readings from Last 3 Encounters:  09/21/17 29.01 kg/m  08/23/17 28.32 kg/m  08/12/17 28.45 kg/m    Lipids: maintained on atorvastatin 80 daily by cardiology  Lab Results  Component Value Date   CHOL 113 06/09/2016   CHOL 119 (L) 03/13/2015   CHOL 166 10/29/2014   Lab Results  Component Value Date   HDL 36.80 (L) 06/09/2016   HDL 56 03/13/2015   HDL 44 10/29/2014   Lab Results  Component Value Date   LDLCALC 58 06/09/2016   LDLCALC 51 03/13/2015   LDLCALC 112 (H) 10/29/2014   Lab Results  Component Value Date   TRIG 88.0 06/09/2016   TRIG 62 03/13/2015   TRIG 51 10/29/2014   Lab Results  Component Value Date   CHOLHDL 3 06/09/2016   CHOLHDL 2.1 03/13/2015   CHOLHDL 3.8 10/29/2014   No results found for: LDLDIRECT  Glucose: A1c today Glucose, Bld  Date Value Ref Range Status  07/14/2016 119 (H) 65 - 99 mg/dL Final  06/09/2016 104 (H) 70 - 99 mg/dL Final  05/25/2016 106 (H) 65 - 99 mg/dL Final   Alcohol: no, former- quit 2 years ago Tobacco use: current smoker, 1/2 ppd, thinking of quitting, checking into work programs for smoking  cessation  STD testing and prevention (chl/gon/syphilis): no concerns, monogamous with wife, declines HIV screening: declines  Skin cancer: does not routinely wear sunscreen  Colorectal cancer: No personal or family history of colon ca, occassionally experiences abdominal pain and bloating. No rectal bleeding.   Prostate cancer: Reports occasional weak urinary stream, urinary leakage, dysuria. Denies fevers, hematuria, nocturia. Will check PSA today. No results found for: PSA   Aspirin: taking 81 daily ECG: not indicated   Vaccinations: up to date  Advanced Care Planning: A voluntary discussion about advance care planning including the explanation and discussion of advance directives.  Discussed health care proxy and Living will, and the patient DOES  have a living will at present time. If patient does have living will, I have requested they bring this to the clinic to be scanned in to their chart.  Patient Active Problem List   Diagnosis Date Noted  . Fatty liver 07/20/2017  . Abdominal bloating 07/20/2017  . Left elbow pain 03/10/2017  . GERD with esophagitis 06/08/2016  . Essential hypertension 06/08/2016  . Chest pain with moderate risk for cardiac etiology 05/24/2016  . 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 Anxiety state 11/15/2014  . CAD (coronary artery disease) 10/30/2014  . Tobacco abuse 10/30/2014  . HLD (hyperlipidemia) 10/30/2014  .  History of non-ST elevation myocardial infarction (NSTEMI) 10/28/2014  . Routine general medical examination at a health care facility 05/29/2014  . OSA (obstructive sleep apnea) 05/08/2014  . 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 CARDIAC CATHETERIZATION N/A 10/29/2014   Procedure: Left Heart Cath and Coronary Angiography;  Surgeon: Troy Sine, MD;  Location: [facility];  Service: Cardiovascular;   Laterality: N/A;  . CHOLESTEATOMA EXCISION Right 1990's  . LEFT HEART CATH AND CORONARY ANGIOGRAPHY N/A 05/25/2016   Procedure: Left Heart Cath and Coronary Angiography;  Surgeon: Sherren Mocha, MD;  Location: [facility];  Service: Cardiovascular;  Laterality: N/A;  . TYMPANOSTOMY TUBE PLACEMENT Bilateral "as a kid"   "had one done as an adult too"    Family History  Problem Relation Age of Onset  . Diverticulitis Mother   . Skin cancer Mother   . Healthy Father   . Bradycardia Father   . Multiple sclerosis Maternal Grandfather   . Colon cancer Neg Hx   . Stomach cancer Neg Hx   . Rectal cancer Neg Hx   . Liver cancer Neg Hx   . Esophageal cancer Neg Hx     Social History   Socioeconomic History  . Marital status: Married    Spouse name: Not on file  . Number of children: 2  . Years of education: 102  . Highest education level: Not on file  Occupational History  . Occupation: CMA  Social Needs  . Financial resource strain: Not on file  . Food insecurity:    Worry: Not on file    Inability: Not on file  . Transportation needs:    Medical: Not on file    Non-medical: Not on file  Tobacco Use  . Smoking status: Current Some Day Smoker    Packs/day: 0.33    Years: 32.00    Pack years: 10.56    Types: Cigarettes  . Smokeless tobacco: Never Used  Substance and Sexual Activity  . Alcohol use: No    Alcohol/week: 1.2 - 1.8 oz    Types: 2 - 3 Standard drinks or equivalent per week    Frequency: Never    Comment: stopped 06/19/2015,   . Drug use: Yes    Types: Marijuana    Comment: 05/24/2016  "stopped marijuana in the early 2000's"  . Sexual activity: Yes  Lifestyle  . Physical activity:    Days per week: Not on file    Minutes per session: Not on file  . Stress: Not on file  Relationships  . Social connections:    Talks on phone: Not on file    Gets together: Not on file    Attends religious service: Not on file    Active member of club or organization:  Not on file    Attends meetings of clubs or organizations: Not on file    Relationship status: Not on file  . Intimate partner violence:    Fear of current or ex partner: Not on file    Emotionally abused: Not on file    Physically abused: Not on file    Forced sexual activity: Not on file  Other Topics Concern  . Not on file  Social History Narrative   Born and raised in [place], [place].  Currently resides in a house with his wife and children. 1 dog. Fun: play  computer games, sports.    Denies any religious beliefs effecting health care.    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, [address]     Current Outpatient Medications:  .  aspirin 81 MG tablet, Take 1 tablet (81 mg total) by mouth daily., Disp: 30 tablet, Rfl:  .  atorvastatin (LIPITOR) 80 MG tablet, Take 1 tablet (80 mg total) by mouth daily at 6 PM., Disp: 90 tablet, Rfl: 3 .  clopidogrel (PLAVIX) 75 MG tablet, Take 1 tablet (75 mg total) by mouth daily., Disp: 90 tablet, Rfl: 3 .  diclofenac sodium (VOLTAREN) 1 % GEL, Apply 2 g topically 4 (four) times daily., Disp: 100 g, Rfl: 0 .  dicyclomine (BENTYL) 10 MG capsule, Take 1 capsule (10 mg total) by mouth 4 (four) times daily as needed for spasms. For abdominal pain and bloating, Disp: 120 capsule, Rfl: 5 .  fluticasone (FLONASE) 50 MCG/ACT nasal spray, Place 2 sprays into both nostrils daily as needed for allergies or rhinitis. , Disp: , Rfl:  .  lisinopril (PRINIVIL,ZESTRIL) 10 MG tablet, Take 1 tablet (10 mg total) by mouth daily., Disp: 90 tablet, Rfl: 3 .  loratadine (CLARITIN) 10 MG tablet, Take 10 mg by mouth daily., Disp: , Rfl:  .  Multiple Vitamin (MULTI-VITAMIN PO), Take 1 tablet by mouth daily., Disp: , Rfl:  .  nitroGLYCERIN (NITROSTAT) 0.4 MG SL tablet, Place 1 tablet (0.4 mg total) under the tongue every 5 (five) minutes x 3 doses as needed for chest pain., Disp: 25 tablet, Rfl: 12 .  nystatin ointment (MYCOSTATIN), Apply 1  application topically daily as needed (arm rash)., Disp: , Rfl:  .  pantoprazole (PROTONIX) 40 MG tablet, Take 1 tablet (40 mg total) by mouth daily., Disp: 90 tablet, Rfl: 0  Allergies  Allergen Reactions  . Morphine And Related Itching     ROS  Constitutional: Negative for fever or weight change.  Respiratory: Negative for cough and shortness of breath.   Cardiovascular: Negative for chest pain or palpitations.  Gastrointestinal: Negative for bowel changes.  Musculoskeletal: Negative for gait problem or joint swelling.  Skin: Negative for rash.  Neurological: Negative for dizziness or headache.  No other specific complaints in a complete review of systems (except as listed in HPI above).   Objective  Vitals:   09/21/17 0859  BP: 124/70  Pulse: 90  Resp: 16  Temp: 98.1 F (36.7 C)  TempSrc: Oral  SpO2: 97%  Weight: 208 lb (94.3 kg)  Height: 5\' 11"  (1.803 m)    Body mass index is 29.01 kg/m.  Physical Exam Vital signs reviewed. Constitutional: Patient appears well-developed and well-nourished. No distress.  HENT: Head: Normocephalic and atraumatic. Ears: B TMs without erythema or effusion, cerumen build-up partially obstructing bilateral ear canals ; Nose: Nose normal. Mouth/Throat: Oropharynx is clear and moist. No oropharyngeal exudate.  Eyes: Conjunctivae and EOM are normal. Pupils are equal, round, and reactive to light. No scleral icterus.  Neck: Normal range of motion. Neck supple. No cervical adenopathy. No thyromegaly present.  Cardiovascular: Normal rate, regular rhythm and normal heart sounds.  No murmur heard. No BLE edema. Distal pulses intact Pulmonary/Chest: Effort normal and breath sounds normal. No respiratory distress. Abdominal: Soft. Bowel sounds are normal, no distension. There is no tenderness. no masses Musculoskeletal: Normal range of motion, no joint effusions. Left arm- below elbow amputation with prosthesis Neurological: He is alert and  oriented to person,  place, and time. No cranial nerve deficit. Coordination, balance, strength, speech and gait are normal.  Skin: Skin is warm and dry. No rash noted. No erythema.  Psychiatric: Patient has a normal mood and affect. behavior is normal. Judgment and thought content normal.  Assessment & Plan RTC in 1 year for CPE

## 2017-09-21 NOTE — Assessment & Plan Note (Signed)
Attempted irrigation/lavage of bilateral ears today, patient was unable to tolerate and only the right ear was irrigated with partial removal of cerumen Due to history of cholesteatoma and recurrent ear wax impactions, referral to ENT was placed for further evaluation and management and he is agreeable - Ambulatory referral to ENT

## 2017-09-21 NOTE — Assessment & Plan Note (Signed)
Stable Continue current medications Update labs Continue routine F/U with cardiology - CBC; Future - Comprehensive metabolic panel; Future - TSH; Future

## 2017-09-21 NOTE — Assessment & Plan Note (Signed)
Continue current medications Update labs today- he is not fasting, he had one cup coffee with creamer this am Continue routine F/U with cardiology - CBC; Future - Comprehensive metabolic panel; Future - Lipid panel; Future - TSH; Future

## 2017-09-21 NOTE — Patient Instructions (Addendum)
Please head downstairs for lab work/x-rays. If any of your test results are critically abnormal, you will be contacted right away. Otherwise, I will contact you within a week about your test results and any recommendations for abnormalities.  I have placed a referral to gastroenterology for your screening colonoscopy and ENT for your ears. Our office will call you to schedule this appointment. You should hear from our office in 7-10 days.  I will plan to see you back in 1 year for your annual physical, or sooner if needed.   Health Maintenance, Male A healthy lifestyle and preventive care is important for your health and wellness. Ask your health care provider about what schedule of regular examinations is right for you. What should I know about weight and diet? Eat a Healthy Diet  Eat plenty of vegetables, fruits, whole grains, low-fat dairy products, and lean protein.  Do not eat a lot of foods high in solid fats, added sugars, or salt.  Maintain a Healthy Weight Regular exercise can help you achieve or maintain a healthy weight. You should:  Do at least 150 minutes of exercise each week. The exercise should increase your heart rate and make you sweat (moderate-intensity exercise).  Do strength-training exercises at least twice a week.  Watch Your Levels of Cholesterol and Blood Lipids  Have your blood tested for lipids and cholesterol every 5 years starting at 53 years of age. If you are at high risk for heart disease, you should start having your blood tested when you are 53 years old. You may need to have your cholesterol levels checked more often if: ? Your lipid or cholesterol levels are high. ? You are older than 53 years of age. ? You are at high risk for heart disease.  What should I know about cancer screening? Many types of cancers can be detected early and may often be prevented. Lung Cancer  You should be screened every year for lung cancer if: ? You are a current  smoker who has smoked for at least 30 years. ? You are a former smoker who has quit within the past 15 years.  Talk to your health care provider about your screening options, when you should start screening, and how often you should be screened.  Colorectal Cancer  Routine colorectal cancer screening usually begins at 53 years of age and should be repeated every 5-10 years until you are 53 years old. You may need to be screened more often if early forms of precancerous polyps or small growths are found. Your health care provider may recommend screening at an earlier age if you have risk factors for colon cancer.  Your health care provider may recommend using home test kits to check for hidden blood in the stool.  A small camera at the end of a tube can be used to examine your colon (sigmoidoscopy or colonoscopy). This checks for the earliest forms of colorectal cancer.  Prostate and Testicular Cancer  Depending on your age and overall health, your health care provider may do certain tests to screen for prostate and testicular cancer.  Talk to your health care provider about any symptoms or concerns you have about testicular or prostate cancer.  Skin Cancer  Check your skin from head to toe regularly.  Tell your health care provider about any new moles or changes in moles, especially if: ? There is a change in a mole's size, shape, or color. ? You have a mole that is larger than  a pencil eraser.  Always use sunscreen. Apply sunscreen liberally and repeat throughout the day.  Protect yourself by wearing long sleeves, pants, a wide-brimmed hat, and sunglasses when outside.  What should I know about heart disease, diabetes, and high blood pressure?  If you are 41-6 years of age, have your blood pressure checked every 3-5 years. If you are 35 years of age or older, have your blood pressure checked every year. You should have your blood pressure measured twice-once when you are at a  hospital or clinic, and once when you are not at a hospital or clinic. Record the average of the two measurements. To check your blood pressure when you are not at a hospital or clinic, you can use: ? An automated blood pressure machine at a pharmacy. ? A home blood pressure monitor.  Talk to your health care provider about your target blood pressure.  If you are between 41-13 years old, ask your health care provider if you should take aspirin to prevent heart disease.  Have regular diabetes screenings by checking your fasting blood sugar level. ? If you are at a normal weight and have a low risk for diabetes, have this test once every three years after the age of 34. ? If you are overweight and have a high risk for diabetes, consider being tested at a younger age or more often.  A one-time screening for abdominal aortic aneurysm (AAA) by ultrasound is recommended for men aged 83-75 years who are current or former smokers. What should I know about preventing infection? Hepatitis B If you have a higher risk for hepatitis B, you should be screened for this virus. Talk with your health care provider to find out if you are at risk for hepatitis B infection. Hepatitis C Blood testing is recommended for:  Everyone born from [birth year] through 1966.  Anyone with known risk factors for hepatitis C.  Sexually Transmitted Diseases (STDs)  You should be screened each year for STDs including gonorrhea and chlamydia if: ? You are sexually active and are younger than 53 years of age. ? You are older than 52 years of age and your health care provider tells you that you are at risk for this type of infection. ? Your sexual activity has changed since you were last screened and you are at an increased risk for chlamydia or gonorrhea. Ask your health care provider if you are at risk.  Talk with your health care provider about whether you are at high risk of being infected with HIV. Your health care provider may  recommend a prescription medicine to help prevent HIV infection.  What else can I do?  Schedule regular health, dental, and eye exams.  Stay current with your vaccines (immunizations).  Do not use any tobacco products, such as cigarettes, chewing tobacco, and e-cigarettes. If you need help quitting, ask your health care provider.  Limit alcohol intake to no more than 2 drinks per day. One drink equals 12 ounces of beer, 5 ounces of wine, or 1 ounces of hard liquor.  Do not use street drugs.  Do not share needles.  Ask your health care provider for help if you need support or information about quitting drugs.  Tell your health care provider if you often feel depressed.  Tell your health care provider if you have ever been abused or do not feel safe at home. This information is not intended to replace advice given to you by your health care provider. Make  sure you discuss any questions you have with your health care provider. Document Released: 09/11/2007 Document Revised: 11/12/2015 Document Reviewed: 12/17/2014 Elsevier Interactive Patient Education  [employer].

## 2017-09-26 ENCOUNTER — Other Ambulatory Visit: Payer: Self-pay

## 2017-09-26 DIAGNOSIS — S58112S Complete traumatic amputation at level between elbow and wrist, left arm, sequela: Secondary | ICD-10-CM

## 2017-09-27 ENCOUNTER — Other Ambulatory Visit: Payer: Self-pay | Admitting: Family

## 2017-09-28 ENCOUNTER — Other Ambulatory Visit: Payer: Self-pay | Admitting: Nurse Practitioner

## 2017-09-28 DIAGNOSIS — R319 Hematuria, unspecified: Secondary | ICD-10-CM

## 2017-10-04 ENCOUNTER — Other Ambulatory Visit (INDEPENDENT_AMBULATORY_CARE_PROVIDER_SITE_OTHER): Payer: 59

## 2017-10-04 DIAGNOSIS — R319 Hematuria, unspecified: Secondary | ICD-10-CM | POA: Diagnosis not present

## 2017-10-04 LAB — URINALYSIS, ROUTINE W REFLEX MICROSCOPIC
BILIRUBIN URINE: NEGATIVE
KETONES UR: NEGATIVE
Leukocytes, UA: NEGATIVE
Nitrite: NEGATIVE
PH: 6 (ref 5.0–8.0)
TOTAL PROTEIN, URINE-UPE24: NEGATIVE
URINE GLUCOSE: NEGATIVE
Urobilinogen, UA: 0.2 (ref 0.0–1.0)
WBC, UA: NONE SEEN (ref 0–?)

## 2017-10-10 ENCOUNTER — Encounter: Payer: Self-pay | Admitting: Nurse Practitioner

## 2017-10-10 ENCOUNTER — Other Ambulatory Visit: Payer: Self-pay | Admitting: Nurse Practitioner

## 2017-10-10 DIAGNOSIS — R7303 Prediabetes: Secondary | ICD-10-CM

## 2017-10-10 DIAGNOSIS — R3989 Other symptoms and signs involving the genitourinary system: Secondary | ICD-10-CM

## 2017-10-10 MED ORDER — TAMSULOSIN HCL 0.4 MG PO CAPS
0.4000 mg | ORAL_CAPSULE | Freq: Every day | ORAL | 3 refills | Status: DC
Start: 2017-10-10 — End: 2018-06-16

## 2017-10-17 ENCOUNTER — Telehealth: Payer: Self-pay

## 2017-10-17 NOTE — Telephone Encounter (Signed)
Copied from [place] [id number]. Topic: Inquiry >> Oct 14, 2017  3:42 PM Oliver Pila B wrote: Reason for CRM: [facility] called about documentation that were faxed but have not received a return on those; call [phone] Fax: [phone]

## 2017-10-17 NOTE — Telephone Encounter (Signed)
Called Isaac Patel from [employer] and let her know that Isaac Patel has not been in the office since 10/10/17 and will not return until 10/19/17. I let her know that I will fax over forms as soon as Isaac Patel is able to sign them.

## 2017-10-21 ENCOUNTER — Other Ambulatory Visit: Payer: Self-pay | Admitting: Nurse Practitioner

## 2017-10-24 ENCOUNTER — Other Ambulatory Visit: Payer: Self-pay | Admitting: Nurse Practitioner

## 2017-10-24 DIAGNOSIS — R8290 Unspecified abnormal findings in urine: Secondary | ICD-10-CM

## 2017-10-24 NOTE — Progress Notes (Signed)
r 

## 2017-10-27 ENCOUNTER — Emergency Department (HOSPITAL_COMMUNITY)
Admission: EM | Admit: 2017-10-27 | Discharge: 2017-10-27 | Disposition: A | Discharge status: Home / Self Care | Payer: 59 | Attending: Emergency Medicine | Admitting: Emergency Medicine

## 2017-10-27 ENCOUNTER — Other Ambulatory Visit: Payer: Self-pay

## 2017-10-27 ENCOUNTER — Emergency Department (HOSPITAL_COMMUNITY): Payer: 59

## 2017-10-27 ENCOUNTER — Encounter (HOSPITAL_COMMUNITY): Payer: Self-pay

## 2017-10-27 DIAGNOSIS — F17210 Nicotine dependence, cigarettes, uncomplicated: Secondary | ICD-10-CM | POA: Diagnosis not present

## 2017-10-27 DIAGNOSIS — R0602 Shortness of breath: Secondary | ICD-10-CM | POA: Diagnosis not present

## 2017-10-27 DIAGNOSIS — Z7982 Long term (current) use of aspirin: Secondary | ICD-10-CM | POA: Insufficient documentation

## 2017-10-27 DIAGNOSIS — I2510 Atherosclerotic heart disease of native coronary artery without angina pectoris: Secondary | ICD-10-CM | POA: Diagnosis not present

## 2017-10-27 DIAGNOSIS — R519 Headache, unspecified: Secondary | ICD-10-CM

## 2017-10-27 DIAGNOSIS — Z79899 Other long term (current) drug therapy: Secondary | ICD-10-CM | POA: Insufficient documentation

## 2017-10-27 DIAGNOSIS — Z7902 Long term (current) use of antithrombotics/antiplatelets: Secondary | ICD-10-CM | POA: Diagnosis not present

## 2017-10-27 DIAGNOSIS — I252 Old myocardial infarction: Secondary | ICD-10-CM | POA: Insufficient documentation

## 2017-10-27 DIAGNOSIS — R079 Chest pain, unspecified: Secondary | ICD-10-CM | POA: Diagnosis not present

## 2017-10-27 DIAGNOSIS — R51 Headache: Secondary | ICD-10-CM | POA: Diagnosis not present

## 2017-10-27 DIAGNOSIS — R0789 Other chest pain: Secondary | ICD-10-CM

## 2017-10-27 LAB — BASIC METABOLIC PANEL
ANION GAP: 8 (ref 5–15)
BUN: 15 mg/dL (ref 6–20)
CALCIUM: 8.9 mg/dL (ref 8.9–10.3)
CO2: 25 mmol/L (ref 22–32)
CREATININE: 0.96 mg/dL (ref 0.61–1.24)
Chloride: 108 mmol/L (ref 98–111)
GFR calc non Af Amer: >60 mL/min (ref >60)
Glucose, Bld: 104 mg/dL — ABNORMAL HIGH (ref 70–99)
Potassium: 3.8 mmol/L (ref 3.5–5.1)
Sodium: 141 mmol/L (ref 135–145)

## 2017-10-27 LAB — I-STAT TROPONIN, ED
TROPONIN I, POC: 0 ng/mL (ref 0.00–0.08)
TROPONIN I, POC: 0 ng/mL (ref 0.00–0.08)

## 2017-10-27 LAB — CBC
HCT: 44.9 % (ref 39.0–52.0)
Hemoglobin: 14.6 g/dL (ref 13.0–17.0)
MCH: 31.3 pg (ref 26.0–34.0)
MCHC: 32.5 g/dL (ref 30.0–36.0)
MCV: 96.4 fL (ref 78.0–100.0)
PLATELETS: 215 10*3/uL (ref 150–400)
RBC: 4.66 MIL/uL (ref 4.22–5.81)
RDW: 12.6 % (ref 11.5–15.5)
WBC: 6.5 10*3/uL (ref 4.0–10.5)

## 2017-10-27 NOTE — ED Notes (Signed)
ED Provider at bedside. 

## 2017-10-27 NOTE — ED Triage Notes (Signed)
Pt states that he has had chest tightness since Tuesday with bloating and nausea. Also c/o headache and generalized aches.

## 2017-10-27 NOTE — ED Provider Notes (Signed)
[facility] EMERGENCY DEPARTMENT Provider Note   CSN: [id number] Arrival date & time: 10/27/17  1813  History   Chief Complaint Chief Complaint  Patient presents with  . Chest Pain    HPI Isaac Patel is a 52 y.o. male.  HPI Patient is a 52 year old male with history of CAD status post DES in 2016, hyperlipidemia, and OSA who presents the emergency department today for evaluation of chest pain and headache.  Reports that over the past 2 or 3 days he has been having tightness throughout his chest.  Has also had tightness in his jaw and tightness/pressure between his temples.  No vision changes.  No vomiting or nausea.  No cough or congestion or fevers or chills.  States it does not feel similar to his prior MI. Has been compliant with home meds. No numbness/weakness. No leg swelling. No history of DVT/PE. Reports increased stress from work and recently being told he may have a small amount of blood in his urine (being followed by PCP and started on flomax 10d ago). No other recent medication changes.   Past Medical History:  Diagnosis Date  . CAD (coronary artery disease)    a.  Non-STEMI 8/16: LHC-proximal LAD 80% treated with a resolute DES, EF 50-55%;  b.  Echo 8/16:  Moderate LVH, EF 55-60%, normal wall motion, normal diastolic function, normal RV function  . Depression    hx  . GERD (gastroesophageal reflux disease)   . Headache    "maybe monthly" (10/28/2014)  . HLD (hyperlipidemia)   . NSTEMI (non-ST elevated myocardial infarction) ([place]) 10/28/2014  . OSA (obstructive sleep apnea)    "tried mask; wear it off and on" (10/28/2014)    Patient Active Problem List   Diagnosis Date Noted  . Fatty liver 07/20/2017  . Abdominal bloating 07/20/2017  . GERD with esophagitis 06/08/2016  . Essential hypertension 06/08/2016  . 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 Anxiety state 11/15/2014  . CAD (coronary artery disease) 10/30/2014  . Tobacco abuse  10/30/2014  . HLD (hyperlipidemia) 10/30/2014  . History of non-ST elevation myocardial infarction (NSTEMI) 10/28/2014  . Encounter for general adult medical examination with abnormal findings 05/29/2014  . OSA (obstructive sleep apnea) 05/08/2014  . 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 CARDIAC CATHETERIZATION N/A 10/29/2014   Procedure: Left Heart Cath and Coronary Angiography;  Surgeon: Troy Sine, MD;  Location: [facility];  Service: Cardiovascular;  Laterality: N/A;  . CHOLESTEATOMA EXCISION Right 1990's  . LEFT HEART CATH AND CORONARY ANGIOGRAPHY N/A 05/25/2016   Procedure: Left Heart Cath and Coronary Angiography;  Surgeon: Sherren Mocha, MD;  Location: [facility];  Service: Cardiovascular;  Laterality: N/A;  . TYMPANOSTOMY TUBE PLACEMENT Bilateral "as a kid"   "had one done as an adult too"        Home Medications    Prior to Admission medications   Medication Sig Start Date End Date Taking? Authorizing Provider  aspirin 81 MG tablet Take 1 tablet (81 mg total) by mouth daily. 02/03/16  Yes Turner, Eber Hong, MD  atorvastatin (LIPITOR) 80 MG tablet Take 1 tablet (80 mg total) by mouth daily at 6 PM. 12/31/16  Yes Weaver, Nicki Reaper T, PA-C  clopidogrel (PLAVIX) 75 MG tablet Take 1 tablet (75 mg total) by mouth daily. 12/31/16 12/31/17 Yes Richardson Dopp  T, PA-C  diclofenac sodium (VOLTAREN) 1 % GEL Apply 2 g topically 4 (four) times daily. Patient taking differently: Apply 2 g topically 4 (four) times daily as needed (pain).  08/12/17  Yes [name], NP  dicyclomine (BENTYL) 10 MG capsule Take 1 capsule (10 mg total) by mouth 4 (four) times daily as needed for spasms. For abdominal pain and bloating 08/26/16  Yes Ladene Artist, MD  fluticasone [facility]) 50 MCG/ACT nasal spray Place 2 sprays into both nostrils daily as needed for allergies or  rhinitis.    Yes [provider]  lisinopril (PRINIVIL,ZESTRIL) 10 MG tablet Take 1 tablet (10 mg total) by mouth daily. 12/31/16  Yes Weaver, Scott T, PA-C  loratadine (CLARITIN) 10 MG tablet Take 10 mg by mouth daily as needed for allergies.    Yes [provider]  Multiple Vitamin (MULTI-VITAMIN PO) Take 1 tablet by mouth daily.   Yes [provider]  nitroGLYCERIN (NITROSTAT) 0.4 MG SL tablet Place 1 tablet (0.4 mg total) under the tongue every 5 (five) minutes x 3 doses as needed for chest pain. 10/30/14  Yes Nation Cradle Canales, PA-C  nystatin ointment (MYCOSTATIN) Apply 1 application topically daily as needed (arm rash).   Yes [provider]  pantoprazole (PROTONIX) 40 MG tablet TAKE 1 TABLET BY MOUTH DAILY. 10/21/17  Yes [name], NP  tamsulosin (FLOMAX) 0.4 MG CAPS capsule Take 1 capsule (0.4 mg total) by mouth daily. 10/10/17  Yes [name], NP    Family History Family History  Problem Relation Age of Onset  . Diverticulitis Mother   . Skin cancer Mother   . Healthy Father   . Bradycardia Father   . Multiple sclerosis Maternal Grandfather   . Colon cancer Neg Hx   . Stomach cancer Neg Hx   . Rectal cancer Neg Hx   . Liver cancer Neg Hx   . Esophageal cancer Neg Hx     Social History Social History   Tobacco Use  . Smoking status: Current Some Day Smoker    Packs/day: 0.33    Years: 32.00    Pack years: 10.56    Types: Cigarettes  . Smokeless tobacco: Never Used  Substance Use Topics  . Alcohol use: No    Alcohol/week: 1.2 - 1.8 oz    Types: 2 - 3 Standard drinks or equivalent per week    Frequency: Never    Comment: stopped 06/19/2015,   . Drug use: Yes    Types: Marijuana    Comment: 05/24/2016  "stopped marijuana in the early 2000's"     Allergies   Morphine and related   Review of Systems Review of Systems  Constitutional: Negative for chills and fever.  HENT: Negative for congestion.        Occasional  jaw pain  Respiratory: Negative for cough and shortness of breath.   Cardiovascular: Positive for chest pain. Negative for leg swelling.  Gastrointestinal: Negative for abdominal pain, diarrhea, nausea and vomiting.  Genitourinary: Negative for dysuria and hematuria.  Musculoskeletal: Negative for back pain and neck pain.  Skin: Negative for rash.  Neurological: Positive for headaches. Negative for weakness and numbness.    Physical Exam Updated Vital Signs BP 104/79   Pulse (!) 54   Temp 97.7 F (36.5 C) (Oral)   Resp 15   Ht 5\' 11"  (1.803 m)   Wt 95.3 kg (210 lb)   SpO2 97%   BMI 29.29 kg/m   Physical  Exam  Constitutional: He appears well-developed and well-nourished.  Non-toxic appearance. He does not appear ill. No distress.  HENT:  Head: Normocephalic and atraumatic.  Eyes: Conjunctivae are normal. Right eye exhibits no discharge. Left eye exhibits no discharge.  Neck: Normal range of motion. No tracheal deviation present.  Cardiovascular: Regular rhythm, normal heart sounds and intact distal pulses.  Pulses:      Radial pulses are 2+ on the right side.  Pulmonary/Chest: Effort normal and breath sounds normal. No respiratory distress.  Abdominal: Soft. Bowel sounds are normal. He exhibits no distension. There is no tenderness.  Musculoskeletal: He exhibits no edema or deformity.  Neurological: He is alert. He exhibits normal muscle tone.  Alert and oriented x3.  Cranial nerves II-XII intact.  No facial asymmetry.  5/5 grip strength RUE (LUE amputated distal to elbow).  5/5 bicep flexion and tricep extension bilaterally. 5/5 flexion/extension at knee, hip, and ankles. No discoordination of FNF, heel to shin, and ambulates without ataxia or antalgic gait.    Skin: No rash noted. He is not diaphoretic.  Psychiatric: He has a normal mood and affect.    ED Treatments / Results  Labs (all labs ordered are listed, but only abnormal results are displayed) Labs Reviewed    BASIC METABOLIC PANEL - Abnormal; Notable for the following components:      Result Value   Glucose, Bld 104 (*)    All other components within normal limits  CBC  I-STAT TROPONIN, ED  I-STAT TROPONIN, ED    EKG None  Radiology Dg Chest 2 View  Result Date: 10/27/2017 CLINICAL DATA:  Chest pain shortness of breath. EXAM: CHEST - 2 VIEW COMPARISON:  07/14/2016 FINDINGS: The heart size and mediastinal contours are within normal limits. Both lungs are clear. The visualized skeletal structures are unremarkable. IMPRESSION: No active cardiopulmonary disease. Electronically Signed   By: Misty Stanley M.D.   On: 10/27/2017 19:04    Procedures Procedures (including critical care time)  Medications Ordered in ED Medications - No data to display   Initial Impression / Assessment and Plan / ED Course  I have reviewed the triage vital signs and the nursing notes.  Pertinent labs & imaging results that were available during my care of the patient were reviewed by me and considered in my medical decision making (see chart for details).    Patient is a 52 year old male with history of CAD status post DES in 2016, hyperlipidemia, and OSA who presents the emergency department today for evaluation of chest pain and headache.  Patient is afebrile and hemodynamically stable presentation.  He is well-appearing.  Pain-free at time of evaluation.  Unremarkable neurological exam as detailed above.  Headache appears benign with tension/pressure-like quality which she has had in the past.  No findings to suggest CVA.  No thunderclap to suggest [place].  No recent falls or traumas.  Takes no anticoagulants.  Improved with rest in the ED.   ECG with right approximate 70, NSR, normal axis, intervals within normal limits, no evidence of ischemia.  No significant changes when compared to ECG from May 2018. High risk by HEART path for ACS given history of CAD however chest pain also appears more atypical in  etiology. CXR w/no acute cardiopulm abnormality. No PTX. No consolidation to suggest PNA and no infectious signs or sx's. CXR with no mediastinal widening and history/exam not consistent with dissection   Labs including BMP, CBC, and trop x2 all unremarkable. Trops undetectable. Discussed case with  cardiology on call who agrees this is more atypical in nature. No emergent indication for admission or further workup at this time and recommended outpt f/u. Pt and wife comfortable with this plan. Reports feeling relieved in ED. Believe stress could be contributing to symptoms. Stable at dc.    Case and plan of care discussed with Dr. Tomi Bamberger.   Final Clinical Impressions(s) / ED Diagnoses   Final diagnoses:  Atypical chest pain  Acute nonintractable headache, unspecified headache type    ED Discharge Orders    None       Corrie Dandy, MD 10/27/17 2346    Dorie Rank, MD 10/28/17 1232

## 2017-10-27 NOTE — Discharge Instructions (Signed)
Follow-up with your primary care doctor for reevaluation.  Return to the emergency department if symptoms worsen.  Continue home medications as previously prescribed.

## 2017-10-27 NOTE — ED Notes (Signed)
Pt states that he took 1 Nitro PTA with no change.

## 2017-10-31 DIAGNOSIS — Z9989 Dependence on other enabling machines and devices: Secondary | ICD-10-CM | POA: Diagnosis not present

## 2017-10-31 DIAGNOSIS — J342 Deviated nasal septum: Secondary | ICD-10-CM

## 2017-10-31 DIAGNOSIS — H7011 Chronic mastoiditis, right ear: Secondary | ICD-10-CM | POA: Diagnosis not present

## 2017-10-31 DIAGNOSIS — F17210 Nicotine dependence, cigarettes, uncomplicated: Secondary | ICD-10-CM | POA: Diagnosis not present

## 2017-10-31 DIAGNOSIS — H9011 Conductive hearing loss, unilateral, right ear, with unrestricted hearing on the contralateral side: Secondary | ICD-10-CM | POA: Insufficient documentation

## 2017-10-31 DIAGNOSIS — J343 Hypertrophy of nasal turbinates: Secondary | ICD-10-CM | POA: Insufficient documentation

## 2017-10-31 DIAGNOSIS — J302 Other seasonal allergic rhinitis: Secondary | ICD-10-CM | POA: Diagnosis not present

## 2017-10-31 DIAGNOSIS — G4733 Obstructive sleep apnea (adult) (pediatric): Secondary | ICD-10-CM | POA: Diagnosis not present

## 2017-10-31 DIAGNOSIS — H6120 Impacted cerumen, unspecified ear: Secondary | ICD-10-CM | POA: Insufficient documentation

## 2017-11-04 ENCOUNTER — Telehealth: Payer: Self-pay | Admitting: Nurse Practitioner

## 2017-11-04 NOTE — Telephone Encounter (Signed)
Ok to hold Plavix for surgery

## 2017-11-04 NOTE — Telephone Encounter (Signed)
Dr. Radford Pax, this patient is scheduled for nasal septoplasty. Can Plavix be held?  Last [facility] 05/25/16 LAD prox stent ok LCx irregs RCA normal EF 55  Please route response back to CV Bolton, AGNP-C [facility] 11/04/2017  9:32 AM

## 2017-11-04 NOTE — Telephone Encounter (Signed)
   Primary Cardiologist: [name], MD  Chart reviewed as part of pre-operative protocol coverage. Patient was contacted 11/04/2017 in reference to pre-operative risk assessment for pending surgery as outlined below.  Garlan Drewes was last seen on 12/31/16 by Richardson Dopp, PA.  Since that day, Isaac Patel has had once episode of atypical chest discomfort, not consistent with his prior anginal CP seen in the ED. Ruled out for MI and no further symptoms. He feels strongly that this was not cardiac in nature and does not feel that he needs to be evaluated in our office prior to this procedure (I offered an appt). He works and is very active, although he does not exercise, without any exertional chest discomfort or dyspnea. He agrees that if he has any recurrence of chest discomfort or new symptoms, he will notify us immediately.   Therefore, based on ACC/AHA guidelines, the patient would be at acceptable risk for the planned procedure without further cardiovascular testing.   Per Dr. Radford Pax, [place] to hold Plavix for surgery, timing per surgeon. Resume after procedure when OK with Surgeon.   I will route this recommendation to the requesting party via Epic fax function and remove from pre-op pool.  Please call with questions.  Daune Perch, NP 11/04/2017, 5:15 PM

## 2017-11-04 NOTE — Telephone Encounter (Signed)
   [facility] Pre-operative Risk Assessment    Request for surgical clearance:  1. What type of surgery is being performed?  Nasal Septoplasty and Bilateral Turbinate Reduction   2. When is this surgery scheduled? November 30, 2017   3. What type of clearance is required (medical clearance vs. Pharmacy clearance to hold med vs. Both)? Pharmacy   4. Are there any medications that need to be held prior to surgery and how long? Plavix, requesting appropriate time frame for patient to be off   5. Practice name and name of physician performing surgery?  [facility]   6. What is your office phone number [phone]     7.   What is your office fax number [phone]   8.   Anesthesia type (None, local, MAC, general) ? General   Christen Bame M 11/04/2017, 8:53 AM  _________________________________________________________________   (provider comments below)

## 2017-11-10 DIAGNOSIS — S58119A Complete traumatic amputation at level between elbow and wrist, unspecified arm, initial encounter: Secondary | ICD-10-CM | POA: Diagnosis not present

## 2017-11-18 ENCOUNTER — Other Ambulatory Visit: Payer: Self-pay | Admitting: Otolaryngology

## 2017-11-21 NOTE — Pre-Procedure Instructions (Signed)
Isaac Patel  11/21/2017      [place], [place] - [address] [facility] [address] [place] [address] Phone: [phone] Fax: [phone]    Your procedure is scheduled on Wednesday September 4.  Report to [facility] Admitting at 6:30 A.M.  Call this number if you have problems the morning of surgery:  [phone]   Remember:  Do not eat or drink after midnight.    Take these medicines the morning of surgery with A SIP OF WATER:   Claritin (loratadine) Tamsulosin (flomax) Pantoprazole (protonix) Flonase if needed  7 days prior to surgery STOP taking any Aleve, Naproxen, Ibuprofen, Motrin, Advil, Goody's, BC's, all herbal medications, fish oil, and all vitamins  FOLLOW your surgeon's instructions on stopping Plavix and Aspirin.     Do not wear jewelry  Do not wear lotions, powders, or colognes, or deodorant.  Do not shave 48 hours prior to surgery.  Men may shave face and neck.  Do not bring valuables to the hospital.  [facility] is not responsible for any belongings or valuables.  Contacts, dentures or bridgework may not be worn into surgery.  Leave your suitcase in the car.  After surgery it may be brought to your room.  For patients admitted to the hospital, discharge time will be determined by your treatment team.  Patients discharged the day of surgery will not be allowed to drive home.   Special instructions:    [place]- Preparing For Surgery  Before surgery, you can play an important role. Because skin is not sterile, your skin needs to be as free of germs as possible. You can reduce the number of germs on your skin by washing with CHG (chlorahexidine gluconate) Soap before surgery.  CHG is an antiseptic cleaner which kills germs and bonds with the skin to continue killing germs even after washing.    Oral Hygiene is also important to reduce your risk of infection.  Remember - BRUSH YOUR  TEETH THE MORNING OF SURGERY WITH YOUR REGULAR TOOTHPASTE  Please do not use if you have an allergy to CHG or antibacterial soaps. If your skin becomes reddened/irritated stop using the CHG.  Do not shave (including legs and underarms) for at least 48 hours prior to first CHG shower. It is OK to shave your face.  Please follow these instructions carefully.   1. Shower the NIGHT BEFORE SURGERY and the MORNING OF SURGERY with CHG.   2. If you chose to wash your hair, wash your hair first as usual with your normal shampoo.  3. After you shampoo, rinse your hair and body thoroughly to remove the shampoo.  4. Use CHG as you would any other liquid soap. You can apply CHG directly to the skin and wash gently with a scrungie or a clean washcloth.   5. Apply the CHG Soap to your body ONLY FROM THE NECK DOWN.  Do not use on open wounds or open sores. Avoid contact with your eyes, ears, mouth and genitals (private parts). Wash Face and genitals (private parts)  with your normal soap.  6. Wash thoroughly, paying special attention to the area where your surgery will be performed.  7. Thoroughly rinse your body with warm water from the neck down.  8. DO NOT shower/wash with your normal soap after using and rinsing off the CHG Soap.  9. Pat yourself dry with a CLEAN TOWEL.  10. Wear CLEAN PAJAMAS to  bed the night before surgery, wear comfortable clothes the morning of surgery  11. Place CLEAN SHEETS on your bed the night of your first shower and DO NOT SLEEP WITH PETS.    Day of Surgery:  Do not apply any deodorants/lotions.  Please wear clean clothes to the hospital/surgery center.   Remember to brush your teeth WITH YOUR REGULAR TOOTHPASTE.    Please read over the following fact sheets that you were given. Coughing and Deep Breathing and Surgical Site Infection Prevention

## 2017-11-22 ENCOUNTER — Other Ambulatory Visit: Payer: Self-pay

## 2017-11-22 ENCOUNTER — Encounter (HOSPITAL_COMMUNITY): Payer: Self-pay

## 2017-11-22 ENCOUNTER — Encounter (HOSPITAL_COMMUNITY)
Admission: RE | Admit: 2017-11-22 | Discharge: 2017-11-22 | Disposition: A | Discharge status: Home / Self Care | Payer: 59 | Source: Ambulatory Visit | Attending: Otolaryngology | Admitting: Otolaryngology

## 2017-11-22 DIAGNOSIS — Z79899 Other long term (current) drug therapy: Secondary | ICD-10-CM | POA: Insufficient documentation

## 2017-11-22 DIAGNOSIS — Z7982 Long term (current) use of aspirin: Secondary | ICD-10-CM | POA: Diagnosis not present

## 2017-11-22 DIAGNOSIS — F329 Major depressive disorder, single episode, unspecified: Secondary | ICD-10-CM | POA: Insufficient documentation

## 2017-11-22 DIAGNOSIS — I2510 Atherosclerotic heart disease of native coronary artery without angina pectoris: Secondary | ICD-10-CM | POA: Diagnosis not present

## 2017-11-22 DIAGNOSIS — K219 Gastro-esophageal reflux disease without esophagitis: Secondary | ICD-10-CM | POA: Insufficient documentation

## 2017-11-22 DIAGNOSIS — F17210 Nicotine dependence, cigarettes, uncomplicated: Secondary | ICD-10-CM | POA: Insufficient documentation

## 2017-11-22 DIAGNOSIS — I252 Old myocardial infarction: Secondary | ICD-10-CM | POA: Diagnosis not present

## 2017-11-22 DIAGNOSIS — J342 Deviated nasal septum: Secondary | ICD-10-CM | POA: Diagnosis not present

## 2017-11-22 DIAGNOSIS — Z01812 Encounter for preprocedural laboratory examination: Secondary | ICD-10-CM | POA: Insufficient documentation

## 2017-11-22 DIAGNOSIS — J343 Hypertrophy of nasal turbinates: Secondary | ICD-10-CM | POA: Diagnosis not present

## 2017-11-22 DIAGNOSIS — G4733 Obstructive sleep apnea (adult) (pediatric): Secondary | ICD-10-CM | POA: Diagnosis not present

## 2017-11-22 DIAGNOSIS — I10 Essential (primary) hypertension: Secondary | ICD-10-CM | POA: Insufficient documentation

## 2017-11-22 DIAGNOSIS — E785 Hyperlipidemia, unspecified: Secondary | ICD-10-CM | POA: Insufficient documentation

## 2017-11-22 HISTORY — DX: Essential (primary) hypertension: I10

## 2017-11-22 LAB — BASIC METABOLIC PANEL
Anion gap: 8 (ref 5–15)
BUN: 14 mg/dL (ref 6–20)
CHLORIDE: 109 mmol/L (ref 98–111)
CO2: 23 mmol/L (ref 22–32)
CREATININE: 1.07 mg/dL (ref 0.61–1.24)
Calcium: 9 mg/dL (ref 8.9–10.3)
GFR calc Af Amer: >60 mL/min (ref >60)
GFR calc non Af Amer: >60 mL/min (ref >60)
GLUCOSE: 89 mg/dL (ref 70–99)
Potassium: 3.9 mmol/L (ref 3.5–5.1)
SODIUM: 140 mmol/L (ref 135–145)

## 2017-11-22 LAB — CBC
HCT: 47 % (ref 39.0–52.0)
Hemoglobin: 15.6 g/dL (ref 13.0–17.0)
MCH: 31.8 pg (ref 26.0–34.0)
MCHC: 33.2 g/dL (ref 30.0–36.0)
MCV: 95.9 fL (ref 78.0–100.0)
PLATELETS: 226 10*3/uL (ref 150–400)
RBC: 4.9 MIL/uL (ref 4.22–5.81)
RDW: 12.4 % (ref 11.5–15.5)
WBC: 8 10*3/uL (ref 4.0–10.5)

## 2017-11-22 NOTE — Progress Notes (Signed)
PCP - Caesar Chestnut NP Cardiologist -  Sharlotte Alamo  Chest x-ray - 10/27/17 EKG - 10/28/17 ECHO - 10/30/14 Cardiac Cath - 05/25/16  Sleep Study - 06/17/11 CPAP - yes  Blood Thinner Instructions: pt stopped taking plavix on 11/21/17 Aspirin Instructions: Called Dr. Victorio Palm office for instructions on when to stop taking and will call pt back with instructions  Anesthesia review: yes, hx CAD  Patient denies shortness of breath, fever, cough and chest pain at PAT appointment   Patient verbalized understanding of instructions that were given to them at the PAT appointment. Patient was also instructed that they will need to review over the PAT instructions again at home before surgery.

## 2017-11-23 NOTE — Progress Notes (Signed)
Anesthesia Chart Review:  Case:  [id number] Date/Time:  11/30/17 0815   Procedure:  NASAL SEPTOPLASTY WITH TURBINATE REDUCTION (Bilateral )   Anesthesia type:  General   Pre-op diagnosis:  Deviated nasal septum and nasal turbinate hypertrophy   Location:  [facility] OR ROOM 09 / [place] OR   Surgeon:  Jerrell Belfast, MD      DISCUSSION: 52 yo male current smoker for above procedure. Pertinent hx includes GERD, OSA sometimes CPAP, HTN, HA, Depression, HLD NSTEMI/CAD (Non-STEMI 8/16: LHC-proximal LAD 80% treated with a resolute DES, patent by cath 04/2016 which showed EF 55% with normal LV function).  Pt was seen in ED 10/28/2017 for chest pain. Cardiac workup including troponin negative, case discussed with cardiology who agreed pain was atypical in nature. Pt's symptoms resolved with rest in ED.  Subsequently the patient was given cardiac clearance for surgery by Daune Perch, NP. Per telephone call dated 11/04/2017 "Patient was contacted 11/04/2017 in reference to pre-operative risk assessment for pending surgery as outlined below.  Suvan Stcyr was last seen on 12/31/16 by Richardson Dopp, PA.  Since that day, Heston Widener has had once episode of atypical chest discomfort, not consistent with his prior anginal CP seen in the ED. Ruled out for MI and no further symptoms. He feels strongly that this was not cardiac in nature and does not feel that he needs to be evaluated in our office prior to this procedure (I offered an appt). He works and is very active, although he does not exercise, without any exertional chest discomfort or dyspnea. He agrees that if he has any recurrence of chest discomfort or new symptoms, he will notify us immediately.   Therefore, based on ACC/AHA guidelines, the patient would be at acceptable risk for the planned procedure without further cardiovascular testing.   Per Dr. Radford Pax, Teller to hold Plavix for surgery, timing per surgeon. Resume after procedure when OK with Surgeon."  Per  pt LD of Plavix 11/21/17.  Anticipate pt can proceed with surgery as planned barring acute status change.  VS: BP 129/86   Pulse 81   Temp 36.7 C (Oral)   Resp 18   Ht 5\' 11"  (1.803 m)   Wt 89 kg   SpO2 97%   BMI 27.37 kg/m   PROVIDERS: [name], NP is PCP  [name], MD is Cardiologist  LABS: Labs reviewed: Acceptable for surgery. (all labs ordered are listed, but only abnormal results are displayed)  Labs Reviewed  BASIC METABOLIC PANEL  CBC     IMAGES: CHEST - 2 VIEW 10/27/2017  COMPARISON:  07/14/2016  FINDINGS: The heart size and mediastinal contours are within normal limits. Both lungs are clear. The visualized skeletal structures are unremarkable.  IMPRESSION: No active cardiopulmonary disease.  EKG: 10/28/2017: NSR  CV: LHC 05/25/2016: 1. Patent proximal LAD stent 2. Minor nonobstructive CAD 3. Normal LV systolic function with LVEF estimated at 55%  TTE 10/30/2014: Study Conclusions  - Left ventricle: The cavity size was normal. There was moderate   concentric hypertrophy. Systolic function was normal. The   estimated ejection fraction was in the range of 55% to 60%. Wall   motion was normal; there were no regional wall motion   abnormalities. Left ventricular diastolic function parameters   were normal. - Aortic valve: Trileaflet; normal thickness leaflets. There was no   regurgitation. - Aortic root: The aortic root was normal in size. - Mitral valve: Structurally normal valve. There was no   regurgitation. -  Right ventricle: The cavity size was normal. Wall thickness was   normal. Systolic function was normal. - Right atrium: The atrium was normal in size. - Tricuspid valve: There was no regurgitation. - Pulmonic valve: There was no regurgitation. - Pulmonary arteries: Systolic pressure couldn&'t be assessed as   there was no tricuspid regurgitation. - Inferior vena cava: The vessel was normal in size. - Pericardium,  extracardiac: There was no pericardial effusion.  Impressions:  - Normal study.  Past Medical History:  Diagnosis Date  . CAD (coronary artery disease)    a.  Non-STEMI 8/16: LHC-proximal LAD 80% treated with a resolute DES, EF 50-55%;  b.  Echo 8/16:  Moderate LVH, EF 55-60%, normal wall motion, normal diastolic function, normal RV function  . Depression    hx  . GERD (gastroesophageal reflux disease)   . Headache    "maybe monthly" (10/28/2014)  . HLD (hyperlipidemia)   . Hypertension   . NSTEMI (non-ST elevated myocardial infarction) ([place]) 10/28/2014  . OSA (obstructive sleep apnea)    "tried mask; wear it off and on" (10/28/2014)    Past Surgical History:  Procedure Laterality Date  . 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 CARDIAC CATHETERIZATION N/A 10/29/2014   Procedure: Left Heart Cath and Coronary Angiography;  Surgeon: Troy Sine, MD;  Location: [place] [facility];  Service: Cardiovascular;  Laterality: N/A;  . CHOLESTEATOMA EXCISION Right 1990's  . LEFT HEART CATH AND CORONARY ANGIOGRAPHY N/A 05/25/2016   Procedure: Left Heart Cath and Coronary Angiography;  Surgeon: Sherren Mocha, MD;  Location: [place] [facility];  Service: Cardiovascular;  Laterality: N/A;  . TYMPANOSTOMY TUBE PLACEMENT Bilateral "as a kid"   "had one done as an adult too"  . WISDOM TOOTH EXTRACTION      MEDICATIONS: . aspirin 81 MG tablet  . atorvastatin (LIPITOR) 80 MG tablet  . clopidogrel (PLAVIX) 75 MG tablet  . diclofenac sodium (VOLTAREN) 1 % GEL  . dicyclomine (BENTYL) 10 MG capsule  . fluticasone (FLONASE) 50 MCG/ACT nasal spray  . lisinopril (PRINIVIL,ZESTRIL) 10 MG tablet  . loratadine (CLARITIN) 10 MG tablet  . Multiple Vitamin (MULTI-VITAMIN PO)  . nitroGLYCERIN (NITROSTAT) 0.4 MG SL tablet  . nystatin ointment (MYCOSTATIN)  . pantoprazole (PROTONIX) 40 MG tablet  . tamsulosin (FLOMAX) 0.4 MG CAPS capsule   No current facility-administered medications  for this encounter.    Wynonia Musty [facility] Short Stay Center/Anesthesiology Phone [phone] 11/23/2017 11:26 AM

## 2017-11-30 ENCOUNTER — Other Ambulatory Visit: Payer: Self-pay

## 2017-11-30 ENCOUNTER — Encounter (HOSPITAL_COMMUNITY)
Admission: RE | Disposition: A | Discharge status: Home / Self Care | Payer: Self-pay | Source: Ambulatory Visit | Attending: Otolaryngology

## 2017-11-30 ENCOUNTER — Ambulatory Visit (HOSPITAL_COMMUNITY): Payer: 59 | Admitting: Physician Assistant

## 2017-11-30 ENCOUNTER — Encounter (HOSPITAL_COMMUNITY): Payer: Self-pay | Admitting: Anesthesiology

## 2017-11-30 ENCOUNTER — Ambulatory Visit (HOSPITAL_COMMUNITY): Payer: 59 | Admitting: Anesthesiology

## 2017-11-30 ENCOUNTER — Ambulatory Visit (HOSPITAL_COMMUNITY)
Admission: RE | Admit: 2017-11-30 | Discharge: 2017-11-30 | Disposition: A | Discharge status: Home / Self Care | Payer: 59 | Source: Ambulatory Visit | Attending: Otolaryngology | Admitting: Otolaryngology

## 2017-11-30 DIAGNOSIS — Z79899 Other long term (current) drug therapy: Secondary | ICD-10-CM | POA: Diagnosis not present

## 2017-11-30 DIAGNOSIS — I252 Old myocardial infarction: Secondary | ICD-10-CM | POA: Diagnosis not present

## 2017-11-30 DIAGNOSIS — J343 Hypertrophy of nasal turbinates: Secondary | ICD-10-CM | POA: Insufficient documentation

## 2017-11-30 DIAGNOSIS — Z7902 Long term (current) use of antithrombotics/antiplatelets: Secondary | ICD-10-CM | POA: Insufficient documentation

## 2017-11-30 DIAGNOSIS — J342 Deviated nasal septum: Secondary | ICD-10-CM | POA: Diagnosis not present

## 2017-11-30 DIAGNOSIS — F329 Major depressive disorder, single episode, unspecified: Secondary | ICD-10-CM | POA: Diagnosis not present

## 2017-11-30 DIAGNOSIS — G4733 Obstructive sleep apnea (adult) (pediatric): Secondary | ICD-10-CM | POA: Insufficient documentation

## 2017-11-30 DIAGNOSIS — E785 Hyperlipidemia, unspecified: Secondary | ICD-10-CM | POA: Diagnosis not present

## 2017-11-30 DIAGNOSIS — K219 Gastro-esophageal reflux disease without esophagitis: Secondary | ICD-10-CM | POA: Insufficient documentation

## 2017-11-30 DIAGNOSIS — F17210 Nicotine dependence, cigarettes, uncomplicated: Secondary | ICD-10-CM | POA: Insufficient documentation

## 2017-11-30 DIAGNOSIS — Z955 Presence of coronary angioplasty implant and graft: Secondary | ICD-10-CM | POA: Diagnosis not present

## 2017-11-30 DIAGNOSIS — I2510 Atherosclerotic heart disease of native coronary artery without angina pectoris: Secondary | ICD-10-CM | POA: Insufficient documentation

## 2017-11-30 DIAGNOSIS — Z7982 Long term (current) use of aspirin: Secondary | ICD-10-CM | POA: Diagnosis not present

## 2017-11-30 DIAGNOSIS — F419 Anxiety disorder, unspecified: Secondary | ICD-10-CM | POA: Diagnosis not present

## 2017-11-30 DIAGNOSIS — I10 Essential (primary) hypertension: Secondary | ICD-10-CM | POA: Insufficient documentation

## 2017-11-30 DIAGNOSIS — Z885 Allergy status to narcotic agent status: Secondary | ICD-10-CM | POA: Insufficient documentation

## 2017-11-30 DIAGNOSIS — J3489 Other specified disorders of nose and nasal sinuses: Secondary | ICD-10-CM | POA: Diagnosis not present

## 2017-11-30 HISTORY — PX: NASAL SEPTOPLASTY W/ TURBINOPLASTY: SHX2070

## 2017-11-30 SURGERY — SEPTOPLASTY, NOSE, WITH NASAL TURBINATE REDUCTION
Anesthesia: General | Site: Nose | Laterality: Bilateral

## 2017-11-30 MED ORDER — DEXAMETHASONE SODIUM PHOSPHATE 10 MG/ML IJ SOLN
INTRAMUSCULAR | Status: AC
  Filled 2017-11-30: qty 1

## 2017-11-30 MED ORDER — MIDAZOLAM HCL 5 MG/5ML IJ SOLN
INTRAMUSCULAR | Status: DC | PRN
  Administered 2017-11-30: 2 mg via INTRAVENOUS

## 2017-11-30 MED ORDER — FENTANYL CITRATE (PF) 100 MCG/2ML IJ SOLN
INTRAMUSCULAR | Status: AC
  Filled 2017-11-30: qty 2

## 2017-11-30 MED ORDER — ONDANSETRON HCL 4 MG/2ML IJ SOLN
INTRAMUSCULAR | Status: AC
  Filled 2017-11-30: qty 2

## 2017-11-30 MED ORDER — HYDROCODONE-ACETAMINOPHEN 5-325 MG PO TABS: 1.0000 | ORAL_TABLET | Freq: Four times a day (QID) | ORAL | 0 refills | Status: DC | PRN

## 2017-11-30 MED ORDER — LIDOCAINE-EPINEPHRINE 1 %-1:100000 IJ SOLN
INTRAMUSCULAR | Status: DC | PRN
  Administered 2017-11-30: 10 mL

## 2017-11-30 MED ORDER — OXYCODONE HCL 5 MG/5ML PO SOLN: 5.0000 mg | Freq: Once | ORAL | Status: DC | PRN

## 2017-11-30 MED ORDER — LIDOCAINE 2% (20 MG/ML) 5 ML SYRINGE
INTRAMUSCULAR | Status: DC | PRN
  Administered 2017-11-30: 60 mg via INTRAVENOUS

## 2017-11-30 MED ORDER — SUCCINYLCHOLINE CHLORIDE 200 MG/10ML IV SOSY
PREFILLED_SYRINGE | INTRAVENOUS | Status: AC
  Filled 2017-11-30: qty 10

## 2017-11-30 MED ORDER — FENTANYL CITRATE (PF) 250 MCG/5ML IJ SOLN
INTRAMUSCULAR | Status: DC | PRN
  Administered 2017-11-30: 150 ug via INTRAVENOUS

## 2017-11-30 MED ORDER — CEPHALEXIN 500 MG PO CAPS: 500.0000 mg | ORAL_CAPSULE | Freq: Three times a day (TID) | ORAL | 0 refills | Status: AC

## 2017-11-30 MED ORDER — MUPIROCIN CALCIUM 2 % EX CREA
TOPICAL_CREAM | CUTANEOUS | Status: AC
  Filled 2017-11-30: qty 15

## 2017-11-30 MED ORDER — CHLORHEXIDINE GLUCONATE CLOTH 2 % EX PADS: 6.0000 | MEDICATED_PAD | Freq: Once | CUTANEOUS | Status: DC

## 2017-11-30 MED ORDER — ONDANSETRON HCL 4 MG/2ML IJ SOLN
INTRAMUSCULAR | Status: DC | PRN
  Administered 2017-11-30: 4 mg via INTRAVENOUS

## 2017-11-30 MED ORDER — PROPOFOL 10 MG/ML IV BOLUS
INTRAVENOUS | Status: AC
  Filled 2017-11-30: qty 20

## 2017-11-30 MED ORDER — 0.9 % SODIUM CHLORIDE (POUR BTL) OPTIME
TOPICAL | Status: DC | PRN
  Administered 2017-11-30: 1000 mL

## 2017-11-30 MED ORDER — EPHEDRINE 5 MG/ML INJ
INTRAVENOUS | Status: AC
  Filled 2017-11-30: qty 10

## 2017-11-30 MED ORDER — GLYCOPYRROLATE PF 0.2 MG/ML IJ SOSY
PREFILLED_SYRINGE | INTRAMUSCULAR | Status: AC
  Filled 2017-11-30: qty 1

## 2017-11-30 MED ORDER — SUCCINYLCHOLINE CHLORIDE 200 MG/10ML IV SOSY
PREFILLED_SYRINGE | INTRAVENOUS | Status: DC | PRN
  Administered 2017-11-30: 120 mg via INTRAVENOUS

## 2017-11-30 MED ORDER — OXYMETAZOLINE HCL 0.05 % NA SOLN
NASAL | Status: DC | PRN
  Administered 2017-11-30: 1

## 2017-11-30 MED ORDER — SALINE SPRAY 0.65 % NA SOLN
1.0000 | NASAL | Status: DC | PRN
  Filled 2017-11-30 (×3): qty 44

## 2017-11-30 MED ORDER — PROPOFOL 10 MG/ML IV BOLUS
INTRAVENOUS | Status: DC | PRN
  Administered 2017-11-30: 200 mg via INTRAVENOUS

## 2017-11-30 MED ORDER — MUPIROCIN 2 % EX OINT
TOPICAL_OINTMENT | CUTANEOUS | Status: AC
  Filled 2017-11-30: qty 22

## 2017-11-30 MED ORDER — OXYMETAZOLINE HCL 0.05 % NA SOLN
1.0000 | Freq: Two times a day (BID) | NASAL | Status: DC
  Administered 2017-11-30: 1 via NASAL
  Filled 2017-11-30: qty 15

## 2017-11-30 MED ORDER — FENTANYL CITRATE (PF) 100 MCG/2ML IJ SOLN
25.0000 ug | INTRAMUSCULAR | Status: DC | PRN
  Administered 2017-11-30: 25 ug via INTRAVENOUS

## 2017-11-30 MED ORDER — FENTANYL CITRATE (PF) 250 MCG/5ML IJ SOLN
INTRAMUSCULAR | Status: AC
Start: 2017-11-30 — End: ?
  Filled 2017-11-30: qty 5

## 2017-11-30 MED ORDER — PHENYLEPHRINE 40 MCG/ML (10ML) SYRINGE FOR IV PUSH (FOR BLOOD PRESSURE SUPPORT)
PREFILLED_SYRINGE | INTRAVENOUS | Status: AC
  Filled 2017-11-30: qty 10

## 2017-11-30 MED ORDER — ROCURONIUM BROMIDE 50 MG/5ML IV SOSY
PREFILLED_SYRINGE | INTRAVENOUS | Status: AC
  Filled 2017-11-30: qty 5

## 2017-11-30 MED ORDER — LACTATED RINGERS IV SOLN
INTRAVENOUS | Status: DC | PRN
  Administered 2017-11-30: 09:00:00 via INTRAVENOUS

## 2017-11-30 MED ORDER — LIDOCAINE-EPINEPHRINE 1 %-1:100000 IJ SOLN
INTRAMUSCULAR | Status: AC
  Filled 2017-11-30: qty 1

## 2017-11-30 MED ORDER — ONDANSETRON HCL 4 MG/2ML IJ SOLN: 4.0000 mg | Freq: Four times a day (QID) | INTRAMUSCULAR | Status: DC | PRN

## 2017-11-30 MED ORDER — LIDOCAINE 2% (20 MG/ML) 5 ML SYRINGE
INTRAMUSCULAR | Status: AC
  Filled 2017-11-30: qty 5

## 2017-11-30 MED ORDER — OXYMETAZOLINE HCL 0.05 % NA SOLN
NASAL | Status: AC
  Filled 2017-11-30: qty 15

## 2017-11-30 MED ORDER — ROCURONIUM BROMIDE 10 MG/ML (PF) SYRINGE
PREFILLED_SYRINGE | INTRAVENOUS | Status: DC | PRN
  Administered 2017-11-30: 20 mg via INTRAVENOUS

## 2017-11-30 MED ORDER — SUGAMMADEX SODIUM 200 MG/2ML IV SOLN
INTRAVENOUS | Status: DC | PRN
  Administered 2017-11-30: 200 mg via INTRAVENOUS

## 2017-11-30 MED ORDER — MIDAZOLAM HCL 2 MG/2ML IJ SOLN
INTRAMUSCULAR | Status: AC
  Filled 2017-11-30: qty 2

## 2017-11-30 MED ORDER — CEFAZOLIN SODIUM-DEXTROSE 2-4 GM/100ML-% IV SOLN
2.0000 g | INTRAVENOUS | Status: AC
  Administered 2017-11-30: 2 g via INTRAVENOUS
  Filled 2017-11-30: qty 100

## 2017-11-30 MED ORDER — OXYCODONE HCL 5 MG PO TABS: 5.0000 mg | ORAL_TABLET | Freq: Once | ORAL | Status: DC | PRN

## 2017-11-30 MED ORDER — DEXAMETHASONE SODIUM PHOSPHATE 10 MG/ML IJ SOLN
10.0000 mg | Freq: Once | INTRAMUSCULAR | Status: AC
  Administered 2017-11-30: 10 mg via INTRAVENOUS
  Filled 2017-11-30: qty 1

## 2017-11-30 SURGICAL SUPPLY — 25 items
BLADE SURG 15 STRL LF DISP TIS (BLADE) ×1 IMPLANT
BLADE SURG 15 STRL SS (BLADE) ×2
CANISTER SUCT 3000ML PPV (MISCELLANEOUS) ×3 IMPLANT
COAGULATOR SUCT 8FR VV (MISCELLANEOUS) IMPLANT
DRAPE HALF SHEET 40X57 (DRAPES) IMPLANT
ELECT REM PT RETURN 9FT ADLT (ELECTROSURGICAL)
ELECTRODE REM PT RTRN 9FT ADLT (ELECTROSURGICAL) IMPLANT
GAUZE SPONGE 2X2 8PLY STRL LF (GAUZE/BANDAGES/DRESSINGS) ×1 IMPLANT
GLOVE BIOGEL M 7.0 STRL (GLOVE) ×6 IMPLANT
GOWN STRL REUS W/ TWL LRG LVL3 (GOWN DISPOSABLE) ×2 IMPLANT
GOWN STRL REUS W/TWL LRG LVL3 (GOWN DISPOSABLE) ×4
KIT BASIN OR (CUSTOM PROCEDURE TRAY) ×3 IMPLANT
KIT TURNOVER KIT B (KITS) ×3 IMPLANT
NEEDLE HYPO 25GX1X1/2 BEV (NEEDLE) ×3 IMPLANT
NS IRRIG 1000ML POUR BTL (IV SOLUTION) ×3 IMPLANT
PAD ARMBOARD 7.5X6 YLW CONV (MISCELLANEOUS) ×3 IMPLANT
SPLINT NASAL DOYLE BI-VL (GAUZE/BANDAGES/DRESSINGS) ×3 IMPLANT
SPONGE GAUZE 2X2 STER 10/PKG (GAUZE/BANDAGES/DRESSINGS) ×2
SPONGE NEURO XRAY DETECT 1X3 (DISPOSABLE) ×3 IMPLANT
SUT ETHILON 3 0 PS 1 (SUTURE) ×3 IMPLANT
SUT PLAIN 4 0 ~~LOC~~ 1 (SUTURE) ×3 IMPLANT
TOWEL OR 17X24 6PK STRL BLUE (TOWEL DISPOSABLE) ×3 IMPLANT
TRAY ENT MC OR (CUSTOM PROCEDURE TRAY) ×3 IMPLANT
TUBE SALEM SUMP 16 FR W/ARV (TUBING) ×3 IMPLANT
TUBING EXTENTION W/L.L. (IV SETS) ×3 IMPLANT

## 2017-11-30 NOTE — Anesthesia Preprocedure Evaluation (Addendum)
Anesthesia Evaluation  Patient identified by MRN, date of birth, ID band Patient awake    Reviewed: Allergy & Precautions, H&P , NPO status , Patient's Chart, lab work & pertinent test results  Airway Mallampati: II   Neck ROM: full    Dental   Pulmonary sleep apnea , Current Smoker,    breath sounds clear to auscultation       Cardiovascular hypertension, + CAD, + Past MI and + Cardiac Stents   Rhythm:regular Rate:Normal  Stent placed 2016.  Cath (04/2016): patent stent. EF 55%   Neuro/Psych  Headaches, PSYCHIATRIC DISORDERS Anxiety Depression    GI/Hepatic GERD  ,  Endo/Other    Renal/GU      Musculoskeletal   Abdominal   Peds  Hematology   Anesthesia Other Findings   Reproductive/Obstetrics                            Anesthesia Physical Anesthesia Plan  ASA: III  Anesthesia Plan: General   Post-op Pain Management:    Induction: Intravenous  PONV Risk Score and Plan: 1 and Ondansetron, Dexamethasone, Midazolam and Treatment may vary due to age or medical condition  Airway Management Planned: Oral ETT  Additional Equipment:   Intra-op Plan:   Post-operative Plan: Extubation in OR  Informed Consent: I have reviewed the patients History and Physical, chart, labs and discussed the procedure including the risks, benefits and alternatives for the proposed anesthesia with the patient or authorized representative who has indicated his/her understanding and acceptance.     Plan Discussed with: CRNA, Anesthesiologist and Surgeon  Anesthesia Plan Comments:         Anesthesia Quick Evaluation

## 2017-11-30 NOTE — Op Note (Signed)
Operative Note: SEPTOPLASTY AND INFERIOR TURBINATE REDUCTION  Patient: Isaac Patel  Medical record number: [id number]  Date:11/30/2017  Pre-operative Indications: 1. Deviated nasal septum with nasal airway obstruction     2.  Bilateral inferior turbinate hypertrophy  Postoperative Indications: Same  Surgical Procedure: 1.  Nasal Septoplasty    2.  Bilateral Inferior Turbinate Reduction  Anesthesia: GET  Surgeon: Delsa Bern, M.D.  Complications: None  EBL: 50 cc  Findings: Severely deviated nasal septum with airway obstruction and bilateral inferior turbinate hypertrophy.   Brief History: The patient is a 52 y.o. male with a history of progressive nasal airway obstruction. The patient has been on medical therapy to reduce nasal mucosal edema including saline nasal spray and topical nasal steroids. Despite appropriate medical therapy the patient continues to have ongoing symptoms. Given the patient's history and findings, the above surgical procedures were recommended, risks and benefits were discussed in detail with the patient may understand and agree with our plan for surgery which is scheduled at [facility] under general anesthesia as an outpatient.  Surgical Procedure: The patient is brought to the operating room on 11/30/2017 and placed in supine position on the operating table. General endotracheal anesthesia was established without difficulty. When the patient was adequately anesthetized, surgical timeout was performed and correct identification of the patient and the surgical procedure. The patient's nose was then injected with 6 cc of 1% lidocaine 1 100,000 dilution epinephrine which was injected in a submucosal fashion. The patient's nose was then packed with Afrin-soaked cottonoid pledgets were left in place for approximately 10 minutes lateral vasoconstriction and hemostasis. .  With the patient prepped draped and prepared for surgery, nasal septoplasty was  begun.  A left anterior hemitransfixion incision was created and a mucoperichondrial flap was elevated from anterior to posterior on the left-hand side. The anterior cartilaginous septum was crossed at the midline and a mucoperichondrial flap was elevated on the patient's right.  Swivel knife was then used to resect the anterior and mid cartilaginous portion of the nasal septum.  Resected cartilage was morcellized and returned to the mucoperichondrial pocket at the occlusion of the surgical procedure.  Dissection was then carried out from anterior to posterior removing deviated bone and cartilage including a large septal spur the overlying mucosa was preserved.  With the septum brought to good midline position, the morselized cartilage was returned to the mucoperichondrial pocket and the soft tissue/mucosal flaps were reapproximated with interrupted 4-0 gut suture on a Keith needle in a horizontal mattressing fashion.  Anterior hemitransfixion incision was closed with the same stitch.  No packing and no splints were placed.  Attention was then turned to the inferior turbinates, bilateral inferior turbinate intramural cautery was performed with cautery setting at 54 W.  2 submucosal passes were made in each inferior turbinate.  After completing cautery, anterior vertical incisions were created and overlying soft tissue was elevated, a small amount of turbinate bone was resected.  The turbinates were then outfractured to create a more patent nasal passageway.  Surgical sponge count was correct. An oral gastric tube was passed and the stomach contents were aspirated. Patient was awakened from anesthetic and transferred from the operating room to the recovery room in stable condition. There were no complications and blood loss was 50cc.   Delsa Bern, M.D. [facility] ENT 11/30/2017

## 2017-11-30 NOTE — H&P (Signed)
Isaac Patel is an 52 y.o. male.   Chief Complaint: Nasal obstruction HPI: patient with progressive nasal obstruction, deviated septum and IT hypertrophy  Past Medical History:  Diagnosis Date  . CAD (coronary artery disease)    a.  Non-STEMI 8/16: LHC-proximal LAD 80% treated with a resolute DES, EF 50-55%;  b.  Echo 8/16:  Moderate LVH, EF 55-60%, normal wall motion, normal diastolic function, normal RV function  . Depression    hx  . GERD (gastroesophageal reflux disease)   . Headache    "maybe monthly" (10/28/2014)  . HLD (hyperlipidemia)   . Hypertension   . NSTEMI (non-ST elevated myocardial infarction) ([place]) 10/28/2014  . OSA (obstructive sleep apnea)    "tried mask; wear it off and on" (10/28/2014)    Past Surgical History:  Procedure Laterality Date  . 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 CARDIAC CATHETERIZATION N/A 10/29/2014   Procedure: Left Heart Cath and Coronary Angiography;  Surgeon: Troy Sine, MD;  Location: [place] [facility];  Service: Cardiovascular;  Laterality: N/A;  . CHOLESTEATOMA EXCISION Right 1990's  . LEFT HEART CATH AND CORONARY ANGIOGRAPHY N/A 05/25/2016   Procedure: Left Heart Cath and Coronary Angiography;  Surgeon: Sherren Mocha, MD;  Location: [place] [facility];  Service: Cardiovascular;  Laterality: N/A;  . TYMPANOSTOMY TUBE PLACEMENT Bilateral "as a kid"   "had one done as an adult too"  . WISDOM TOOTH EXTRACTION      Family History  Problem Relation Age of Onset  . Diverticulitis Mother   . Skin cancer Mother   . Healthy Father   . Bradycardia Father   . Multiple sclerosis Maternal Grandfather   . Colon cancer Neg Hx   . Stomach cancer Neg Hx   . Rectal cancer Neg Hx   . Liver cancer Neg Hx   . Esophageal cancer Neg Hx    Social History:  reports that he has been smoking cigarettes. He has a 10.56 pack-year smoking history. He has never used smokeless tobacco. He reports that he has current or past drug  history. Drug: Marijuana. He reports that he does not drink alcohol.  Allergies:  Allergies  Allergen Reactions  . Morphine And Related Itching    Medications Prior to Admission  Medication Sig Dispense Refill  . aspirin 81 MG tablet Take 1 tablet (81 mg total) by mouth daily. 30 tablet   . atorvastatin (LIPITOR) 80 MG tablet Take 1 tablet (80 mg total) by mouth daily at 6 PM. 90 tablet 3  . clopidogrel (PLAVIX) 75 MG tablet Take 1 tablet (75 mg total) by mouth daily. 90 tablet 3  . diclofenac sodium (VOLTAREN) 1 % GEL Apply 2 g topically 4 (four) times daily. (Patient taking differently: Apply 2 g topically 4 (four) times daily as needed (pain). ) 100 g 0  . fluticasone (FLONASE) 50 MCG/ACT nasal spray Place 2 sprays into both nostrils daily as needed for allergies or rhinitis.     Marland Kitchen lisinopril (PRINIVIL,ZESTRIL) 10 MG tablet Take 1 tablet (10 mg total) by mouth daily. 90 tablet 3  . loratadine (CLARITIN) 10 MG tablet Take 10 mg by mouth daily as needed for allergies.     . Multiple Vitamin (MULTI-VITAMIN PO) Take 1 tablet by mouth daily.    . nitroGLYCERIN (NITROSTAT) 0.4 MG SL tablet Place 1 tablet (0.4 mg total) under the tongue every 5 (five) minutes x 3 doses as needed for chest pain. 25 tablet 12  .  pantoprazole (PROTONIX) 40 MG tablet TAKE 1 TABLET BY MOUTH DAILY. 90 tablet 0  . tamsulosin (FLOMAX) 0.4 MG CAPS capsule Take 1 capsule (0.4 mg total) by mouth daily. 30 capsule 3  . dicyclomine (BENTYL) 10 MG capsule Take 1 capsule (10 mg total) by mouth 4 (four) times daily as needed for spasms. For abdominal pain and bloating 120 capsule 5  . nystatin ointment (MYCOSTATIN) Apply 1 application topically daily as needed (arm rash).      No results found for this or any previous visit (from the past 48 hour(s)). No results found.  Review of Systems  Constitutional: Negative.   HENT: Positive for congestion.   Respiratory: Negative.   Cardiovascular: Negative.     Blood pressure  121/78, pulse 60, temperature 98.7 F (37.1 C), temperature source Oral, resp. rate 18, height 5\' 11"  (1.803 m), weight 89 kg, SpO2 96 %. Physical Exam  Constitutional: He appears well-developed and well-nourished.  HENT:  Septal deviation  Neck: Normal range of motion. Neck supple.  Cardiovascular: Normal rate.  Respiratory: Effort normal.     Assessment/Plan Adm for OP septoplasty and IT reduction  Danine Hor, MD 11/30/2017, 8:27 AM

## 2017-11-30 NOTE — Transfer of Care (Signed)
Immediate Anesthesia Transfer of Care Note  Patient: Isaac Patel  Procedure(s) Performed: NASAL SEPTOPLASTY WITH TURBINATE REDUCTION (Bilateral Nose)  Patient Location: PACU  Anesthesia Type:General  Level of Consciousness: awake, alert  and oriented  Airway & Oxygen Therapy: Patient Spontanous Breathing and Patient connected to face mask oxygen  Post-op Assessment: Report given to RN and Post -op Vital signs reviewed and stable  Post vital signs: Reviewed and stable  Last Vitals:  Vitals Value Taken Time  BP 143/98 11/30/2017  9:55 AM  Temp    Pulse 71 11/30/2017  9:56 AM  Resp 10 11/30/2017  9:56 AM  SpO2 99 % 11/30/2017  9:56 AM  Vitals shown include unvalidated device data.  Last Pain:  Vitals:   11/30/17 0706  TempSrc:   PainSc: 0-No pain      Patients Stated Pain Goal: 0 (53/79/43 2761)  Complications: No apparent anesthesia complications

## 2017-11-30 NOTE — Anesthesia Procedure Notes (Signed)
Procedure Name: Intubation Date/Time: 11/30/2017 9:03 AM Performed by: Marsa Aris, CRNA Pre-anesthesia Checklist: Patient identified, Emergency Drugs available, Suction available and Patient being monitored Patient Re-evaluated:Patient Re-evaluated prior to induction Oxygen Delivery Method: Circle System Utilized Preoxygenation: Pre-oxygenation with 100% oxygen Induction Type: IV induction Ventilation: Mask ventilation without difficulty Laryngoscope Size: Miller and 2 Grade View: Grade I Tube type: Oral Tube size: 7.5 mm Number of attempts: 1 Airway Equipment and Method: Stylet and Oral airway Placement Confirmation: ETT inserted through vocal cords under direct vision,  positive ETCO2 and breath sounds checked- equal and bilateral Secured at: 22 cm Tube secured with: Tape Dental Injury: Teeth and Oropharynx as per pre-operative assessment

## 2017-12-01 ENCOUNTER — Encounter (HOSPITAL_COMMUNITY): Payer: Self-pay | Admitting: Otolaryngology

## 2017-12-02 NOTE — Anesthesia Postprocedure Evaluation (Signed)
Anesthesia Post Note  Patient: Isaac Patel  Procedure(s) Performed: NASAL SEPTOPLASTY WITH TURBINATE REDUCTION (Bilateral Nose)     Patient location during evaluation: PACU Anesthesia Type: General Level of consciousness: awake and alert Pain management: pain level controlled Vital Signs Assessment: post-procedure vital signs reviewed and stable Respiratory status: spontaneous breathing, nonlabored ventilation, respiratory function stable and patient connected to nasal cannula oxygen Cardiovascular status: blood pressure returned to baseline and stable Postop Assessment: no apparent nausea or vomiting Anesthetic complications: no    Last Vitals:  Vitals:   11/30/17 1125 11/30/17 1145  BP: (!) 144/93 (!) 147/95  Pulse: (!) 58 (!) 55  Resp: 14 15  Temp:    SpO2: 94% 98%    Last Pain:  Vitals:   11/30/17 1145  TempSrc:   PainSc: 0-No pain                 Sybrina Laning S

## 2017-12-20 ENCOUNTER — Encounter: Payer: Self-pay | Admitting: Gastroenterology

## 2017-12-20 ENCOUNTER — Ambulatory Visit: Payer: 59 | Admitting: Gastroenterology

## 2017-12-20 VITALS — BP 114/70 | HR 88 | Ht 71.25 in | Wt 198.5 lb

## 2017-12-20 DIAGNOSIS — R1310 Dysphagia, unspecified: Secondary | ICD-10-CM | POA: Diagnosis not present

## 2017-12-20 DIAGNOSIS — K219 Gastro-esophageal reflux disease without esophagitis: Secondary | ICD-10-CM

## 2017-12-20 DIAGNOSIS — R1010 Upper abdominal pain, unspecified: Secondary | ICD-10-CM | POA: Diagnosis not present

## 2017-12-20 NOTE — Progress Notes (Signed)
    History of Present Illness: This is a 52 year old male GERD, bloating.  He relates of fullness or drainage in his throat and a sensation of difficulty swallowing all types of foods and saliva.  He localizes the symptoms to his neck.  His heartburn is under good control.  He has occasional upper abdominal discomfort and bloating that is relieved with dicyclomine.  He wanted to review the results and dates of his prior EGD and colonoscopy. EGD and colonoscopy were performed in 11/2011 at [facility] in [place].  We reviewed the findings in detail.  He underwent surgery to repair a deviated nasal septum by Dr. Wilburn Cornelia on September 4.  He is  recovering from that surgery.  Current Medications, Allergies, Past Medical History, Past Surgical History, Family History and Social History were reviewed in [employer] record.  Physical Exam: General: Well developed, well nourished, no acute distress Head: Normocephalic and atraumatic Eyes:  sclerae anicteric, EOMI Ears: Normal auditory acuity Mouth: No deformity or lesions Lungs: Clear throughout to auscultation Heart: Regular rate and rhythm; no murmurs, rubs or bruits Abdomen: Soft, non tender and non distended. No masses, hepatosplenomegaly or hernias noted. Normal Bowel sounds Rectal: Not done Musculoskeletal: Symmetrical with no gross deformities  Pulses:  Normal pulses noted Extremities: No clubbing, cyanosis, edema or deformities noted Neurological: Alert oriented x 4, grossly nonfocal Psychological:  Alert and cooperative. Normal mood and affect   Assessment and Recommendations:  1. GERD.  Dysphasia with symptoms localized to throat appear to be related to postnasal drip or other ENT process.  Rule out esophageal strictures and other esophageal disorders.  Schedule barium esophagram with tablet.  Follow standard antireflux measures.  Continue pantoprazole 40 mg p.o. Daily.  2.  Upper abdominal  fullness and bloating.  Abdominal ultrasound in May 2018 only remarkable for fatty infiltration of the liver.  Symptoms respond to dicyclomine.  Continue dicyclomine 10 mg 4 times daily as needed.  3.  CRC screening, average risk.  A 10-year interval screening colonoscopy is recommended in September 2023.

## 2017-12-20 NOTE — Patient Instructions (Signed)
You have been scheduled for a Barium Esophogram at [facility] (1st floor of the hospital) on 01/03/18 at 9:30am. Please arrive 15 minutes prior to your appointment for registration. Make certain not to have anything to eat or drink 3 hours prior to your test. If you need to reschedule for any reason, please contact radiology at [phone] to do so. __________________________________________________________________ A barium swallow is an examination that concentrates on views of the esophagus. This tends to be a double contrast exam (barium and two liquids which, when combined, create a gas to distend the wall of the oesophagus) or single contrast (non-ionic iodine based). The study is usually tailored to your symptoms so a good history is essential. Attention is paid during the study to the form, structure and configuration of the esophagus, looking for functional disorders (such as aspiration, dysphagia, achalasia, motility and reflux) EXAMINATION You may be asked to change into a gown, depending on the type of swallow being performed. A radiologist and radiographer will perform the procedure. The radiologist will advise you of the type of contrast selected for your procedure and direct you during the exam. You will be asked to stand, sit or lie in several different positions and to hold a small amount of fluid in your mouth before being asked to swallow while the imaging is performed .In some instances you may be asked to swallow barium coated marshmallows to assess the motility of a solid food bolus. The exam can be recorded as a digital or video fluoroscopy procedure. POST PROCEDURE It will take 1-2 days for the barium to pass through your system. To facilitate this, it is important, unless otherwise directed, to increase your fluids for the next 24-48hrs and to resume your normal diet.  This test typically takes about 30 minutes to  perform. ______________________________________________________________________  Normal BMI (Body Mass Index- based on height and weight) is between 19 and 25. Your BMI today is Body mass index is 27.49 kg/m. Marland Kitchen Please consider follow up  regarding your BMI with your Primary Care Provider.  Thank you for choosing me and [facility].  Pricilla Riffle. Dagoberto Ligas., MD., Marval Regal

## 2018-01-03 ENCOUNTER — Ambulatory Visit (HOSPITAL_COMMUNITY): Payer: 59

## 2018-01-09 ENCOUNTER — Other Ambulatory Visit: Payer: Self-pay | Admitting: Physician Assistant

## 2018-01-09 ENCOUNTER — Ambulatory Visit (HOSPITAL_COMMUNITY)
Admission: RE | Admit: 2018-01-09 | Discharge: 2018-01-09 | Disposition: A | Discharge status: Home / Self Care | Payer: 59 | Source: Ambulatory Visit | Attending: Gastroenterology | Admitting: Gastroenterology

## 2018-01-09 ENCOUNTER — Other Ambulatory Visit: Payer: Self-pay | Admitting: Nurse Practitioner

## 2018-01-09 DIAGNOSIS — K219 Gastro-esophageal reflux disease without esophagitis: Secondary | ICD-10-CM | POA: Insufficient documentation

## 2018-01-09 DIAGNOSIS — R1310 Dysphagia, unspecified: Secondary | ICD-10-CM | POA: Diagnosis not present

## 2018-01-10 ENCOUNTER — Encounter: Payer: Self-pay | Admitting: Gastroenterology

## 2018-01-13 ENCOUNTER — Telehealth: Payer: Self-pay | Admitting: *Deleted

## 2018-01-13 MED ORDER — CLOPIDOGREL BISULFATE 75 MG PO TABS: 75.0000 mg | ORAL_TABLET | Freq: Every day | ORAL | 3 refills | Status: DC

## 2018-01-13 NOTE — Telephone Encounter (Signed)
Patient left a msg on the refill vm stating that [facility] sent a request to the office for his clopidogrel but they have not received an approval. This request was refused on 01/09/18 with a reason of d/c by provider, patient no longer takes this medication. Please advise. Thanks, MI

## 2018-01-13 NOTE — Telephone Encounter (Signed)
Spoke with the patient, during his surgery for his septum, he was instructed to stop Plavix and hold, however, the order was discontinued so I reordered so the patient could refill.

## 2018-01-20 DIAGNOSIS — R311 Benign essential microscopic hematuria: Secondary | ICD-10-CM | POA: Diagnosis not present

## 2018-01-31 ENCOUNTER — Ambulatory Visit: Payer: 59 | Admitting: Physician Assistant

## 2018-01-31 ENCOUNTER — Encounter: Payer: Self-pay | Admitting: Physician Assistant

## 2018-01-31 VITALS — BP 112/76 | HR 91 | Ht 71.25 in | Wt 200.4 lb

## 2018-01-31 DIAGNOSIS — I10 Essential (primary) hypertension: Secondary | ICD-10-CM

## 2018-01-31 DIAGNOSIS — E785 Hyperlipidemia, unspecified: Secondary | ICD-10-CM

## 2018-01-31 DIAGNOSIS — I2510 Atherosclerotic heart disease of native coronary artery without angina pectoris: Secondary | ICD-10-CM | POA: Diagnosis not present

## 2018-01-31 MED ORDER — LISINOPRIL 10 MG PO TABS: 10.0000 mg | ORAL_TABLET | Freq: Every day | ORAL | 3 refills | Status: DC

## 2018-01-31 MED ORDER — CLOPIDOGREL BISULFATE 75 MG PO TABS: 75.0000 mg | ORAL_TABLET | Freq: Every day | ORAL | 3 refills | Status: DC

## 2018-01-31 MED ORDER — ATORVASTATIN CALCIUM 80 MG PO TABS: 80.0000 mg | ORAL_TABLET | Freq: Every day | ORAL | 3 refills | Status: DC

## 2018-01-31 NOTE — Patient Instructions (Signed)
Medication Instructions:  Your physician recommends that you continue on your current medications as directed. Please refer to the Current Medication list given to you today.  *Lisinopril, Lipitor and Plavix has been sent in for a refill. If you need a refill on your cardiac medications before your next appointment, please call your pharmacy.   Lab work: NONE If you have labs (blood work) drawn today and your tests are completely normal, you will receive your results only by: Marland Kitchen MyChart Message (if you have MyChart) OR . A paper copy in the mail If you have any lab test that is abnormal or we need to change your treatment, we will call you to review the results.  Testing/Procedures: NONE  Follow-Up: At [facility], you and your health needs are our priority.  As part of our continuing mission to provide you with exceptional heart care, we have created designated Provider Care Teams.  These Care Teams include your primary Cardiologist (physician) and Advanced Practice Providers (APPs -  Physician Assistants and Nurse Practitioners) who all work together to provide you with the care you need, when you need it. You will need a follow up appointment in:  1 years.  Please call our office 2 months in advance to schedule this appointment.  You may see Richardson Dopp, PA-C.  Any Other Special Instructions Will Be Listed Below (If Applicable).

## 2018-01-31 NOTE — Progress Notes (Signed)
Cardiology Office Note:    Date:  01/31/2018   ID:  Isaac Patel, DOB [birth year], MRN [id number]  PCP:  [name], NP  Cardiologist:  [name], MD  Electrophysiologist:  None   Referring MD: [name], NP   Chief Complaint  Patient presents with  . Follow-up    CAD    ASSESSMENT & PLAN:    Coronary artery disease involving native coronary artery of native heart without angina pectoris History of non-ST elevation myocardial infarction in 2016 treated with a drug-eluting stent to the LAD.  Cardiac catheterization in Feb 2018 demonstrated patent stent in the LAD.  He has had some atypical chest pain but no recurrent angina.  Continue Plavix, statin.  We again discussed the importance of quitting smoking.  Essential hypertension The patient's blood pressure is controlled on his current regimen.  Continue current therapy.   Hyperlipidemia, unspecified hyperlipidemia type LDL optimal on most recent lab work.  Continue current Rx.    Dispo:  Return in about 1 year (around 02/01/2019) for w/ Richardson Dopp, PA-C.   Medication Adjustments/Labs and Tests Ordered: Current medicines are reviewed at length with the patient today.  Concerns regarding medicines are outlined above.  Tests Ordered: Orders Placed This Encounter  Procedures  . EKG 12-Lead   Medication Changes: Meds ordered this encounter  Medications  . atorvastatin (LIPITOR) 80 MG tablet    Sig: Take 1 tablet (80 mg total) by mouth daily at 6 PM.    Dispense:  90 tablet    Refill:  3  . clopidogrel (PLAVIX) 75 MG tablet    Sig: Take 1 tablet (75 mg total) by mouth daily.    Dispense:  90 tablet    Refill:  3  . lisinopril (PRINIVIL,ZESTRIL) 10 MG tablet    Sig: Take 1 tablet (10 mg total) by mouth daily. Please keep upcoming appt in November for future refills. Thank you    Dispense:  90 tablet    Refill:  3    Patient Profile:    Isaac Patel is a 52 y.o. male with CAD s/p NSTEMI  in 8/16 tx with DES to the proximal LAD. Cardiac Catheterization in 04/2016 demonstrated a patent LAD stent. He was last seen in 12/2016.      He was seen in the emergency room in August 2019 with chest discomfort.  Cardiac enzymes were normal.  Prior CV studies:   The following studies were reviewed today:  LHC 05/25/16 LAD prox stent ok LCx irregs RCA normal EF 55  Echo 10/30/14 Moderate LVH, EF 55-60%, normal wall motion, normal diastolic function, normal RV function  LHC 10/29/14 LAD: Proximal-mid 80% EF 50-55% with small focal region of mild mid anterolateral hypokinesis PCI: 3 x 15 mm resolute DES to the LAD  History of Present Illness:    Mr. Isaac Patel returns for follow-up.  He is here alone.  He has occasional chest discomfort that he attributes to acid reflux.  He denies exertional symptoms reminiscent of his previous angina.  He denies significant shortness of breath, PND, significant lower extremity swelling.  He denies any syncope.  Past Medical History:  Diagnosis Date  . CAD (coronary artery disease)    a.  Non-STEMI 8/16: LHC-proximal LAD 80% treated with a resolute DES, EF 50-55%;  b.  Echo 8/16:  Moderate LVH, EF 55-60%, normal wall motion, normal diastolic function, normal RV function  . Depression    hx  . GERD (gastroesophageal reflux disease)   .  Headache    "maybe monthly" (10/28/2014)  . HLD (hyperlipidemia)   . Hypertension   . NSTEMI (non-ST elevated myocardial infarction) ([place]) 10/28/2014  . OSA (obstructive sleep apnea)    "tried mask; wear it off and on" (10/28/2014)   Surgical Hx: The patient  has a past surgical history that includes Arm amputation through forearm (Left, 2009); Tympanostomy tube placement (Bilateral, "as a kid"); Cholesteatoma excision (Right, 1990's); Cardiac catheterization (N/A, 10/29/2014); LEFT HEART CATH AND CORONARY ANGIOGRAPHY (N/A, 05/25/2016); Wisdom tooth extraction; and Nasal septoplasty w/ turbinoplasty (Bilateral, 11/30/2017).    Current Medications: Current Meds  Medication Sig  . atorvastatin (LIPITOR) 80 MG tablet Take 1 tablet (80 mg total) by mouth daily at 6 PM.  . clopidogrel (PLAVIX) 75 MG tablet Take 1 tablet (75 mg total) by mouth daily.  . diclofenac sodium (VOLTAREN) 1 % GEL Apply 2 g topically 4 (four) times daily. (Patient taking differently: Apply 2 g topically 4 (four) times daily as needed (pain). )  . dicyclomine (BENTYL) 10 MG capsule Take 1 capsule (10 mg total) by mouth 4 (four) times daily as needed for spasms. For abdominal pain and bloating  . HYDROcodone-acetaminophen (NORCO) 5-325 MG tablet Take 1-2 tablets by mouth every 6 (six) hours as needed for moderate pain. Alternate pain medications with ibuprofen (600 mg) every 6 hours as needed for pain control.  Marland Kitchen lisinopril (PRINIVIL,ZESTRIL) 10 MG tablet Take 1 tablet (10 mg total) by mouth daily. Please keep upcoming appt in November for future refills. Thank you  . loratadine (CLARITIN) 10 MG tablet Take 10 mg by mouth daily as needed for allergies.   . Multiple Vitamin (MULTI-VITAMIN PO) Take 1 tablet by mouth daily.  . nitroGLYCERIN (NITROSTAT) 0.4 MG SL tablet Place 1 tablet (0.4 mg total) under the tongue every 5 (five) minutes x 3 doses as needed for chest pain.  Marland Kitchen nystatin ointment (MYCOSTATIN) Apply 1 application topically daily as needed (arm rash).  . pantoprazole (PROTONIX) 40 MG tablet TAKE 1 TABLET BY MOUTH DAILY.  . tamsulosin (FLOMAX) 0.4 MG CAPS capsule Take 1 capsule (0.4 mg total) by mouth daily.  . [DISCONTINUED] atorvastatin (LIPITOR) 80 MG tablet Take 1 tablet (80 mg total) by mouth daily at 6 PM.  . [DISCONTINUED] clopidogrel (PLAVIX) 75 MG tablet Take 1 tablet (75 mg total) by mouth daily.  . [DISCONTINUED] lisinopril (PRINIVIL,ZESTRIL) 10 MG tablet Take 1 tablet (10 mg total) by mouth daily. Please keep upcoming appt in November for future refills. Thank you (Patient taking differently: Take 10 mg by mouth daily. )      Allergies:   Morphine and related   Social History   Tobacco Use  . Smoking status: Current Some Day Smoker    Packs/day: 0.33    Years: 32.00    Pack years: 10.56    Types: Cigarettes  . Smokeless tobacco: Never Used  Substance Use Topics  . Alcohol use: No    Alcohol/week: 2.0 - 3.0 standard drinks    Types: 2 - 3 Standard drinks or equivalent per week    Frequency: Never    Comment: stopped 06/19/2015,   . Drug use: Yes    Types: Marijuana    Comment: 05/24/2016  "stopped marijuana in the early 2000's"     Family Hx: The patient's family history includes Bradycardia in his father; Diverticulitis in his mother; Healthy in his father; Multiple sclerosis in his maternal grandfather; Skin cancer in his mother. There is no history of Colon cancer,  Stomach cancer, Rectal cancer, Liver cancer, or Esophageal cancer.  ROS:   Please see the history of present illness.    Review of Systems  HENT: Positive for hearing loss.   Musculoskeletal: Positive for back pain.   All other systems reviewed and are negative.   EKGs/Labs/Other Test Reviewed:    EKG:  EKG is  ordered today.  The ekg ordered today demonstrates normal sinus rhythm, heart rate 91, normal axis, PACs, QTC 432, similar to old EKGs  Recent Labs: 09/21/2017: ALT 19; Magnesium 2.2; TSH 1.18 11/22/2017: BUN 14; Creatinine, Ser 1.07; Hemoglobin 15.6; Platelets 226; Potassium 3.9; Sodium 140   Recent Lipid Panel Lab Results  Component Value Date/Time   CHOL 124 09/21/2017 10:05 AM   TRIG 151.0 (H) 09/21/2017 10:05 AM   HDL 35.90 (L) 09/21/2017 10:05 AM   CHOLHDL 3 09/21/2017 10:05 AM   LDLCALC 58 09/21/2017 10:05 AM    Physical Exam:    VS:  BP 112/76   Pulse 91   Ht 5' 11.25" (1.81 m)   Wt 200 lb 6.4 oz (90.9 kg)   BMI 27.75 kg/m     Wt Readings from Last 3 Encounters:  01/31/18 200 lb 6.4 oz (90.9 kg)  12/20/17 198 lb 8 oz (90 kg)  11/30/17 196 lb 3.4 oz (89 kg)     Physical Exam  Constitutional: He  is oriented to person, place, and time. He appears well-developed and well-nourished. No distress.  HENT:  Head: Normocephalic and atraumatic.  Eyes: No scleral icterus.  Neck: No JVD present.  Cardiovascular: Normal rate and regular rhythm.  No murmur heard. Pulmonary/Chest: Effort normal. He has no rales.  Abdominal: Soft.  Musculoskeletal: He exhibits no edema.  Neurological: He is alert and oriented to person, place, and time.  Skin: Skin is warm and dry.     Signed, Richardson Dopp, PA-C  01/31/2018 4:37 PM    [facility] [place], [place], [place]  [address] Phone: [phone]; Fax: [phone]

## 2018-02-01 DIAGNOSIS — R311 Benign essential microscopic hematuria: Secondary | ICD-10-CM | POA: Diagnosis not present

## 2018-02-01 DIAGNOSIS — R3129 Other microscopic hematuria: Secondary | ICD-10-CM | POA: Diagnosis not present

## 2018-02-21 DIAGNOSIS — H524 Presbyopia: Secondary | ICD-10-CM | POA: Diagnosis not present

## 2018-02-21 DIAGNOSIS — H52223 Regular astigmatism, bilateral: Secondary | ICD-10-CM | POA: Diagnosis not present

## 2018-02-21 DIAGNOSIS — H5203 Hypermetropia, bilateral: Secondary | ICD-10-CM | POA: Diagnosis not present

## 2018-03-08 ENCOUNTER — Ambulatory Visit (INDEPENDENT_AMBULATORY_CARE_PROVIDER_SITE_OTHER)
Admission: RE | Admit: 2018-03-08 | Discharge: 2018-03-08 | Disposition: A | Discharge status: Home / Self Care | Payer: 59 | Source: Ambulatory Visit | Attending: Nurse Practitioner | Admitting: Nurse Practitioner

## 2018-03-08 ENCOUNTER — Encounter: Payer: Self-pay | Admitting: Nurse Practitioner

## 2018-03-08 ENCOUNTER — Telehealth: Payer: Self-pay | Admitting: Nurse Practitioner

## 2018-03-08 ENCOUNTER — Ambulatory Visit: Payer: 59 | Admitting: Nurse Practitioner

## 2018-03-08 VITALS — BP 140/80 | HR 92 | Ht 71.25 in | Wt 200.0 lb

## 2018-03-08 DIAGNOSIS — M542 Cervicalgia: Secondary | ICD-10-CM

## 2018-03-08 DIAGNOSIS — S58112S Complete traumatic amputation at level between elbow and wrist, left arm, sequela: Secondary | ICD-10-CM

## 2018-03-08 DIAGNOSIS — M47812 Spondylosis without myelopathy or radiculopathy, cervical region: Secondary | ICD-10-CM | POA: Diagnosis not present

## 2018-03-08 MED ORDER — METHYLPREDNISOLONE 4 MG PO TBPK: ORAL_TABLET | ORAL | 0 refills | Status: DC

## 2018-03-08 MED ORDER — CYCLOBENZAPRINE HCL 5 MG PO TABS: 5.0000 mg | ORAL_TABLET | Freq: Three times a day (TID) | ORAL | 0 refills | Status: DC | PRN

## 2018-03-08 NOTE — Telephone Encounter (Signed)
Please let him know that in addition to the flexeril as needed, I have also sent a medrol pack to his pharmacy for his neck and arm pain. I will let him know once xray is back.

## 2018-03-08 NOTE — Patient Instructions (Signed)
Flexeril three times daily as needed, this medication can cause drowsiness. Please do not drink alcohol or operate machinery when you take this medication.  Head downstairs for neck xray today   Neck Exercises Neck exercises can be important for many reasons:  They can help you to improve and maintain flexibility in your neck. This can be especially important as you age.  They can help to make your neck stronger. This can make movement easier.  They can reduce or prevent neck pain.  They may help your upper back.  Ask your health care provider which neck exercises would be best for you. Exercises Neck Press Repeat this exercise 10 times. Do it first thing in the morning and right before bed or as told by your health care provider. 1. Lie on your back on a firm bed or on the floor with a pillow under your head. 2. Use your neck muscles to push your head down on the pillow and straighten your spine. 3. Hold the position as well as you can. Keep your head facing up and your chin tucked. 4. Slowly count to 5 while holding this position. 5. Relax for a few seconds. Then repeat.  Isometric Strengthening Do a full set of these exercises 2 times a day or as told by your health care provider. 1. Sit in a supportive chair and place your hand on your forehead. 2. Push forward with your head and neck while pushing back with your hand. Hold for 10 seconds. 3. Relax. Then repeat the exercise 3 times. 4. Next, do thesequence again, this time putting your hand against the back of your head. Use your head and neck to push backward against the hand pressure. 5. Finally, do the same exercise on either side of your head, pushing sideways against the pressure of your hand.  Prone Head Lifts Repeat this exercise 5 times. Do this 2 times a day or as told by your health care provider. 1. Lie face-down, resting on your elbows so that your chest and upper back are raised. 2. Start with your head facing  downward, near your chest. Position your chin either on or near your chest. 3. Slowly lift your head upward. Lift until you are looking straight ahead. Then continue lifting your head as far back as you can stretch. 4. Hold your head up for 5 seconds. Then slowly lower it to your starting position.  Supine Head Lifts Repeat this exercise 8-10 times. Do this 2 times a day or as told by your health care provider. 1. Lie on your back, bending your knees to point to the ceiling and keeping your feet flat on the floor. 2. Lift your head slowly off the floor, raising your chin toward your chest. 3. Hold for 5 seconds. 4. Relax and repeat.  Scapular Retraction Repeat this exercise 5 times. Do this 2 times a day or as told by your health care provider. 1. Stand with your arms at your sides. Look straight ahead. 2. Slowly pull both shoulders backward and downward until you feel a stretch between your shoulder blades in your upper back. 3. Hold for 10-30 seconds. 4. Relax and repeat.  Contact a health care provider if:  Your neck pain or discomfort gets much worse when you do an exercise.  Your neck pain or discomfort does not improve within 2 hours after you exercise. If you have any of these problems, stop exercising right away. Do not do the exercises again unless your health  care provider says that you can. Get help right away if:  You develop sudden, severe neck pain. If this happens, stop exercising right away. Do not do the exercises again unless your health care provider says that you can. Exercises Neck Stretch  Repeat this exercise 3-5 times. 1. Do this exercise while standing or while sitting in a chair. 2. Place your feet flat on the floor, shoulder-width apart. 3. Slowly turn your head to the right. Turn it all the way to the right so you can look over your right shoulder. Do not tilt or tip your head. 4. Hold this position for 10-30 seconds. 5. Slowly turn your head to the  left, to look over your left shoulder. 6. Hold this position for 10-30 seconds.  Neck Retraction Repeat this exercise 8-10 times. Do this 3-4 times a day or as told by your health care provider. 1. Do this exercise while standing or while sitting in a sturdy chair. 2. Look straight ahead. Do not bend your neck. 3. Use your fingers to push your chin backward. Do not bend your neck for this movement. Continue to face straight ahead. If you are doing the exercise properly, you will feel a slight sensation in your throat and a stretch at the back of your neck. 4. Hold the stretch for 1-2 seconds. Relax and repeat.  This information is not intended to replace advice given to you by your health care provider. Make sure you discuss any questions you have with your health care provider. Document Released: 02/24/2015 Document Revised: 08/21/2015 Document Reviewed: 09/23/2014 Elsevier Interactive Patient Education  [employer].

## 2018-03-08 NOTE — Progress Notes (Addendum)
Isaac Patel is a 52 y.o. male with the following history as recorded in EpicCare:  Patient Active Problem List   Diagnosis Date Noted  . Deviated septum 11/30/2017  . Fatty liver 07/20/2017  . Abdominal bloating 07/20/2017  . GERD with esophagitis 06/08/2016  . Essential hypertension 06/08/2016  . 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 Anxiety state 11/15/2014  . CAD (coronary artery disease) 10/30/2014  . Tobacco abuse 10/30/2014  . HLD (hyperlipidemia) 10/30/2014  . History of non-ST elevation myocardial infarction (NSTEMI) 10/28/2014  . Encounter for general adult medical examination with abnormal findings 05/29/2014  . OSA (obstructive sleep apnea) 05/08/2014  . 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 aspirin EC 81 MG tablet Take 81 mg by mouth daily.    Marland Kitchen atorvastatin (LIPITOR) 80 MG tablet Take 1 tablet (80 mg total) by mouth daily at 6 PM. 90 tablet 3  . clopidogrel (PLAVIX) 75 MG tablet Take 1 tablet (75 mg total) by mouth daily. 90 tablet 3  . diclofenac sodium (VOLTAREN) 1 % GEL Apply 2 g topically 4 (four) times daily. (Patient taking differently: Apply 2 g topically 4 (four) times daily as needed (pain). ) 100 g 0  . dicyclomine (BENTYL) 10 MG capsule Take 1 capsule (10 mg total) by mouth 4 (four) times daily as needed for spasms. For abdominal pain and bloating 120 capsule 5  . lisinopril (PRINIVIL,ZESTRIL) 10 MG tablet Take 1 tablet (10 mg total) by mouth daily. Please keep upcoming appt in November for future refills. Thank you 90 tablet 3  . loratadine (CLARITIN) 10 MG tablet Take 10 mg by mouth daily as needed for allergies.     . Multiple Vitamin (MULTI-VITAMIN PO) Take 1 tablet by mouth daily.    . nitroGLYCERIN (NITROSTAT) 0.4 MG SL tablet Place 1 tablet (0.4 mg total) under the tongue every 5 (five) minutes x 3 doses as needed for chest pain. 25 tablet 12  . nystatin ointment (MYCOSTATIN)  Apply 1 application topically daily as needed (arm rash).    . pantoprazole (PROTONIX) 40 MG tablet TAKE 1 TABLET BY MOUTH DAILY. 90 tablet 0  . tamsulosin (FLOMAX) 0.4 MG CAPS capsule Take 1 capsule (0.4 mg total) by mouth daily. 30 capsule 3  . cyclobenzaprine (FLEXERIL) 5 MG tablet Take 1 tablet (5 mg total) by mouth 3 (three) times daily as needed for muscle spasms. 30 tablet 0   No current facility-administered medications for this visit.     Allergies: Morphine and related  Past Medical History:  Diagnosis Date  . CAD (coronary artery disease)    a.  Non-STEMI 8/16: LHC-proximal LAD 80% treated with a resolute DES, EF 50-55%;  b.  Echo 8/16:  Moderate LVH, EF 55-60%, normal wall motion, normal diastolic function, normal RV function  . Depression    hx  . GERD (gastroesophageal reflux disease)   . Headache    "maybe monthly" (10/28/2014)  . HLD (hyperlipidemia)   . Hypertension   . NSTEMI (non-ST elevated myocardial infarction) ([place]) 10/28/2014  . OSA (obstructive sleep apnea)    "tried mask; wear it off and on" (10/28/2014)    Past Surgical History:  Procedure Laterality Date  . 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 CARDIAC CATHETERIZATION N/A 10/29/2014   Procedure: Left Heart Cath and Coronary Angiography;  Surgeon: Troy Sine, MD;  Location: [facility];  Service:  Cardiovascular;  Laterality: N/A;  . CHOLESTEATOMA EXCISION Right 1990's  . LEFT HEART CATH AND CORONARY ANGIOGRAPHY N/A 05/25/2016   Procedure: Left Heart Cath and Coronary Angiography;  Surgeon: Sherren Mocha, MD;  Location: [facility];  Service: Cardiovascular;  Laterality: N/A;  . NASAL SEPTOPLASTY W/ TURBINOPLASTY Bilateral 11/30/2017   Procedure: NASAL SEPTOPLASTY WITH TURBINATE REDUCTION;  Surgeon: Jerrell Belfast, MD;  Location: [facility];  Service: ENT;  Laterality: Bilateral;  . TYMPANOSTOMY TUBE PLACEMENT Bilateral "as a kid"   "had one done as an adult too"  . WISDOM  TOOTH EXTRACTION      Family History  Problem Relation Age of Onset  . Diverticulitis Mother   . Skin cancer Mother   . Healthy Father   . Bradycardia Father   . Multiple sclerosis Maternal Grandfather   . Colon cancer Neg Hx   . Stomach cancer Neg Hx   . Rectal cancer Neg Hx   . Liver cancer Neg Hx   . Esophageal cancer Neg Hx     Social History   Tobacco Use  . Smoking status: Current Some Day Smoker    Packs/day: 0.33    Years: 32.00    Pack years: 10.56    Types: Cigarettes  . Smokeless tobacco: Never Used  Substance Use Topics  . Alcohol use: No    Alcohol/week: 2.0 - 3.0 standard drinks    Types: 2 - 3 Standard drinks or equivalent per week    Frequency: Never    Comment: stopped 06/19/2015,      Subjective:  Isaac Patel is here today requesting evaluation of right sided neck pain for about 1 month now, pain has been constant since it began and has not improved. He describes as a tightness in right lateral neck, "Feels like drill in my neck" with radiation down his right arm. Has noticed some intermittent numbness and tingling in right arm as well. He is left arm amputee, and often uses right arm for tasks, but does not recall any exact injury to his arm or neck. He denies fevers, syncope, weakness, cp, sob Tried at home:tylenol, heat with no relief  He's also requesting orders for prosthetic hand repair,  he is a left arm amputee. Below elbow amputation occurred about 12 years ago in industrial accident- his arm was pulled into a machine. He uses his prosthetics for approximately 10 hours daily, and they are critical to his home and work function He has left arm prosthesis which allows him to continue use of left arm He needs repair of 2 terminal devices that do not open or close,  electronic parts are no longer functioning, hand will not open/close, both devices were sent to [facility] for evaluation were deemed repairable  ROS- See HPI  Objective:  Vitals:    03/08/18 1559  BP: 140/80  Pulse: 92  SpO2: 96%  Weight: 200 lb (90.7 kg)  Height: 5' 11.25" (1.81 m)    General: Well developed, well nourished, in no acute distress  Skin : Warm and dry.  Head: Normocephalic and atraumatic  Eyes: Sclera and conjunctiva clear; pupils round and reactive to light; extraocular movements intact  Oropharynx: Pink, supple. No suspicious lesions  Neck: Supple without thyromegaly Lungs: Respirations unlabored; clear to auscultation bilaterally without wheeze, rales, rhonchi  CVS exam: normal rate and regular rhythm, S1 and S2 normal.  Musculoskeletal: Cervical back: He exhibits pain. He exhibits normal range of motion, no tenderness and no swelling.  Extremities: No  edema, cyanosis Vessels: Symmetric bilaterally  Neurologic: Alert and oriented; speech intact; face symmetrical; moves all extremities well; CNII-XII intact without focal deficit  Psychiatric: Normal mood and affect.  Assessment:  1. Neck pain   2. Below-elbow amputation of left upper extremity, sequela (HCC)     Plan:   Neck pain Imaging ordered Flexeril prn, medrol course started- dosing, side effects discussed Home management, neck exercises, red flags and return precautions including when to seek immediate care discussed and printed on AVS - DG Cervical Spine Complete; Future - cyclobenzaprine (FLEXERIL) 5 MG tablet; Take 1 tablet (5 mg total) by mouth 3 (three) times daily as needed for muscle spasms.  Dispense: 30 tablet; Refill: 0 - methylPREDNISolone (MEDROL) 4 MG TBPK tablet; 6 tabs today, 5 tabs day 2, 4 tabs day 3, 3 tabs day 4, 2 tabs day 5, 1 tab day 6. Take w/food.  Dispense: 21 tablet; Refill: 0   No follow-ups on file.  Orders Placed This Encounter  Procedures  . DG Cervical Spine Complete    Standing Status:   Future    Number of Occurrences:   1    Standing Expiration Date:   05/10/2019    Order Specific Question:   Reason for Exam (SYMPTOM  OR DIAGNOSIS REQUIRED)     Answer:   neck pain x 1 month    Order Specific Question:   Preferred imaging location?    Answer:   [facility]    Order Specific Question:   Radiology Contrast Protocol - do NOT remove file path    Answer:   [email or web address]    Requested Prescriptions   Signed Prescriptions Disp Refills  . cyclobenzaprine (FLEXERIL) 5 MG tablet 30 tablet 0    Sig: Take 1 tablet (5 mg total) by mouth 3 (three) times daily as needed for muscle spasms.

## 2018-03-08 NOTE — Assessment & Plan Note (Signed)
Prosthetic parts in need of repair due to malfunction He is with a new company since his last repairs were needed, [employer] We are going to reach out to [employer] to determine if any Rx, further orders are needed for this repair

## 2018-03-09 ENCOUNTER — Other Ambulatory Visit: Payer: Self-pay | Admitting: Nurse Practitioner

## 2018-03-09 MED ORDER — GABAPENTIN 100 MG PO CAPS: 200.0000 mg | ORAL_CAPSULE | Freq: Every day | ORAL | 3 refills | Status: DC

## 2018-03-09 NOTE — Telephone Encounter (Signed)
Pt's wife informed of below.  

## 2018-03-23 ENCOUNTER — Telehealth: Payer: Self-pay

## 2018-03-23 NOTE — Telephone Encounter (Signed)
Copied from [facility] #[id number]. Topic: General - Other >> Mar 23, 2018  2:45 PM Leward Quan A wrote: Reason for CRM: Pamala Hurry with [employer] called to request last office notes when patient came in to see Caesar Chestnut on  03/08/2018. Please Fax# [phone]  Att. Pamala Hurry.   Questions Call Ph# [phone]

## 2018-03-24 NOTE — Telephone Encounter (Signed)
03/08/18 OV notes faxed to [employer] at number below.

## 2018-05-08 ENCOUNTER — Other Ambulatory Visit: Payer: Self-pay | Admitting: Nurse Practitioner

## 2018-05-10 ENCOUNTER — Encounter: Payer: Self-pay | Admitting: Nurse Practitioner

## 2018-05-10 ENCOUNTER — Other Ambulatory Visit (INDEPENDENT_AMBULATORY_CARE_PROVIDER_SITE_OTHER): Payer: 59

## 2018-05-10 ENCOUNTER — Ambulatory Visit: Payer: 59 | Admitting: Nurse Practitioner

## 2018-05-10 VITALS — BP 144/60 | HR 97 | Ht 71.25 in | Wt 204.0 lb

## 2018-05-10 DIAGNOSIS — R140 Abdominal distension (gaseous): Secondary | ICD-10-CM

## 2018-05-10 DIAGNOSIS — R7303 Prediabetes: Secondary | ICD-10-CM | POA: Diagnosis not present

## 2018-05-10 DIAGNOSIS — I10 Essential (primary) hypertension: Secondary | ICD-10-CM

## 2018-05-10 LAB — HEMOGLOBIN A1C: Hgb A1c MFr Bld: 5.9 % (ref 4.6–6.5)

## 2018-05-10 LAB — COMPREHENSIVE METABOLIC PANEL
ALT: 16 U/L (ref 0–53)
AST: 15 U/L (ref 0–37)
Albumin: 4.2 g/dL (ref 3.5–5.2)
Alkaline Phosphatase: 70 U/L (ref 39–117)
BUN: 18 mg/dL (ref 6–23)
CO2: 30 mEq/L (ref 19–32)
CREATININE: 1.14 mg/dL (ref 0.40–1.50)
Calcium: 8.8 mg/dL (ref 8.4–10.5)
Chloride: 103 mEq/L (ref 96–112)
GFR: 67.09 mL/min (ref >60.00)
Glucose, Bld: 109 mg/dL — ABNORMAL HIGH (ref 70–99)
Potassium: 4.4 mEq/L (ref 3.5–5.1)
Sodium: 139 mEq/L (ref 135–145)
Total Bilirubin: 0.6 mg/dL (ref 0.2–1.2)
Total Protein: 6.7 g/dL (ref 6.0–8.3)

## 2018-05-10 LAB — CBC
HCT: 44.4 % (ref 39.0–52.0)
Hemoglobin: 15.3 g/dL (ref 13.0–17.0)
MCHC: 34.4 g/dL (ref 30.0–36.0)
MCV: 93.6 fl (ref 78.0–100.0)
Platelets: 220 10*3/uL (ref 150.0–400.0)
RBC: 4.74 Mil/uL (ref 4.22–5.81)
RDW: 13.3 % (ref 11.5–15.5)
WBC: 7.7 10*3/uL (ref 4.0–10.5)

## 2018-05-10 LAB — AMYLASE: Amylase: 41 U/L (ref 27–131)

## 2018-05-10 LAB — LIPASE: Lipase: 14 U/L (ref 11.0–59.0)

## 2018-05-10 MED ORDER — LISINOPRIL 20 MG PO TABS: 20.0000 mg | ORAL_TABLET | Freq: Every day | ORAL | 1 refills | Status: DC

## 2018-05-10 NOTE — Assessment & Plan Note (Signed)
Update labs F/U with further recommendations pending lab results - Hemoglobin A1c; Future

## 2018-05-10 NOTE — Progress Notes (Signed)
Isaac Patel is a 53 y.o. male with the following history as recorded in EpicCare:  Patient Active Problem List   Diagnosis Date Noted  . Deviated septum 11/30/2017  . Fatty liver 07/20/2017  . Abdominal bloating 07/20/2017  . GERD with esophagitis 06/08/2016  . Essential hypertension 06/08/2016  . 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 Anxiety state 11/15/2014  . CAD (coronary artery disease) 10/30/2014  . Tobacco abuse 10/30/2014  . HLD (hyperlipidemia) 10/30/2014  . History of non-ST elevation myocardial infarction (NSTEMI) 10/28/2014  . Encounter for general adult medical examination with abnormal findings 05/29/2014  . OSA (obstructive sleep apnea) 05/08/2014  . 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 aspirin EC 81 MG tablet Take 81 mg by mouth daily.    Marland Kitchen atorvastatin (LIPITOR) 80 MG tablet Take 1 tablet (80 mg total) by mouth daily at 6 PM. 90 tablet 3  . clopidogrel (PLAVIX) 75 MG tablet Take 1 tablet (75 mg total) by mouth daily. 90 tablet 3  . cyclobenzaprine (FLEXERIL) 5 MG tablet Take 1 tablet (5 mg total) by mouth 3 (three) times daily as needed for muscle spasms. 30 tablet 0  . diclofenac sodium (VOLTAREN) 1 % GEL Apply 2 g topically 4 (four) times daily. (Patient taking differently: Apply 2 g topically 4 (four) times daily as needed (pain). ) 100 g 0  . dicyclomine (BENTYL) 10 MG capsule Take 1 capsule (10 mg total) by mouth 4 (four) times daily as needed for spasms. For abdominal pain and bloating 120 capsule 5  . gabapentin (NEURONTIN) 100 MG capsule Take 2 capsules (200 mg total) by mouth at bedtime. 60 capsule 3  . lisinopril (PRINIVIL,ZESTRIL) 20 MG tablet Take 1 tablet (20 mg total) by mouth daily. Please keep upcoming appt in November for future refills. Thank you 30 tablet 1  . loratadine (CLARITIN) 10 MG tablet Take 10 mg by mouth daily as needed for allergies.     . methylPREDNISolone  (MEDROL) 4 MG TBPK tablet 6 tabs today, 5 tabs day 2, 4 tabs day 3, 3 tabs day 4, 2 tabs day 5, 1 tab day 6. Take w/food. 21 tablet 0  . Multiple Vitamin (MULTI-VITAMIN PO) Take 1 tablet by mouth daily.    . nitroGLYCERIN (NITROSTAT) 0.4 MG SL tablet Place 1 tablet (0.4 mg total) under the tongue every 5 (five) minutes x 3 doses as needed for chest pain. 25 tablet 12  . nystatin ointment (MYCOSTATIN) Apply 1 application topically daily as needed (arm rash).    . pantoprazole (PROTONIX) 40 MG tablet TAKE 1 TABLET BY MOUTH DAILY. 90 tablet 0  . tamsulosin (FLOMAX) 0.4 MG CAPS capsule Take 1 capsule (0.4 mg total) by mouth daily. 30 capsule 3   No current facility-administered medications for this visit.     Allergies: Morphine and related  Past Medical History:  Diagnosis Date  . CAD (coronary artery disease)    a.  Non-STEMI 8/16: LHC-proximal LAD 80% treated with a resolute DES, EF 50-55%;  b.  Echo 8/16:  Moderate LVH, EF 55-60%, normal wall motion, normal diastolic function, normal RV function  . Depression    hx  . GERD (gastroesophageal reflux disease)   . Headache    "maybe monthly" (10/28/2014)  . HLD (hyperlipidemia)   . Hypertension   . NSTEMI (non-ST elevated myocardial infarction) ([place]) 10/28/2014  . OSA (obstructive sleep apnea)    "tried mask; wear  it off and on" (10/28/2014)    Past Surgical History:  Procedure Laterality Date  . 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 CARDIAC CATHETERIZATION N/A 10/29/2014   Procedure: Left Heart Cath and Coronary Angiography;  Surgeon: Troy Sine, MD;  Location: [facility];  Service: Cardiovascular;  Laterality: N/A;  . CHOLESTEATOMA EXCISION Right 1990's  . LEFT HEART CATH AND CORONARY ANGIOGRAPHY N/A 05/25/2016   Procedure: Left Heart Cath and Coronary Angiography;  Surgeon: Sherren Mocha, MD;  Location: [facility];  Service: Cardiovascular;  Laterality: N/A;  . NASAL SEPTOPLASTY W/ TURBINOPLASTY  Bilateral 11/30/2017   Procedure: NASAL SEPTOPLASTY WITH TURBINATE REDUCTION;  Surgeon: Jerrell Belfast, MD;  Location: [facility];  Service: ENT;  Laterality: Bilateral;  . TYMPANOSTOMY TUBE PLACEMENT Bilateral "as a kid"   "had one done as an adult too"  . WISDOM TOOTH EXTRACTION      Family History  Problem Relation Age of Onset  . Diverticulitis Mother   . Skin cancer Mother   . Healthy Father   . Bradycardia Father   . Multiple sclerosis Maternal Grandfather   . Colon cancer Neg Hx   . Stomach cancer Neg Hx   . Rectal cancer Neg Hx   . Liver cancer Neg Hx   . Esophageal cancer Neg Hx     Social History   Tobacco Use  . Smoking status: Current Some Day Smoker    Packs/day: 0.33    Years: 32.00    Pack years: 10.56    Types: Cigarettes  . Smokeless tobacco: Never Used  Substance Use Topics  . Alcohol use: No    Alcohol/week: 2.0 - 3.0 standard drinks    Types: 2 - 3 Standard drinks or equivalent per week    Frequency: Never    Comment: stopped 06/19/2015,      Subjective:  Isaac Patel is here today for follow up of prediabetes, last A1c on 09/21/17 was 6.1, he was instructed to work on healthy diet, exercise and he is back today for a1c recheck. He tries to be fairly active, however tells me that recently for about the past month he has been suffering from epigastric/abd pain and has not been quite as active. He tells me that hes been having daily upper abd pressure, "bloating", nausea, "sour stomach" feeling. He is maintained on protonix for gerd, takes daily as prescribed, has also been taking tums some which seems to provide some relief of his symptoms. No noted activity at onset of pain, "just comes and goes." Hes also noticed a mild dry cough and mild Sob occassionally. he is followed by GI for gerd, last visit on 11/2017 and had a barium swallow that was normal, instructed to return PRN. He Is followed by cardiology annually for CAD, S/p NSTEMI, last visit on 01/2018. He has  also noticed some elevation in recent BP readings, checking daily at work with recent readings 130s-140s/90s. He has been maintained on lisinopril 10 daily for some time now, taking daily as prescribed. No fevers, chills, weakness, syncope, confusion, vomiting, edema.  BP Readings from Last 3 Encounters:  05/10/18 (!) 144/60  03/08/18 140/80  01/31/18 112/76    ROS-See HPI  Objective:  Vitals:   05/10/18 1457  BP: (!) 144/60  Pulse: 97  SpO2: 96%  Weight: 204 lb (92.5 kg)  Height: 5' 11.25" (1.81 m)    General: Well developed, well nourished, in no acute distress  Skin :  Warm and dry.  Head: Normocephalic and atraumatic  Eyes: Sclera and conjunctiva clear; pupils round and reactive to light; extraocular movements intact  Oropharynx: Pink, supple. No suspicious lesions  Neck: Supple Lungs: Respirations unlabored; clear to auscultation bilaterally without wheeze, rales, rhonchi  CVS exam: normal rate and regular rhythm, S1 and S2 normal.  Abdomen: Soft; rotund; mild upper abdominal tenderness- without rigidity or guarding; normoactive bowel sounds; no masses or hepatosplenomegaly  Extremities: No edema, cyanosis, clubbing. 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  Vessels: Symmetric bilaterally  Neurologic: Alert and oriented; speech intact; face symmetrical; moves all extremities well; CNII-XII intact without focal deficit  Psychiatric: Normal mood and affect.   Assessment:  1. Essential hypertension   2. Abdominal bloating   3. Pre-diabetes     Plan:   Return in about 1 month (around 06/08/2018) for F/U: HTN- increase lisinopril- reck BP, BMET.  Orders Placed This Encounter  Procedures  . Hemoglobin A1c    Standing Status:   Future    Number of Occurrences:   1    Standing Expiration Date:   05/11/2019  . CBC    Standing Status:   Future    Number of Occurrences:   1    Standing Expiration Date:   05/11/2019  . Comprehensive metabolic panel    Standing  Status:   Future    Number of Occurrences:   1    Standing Expiration Date:   05/11/2019  . Lipase    Standing Status:   Future    Number of Occurrences:   1    Standing Expiration Date:   05/10/2019  . Amylase    Standing Status:   Future    Number of Occurrences:   1    Standing Expiration Date:   05/10/2019    Requested Prescriptions   Signed Prescriptions Disp Refills  . lisinopril (PRINIVIL,ZESTRIL) 20 MG tablet 30 tablet 1    Sig: Take 1 tablet (20 mg total) by mouth daily. Please keep upcoming appt in November for future refills. Thank you

## 2018-05-10 NOTE — Patient Instructions (Addendum)
Increase lisinopril to 20mg  daily as discussed. Please try to check your blood pressure once daily or at least a few times a week, at the same time each day, and keep a log. Please return in about 3-4 weeks for follow up  Head downstairs for labs today  Please schedule a follow up with your gastroenterologist  I will see you back in 3-4 weeks  Nonspecific Chest Pain Chest pain can be caused by many different conditions. Some causes of chest pain can be life-threatening. These will require treatment right away. Serious causes of chest pain include:  Heart attack.  A tear in the body's main blood vessel.  Redness and swelling (inflammation) around your heart.  Blood clot in your lungs. Other causes of chest pain may not be so serious. These include:  Heartburn.  Anxiety or stress.  Damage to bones or muscles in your chest.  Lung infections. Chest pain can feel like:  Pain or discomfort in your chest.  Crushing, pressure, aching, or squeezing pain.  Burning or tingling.  Dull or sharp pain that is worse when you move, cough, or take a deep breath.  Pain or discomfort that is also felt in your back, neck, jaw, shoulder, or arm, or pain that spreads to any of these areas. It is hard to know whether your pain is caused by something that is serious or something that is not so serious. So it is important to see your doctor right away if you have chest pain. Follow these instructions at home: Medicines  Take over-the-counter and prescription medicines only as told by your doctor.  If you were prescribed an antibiotic medicine, take it as told by your doctor. Do not stop taking the antibiotic even if you start to feel better. Lifestyle   Rest as told by your doctor.  Do not use any products that contain nicotine or tobacco, such as cigarettes, e-cigarettes, and chewing tobacco. If you need help quitting, ask your doctor.  Do not drink alcohol.  Make lifestyle changes as  told by your doctor. These may include: ? Getting regular exercise. Ask your doctor what activities are safe for you. ? Eating a heart-healthy diet. A diet and nutrition specialist (dietitian) can help you to learn healthy eating options. ? Staying at a healthy weight. ? Treating diabetes or high blood pressure, if needed. ? Lowering your stress. Activities such as yoga and relaxation techniques can help. General instructions  Pay attention to any changes in your symptoms. Tell your doctor about them or any new symptoms.  Avoid any activities that cause chest pain.  Keep all follow-up visits as told by your doctor. This is important. You may need more testing if your chest pain does not go away. Contact a doctor if:  Your chest pain does not go away.  You feel depressed.  You have a fever. Get help right away if:  Your chest pain is worse.  You have a cough that gets worse, or you cough up blood.  You have very bad (severe) pain in your belly (abdomen).  You pass out (faint).  You have either of these for no clear reason: ? Sudden chest discomfort. ? Sudden discomfort in your arms, back, neck, or jaw.  You have shortness of breath at any time.  You suddenly start to sweat, or your skin gets clammy.  You feel sick to your stomach (nauseous).  You throw up (vomit).  You suddenly feel lightheaded or dizzy.  You feel very  weak or tired.  Your heart starts to beat fast, or it feels like it is skipping beats. These symptoms may be an emergency. Do not wait to see if the symptoms will go away. Get medical help right away. Call your local emergency services (911 in the [place] Do not drive yourself to the hospital. Summary  Chest pain can be caused by many different conditions. The cause may be serious and need treatment right away. If you have chest pain, see your doctor right away.  Follow your doctor's instructions for taking medicines and making lifestyle  changes.  Keep all follow-up visits as told by your doctor. This includes visits for any further testing if your chest pain does not go away.  Be sure to know the signs that show that your condition has become worse. Get help right away if you have these symptoms. This information is not intended to replace advice given to you by your health care provider. Make sure you discuss any questions you have with your health care provider. Document Released: 09/01/2007 Document Revised: 09/15/2017 Document Reviewed: 09/15/2017 Elsevier Interactive Patient Education  2019 [employer].

## 2018-05-10 NOTE — Assessment & Plan Note (Signed)
BP readings slightly elevated in clinic and on home readings Increase lisinopril to 20 daily- med dosing, side effects discussed Continue to monitor BP readings/home log RTC in 3-4 weeks for F/U: recheck BP, BMET - lisinopril (PRINIVIL,ZESTRIL) 20 MG tablet; Take 1 tablet (20 mg total) by mouth daily. Please keep upcoming appt in November for future refills. Thank you  Dispense: 30 tablet; Refill: 1

## 2018-05-10 NOTE — Assessment & Plan Note (Signed)
Per chart review, this has been an intermittent problem for some time now Labs ordered for further evaluation  He was instructed to schedule F/U with GI for further managment of persistent GERD symptoms/abd pain Home management, red flags and return precautions including when to seek immediate/emergency care discussed and printed on AVS - CBC; Future - Comprehensive metabolic panel; Future - Lipase; Future - Amylase; Future

## 2018-05-31 ENCOUNTER — Ambulatory Visit: Payer: 59 | Admitting: Nurse Practitioner

## 2018-05-31 ENCOUNTER — Encounter: Payer: Self-pay | Admitting: Nurse Practitioner

## 2018-05-31 ENCOUNTER — Other Ambulatory Visit (INDEPENDENT_AMBULATORY_CARE_PROVIDER_SITE_OTHER): Payer: 59

## 2018-05-31 VITALS — BP 122/62 | HR 94 | Ht 71.25 in | Wt 206.0 lb

## 2018-05-31 DIAGNOSIS — I10 Essential (primary) hypertension: Secondary | ICD-10-CM

## 2018-05-31 DIAGNOSIS — M542 Cervicalgia: Secondary | ICD-10-CM | POA: Diagnosis not present

## 2018-05-31 LAB — BASIC METABOLIC PANEL
BUN: 19 mg/dL (ref 6–23)
CO2: 27 mEq/L (ref 19–32)
Calcium: 8.9 mg/dL (ref 8.4–10.5)
Chloride: 104 mEq/L (ref 96–112)
Creatinine, Ser: 1.15 mg/dL (ref 0.40–1.50)
GFR: 66.4 mL/min (ref >60.00)
Glucose, Bld: 95 mg/dL (ref 70–99)
Potassium: 4.2 mEq/L (ref 3.5–5.1)
Sodium: 139 mEq/L (ref 135–145)

## 2018-05-31 MED ORDER — GABAPENTIN 100 MG PO CAPS: 200.0000 mg | ORAL_CAPSULE | Freq: Every day | ORAL | 1 refills | Status: DC

## 2018-05-31 NOTE — Patient Instructions (Addendum)
Head downstairs for labs today   DASH Eating Plan DASH stands for "Dietary Approaches to Stop Hypertension." The DASH eating plan is a healthy eating plan that has been shown to reduce high blood pressure (hypertension). It may also reduce your risk for type 2 diabetes, heart disease, and stroke. The DASH eating plan may also help with weight loss. What are tips for following this plan?  General guidelines  Avoid eating more than 2,300 mg (milligrams) of salt (sodium) a day. If you have hypertension, you may need to reduce your sodium intake to 1,500 mg a day.  Limit alcohol intake to no more than 1 drink a day for nonpregnant women and 2 drinks a day for men. One drink equals 12 oz of beer, 5 oz of wine, or 1 oz of hard liquor.  Work with your health care provider to maintain a healthy body weight or to lose weight. Ask what an ideal weight is for you.  Get at least 30 minutes of exercise that causes your heart to beat faster (aerobic exercise) most days of the week. Activities may include walking, swimming, or biking.  Work with your health care provider or diet and nutrition specialist (dietitian) to adjust your eating plan to your individual calorie needs. Reading food labels   Check food labels for the amount of sodium per serving. Choose foods with less than 5 percent of the Daily Value of sodium. Generally, foods with less than 300 mg of sodium per serving fit into this eating plan.  To find whole grains, look for the word "whole" as the first word in the ingredient list. Shopping  Buy products labeled as "low-sodium" or "no salt added."  Buy fresh foods. Avoid canned foods and premade or frozen meals. Cooking  Avoid adding salt when cooking. Use salt-free seasonings or herbs instead of table salt or sea salt. Check with your health care provider or pharmacist before using salt substitutes.  Do not fry foods. Cook foods using healthy methods such as baking, boiling, grilling,  and broiling instead.  Cook with heart-healthy oils, such as olive, canola, soybean, or sunflower oil. Meal planning  Eat a balanced diet that includes: ? 5 or more servings of fruits and vegetables each day. At each meal, try to fill half of your plate with fruits and vegetables. ? Up to 6-8 servings of whole grains each day. ? Less than 6 oz of lean meat, poultry, or fish each day. A 3-oz serving of meat is about the same size as a deck of cards. One egg equals 1 oz. ? 2 servings of low-fat dairy each day. ? A serving of nuts, seeds, or beans 5 times each week. ? Heart-healthy fats. Healthy fats called Omega-3 fatty acids are found in foods such as flaxseeds and coldwater fish, like sardines, salmon, and mackerel.  Limit how much you eat of the following: ? Canned or prepackaged foods. ? Food that is high in trans fat, such as fried foods. ? Food that is high in saturated fat, such as fatty meat. ? Sweets, desserts, sugary drinks, and other foods with added sugar. ? Full-fat dairy products.  Do not salt foods before eating.  Try to eat at least 2 vegetarian meals each week.  Eat more home-cooked food and less restaurant, buffet, and fast food.  When eating at a restaurant, ask that your food be prepared with less salt or no salt, if possible. What foods are recommended? The items listed may not be a  complete list. Talk with your dietitian about what dietary choices are best for you. Grains Whole-grain or whole-wheat bread. Whole-grain or whole-wheat pasta. Brown rice. [place]. Bulgur. Whole-grain and low-sodium cereals. Pita bread. Low-fat, low-sodium crackers. Whole-wheat flour tortillas. Vegetables Fresh or frozen vegetables (raw, steamed, roasted, or grilled). Low-sodium or reduced-sodium tomato and vegetable juice. Low-sodium or reduced-sodium tomato sauce and tomato paste. Low-sodium or reduced-sodium canned vegetables. Fruits All fresh, dried, or frozen fruit. Canned  fruit in natural juice (without added sugar). Meat and other protein foods Skinless chicken or [place]. Ground chicken or [place]. Pork with fat trimmed off. Fish and seafood. Egg whites. Dried beans, peas, or lentils. Unsalted nuts, nut butters, and seeds. Unsalted canned beans. Lean cuts of beef with fat trimmed off. Low-sodium, lean deli meat. Dairy Low-fat (1%) or fat-free (skim) milk. Fat-free, low-fat, or reduced-fat cheeses. Nonfat, low-sodium ricotta or cottage cheese. Low-fat or nonfat yogurt. Low-fat, low-sodium cheese. Fats and oils Soft margarine without trans fats. Vegetable oil. Low-fat, reduced-fat, or light mayonnaise and salad dressings (reduced-sodium). Canola, safflower, olive, soybean, and sunflower oils. Avocado. Seasoning and other foods Herbs. Spices. Seasoning mixes without salt. Unsalted popcorn and pretzels. Fat-free sweets. What foods are not recommended? The items listed may not be a complete list. Talk with your dietitian about what dietary choices are best for you. Grains Baked goods made with fat, such as croissants, muffins, or some breads. Dry pasta or rice meal packs. Vegetables Creamed or fried vegetables. Vegetables in a cheese sauce. Regular canned vegetables (not low-sodium or reduced-sodium). Regular canned tomato sauce and paste (not low-sodium or reduced-sodium). Regular tomato and vegetable juice (not low-sodium or reduced-sodium). Angie Fava. Olives. Fruits Canned fruit in a light or heavy syrup. Fried fruit. Fruit in cream or butter sauce. Meat and other protein foods Fatty cuts of meat. Ribs. Fried meat. Berniece Salines. Sausage. Bologna and other processed lunch meats. Salami. Fatback. Hotdogs. Bratwurst. Salted nuts and seeds. Canned beans with added salt. Canned or smoked fish. Whole eggs or egg yolks. Chicken or [place] with skin. Dairy Whole or 2% milk, cream, and half-and-half. Whole or full-fat cream cheese. Whole-fat or sweetened yogurt. Full-fat cheese.  Nondairy creamers. Whipped toppings. Processed cheese and cheese spreads. Fats and oils Butter. Stick margarine. Lard. Shortening. Ghee. Bacon fat. Tropical oils, such as coconut, palm kernel, or palm oil. Seasoning and other foods Salted popcorn and pretzels. Onion salt, garlic salt, seasoned salt, table salt, and sea salt. Worcestershire sauce. Tartar sauce. Barbecue sauce. Teriyaki sauce. Soy sauce, including reduced-sodium. Steak sauce. Canned and packaged gravies. Fish sauce. Oyster sauce. Cocktail sauce. Horseradish that you find on the shelf. Ketchup. Mustard. Meat flavorings and tenderizers. Bouillon cubes. Hot sauce and Tabasco sauce. Premade or packaged marinades. Premade or packaged taco seasonings. Relishes. Regular salad dressings. Where to find more information:  National Heart, Lung, and [place]: https://wilson-eaton.com/  American Heart Association: www.heart.org Summary  The DASH eating plan is a healthy eating plan that has been shown to reduce high blood pressure (hypertension). It may also reduce your risk for type 2 diabetes, heart disease, and stroke.  With the DASH eating plan, you should limit salt (sodium) intake to 2,300 mg a day. If you have hypertension, you may need to reduce your sodium intake to 1,500 mg a day.  When on the DASH eating plan, aim to eat more fresh fruits and vegetables, whole grains, lean proteins, low-fat dairy, and heart-healthy fats.  Work with your health care provider or diet and nutrition specialist (dietitian)  to adjust your eating plan to your individual calorie needs. This information is not intended to replace advice given to you by your health care provider. Make sure you discuss any questions you have with your health care provider. Document Released: 03/04/2011 Document Revised: 03/08/2016 Document Reviewed: 03/08/2016 Elsevier Interactive Patient Education  2019 [employer].

## 2018-05-31 NOTE — Progress Notes (Signed)
Isaac Patel is a 53 y.o. male with the following history as recorded in EpicCare:  Patient Active Problem List   Diagnosis Date Noted  . Pre-diabetes 05/10/2018  . Deviated septum 11/30/2017  . Fatty liver 07/20/2017  . Abdominal bloating 07/20/2017  . GERD with esophagitis 06/08/2016  . Essential hypertension 06/08/2016  . 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 Anxiety state 11/15/2014  . CAD (coronary artery disease) 10/30/2014  . Tobacco abuse 10/30/2014  . HLD (hyperlipidemia) 10/30/2014  . History of non-ST elevation myocardial infarction (NSTEMI) 10/28/2014  . Encounter for general adult medical examination with abnormal findings 05/29/2014  . OSA (obstructive sleep apnea) 05/08/2014  . 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 aspirin EC 81 MG tablet Take 81 mg by mouth daily.    Marland Kitchen atorvastatin (LIPITOR) 80 MG tablet Take 1 tablet (80 mg total) by mouth daily at 6 PM. 90 tablet 3  . clopidogrel (PLAVIX) 75 MG tablet Take 1 tablet (75 mg total) by mouth daily. 90 tablet 3  . cyclobenzaprine (FLEXERIL) 5 MG tablet Take 1 tablet (5 mg total) by mouth 3 (three) times daily as needed for muscle spasms. 30 tablet 0  . diclofenac sodium (VOLTAREN) 1 % GEL Apply 2 g topically 4 (four) times daily. (Patient taking differently: Apply 2 g topically 4 (four) times daily as needed (pain). ) 100 g 0  . dicyclomine (BENTYL) 10 MG capsule Take 1 capsule (10 mg total) by mouth 4 (four) times daily as needed for spasms. For abdominal pain and bloating 120 capsule 5  . gabapentin (NEURONTIN) 100 MG capsule Take 2 capsules (200 mg total) by mouth at bedtime. 180 capsule 1  . lisinopril (PRINIVIL,ZESTRIL) 20 MG tablet Take 1 tablet (20 mg total) by mouth daily. Please keep upcoming appt in November for future refills. Thank you 30 tablet 1  . loratadine (CLARITIN) 10 MG tablet Take 10 mg by mouth daily as needed for  allergies.     . methylPREDNISolone (MEDROL) 4 MG TBPK tablet 6 tabs today, 5 tabs day 2, 4 tabs day 3, 3 tabs day 4, 2 tabs day 5, 1 tab day 6. Take w/food. 21 tablet 0  . Multiple Vitamin (MULTI-VITAMIN PO) Take 1 tablet by mouth daily.    . nitroGLYCERIN (NITROSTAT) 0.4 MG SL tablet Place 1 tablet (0.4 mg total) under the tongue every 5 (five) minutes x 3 doses as needed for chest pain. 25 tablet 12  . nystatin ointment (MYCOSTATIN) Apply 1 application topically daily as needed (arm rash).    . pantoprazole (PROTONIX) 40 MG tablet TAKE 1 TABLET BY MOUTH DAILY. 90 tablet 0  . tamsulosin (FLOMAX) 0.4 MG CAPS capsule Take 1 capsule (0.4 mg total) by mouth daily. 30 capsule 3   No current facility-administered medications for this visit.     Allergies: Morphine and related  Past Medical History:  Diagnosis Date  . CAD (coronary artery disease)    a.  Non-STEMI 8/16: LHC-proximal LAD 80% treated with a resolute DES, EF 50-55%;  b.  Echo 8/16:  Moderate LVH, EF 55-60%, normal wall motion, normal diastolic function, normal RV function  . Depression    hx  . GERD (gastroesophageal reflux disease)   . Headache    "maybe monthly" (10/28/2014)  . HLD (hyperlipidemia)   . Hypertension   . NSTEMI (non-ST elevated myocardial infarction) ([place]) 10/28/2014  . OSA (obstructive sleep apnea)    "  tried mask; wear it off and on" (10/28/2014)    Past Surgical History:  Procedure Laterality Date  . 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 CARDIAC CATHETERIZATION N/A 10/29/2014   Procedure: Left Heart Cath and Coronary Angiography;  Surgeon: Troy Sine, MD;  Location: [facility];  Service: Cardiovascular;  Laterality: N/A;  . CHOLESTEATOMA EXCISION Right 1990's  . LEFT HEART CATH AND CORONARY ANGIOGRAPHY N/A 05/25/2016   Procedure: Left Heart Cath and Coronary Angiography;  Surgeon: Sherren Mocha, MD;  Location: [facility];  Service: Cardiovascular;  Laterality: N/A;  .  NASAL SEPTOPLASTY W/ TURBINOPLASTY Bilateral 11/30/2017   Procedure: NASAL SEPTOPLASTY WITH TURBINATE REDUCTION;  Surgeon: Jerrell Belfast, MD;  Location: [facility];  Service: ENT;  Laterality: Bilateral;  . TYMPANOSTOMY TUBE PLACEMENT Bilateral "as a kid"   "had one done as an adult too"  . WISDOM TOOTH EXTRACTION      Family History  Problem Relation Age of Onset  . Diverticulitis Mother   . Skin cancer Mother   . Healthy Father   . Bradycardia Father   . Multiple sclerosis Maternal Grandfather   . Colon cancer Neg Hx   . Stomach cancer Neg Hx   . Rectal cancer Neg Hx   . Liver cancer Neg Hx   . Esophageal cancer Neg Hx     Social History   Tobacco Use  . Smoking status: Current Some Day Smoker    Packs/day: 0.33    Years: 32.00    Pack years: 10.56    Types: Cigarettes  . Smokeless tobacco: Never Used  Substance Use Topics  . Alcohol use: No    Alcohol/week: 2.0 - 3.0 standard drinks    Types: 2 - 3 Standard drinks or equivalent per week    Frequency: Never    Comment: stopped 06/19/2015,      Subjective:  Isaac Patel is here today for follow up of hypertension, at  His last OV on 05/10/18, his lisinopril was increased to 20 daily due to elevated BP readings, hes back today for follow up. He has increased lisinopril to 20 daily as instructed, has not really been checking BP readings at home, feels well on dosage increase without noted adverse effects. He is also requesting a refill of neurontin today, started back in December 2019 for neck pain, has been taking daily as prescribed with good improvement in his pain and helps him sleep better, has not noted any adverse medication effects, would like to continue, did not schedule follow up with sports medicine yet. No fevers, chills, weakness, syncope, confusion, cp, edema.  BP Readings from Last 3 Encounters:  05/31/18 122/62  05/10/18 (!) 144/60  03/08/18 140/80   ROS- See HPI  Objective:  Vitals:   05/31/18 1533  BP:  122/62  Pulse: 94  SpO2: 98%  Weight: 206 lb (93.4 kg)  Height: 5' 11.25" (1.81 m)    General: Well developed, well nourished, in no acute distress  Skin : Warm and dry.  Head: Normocephalic and atraumatic  Eyes: Sclera and conjunctiva clear; pupils round and reactive to light; extraocular movements intact  Oropharynx: Pink, supple. No suspicious lesions  Neck: Supple without thyromegaly, adenopathy  Lungs: Respirations unlabored; clear to auscultation bilaterally without wheeze, rales, rhonchi  CVS exam: normal rate and regular rhythm, S1 and S2 normal.  Extremities: No edema, cyanosis, clubbing. 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  Vessels: Symmetric bilaterally  Neurologic: Alert  and oriented; speech intact; face symmetrical; moves all extremities well; CNII-XII intact without focal deficit  Psychiatric: Normal mood and affect.  Assessment:  1. Essential hypertension   2. Neck pain     Plan:   Return in about 3 months (around 08/31/2018) for CPE.  Orders Placed This Encounter  Procedures  . Basic metabolic panel    Standing Status:   Future    Standing Expiration Date:   05/31/2019    Requested Prescriptions   Signed Prescriptions Disp Refills  . gabapentin (NEURONTIN) 100 MG capsule 180 capsule 1    Sig: Take 2 capsules (200 mg total) by mouth at bedtime.

## 2018-05-31 NOTE — Assessment & Plan Note (Signed)
Improved with gabapentin, Continue current dosage - gabapentin (NEURONTIN) 100 MG capsule; Take 2 capsules (200 mg total) by mouth at bedtime.  Dispense: 180 capsule; Refill: 1

## 2018-05-31 NOTE — Assessment & Plan Note (Signed)
Stable Continue current dosage of lisinopril RTC in 3 months for CPE, BP recheck Additional education, DASH diet on AVS - Basic metabolic panel; Future

## 2018-06-06 ENCOUNTER — Encounter (HOSPITAL_COMMUNITY): Payer: Self-pay | Admitting: Emergency Medicine

## 2018-06-06 ENCOUNTER — Other Ambulatory Visit: Payer: Self-pay

## 2018-06-06 ENCOUNTER — Observation Stay (HOSPITAL_COMMUNITY)
Admission: EM | Admit: 2018-06-06 | Discharge: 2018-06-08 | Disposition: A | Discharge status: Home / Self Care | Payer: 59 | Attending: Internal Medicine | Admitting: Internal Medicine

## 2018-06-06 ENCOUNTER — Emergency Department (HOSPITAL_COMMUNITY): Payer: 59

## 2018-06-06 DIAGNOSIS — E785 Hyperlipidemia, unspecified: Secondary | ICD-10-CM | POA: Diagnosis present

## 2018-06-06 DIAGNOSIS — I351 Nonrheumatic aortic (valve) insufficiency: Secondary | ICD-10-CM | POA: Insufficient documentation

## 2018-06-06 DIAGNOSIS — I10 Essential (primary) hypertension: Secondary | ICD-10-CM | POA: Diagnosis present

## 2018-06-06 DIAGNOSIS — R42 Dizziness and giddiness: Secondary | ICD-10-CM | POA: Insufficient documentation

## 2018-06-06 DIAGNOSIS — I252 Old myocardial infarction: Secondary | ICD-10-CM | POA: Diagnosis not present

## 2018-06-06 DIAGNOSIS — I2510 Atherosclerotic heart disease of native coronary artery without angina pectoris: Secondary | ICD-10-CM | POA: Diagnosis not present

## 2018-06-06 DIAGNOSIS — R0789 Other chest pain: Principal | ICD-10-CM | POA: Insufficient documentation

## 2018-06-06 DIAGNOSIS — Z79899 Other long term (current) drug therapy: Secondary | ICD-10-CM | POA: Diagnosis not present

## 2018-06-06 DIAGNOSIS — F411 Generalized anxiety disorder: Secondary | ICD-10-CM | POA: Diagnosis present

## 2018-06-06 DIAGNOSIS — Z7982 Long term (current) use of aspirin: Secondary | ICD-10-CM | POA: Insufficient documentation

## 2018-06-06 DIAGNOSIS — S58119A Complete traumatic amputation at level between elbow and wrist, unspecified arm, initial encounter: Secondary | ICD-10-CM

## 2018-06-06 DIAGNOSIS — Z716 Tobacco abuse counseling: Secondary | ICD-10-CM | POA: Diagnosis not present

## 2018-06-06 DIAGNOSIS — R079 Chest pain, unspecified: Secondary | ICD-10-CM | POA: Diagnosis present

## 2018-06-06 DIAGNOSIS — F419 Anxiety disorder, unspecified: Secondary | ICD-10-CM | POA: Diagnosis not present

## 2018-06-06 DIAGNOSIS — Z720 Tobacco use: Secondary | ICD-10-CM | POA: Diagnosis present

## 2018-06-06 DIAGNOSIS — Z885 Allergy status to narcotic agent status: Secondary | ICD-10-CM | POA: Diagnosis not present

## 2018-06-06 DIAGNOSIS — Z8489 Family history of other specified conditions: Secondary | ICD-10-CM | POA: Diagnosis not present

## 2018-06-06 DIAGNOSIS — Z791 Long term (current) use of non-steroidal anti-inflammatories (NSAID): Secondary | ICD-10-CM | POA: Diagnosis not present

## 2018-06-06 DIAGNOSIS — K2100 Gastro-esophageal reflux disease with esophagitis, without bleeding: Secondary | ICD-10-CM | POA: Diagnosis present

## 2018-06-06 DIAGNOSIS — Z89212 Acquired absence of left upper limb below elbow: Secondary | ICD-10-CM | POA: Insufficient documentation

## 2018-06-06 DIAGNOSIS — G4733 Obstructive sleep apnea (adult) (pediatric): Secondary | ICD-10-CM | POA: Diagnosis not present

## 2018-06-06 DIAGNOSIS — S58112S Complete traumatic amputation at level between elbow and wrist, left arm, sequela: Secondary | ICD-10-CM | POA: Diagnosis not present

## 2018-06-06 DIAGNOSIS — F17210 Nicotine dependence, cigarettes, uncomplicated: Secondary | ICD-10-CM | POA: Insufficient documentation

## 2018-06-06 DIAGNOSIS — Z7902 Long term (current) use of antithrombotics/antiplatelets: Secondary | ICD-10-CM | POA: Insufficient documentation

## 2018-06-06 DIAGNOSIS — Z955 Presence of coronary angioplasty implant and graft: Secondary | ICD-10-CM | POA: Insufficient documentation

## 2018-06-06 DIAGNOSIS — R011 Cardiac murmur, unspecified: Secondary | ICD-10-CM

## 2018-06-06 LAB — BASIC METABOLIC PANEL
Anion gap: 7 (ref 5–15)
BUN: 21 mg/dL — ABNORMAL HIGH (ref 6–20)
CO2: 23 mmol/L (ref 22–32)
Calcium: 8.7 mg/dL — ABNORMAL LOW (ref 8.9–10.3)
Chloride: 107 mmol/L (ref 98–111)
Creatinine, Ser: 1.1 mg/dL (ref 0.61–1.24)
GFR calc Af Amer: >60 mL/min (ref >60)
GFR calc non Af Amer: >60 mL/min (ref >60)
GLUCOSE: 128 mg/dL — AB (ref 70–99)
Potassium: 4 mmol/L (ref 3.5–5.1)
Sodium: 137 mmol/L (ref 135–145)

## 2018-06-06 LAB — CBC
HCT: 43.3 % (ref 39.0–52.0)
Hemoglobin: 14.2 g/dL (ref 13.0–17.0)
MCH: 30.9 pg (ref 26.0–34.0)
MCHC: 32.8 g/dL (ref 30.0–36.0)
MCV: 94.3 fL (ref 80.0–100.0)
Platelets: 218 10*3/uL (ref 150–400)
RBC: 4.59 MIL/uL (ref 4.22–5.81)
RDW: 12.3 % (ref 11.5–15.5)
WBC: 8 10*3/uL (ref 4.0–10.5)
nRBC: 0 % (ref 0.0–0.2)

## 2018-06-06 LAB — TROPONIN I: Troponin I: <0.03 ng/mL (ref <0.03)

## 2018-06-06 LAB — I-STAT TROPONIN, ED
Troponin i, poc: 0 ng/mL (ref 0.00–0.08)
Troponin i, poc: 0.01 ng/mL (ref 0.00–0.08)

## 2018-06-06 MED ORDER — HEPARIN (PORCINE) 25000 UT/250ML-% IV SOLN
1100.0000 [IU]/h | INTRAVENOUS | Status: DC
  Administered 2018-06-06: 1100 [IU]/h via INTRAVENOUS
  Filled 2018-06-06: qty 250

## 2018-06-06 MED ORDER — HEPARIN BOLUS VIA INFUSION
4000.0000 [IU] | Freq: Once | INTRAVENOUS | Status: AC
  Administered 2018-06-06: 4000 [IU] via INTRAVENOUS
  Filled 2018-06-06: qty 4000

## 2018-06-06 MED ORDER — SODIUM CHLORIDE 0.9% FLUSH: 3.0000 mL | Freq: Once | INTRAVENOUS | Status: DC

## 2018-06-06 MED ORDER — ONDANSETRON HCL 4 MG/2ML IJ SOLN: 4.0000 mg | Freq: Four times a day (QID) | INTRAMUSCULAR | Status: DC | PRN

## 2018-06-06 MED ORDER — SODIUM CHLORIDE 0.9 % IV SOLN
INTRAVENOUS | Status: DC
  Administered 2018-06-06: 23:00:00 via INTRAVENOUS

## 2018-06-06 MED ORDER — ACETAMINOPHEN 325 MG PO TABS
650.0000 mg | ORAL_TABLET | ORAL | Status: DC | PRN
  Administered 2018-06-07: 650 mg via ORAL
  Filled 2018-06-06: qty 2

## 2018-06-06 NOTE — ED Notes (Signed)
ED TO INPATIENT HANDOFF REPORT  ED Nurse Name and Phone #: Sherrine Maples [phone]  S Name/Age/Gender Isaac Patel 53 y.o. male Room/Bed: 039C/039C  Code Status   Code Status: Full Code  Home/SNF/Other Home Patient oriented to: self, place, time and situation Is this baseline? Yes   Triage Complete: Triage complete  Chief Complaint Chest Tightness; Discomfort  Triage Note Pt. Stated, I started having some deep chest pain today with some dizziness.   Pt. Stated, he took 3 NTG  And no better.   Allergies Allergies  Allergen Reactions  . Morphine And Related Itching    Level of Care/Admitting Diagnosis ED Disposition    ED Disposition Condition [facility]: [place] [[id number]]  Level of Care: Cardiac Telemetry [103]  I expect the patient will be discharged within 24 hours: Yes  LOW acuity---Tx typically complete <24 hrs---ACUTE conditions typically can be evaluated <24 hours---LABS likely to return to acceptable levels <24 hours---IS near functional baseline---EXPECTED to return to current living arrangement---NOT newly hypoxic: Meets criteria for 5C-Observation unit  Diagnosis: Chest pain [[id number]]  Admitting Physician: Elwyn Reach [[id number]]  Attending Physician: Elwyn Reach [[id number]]  PT Class (Do Not Modify): Observation [104]  PT Acc Code (Do Not Modify): Observation [10022]       B Medical/Surgery History Past Medical History:  Diagnosis Date  . CAD (coronary artery disease)    a.  Non-STEMI 8/16: LHC-proximal LAD 80% treated with a resolute DES, EF 50-55%;  b.  Echo 8/16:  Moderate LVH, EF 55-60%, normal wall motion, normal diastolic function, normal RV function  . Depression    hx  . GERD (gastroesophageal reflux disease)   . Headache    "maybe monthly" (10/28/2014)  . HLD (hyperlipidemia)   . Hypertension   . NSTEMI (non-ST elevated myocardial infarction) ([place]) 10/28/2014  . OSA (obstructive sleep apnea)    "tried  mask; wear it off and on" (10/28/2014)   Past Surgical History:  Procedure Laterality Date  . 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 CARDIAC CATHETERIZATION N/A 10/29/2014   Procedure: Left Heart Cath and Coronary Angiography;  Surgeon: Troy Sine, MD;  Location: [facility];  Service: Cardiovascular;  Laterality: N/A;  . CHOLESTEATOMA EXCISION Right 1990's  . LEFT HEART CATH AND CORONARY ANGIOGRAPHY N/A 05/25/2016   Procedure: Left Heart Cath and Coronary Angiography;  Surgeon: Sherren Mocha, MD;  Location: [facility];  Service: Cardiovascular;  Laterality: N/A;  . NASAL SEPTOPLASTY W/ TURBINOPLASTY Bilateral 11/30/2017   Procedure: NASAL SEPTOPLASTY WITH TURBINATE REDUCTION;  Surgeon: Jerrell Belfast, MD;  Location: [facility];  Service: ENT;  Laterality: Bilateral;  . TYMPANOSTOMY TUBE PLACEMENT Bilateral "as a kid"   "had one done as an adult too"  . WISDOM TOOTH EXTRACTION       A IV Location/Drains/Wounds Patient Lines/Drains/Airways Status   Active Line/Drains/Airways    Name:   Placement date:   Placement time:   Site:   Days:   Incision (Closed) 11/30/17 Nose Other (Comment)   11/30/17    0935     188          Intake/Output Last 24 hours No intake or output data in the 24 hours ending 06/06/18 2127  Labs/Imaging Results for orders placed or performed during the hospital encounter of 06/06/18 (from the past 48 hour(s))  Basic metabolic panel     Status: Abnormal   Collection Time: 06/06/18  4:45 PM  Result Value Ref Range   Sodium 137 135 - 145 mmol/L   Potassium 4.0 3.5 - 5.1 mmol/L   Chloride 107 98 - 111 mmol/L   CO2 23 22 - 32 mmol/L   Glucose, Bld 128 (H) 70 - 99 mg/dL   BUN 21 (H) 6 - 20 mg/dL   Creatinine, Ser 1.10 0.61 - 1.24 mg/dL   Calcium 8.7 (L) 8.9 - 10.3 mg/dL   GFR calc non Af Amer >60 >60 mL/min   GFR calc Af Amer >60 >60 mL/min   Anion gap 7 5 - 15    Comment: Performed at [facility] [address] [place], [place] [address]  CBC     Status: None   Collection Time: 06/06/18  4:45 PM  Result Value Ref Range   WBC 8.0 4.0 - 10.5 K/uL   RBC 4.59 4.22 - 5.81 MIL/uL   Hemoglobin 14.2 13.0 - 17.0 g/dL   HCT 43.3 39.0 - 52.0 %   MCV 94.3 80.0 - 100.0 fL   MCH 30.9 26.0 - 34.0 pg   MCHC 32.8 30.0 - 36.0 g/dL   RDW 12.3 11.5 - 15.5 %   Platelets 218 150 - 400 K/uL   nRBC 0.0 0.0 - 0.2 %    Comment: Performed at [facility], [place] [address], [place], [place] [address]  I-stat troponin, ED     Status: None   Collection Time: 06/06/18  4:54 PM  Result Value Ref Range   Troponin i, poc 0.00 0.00 - 0.08 ng/mL   Comment 3            Comment: Due to the release kinetics of cTnI, a negative result within the first hours of the onset of symptoms does not rule out myocardial infarction with certainty. If myocardial infarction is still suspected, repeat the test at appropriate intervals.   I-Stat Troponin, ED (not at [facility])     Status: None   Collection Time: 06/06/18  8:44 PM  Result Value Ref Range   Troponin i, poc 0.01 0.00 - 0.08 ng/mL   Comment 3            Comment: Due to the release kinetics of cTnI, a negative result within the first hours of the onset of symptoms does not rule out myocardial infarction with certainty. If myocardial infarction is still suspected, repeat the test at appropriate intervals.    Dg Chest 2 View  Result Date: 06/06/2018 CLINICAL DATA:  Mid chest pain. EXAM: CHEST - 2 VIEW COMPARISON:  None. FINDINGS: The heart size and mediastinal contours are within normal limits. Both lungs are clear. The visualized skeletal structures are unremarkable. IMPRESSION: No active cardiopulmonary disease. Electronically Signed   By: Dorise Bullion III M.D   On: 06/06/2018 17:26    Pending Labs Unresulted Labs (From admission, onward)    Start     Ordered   06/07/18 0500  Heparin level (unfractionated)  Daily,   R     06/06/18 2100   06/07/18 0500  CBC  Daily,    R     06/06/18 2100   06/06/18 2054  Troponin I - Now Then Q3H  Now then every 3 hours,   TIMED     06/06/18 2054   06/06/18 2053  HIV antibody (Routine Testing)  Once,   R     06/06/18 2054          Vitals/Pain Today's Vitals  06/06/18 1635 06/06/18 2015 06/06/18 2030 06/06/18 2045  BP: 116/60 123/60 110/72 115/65  Pulse: 74 68 (!) 52 64  Resp: 17 13 17 15   Temp: 97.6 F (36.4 C)     TempSrc: Oral     SpO2: 97% 96% 97% 97%  Weight:      Height:      PainSc:        Isolation Precautions No active isolations  Medications Medications  sodium chloride flush (NS) 0.9 % injection 3 mL (has no administration in time range)  acetaminophen (TYLENOL) tablet 650 mg (has no administration in time range)  ondansetron (ZOFRAN) injection 4 mg (has no administration in time range)  0.9 %  sodium chloride infusion (has no administration in time range)  heparin bolus via infusion 4,000 Units (has no administration in time range)  heparin ADULT infusion 100 units/mL (25000 units/298mL sodium chloride 0.45%) (has no administration in time range)    Mobility walks Low fall risk   Focused Assessments Cardiac Assessment Handoff:    Lab Results  Component Value Date   TROPONINI <0.03 05/24/2016   No results found for: DDIMER Does the Patient currently have chest pain? No     R Recommendations: See Admitting Provider Note  Report given to:   Additional Notes:

## 2018-06-06 NOTE — ED Provider Notes (Signed)
[facility] EMERGENCY DEPARTMENT Provider Note   CSN: [id number] Arrival date & time: 06/06/18  1624    History   Chief Complaint Chief Complaint  Patient presents with  . Chest Pain  . Dizziness    HPI Isaac Patel is a 53 y.o. male with a PMH of CAD, GERD, HLD, HTN, and NSTEMI with a stent in 2016 presenting with constant central chest pain onset today at 1pm. Wife is a contributing historian. Patient describes chest pain as a burning and tightness sensation. Patient reports he tried 3 nitroglycerin and aspirin without relief. Patient states nothing makes chest pain better. Patient states movement and pressing on chest make pain worse. Patient denies injury. Patient reports intermittent shortness of breath today. Patient reports nausea, but denies vomiting or abdominal pain. Patient states cardiologist is Dr. Radford Pax and was last seen in 01/2018. Patient reports tobacco use and occasional alcohol use, but denies drug use. Patient denies recent surgery or leg edema/pain. Patient reports a productive cough and congestion for 2 weeks. Patient reports sick exposures by family. Patient denies recent travel or fever. Last cath was done in 04/2016 and reveals minor non obstructive CAD, patent proximal LAD stent, and LVEF of 55%.      HPI  Past Medical History:  Diagnosis Date  . CAD (coronary artery disease)    a.  Non-STEMI 8/16: LHC-proximal LAD 80% treated with a resolute DES, EF 50-55%;  b.  Echo 8/16:  Moderate LVH, EF 55-60%, normal wall motion, normal diastolic function, normal RV function  . Depression    hx  . GERD (gastroesophageal reflux disease)   . Headache    "maybe monthly" (10/28/2014)  . HLD (hyperlipidemia)   . Hypertension   . NSTEMI (non-ST elevated myocardial infarction) ([place]) 10/28/2014  . OSA (obstructive sleep apnea)    "tried mask; wear it off and on" (10/28/2014)    Patient Active Problem List   Diagnosis Date Noted  . Neck pain 05/31/2018    . Pre-diabetes 05/10/2018  . Deviated septum 11/30/2017  . Fatty liver 07/20/2017  . Abdominal bloating 07/20/2017  . GERD with esophagitis 06/08/2016  . Essential hypertension 06/08/2016  . 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 Anxiety state 11/15/2014  . CAD (coronary artery disease) 10/30/2014  . Tobacco abuse 10/30/2014  . HLD (hyperlipidemia) 10/30/2014  . History of non-ST elevation myocardial infarction (NSTEMI) 10/28/2014  . Encounter for general adult medical examination with abnormal findings 05/29/2014  . OSA (obstructive sleep apnea) 05/08/2014  . 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 CARDIAC CATHETERIZATION N/A 10/29/2014   Procedure: Left Heart Cath and Coronary Angiography;  Surgeon: Troy Sine, MD;  Location: [facility];  Service: Cardiovascular;  Laterality: N/A;  . CHOLESTEATOMA EXCISION Right 1990's  . LEFT HEART CATH AND CORONARY ANGIOGRAPHY N/A 05/25/2016   Procedure: Left Heart Cath and Coronary Angiography;  Surgeon: Sherren Mocha, MD;  Location: [facility];  Service: Cardiovascular;  Laterality: N/A;  . NASAL SEPTOPLASTY W/ TURBINOPLASTY Bilateral 11/30/2017   Procedure: NASAL SEPTOPLASTY WITH TURBINATE REDUCTION;  Surgeon: Jerrell Belfast, MD;  Location: [facility];  Service: ENT;  Laterality: Bilateral;  . TYMPANOSTOMY TUBE PLACEMENT Bilateral "as a kid"   "had one done as an adult too"  . [place] EXTRACTION          Home Medications  Prior to Admission medications   Medication Sig Start Date End Date Taking? Authorizing Provider  aspirin EC 81 MG tablet Take 81 mg by mouth daily.   Yes [provider]  atorvastatin (LIPITOR) 80 MG tablet Take 1 tablet (80 mg total) by mouth daily at 6 PM. 01/31/18  Yes Weaver, Nicki Reaper T, PA-C  clopidogrel (PLAVIX) 75 MG tablet Take 1 tablet (75 mg total) by mouth  daily. 01/31/18 01/31/19 Yes Weaver, Scott T, PA-C  cyclobenzaprine (FLEXERIL) 5 MG tablet Take 1 tablet (5 mg total) by mouth 3 (three) times daily as needed for muscle spasms. 03/08/18  Yes [name], NP  diclofenac sodium (VOLTAREN) 1 % GEL Apply 2 g topically 4 (four) times daily. Patient taking differently: Apply 2 g topically 4 (four) times daily as needed (pain).  08/12/17  Yes [name], NP  dicyclomine (BENTYL) 10 MG capsule Take 1 capsule (10 mg total) by mouth 4 (four) times daily as needed for spasms. For abdominal pain and bloating 08/26/16  Yes Ladene Artist, MD  gabapentin (NEURONTIN) 100 MG capsule Take 2 capsules (200 mg total) by mouth at bedtime. 05/31/18  Yes [name], NP  lisinopril (PRINIVIL,ZESTRIL) 20 MG tablet Take 1 tablet (20 mg total) by mouth daily. Please keep upcoming appt in November for future refills. Thank you Patient taking differently: Take 20 mg by mouth daily.  05/10/18  Yes [name], NP  loratadine (CLARITIN) 10 MG tablet Take 10 mg by mouth daily as needed for allergies.    Yes [provider]  Multiple Vitamin (MULTI-VITAMIN PO) Take 1 tablet by mouth daily.   Yes [provider]  nitroGLYCERIN (NITROSTAT) 0.4 MG SL tablet Place 1 tablet (0.4 mg total) under the tongue every 5 (five) minutes x 3 doses as needed for chest pain. 10/30/14  Yes Brett Canales, PA-C  pantoprazole (PROTONIX) 40 MG tablet TAKE 1 TABLET BY MOUTH DAILY. Patient taking differently: Take 40 mg by mouth daily.  05/08/18  Yes [name], NP  tamsulosin (FLOMAX) 0.4 MG CAPS capsule Take 1 capsule (0.4 mg total) by mouth daily. Patient not taking: Reported on 06/06/2018 10/10/17   [name], NP    Family History Family History  Problem Relation Age of Onset  . Diverticulitis Mother   . Skin cancer Mother   . Healthy Father   . Bradycardia Father   . Multiple sclerosis Maternal Grandfather   . Colon cancer  Neg Hx   . Stomach cancer Neg Hx   . Rectal cancer Neg Hx   . Liver cancer Neg Hx   . Esophageal cancer Neg Hx     Social History Social History   Tobacco Use  . Smoking status: Current Some Day Smoker    Packs/day: 0.33    Years: 32.00    Pack years: 10.56    Types: Cigarettes  . Smokeless tobacco: Never Used  Substance Use Topics  . Alcohol use: No    Alcohol/week: 2.0 - 3.0 standard drinks    Types: 2 - 3 Standard drinks or equivalent per week    Frequency: Never    Comment: stopped 06/19/2015,   . Drug use: Yes    Types: Marijuana    Comment: 05/24/2016  "stopped marijuana in the early 2000's"     Allergies   Morphine and related   Review of Systems Review of Systems  Constitutional: Negative for activity change, appetite change, chills, diaphoresis, fatigue, fever and  unexpected weight change.  HENT: Positive for congestion and sinus pressure. Negative for rhinorrhea and sore throat.   Eyes: Negative for visual disturbance.  Respiratory: Positive for cough and shortness of breath. Negative for chest tightness and wheezing.   Cardiovascular: Positive for chest pain. Negative for palpitations and leg swelling.  Gastrointestinal: Positive for nausea. Negative for abdominal pain, diarrhea and vomiting.  Endocrine: Negative for cold intolerance and heat intolerance.  Genitourinary: Negative for dysuria.  Musculoskeletal: Negative for back pain.  Skin: Negative for rash.  Allergic/Immunologic: Negative for immunocompromised state.  Neurological: Positive for dizziness. Negative for syncope, weakness and light-headedness.  Psychiatric/Behavioral: Negative for agitation and behavioral problems. The patient is not nervous/anxious.      Physical Exam Updated Vital Signs BP 110/72   Pulse (!) 52   Temp 97.6 F (36.4 C) (Oral)   Resp 17   Ht 5\' 11"  (1.803 m)   Wt 93.4 kg   SpO2 97%   BMI 28.72 kg/m   Physical Exam Vitals signs and nursing note reviewed.    Constitutional:      General: He is not in acute distress.    Appearance: He is well-developed. He is not diaphoretic.  HENT:     Head: Normocephalic and atraumatic.     Right Ear: Hearing, tympanic membrane, ear canal and external ear normal.     Left Ear: Hearing, tympanic membrane, ear canal and external ear normal.     Nose: Nose normal. No congestion or rhinorrhea.     Right Sinus: No maxillary sinus tenderness or frontal sinus tenderness.     Left Sinus: No maxillary sinus tenderness or frontal sinus tenderness.     Mouth/Throat:     Mouth: Mucous membranes are moist.     Pharynx: Oropharynx is clear. Uvula midline. No oropharyngeal exudate.  Neck:     Musculoskeletal: Normal range of motion and neck supple.     Vascular: No JVD.  Cardiovascular:     Rate and Rhythm: Normal rate and regular rhythm.     Pulses: Normal pulses.          Radial pulses are 2+ on the right side.       Dorsalis pedis pulses are 2+ on the right side.     Heart sounds: Normal heart sounds. No murmur. No friction rub. No gallop.   Pulmonary:     Effort: Pulmonary effort is normal. No respiratory distress.     Breath sounds: Normal breath sounds. No wheezing or rales.  Chest:     Chest wall: Tenderness present.  Abdominal:     Palpations: Abdomen is soft.     Tenderness: There is no abdominal tenderness.  Musculoskeletal: Normal range of motion.     Right lower leg: He exhibits no tenderness. No edema.     Left lower leg: He exhibits no tenderness. No edema.     Comments: Patient has prosthetic arm on left arm.  Skin:    General: Skin is warm.     Capillary Refill: Capillary refill takes less than 2 seconds.     Coloration: Skin is not pale.     Findings: No rash.  Neurological:     Mental Status: He is alert and oriented to person, place, and time.    Mental Status:  Alert, oriented, thought content appropriate, able to give a coherent history. Speech fluent without evidence of aphasia. Able  to follow 2 step commands without difficulty.  Cranial Nerves:  II:  Peripheral visual  fields grossly normal, pupils equal, round, reactive to light III,IV, VI: ptosis not present, extra-ocular motions intact bilaterally  V,VII: smile symmetric, facial light touch sensation equal VIII: hearing grossly normal to voice  X: uvula elevates symmetrically  XI: bilateral shoulder shrug symmetric and strong XII: midline tongue extension without fassiculations Motor:  Normal tone. 5/5 in right upper and lower extremities bilaterally including strong and equal grip strength and dorsiflexion/plantar flexion Sensory: light touch normal in all extremities.  Deep Tendon Reflexes: 2+ and symmetric in the right bicep and bilateral patella Cerebellar: normal finger-to-nose with right upper extremity Gait: normal gait and balance.  CV: distal pulses palpable throughout   ED Treatments / Results  Labs (all labs ordered are listed, but only abnormal results are displayed) Labs Reviewed  BASIC METABOLIC PANEL - Abnormal; Notable for the following components:      Result Value   Glucose, Bld 128 (*)    BUN 21 (*)    Calcium 8.7 (*)    All other components within normal limits  CBC  I-STAT TROPONIN, ED  I-STAT TROPONIN, ED    EKG EKG Interpretation  Date/Time:  Tuesday June 06 2018 16:34:33 EDT Ventricular Rate:  76 PR Interval:  126 QRS Duration: 86 QT Interval:  392 QTC Calculation: 441 R Axis:   44 Text Interpretation:  Sinus rhythm with Premature atrial complexes Otherwise normal ECG Confirmed by Lennice Sites [phone]) on 06/06/2018 6:52:54 PM   Radiology Dg Chest 2 View  Result Date: 06/06/2018 CLINICAL DATA:  Mid chest pain. EXAM: CHEST - 2 VIEW COMPARISON:  None. FINDINGS: The heart size and mediastinal contours are within normal limits. Both lungs are clear. The visualized skeletal structures are unremarkable. IMPRESSION: No active cardiopulmonary disease. Electronically Signed   By:  Dorise Bullion III M.D   On: 06/06/2018 17:26    Procedures Procedures (including critical care time)  Medications Ordered in ED Medications  sodium chloride flush (NS) 0.9 % injection 3 mL (has no administration in time range)     Initial Impression / Assessment and Plan / ED Course  I have reviewed the triage vital signs and the nursing notes.  Pertinent labs & imaging results that were available during my care of the patient were reviewed by me and considered in my medical decision making (see chart for details).  Clinical Course as of Jun 06 2046  Tue Jun 06, 2018  1841 No active cardiopulmonary disease.  DG Chest 2 View [AH]  4128 CBC is unremarkable.  CBC [AH]    Clinical Course User Index [AH] Arville Lime, PA-C      Patient presents with chest pain. Heart score is a 4. CXR does not reveal active cardiopulmonary disease. EKG without acute changes. First troponin negative. Concern for cardiac etiology of Chest Pain. Consulted cardiology. Will admit for chest pain rule out. Patient will need serial troponin and EKGs. Consulted hospitalist. Hospitalist has agreed to admit patient. Case has been discussed with Dr. Ronnald Nian.  Final Clinical Impressions(s) / ED Diagnoses   Final diagnoses:  Dizziness  Atypical chest pain    ED Discharge Orders    None       Arville Lime, PA-C 06/06/18 2058    Lennice Sites, DO 06/06/18 2223

## 2018-06-06 NOTE — H&P (Deleted)
History and Physical   Isaac Patel WJX:[id number] DOB: Jan 03, 1966 DOA: 06/06/2018  Referring MD/NP/PA: Dr. Ronnald Nian  PCP: [name], NP   Outpatient Specialists: Dr. Radford Pax  Patient coming from: Home  Chief Complaint: Chest pain and dizziness  HPI: Isaac Patel is a 53 y.o. male with medical history significant of hypertension, hyperlipidemia, GERD, coronary artery disease, history of NSTEMI in 2009.  49 and 2016 had stents.  Patient following up with cardiology was last visit in November 2019.  Patient presents to the ER with substernal chest pain on and off for the last 2 days.  He initially was having bloating and abdominal discomfort.  Symptoms have progressed to the left side of the chest.  More pressure.  Consistent with his previous cardiac pain.  Not relieved by his nitro.  Came to the ER where he has had some relief but still has 2 out of 10 pain in his chest.  Associated with some mild nausea.  No diaphoresis.  Patient's initial enzyme was negative.  EKG also showed no new findings.  Due to risk factors he is being admitted for MI rule out.  Cardiology has already been contacted..  ED Course: Vitals are stable, CBC and chemistry all stable.  Initial troponin 0 0.  Chest x-ray showed no active findings.  EKG also showed no significant findings.  Patient is being admitted for MI rule out.  Cardiology consulted and the recommended of the patient and trending enzymes.  Review of Systems: As per HPI otherwise 10 point review of systems negative.    Past Medical History:  Diagnosis Date  . CAD (coronary artery disease)    a.  Non-STEMI 8/16: LHC-proximal LAD 80% treated with a resolute DES, EF 50-55%;  b.  Echo 8/16:  Moderate LVH, EF 55-60%, normal wall motion, normal diastolic function, normal RV function  . Depression    hx  . GERD (gastroesophageal reflux disease)   . Headache    "maybe monthly" (10/28/2014)  . HLD (hyperlipidemia)   . Hypertension   . NSTEMI  (non-ST elevated myocardial infarction) ([place]) 10/28/2014  . OSA (obstructive sleep apnea)    "tried mask; wear it off and on" (10/28/2014)    Past Surgical History:  Procedure Laterality Date  . 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 CARDIAC CATHETERIZATION N/A 10/29/2014   Procedure: Left Heart Cath and Coronary Angiography;  Surgeon: Troy Sine, MD;  Location: [place] [facility];  Service: Cardiovascular;  Laterality: N/A;  . CHOLESTEATOMA EXCISION Right 1990's  . LEFT HEART CATH AND CORONARY ANGIOGRAPHY N/A 05/25/2016   Procedure: Left Heart Cath and Coronary Angiography;  Surgeon: Sherren Mocha, MD;  Location: [place] [facility];  Service: Cardiovascular;  Laterality: N/A;  . NASAL SEPTOPLASTY W/ TURBINOPLASTY Bilateral 11/30/2017   Procedure: NASAL SEPTOPLASTY WITH TURBINATE REDUCTION;  Surgeon: Jerrell Belfast, MD;  Location: [place];  Service: ENT;  Laterality: Bilateral;  . TYMPANOSTOMY TUBE PLACEMENT Bilateral "as a kid"   "had one done as an adult too"  . WISDOM TOOTH EXTRACTION       reports that he has been smoking cigarettes. He has a 10.56 pack-year smoking history. He has never used smokeless tobacco. He reports current drug use. Drug: Marijuana. He reports that he does not drink alcohol.  Allergies  Allergen Reactions  . Morphine And Related Itching    Family History  Problem Relation Age of Onset  . Diverticulitis Mother   . Skin cancer Mother   .  Healthy Father   . Bradycardia Father   . Multiple sclerosis Maternal Grandfather   . Colon cancer Neg Hx   . Stomach cancer Neg Hx   . Rectal cancer Neg Hx   . Liver cancer Neg Hx   . Esophageal cancer Neg Hx      Prior to Admission medications   Medication Sig Start Date End Date Taking? Authorizing Provider  aspirin EC 81 MG tablet Take 81 mg by mouth daily.   Yes [provider]  atorvastatin (LIPITOR) 80 MG tablet Take 1 tablet (80 mg total) by mouth daily at 6 PM. 01/31/18  Yes  Weaver, Nicki Reaper T, PA-C  clopidogrel (PLAVIX) 75 MG tablet Take 1 tablet (75 mg total) by mouth daily. 01/31/18 01/31/19 Yes Weaver, Scott T, PA-C  cyclobenzaprine (FLEXERIL) 5 MG tablet Take 1 tablet (5 mg total) by mouth 3 (three) times daily as needed for muscle spasms. 03/08/18  Yes [name], NP  diclofenac sodium (VOLTAREN) 1 % GEL Apply 2 g topically 4 (four) times daily. Patient taking differently: Apply 2 g topically 4 (four) times daily as needed (pain).  08/12/17  Yes [name], NP  dicyclomine (BENTYL) 10 MG capsule Take 1 capsule (10 mg total) by mouth 4 (four) times daily as needed for spasms. For abdominal pain and bloating 08/26/16  Yes Ladene Artist, MD  gabapentin (NEURONTIN) 100 MG capsule Take 2 capsules (200 mg total) by mouth at bedtime. 05/31/18  Yes [name], NP  lisinopril (PRINIVIL,ZESTRIL) 20 MG tablet Take 1 tablet (20 mg total) by mouth daily. Please keep upcoming appt in November for future refills. Thank you Patient taking differently: Take 20 mg by mouth daily.  05/10/18  Yes [name], NP  loratadine (CLARITIN) 10 MG tablet Take 10 mg by mouth daily as needed for allergies.    Yes [provider]  Multiple Vitamin (MULTI-VITAMIN PO) Take 1 tablet by mouth daily.   Yes [provider]  nitroGLYCERIN (NITROSTAT) 0.4 MG SL tablet Place 1 tablet (0.4 mg total) under the tongue every 5 (five) minutes x 3 doses as needed for chest pain. 10/30/14  Yes Brett Canales, PA-C  pantoprazole (PROTONIX) 40 MG tablet TAKE 1 TABLET BY MOUTH DAILY. Patient taking differently: Take 40 mg by mouth daily.  05/08/18  Yes [name], NP  tamsulosin (FLOMAX) 0.4 MG CAPS capsule Take 1 capsule (0.4 mg total) by mouth daily. Patient not taking: Reported on 06/06/2018 10/10/17   [name], NP    Physical Exam: Vitals:   06/06/18 2015 06/06/18 2030 06/06/18 2045 06/06/18 2225  BP: 123/60 110/72 115/65 (!) 108/57    Pulse: 68 (!) 52 64 61  Resp: 13 17 15 16   Temp:    98.1 F (36.7 C)  TempSrc:    Oral  SpO2: 96% 97% 97% 99%  Weight:      Height:          Constitutional: NAD, calm, comfortable Vitals:   06/06/18 2015 06/06/18 2030 06/06/18 2045 06/06/18 2225  BP: 123/60 110/72 115/65 (!) 108/57  Pulse: 68 (!) 52 64 61  Resp: 13 17 15 16   Temp:    98.1 F (36.7 C)  TempSrc:    Oral  SpO2: 96% 97% 97% 99%  Weight:      Height:       Eyes: PERRL, lids and conjunctivae normal ENMT: Mucous membranes are moist. Posterior pharynx clear of any exudate or lesions.Normal  dentition.  Neck: normal, supple, no masses, no thyromegaly Respiratory: clear to auscultation bilaterally, no wheezing, no crackles. Normal respiratory effort. No accessory muscle use.  Cardiovascular: Regular rate and rhythm, no murmurs / rubs / gallops. No extremity edema. 2+ pedal pulses. No carotid bruits.  Abdomen: no tenderness, no masses palpated. No hepatosplenomegaly. Bowel sounds positive.  Musculoskeletal: no clubbing / cyanosis.  Amputated left upper extremity below the elbow good ROM, no contractures. Normal muscle tone.  Skin: no rashes, lesions, ulcers. No induration Neurologic: CN 2-12 grossly intact. Sensation intact, DTR normal. Strength 5/5 in all 4.  Psychiatric: Normal judgment and insight. Alert and oriented x 3. Normal mood.     Labs on Admission: I have personally reviewed following labs and imaging studies  CBC: Recent Labs  Lab 06/06/18 1645  WBC 8.0  HGB 14.2  HCT 43.3  MCV 94.3  PLT 854   Basic Metabolic Panel: Recent Labs  Lab 05/31/18 1607 06/06/18 1645  NA 139 137  K 4.2 4.0  CL 104 107  CO2 27 23  GLUCOSE 95 128*  BUN 19 21*  CREATININE 1.15 1.10  CALCIUM 8.9 8.7*   GFR: Estimated Creatinine Clearance: 90.6 mL/min (by C-G formula based on SCr of 1.1 mg/dL). Liver Function Tests: No results for input(s): AST, ALT, ALKPHOS, BILITOT, PROT, ALBUMIN in the last 168 hours. No  results for input(s): LIPASE, AMYLASE in the last 168 hours. No results for input(s): AMMONIA in the last 168 hours. Coagulation Profile: No results for input(s): INR, PROTIME in the last 168 hours. Cardiac Enzymes: No results for input(s): CKTOTAL, CKMB, CKMBINDEX, TROPONINI in the last 168 hours. BNP (last 3 results) No results for input(s): PROBNP in the last 8760 hours. HbA1C: No results for input(s): HGBA1C in the last 72 hours. CBG: No results for input(s): GLUCAP in the last 168 hours. Lipid Profile: No results for input(s): CHOL, HDL, LDLCALC, TRIG, CHOLHDL, LDLDIRECT in the last 72 hours. Thyroid Function Tests: No results for input(s): TSH, T4TOTAL, FREET4, T3FREE, THYROIDAB in the last 72 hours. Anemia Panel: No results for input(s): VITAMINB12, FOLATE, FERRITIN, TIBC, IRON, RETICCTPCT in the last 72 hours. Urine analysis:    Component Value Date/Time   COLORURINE YELLOW 10/04/2017 [place] 10/04/2017 1614   LABSPEC >=1.030 (A) 10/04/2017 1614   PHURINE 6.0 10/04/2017 1614   GLUCOSEU NEGATIVE 10/04/2017 1614   HGBUR TRACE-INTACT (A) 10/04/2017 1614   BILIRUBINUR NEGATIVE 10/04/2017 1614   BILIRUBINUR neg 02/02/2014 1459   KETONESUR NEGATIVE 10/04/2017 1614   PROTEINUR neg 02/02/2014 1459   UROBILINOGEN 0.2 10/04/2017 1614   NITRITE NEGATIVE 10/04/2017 1614   LEUKOCYTESUR NEGATIVE 10/04/2017 1614   Sepsis Labs: @LABRCNTIP (procalcitonin:4,lacticidven:4) )No results found for this or any previous visit (from the past 240 hour(s)).   Radiological Exams on Admission: Dg Chest 2 View  Result Date: 06/06/2018 CLINICAL DATA:  Mid chest pain. EXAM: CHEST - 2 VIEW COMPARISON:  None. FINDINGS: The heart size and mediastinal contours are within normal limits. Both lungs are clear. The visualized skeletal structures are unremarkable. IMPRESSION: No active cardiopulmonary disease. Electronically Signed   By: Dorise Bullion III M.D   On: 06/06/2018 17:26     EKG: Independently reviewed. EKG shows normal sinus rhythm with a rate of 78, premature atrial complex, nonspecific ST changes.  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  Could also be GI related especially with history of GERD and esophagitis.  At this point patient will be admitted for observation.  Check serial enzymes x3.  If negative patient most likely will have these to be more GI.  In the meantime continue on telemetry.  Cardiology will be notified in the morning.  #2 GERD: Continue with PPIs.  #3 hypertension: Continue with home regimen of blood pressure medications.  #4 hyperlipidemia: Continue with home statin.  #5 anxiety disorder: Again continue home regimen.   DVT prophylaxis: Heparin drip Code Status: Full code Family Communication: Wife at bedside Disposition Plan: Home Consults called: Cardiology Admission status: Observation  Severity of Illness: The appropriate patient status for this patient is OBSERVATION. Observation status is judged to be reasonable and necessary in order to provide the required intensity of service to ensure the patient's safety. The patient's presenting symptoms, physical exam findings, and initial radiographic and laboratory data in the context of their medical condition is felt to place them at decreased risk for further clinical deterioration. Furthermore, it is anticipated that the patient will be medically stable for discharge from the hospital within 2 midnights of admission. The following factors support the patient status of observation.   " The patient's presenting symptoms include chest pain. " The physical exam findings include no significant findings  except chronic findings. " The initial radiographic and laboratory data are normal findings.     Barbette Merino MD [facility] Pager [phone]  If 7PM-7AM, please contact night-coverage [email or web address] Password [facility]  06/06/2018, 10:39 PM

## 2018-06-06 NOTE — H&P (Signed)
History and Physical   [name] BWI:[id number] DOB: 10-Jun-1965 DOA: 06/06/2018  Referring MD/NP/PA: Dr. Ronnald Nian  PCP: [name], NP   Outpatient Specialists: Dr. Radford Pax  Patient coming from: Home  Chief Complaint: Chest pain and dizziness  HPI: Isaac Patel is a 53 y.o. male with medical history significant of hypertension, hyperlipidemia, GERD, coronary artery disease, history of NSTEMI in 2009.  49 and 2016 had stents.  Patient following up with cardiology was last visit in November 2019.  Patient presents to the ER with substernal chest pain on and off for the last 2 days.  He initially was having bloating and abdominal discomfort.  Symptoms have progressed to the left side of the chest.  More pressure.  Consistent with his previous cardiac pain.  Not relieved by his nitro.  Came to the ER where he has had some relief but still has 2 out of 10 pain in his chest.  Associated with some mild nausea.  No diaphoresis.  Patient's initial enzyme was negative.  EKG also showed no new findings.  Due to risk factors he is being admitted for MI rule out.  Cardiology has already been contacted..  ED Course: Vitals are stable, CBC and chemistry all stable.  Initial troponin 0 0.  Chest x-ray showed no active findings.  EKG also showed no significant findings.  Patient is being admitted for MI rule out.  Cardiology consulted and the recommended of the patient and trending enzymes.  Review of Systems: As per HPI otherwise 10 point review of systems negative.    Past Medical History:  Diagnosis Date  . CAD (coronary artery disease)    a.  Non-STEMI 8/16: LHC-proximal LAD 80% treated with a resolute DES, EF 50-55%;  b.  Echo 8/16:  Moderate LVH, EF 55-60%, normal wall motion, normal diastolic function, normal RV function  . Depression    hx  . GERD (gastroesophageal reflux disease)   . Headache    "maybe monthly" (10/28/2014)  . HLD (hyperlipidemia)   . Hypertension   . NSTEMI  (non-ST elevated myocardial infarction) ([place]) 10/28/2014  . OSA (obstructive sleep apnea)    "tried mask; wear it off and on" (10/28/2014)    Past Surgical History:  Procedure Laterality Date  . 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 CARDIAC CATHETERIZATION N/A 10/29/2014   Procedure: Left Heart Cath and Coronary Angiography;  Surgeon: Troy Sine, MD;  Location: [place] [facility];  Service: Cardiovascular;  Laterality: N/A;  . CHOLESTEATOMA EXCISION Right 1990's  . LEFT HEART CATH AND CORONARY ANGIOGRAPHY N/A 05/25/2016   Procedure: Left Heart Cath and Coronary Angiography;  Surgeon: Sherren Mocha, MD;  Location: [place] [facility];  Service: Cardiovascular;  Laterality: N/A;  . NASAL SEPTOPLASTY W/ TURBINOPLASTY Bilateral 11/30/2017   Procedure: NASAL SEPTOPLASTY WITH TURBINATE REDUCTION;  Surgeon: Jerrell Belfast, MD;  Location: [place];  Service: ENT;  Laterality: Bilateral;  . TYMPANOSTOMY TUBE PLACEMENT Bilateral "as a kid"   "had one done as an adult too"  . WISDOM TOOTH EXTRACTION       reports that he has been smoking cigarettes. He has a 10.56 pack-year smoking history. He has never used smokeless tobacco. He reports current drug use. Drug: Marijuana. He reports that he does not drink alcohol.  Allergies  Allergen Reactions  . Morphine And Related Itching    Family History  Problem Relation Age of Onset  . Diverticulitis Mother   . Skin cancer Mother   .  Healthy Father   . Bradycardia Father   . Multiple sclerosis Maternal Grandfather   . Colon cancer Neg Hx   . Stomach cancer Neg Hx   . Rectal cancer Neg Hx   . Liver cancer Neg Hx   . Esophageal cancer Neg Hx      Prior to Admission medications   Medication Sig Start Date End Date Taking? Authorizing Provider  aspirin EC 81 MG tablet Take 81 mg by mouth daily.   Yes [provider]  atorvastatin (LIPITOR) 80 MG tablet Take 1 tablet (80 mg total) by mouth daily at 6 PM. 01/31/18  Yes  Weaver, Nicki Reaper T, PA-C  clopidogrel (PLAVIX) 75 MG tablet Take 1 tablet (75 mg total) by mouth daily. 01/31/18 01/31/19 Yes Weaver, Scott T, PA-C  cyclobenzaprine (FLEXERIL) 5 MG tablet Take 1 tablet (5 mg total) by mouth 3 (three) times daily as needed for muscle spasms. 03/08/18  Yes [name], NP  diclofenac sodium (VOLTAREN) 1 % GEL Apply 2 g topically 4 (four) times daily. Patient taking differently: Apply 2 g topically 4 (four) times daily as needed (pain).  08/12/17  Yes [name], NP  dicyclomine (BENTYL) 10 MG capsule Take 1 capsule (10 mg total) by mouth 4 (four) times daily as needed for spasms. For abdominal pain and bloating 08/26/16  Yes Ladene Artist, MD  gabapentin (NEURONTIN) 100 MG capsule Take 2 capsules (200 mg total) by mouth at bedtime. 05/31/18  Yes [name], NP  lisinopril (PRINIVIL,ZESTRIL) 20 MG tablet Take 1 tablet (20 mg total) by mouth daily. Please keep upcoming appt in November for future refills. Thank you Patient taking differently: Take 20 mg by mouth daily.  05/10/18  Yes [name], NP  loratadine (CLARITIN) 10 MG tablet Take 10 mg by mouth daily as needed for allergies.    Yes [provider]  Multiple Vitamin (MULTI-VITAMIN PO) Take 1 tablet by mouth daily.   Yes [provider]  nitroGLYCERIN (NITROSTAT) 0.4 MG SL tablet Place 1 tablet (0.4 mg total) under the tongue every 5 (five) minutes x 3 doses as needed for chest pain. 10/30/14  Yes Brett Canales, PA-C  pantoprazole (PROTONIX) 40 MG tablet TAKE 1 TABLET BY MOUTH DAILY. Patient taking differently: Take 40 mg by mouth daily.  05/08/18  Yes [name], NP  tamsulosin (FLOMAX) 0.4 MG CAPS capsule Take 1 capsule (0.4 mg total) by mouth daily. Patient not taking: Reported on 06/06/2018 10/10/17   [name], NP    Physical Exam: Vitals:   06/06/18 2015 06/06/18 2030 06/06/18 2045 06/06/18 2225  BP: 123/60 110/72 115/65 (!) 108/57    Pulse: 68 (!) 52 64 61  Resp: 13 17 15 16   Temp:    98.1 F (36.7 C)  TempSrc:    Oral  SpO2: 96% 97% 97% 99%  Weight:      Height:          Constitutional: NAD, calm, comfortable Vitals:   06/06/18 2015 06/06/18 2030 06/06/18 2045 06/06/18 2225  BP: 123/60 110/72 115/65 (!) 108/57  Pulse: 68 (!) 52 64 61  Resp: 13 17 15 16   Temp:    98.1 F (36.7 C)  TempSrc:    Oral  SpO2: 96% 97% 97% 99%  Weight:      Height:       Eyes: PERRL, lids and conjunctivae normal ENMT: Mucous membranes are moist. Posterior pharynx clear of any exudate or lesions.Normal  dentition.  Neck: normal, supple, no masses, no thyromegaly Respiratory: clear to auscultation bilaterally, no wheezing, no crackles. Normal respiratory effort. No accessory muscle use.  Cardiovascular: Regular rate and rhythm, no murmurs / rubs / gallops. No extremity edema. 2+ pedal pulses. No carotid bruits.  Abdomen: no tenderness, no masses palpated. No hepatosplenomegaly. Bowel sounds positive.  Musculoskeletal: no clubbing / cyanosis. Amputation of left LE below elbow.. Good ROM, no contractures. Normal muscle tone.  Skin: no rashes, lesions, ulcers. No induration Neurologic: CN 2-12 grossly intact. Sensation intact, DTR normal. Strength 5/5 in all 4.  Psychiatric: Normal judgment and insight. Alert and oriented x 3. Normal mood.     Labs on Admission: I have personally reviewed following labs and imaging studies  CBC: Recent Labs  Lab 06/06/18 1645  WBC 8.0  HGB 14.2  HCT 43.3  MCV 94.3  PLT 700   Basic Metabolic Panel: Recent Labs  Lab 05/31/18 1607 06/06/18 1645  NA 139 137  K 4.2 4.0  CL 104 107  CO2 27 23  GLUCOSE 95 128*  BUN 19 21*  CREATININE 1.15 1.10  CALCIUM 8.9 8.7*   GFR: Estimated Creatinine Clearance: 90.6 mL/min (by C-G formula based on SCr of 1.1 mg/dL). Liver Function Tests: No results for input(s): AST, ALT, ALKPHOS, BILITOT, PROT, ALBUMIN in the last 168 hours. No results for  input(s): LIPASE, AMYLASE in the last 168 hours. No results for input(s): AMMONIA in the last 168 hours. Coagulation Profile: No results for input(s): INR, PROTIME in the last 168 hours. Cardiac Enzymes: No results for input(s): CKTOTAL, CKMB, CKMBINDEX, TROPONINI in the last 168 hours. BNP (last 3 results) No results for input(s): PROBNP in the last 8760 hours. HbA1C: No results for input(s): HGBA1C in the last 72 hours. CBG: No results for input(s): GLUCAP in the last 168 hours. Lipid Profile: No results for input(s): CHOL, HDL, LDLCALC, TRIG, CHOLHDL, LDLDIRECT in the last 72 hours. Thyroid Function Tests: No results for input(s): TSH, T4TOTAL, FREET4, T3FREE, THYROIDAB in the last 72 hours. Anemia Panel: No results for input(s): VITAMINB12, FOLATE, FERRITIN, TIBC, IRON, RETICCTPCT in the last 72 hours. Urine analysis:    Component Value Date/Time   COLORURINE YELLOW 10/04/2017 [place] 10/04/2017 1614   LABSPEC >=1.030 (A) 10/04/2017 1614   PHURINE 6.0 10/04/2017 1614   GLUCOSEU NEGATIVE 10/04/2017 1614   HGBUR TRACE-INTACT (A) 10/04/2017 1614   BILIRUBINUR NEGATIVE 10/04/2017 1614   BILIRUBINUR neg 02/02/2014 1459   KETONESUR NEGATIVE 10/04/2017 1614   PROTEINUR neg 02/02/2014 1459   UROBILINOGEN 0.2 10/04/2017 1614   NITRITE NEGATIVE 10/04/2017 1614   LEUKOCYTESUR NEGATIVE 10/04/2017 1614   Sepsis Labs: @LABRCNTIP (procalcitonin:4,lacticidven:4) )No results found for this or any previous visit (from the past 240 hour(s)).   Radiological Exams on Admission: Dg Chest 2 View  Result Date: 06/06/2018 CLINICAL DATA:  Mid chest pain. EXAM: CHEST - 2 VIEW COMPARISON:  None. FINDINGS: The heart size and mediastinal contours are within normal limits. Both lungs are clear. The visualized skeletal structures are unremarkable. IMPRESSION: No active cardiopulmonary disease. Electronically Signed   By: Dorise Bullion III M.D   On: 06/06/2018 17:26    EKG:  Independently reviewed. EKG shows normal sinus rhythm with a rate of 78, premature atrial complex, nonspecific ST changes.  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  Could also be GI related especially with history of GERD and esophagitis.  At this point patient will be admitted for observation.  Check serial enzymes x3.  If negative patient most likely will have these to be more GI.  In the meantime continue on telemetry.  Cardiology will be notified in the morning.  #2 GERD: Continue with PPIs.  #3 hypertension: Continue with home regimen of blood pressure medications.  #4 hyperlipidemia: Continue with home statin.  #5 anxiety disorder: Again continue home regimen.   DVT prophylaxis: Heparin drip Code Status: Full code Family Communication: Wife at bedside Disposition Plan: Home Consults called: Cardiology Admission status: Observation  Severity of Illness: The appropriate patient status for this patient is OBSERVATION. Observation status is judged to be reasonable and necessary in order to provide the required intensity of service to ensure the patient's safety. The patient's presenting symptoms, physical exam findings, and initial radiographic and laboratory data in the context of their medical condition is felt to place them at decreased risk for further clinical deterioration. Furthermore, it is anticipated that the patient will be medically stable for discharge from the hospital within 2 midnights of admission. The following factors support the patient status of observation.   " The patient's presenting symptoms include chest pain. " The physical exam findings include no significant findings except  chronic findings. " The initial radiographic and laboratory data are normal findings.     Barbette Merino MD [facility] Pager [phone]  If 7PM-7AM, please contact night-coverage [email or web address] Password [facility]  06/06/2018, 10:47 PM

## 2018-06-06 NOTE — Progress Notes (Signed)
ANTICOAGULATION CONSULT NOTE - Initial Consult  Pharmacy Consult for heparin Indication: chest pain/ACS  Allergies  Allergen Reactions  . Morphine And Related Itching    Patient Measurements: Height: 5\' 11"  (180.3 cm) Weight: 205 lb 14.6 oz (93.4 kg) IBW/kg (Calculated) : 75.3 Heparin Dosing Weight: 93.4 kg  Vital Signs: Temp: 97.6 F (36.4 C) (03/10 1635) Temp Source: Oral (03/10 1635) BP: 115/65 (03/10 2045) Pulse Rate: 64 (03/10 2045)  Labs: Recent Labs    06/06/18 1645  HGB 14.2  HCT 43.3  PLT 218  CREATININE 1.10    Estimated Creatinine Clearance: 90.6 mL/min (by C-G formula based on SCr of 1.1 mg/dL).   Medical History: Past Medical History:  Diagnosis Date  . CAD (coronary artery disease)    a.  Non-STEMI 8/16: LHC-proximal LAD 80% treated with a resolute DES, EF 50-55%;  b.  Echo 8/16:  Moderate LVH, EF 55-60%, normal wall motion, normal diastolic function, normal RV function  . Depression    hx  . GERD (gastroesophageal reflux disease)   . Headache    "maybe monthly" (10/28/2014)  . HLD (hyperlipidemia)   . Hypertension   . NSTEMI (non-ST elevated myocardial infarction) ([place]) 10/28/2014  . OSA (obstructive sleep apnea)    "tried mask; wear it off and on" (10/28/2014)    Assessment: 53 yo M presents with chest pain and dizziness. Pharmacy consulted to start heparin. No anticoag PTA. CBC stable.  Goal of Therapy:  Heparin level 0.3-0.7 units/ml Monitor platelets by anticoagulation protocol: Yes   Plan:  Give heparin 4,000 unit bolus Start heparin gtt at 1,100 units/hr Monitor daily heparin level, CBC, s/s of bleed  Dayquan Buys J 06/06/2018,8:58 PM

## 2018-06-06 NOTE — ED Triage Notes (Signed)
Pt. Stated, he took 3 NTG  And no better.

## 2018-06-06 NOTE — ED Triage Notes (Signed)
Pt. Stated, I started having some deep chest pain today with some dizziness.

## 2018-06-07 ENCOUNTER — Observation Stay (HOSPITAL_BASED_OUTPATIENT_CLINIC_OR_DEPARTMENT_OTHER): Payer: 59

## 2018-06-07 DIAGNOSIS — K210 Gastro-esophageal reflux disease with esophagitis: Secondary | ICD-10-CM | POA: Diagnosis not present

## 2018-06-07 DIAGNOSIS — F411 Generalized anxiety disorder: Secondary | ICD-10-CM

## 2018-06-07 DIAGNOSIS — R0789 Other chest pain: Secondary | ICD-10-CM

## 2018-06-07 DIAGNOSIS — Z720 Tobacco use: Secondary | ICD-10-CM

## 2018-06-07 DIAGNOSIS — R079 Chest pain, unspecified: Secondary | ICD-10-CM

## 2018-06-07 DIAGNOSIS — R011 Cardiac murmur, unspecified: Secondary | ICD-10-CM | POA: Diagnosis not present

## 2018-06-07 DIAGNOSIS — I2510 Atherosclerotic heart disease of native coronary artery without angina pectoris: Secondary | ICD-10-CM | POA: Diagnosis not present

## 2018-06-07 DIAGNOSIS — E785 Hyperlipidemia, unspecified: Secondary | ICD-10-CM | POA: Diagnosis not present

## 2018-06-07 DIAGNOSIS — I351 Nonrheumatic aortic (valve) insufficiency: Secondary | ICD-10-CM

## 2018-06-07 DIAGNOSIS — E7800 Pure hypercholesterolemia, unspecified: Secondary | ICD-10-CM

## 2018-06-07 DIAGNOSIS — G4733 Obstructive sleep apnea (adult) (pediatric): Secondary | ICD-10-CM | POA: Diagnosis not present

## 2018-06-07 DIAGNOSIS — I10 Essential (primary) hypertension: Secondary | ICD-10-CM

## 2018-06-07 DIAGNOSIS — S58112S Complete traumatic amputation at level between elbow and wrist, left arm, sequela: Secondary | ICD-10-CM | POA: Diagnosis not present

## 2018-06-07 DIAGNOSIS — R42 Dizziness and giddiness: Secondary | ICD-10-CM | POA: Diagnosis not present

## 2018-06-07 DIAGNOSIS — F419 Anxiety disorder, unspecified: Secondary | ICD-10-CM | POA: Diagnosis not present

## 2018-06-07 LAB — CBC
HCT: 40.3 % (ref 39.0–52.0)
Hemoglobin: 13.7 g/dL (ref 13.0–17.0)
MCH: 31.6 pg (ref 26.0–34.0)
MCHC: 34 g/dL (ref 30.0–36.0)
MCV: 92.9 fL (ref 80.0–100.0)
Platelets: 216 10*3/uL (ref 150–400)
RBC: 4.34 MIL/uL (ref 4.22–5.81)
RDW: 12.3 % (ref 11.5–15.5)
WBC: 8.2 10*3/uL (ref 4.0–10.5)
nRBC: 0 % (ref 0.0–0.2)

## 2018-06-07 LAB — ECHOCARDIOGRAM COMPLETE
Height: 71 in
Weight: 3372.16 oz

## 2018-06-07 LAB — TROPONIN I
Troponin I: <0.03 ng/mL (ref <0.03)
Troponin I: <0.03 ng/mL (ref <0.03)

## 2018-06-07 LAB — HIV ANTIBODY (ROUTINE TESTING W REFLEX): HIV Screen 4th Generation wRfx: NONREACTIVE

## 2018-06-07 LAB — TSH: TSH: 1.414 u[IU]/mL (ref 0.350–4.500)

## 2018-06-07 LAB — HEPARIN LEVEL (UNFRACTIONATED): Heparin Unfractionated: 0.37 IU/mL (ref 0.30–0.70)

## 2018-06-07 LAB — SEDIMENTATION RATE: SED RATE: 4 mm/h (ref 0–16)

## 2018-06-07 LAB — C-REACTIVE PROTEIN

## 2018-06-07 MED ORDER — ATORVASTATIN CALCIUM 80 MG PO TABS
80.0000 mg | ORAL_TABLET | Freq: Every day | ORAL | Status: DC
  Administered 2018-06-07: 80 mg via ORAL
  Filled 2018-06-07: qty 1

## 2018-06-07 MED ORDER — PANTOPRAZOLE SODIUM 40 MG PO TBEC
40.0000 mg | DELAYED_RELEASE_TABLET | Freq: Every day | ORAL | Status: DC
  Administered 2018-06-07 – 2018-06-08 (×2): 40 mg via ORAL
  Filled 2018-06-07 (×2): qty 1

## 2018-06-07 MED ORDER — GABAPENTIN 100 MG PO CAPS
200.0000 mg | ORAL_CAPSULE | Freq: Every day | ORAL | Status: DC
  Administered 2018-06-07: 200 mg via ORAL
  Filled 2018-06-07: qty 2

## 2018-06-07 MED ORDER — ALUM & MAG HYDROXIDE-SIMETH 200-200-20 MG/5ML PO SUSP
30.0000 mL | Freq: Once | ORAL | Status: AC
  Administered 2018-06-07: 30 mL via ORAL
  Filled 2018-06-07: qty 30

## 2018-06-07 MED ORDER — LISINOPRIL 10 MG PO TABS
10.0000 mg | ORAL_TABLET | Freq: Every day | ORAL | Status: DC
  Administered 2018-06-07 – 2018-06-08 (×2): 10 mg via ORAL
  Filled 2018-06-07 (×2): qty 1

## 2018-06-07 MED ORDER — CYCLOBENZAPRINE HCL 5 MG PO TABS: 5.0000 mg | ORAL_TABLET | Freq: Three times a day (TID) | ORAL | Status: DC | PRN

## 2018-06-07 MED ORDER — ASPIRIN EC 81 MG PO TBEC
81.0000 mg | DELAYED_RELEASE_TABLET | Freq: Every day | ORAL | Status: DC
  Administered 2018-06-08: 81 mg via ORAL
  Filled 2018-06-07: qty 1

## 2018-06-07 MED ORDER — ASPIRIN 81 MG PO CHEW
81.0000 mg | CHEWABLE_TABLET | Freq: Once | ORAL | Status: AC
  Administered 2018-06-07: 81 mg via ORAL
  Filled 2018-06-07: qty 1

## 2018-06-07 MED ORDER — TAMSULOSIN HCL 0.4 MG PO CAPS
0.4000 mg | ORAL_CAPSULE | Freq: Every day | ORAL | Status: DC
  Filled 2018-06-07: qty 1

## 2018-06-07 MED ORDER — ENOXAPARIN SODIUM 40 MG/0.4ML ~~LOC~~ SOLN
40.0000 mg | SUBCUTANEOUS | Status: DC
  Administered 2018-06-07: 40 mg via SUBCUTANEOUS
  Filled 2018-06-07: qty 0.4

## 2018-06-07 MED ORDER — SODIUM CHLORIDE 0.9 % IV SOLN
INTRAVENOUS | Status: DC
  Administered 2018-06-07 – 2018-06-08 (×2): via INTRAVENOUS

## 2018-06-07 MED ORDER — ALUM & MAG HYDROXIDE-SIMETH 200-200-20 MG/5ML PO SUSP: 30.0000 mL | ORAL | Status: DC | PRN

## 2018-06-07 MED ORDER — DICYCLOMINE HCL 10 MG PO CAPS: 10.0000 mg | ORAL_CAPSULE | Freq: Four times a day (QID) | ORAL | Status: DC | PRN

## 2018-06-07 NOTE — Progress Notes (Signed)
  Echocardiogram 2D Echocardiogram has been performed.  Isaac Patel 06/07/2018, 11:12 AM

## 2018-06-07 NOTE — Consult Note (Addendum)
Cardiology Consultation:   [name] MRN: [id number]; DOB: Aug 13, 1965  Admit date: 06/06/2018 Date of Consult: 06/07/2018  Primary Care Provider: [name], NP Primary Cardiologist: [name], MD  Primary Electrophysiologist:  None    Patient Profile:   Isaac Patel is a 53 y.o. male with a hx of CAD s/p NSTEMI in 10/2014 with DES to proximal LAD, hypertension, hyperlipidemia, GERD, OSA who is being seen today for the evaluation of pain at the request of Dr. Tamala Julian.  History of Present Illness:   [name] has a history of NSTEMI in 2016 treated with a drug-eluting stent to the LAD.  Cardiac catheterization in 04/2016 demonstrated patent stent in the LAD.  He has had some atypical chest pain off and on since then.  He continues on Plavix and statin.  He was last seen in our office on 01/31/2018 at which time he was doing well.  The patient has been having some stomach issues for the past couple of months being followed by his PCP.  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  He describes this at one time as left-sided chest pain/pressure/tightness that goes through to his back and then upon examination he shows that he has the most discomfort in the epigastric region.  The pain has been constant and is worse when he contracts his stomach muscles to sit up.  He says he has mild shortness of breath, no nausea.  He has had occasional feeling of skipped heartbeats but no sustained palpitations, no orthopnea or PND or edema.  His pain has been constant and is currently a 5-6/10 in intensity.  He tried 3 sublingual nitroglycerin tablets at home with no change in his discomfort.  He says this is not like the pain that he had prior to his stent which was a severe burning under the sternum.  He does not really feel like this is his heart but he just wants to know what the pain is.  The patient continues to smoke  half pack to 1 pack/day.  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  Troponins have remained negative.  EKG is without acute ischemia.  Chest x-ray is without active process.  Past Medical History:  Diagnosis Date  . CAD (coronary artery disease)    a.  Non-STEMI 8/16: LHC-proximal LAD 80% treated with a resolute DES, EF 50-55%;  b.  Echo 8/16:  Moderate LVH, EF 55-60%, normal wall motion, normal diastolic function, normal RV function  . Depression    hx  . GERD (gastroesophageal reflux disease)   . Headache    "maybe monthly" (10/28/2014)  . HLD (hyperlipidemia)   . Hypertension   . NSTEMI (non-ST elevated myocardial infarction) ([place]) 10/28/2014  . OSA (obstructive sleep apnea)    "tried mask; wear it off and on" (10/28/2014)    Past Surgical History:  Procedure Laterality Date  . 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 CARDIAC CATHETERIZATION N/A 10/29/2014   Procedure: Left Heart Cath and Coronary Angiography;  Surgeon: Troy Sine, MD;  Location: [facility];  Service: Cardiovascular;  Laterality: N/A;  . CHOLESTEATOMA EXCISION Right 1990's  . LEFT HEART CATH AND CORONARY ANGIOGRAPHY N/A 05/25/2016   Procedure: Left Heart Cath and Coronary Angiography;  Surgeon: Sherren Mocha, MD;  Location: [facility];  Service: Cardiovascular;  Laterality: N/A;  . NASAL SEPTOPLASTY W/ TURBINOPLASTY Bilateral 11/30/2017  Procedure: NASAL SEPTOPLASTY WITH TURBINATE REDUCTION;  Surgeon: Jerrell Belfast, MD;  Location: [facility];  Service: ENT;  Laterality: Bilateral;  . TYMPANOSTOMY TUBE PLACEMENT Bilateral "as a kid"   "had one done as an adult too"  . WISDOM TOOTH EXTRACTION       Home Medications:  Prior to Admission medications   Medication Sig Start Date End Date Taking? Authorizing Provider  aspirin EC 81 MG tablet Take 81 mg by mouth daily.   Yes [provider]  atorvastatin (LIPITOR) 80 MG tablet Take 1 tablet (80 mg total) by mouth daily  at 6 PM. 01/31/18  Yes Weaver, Nicki Reaper T, PA-C  clopidogrel (PLAVIX) 75 MG tablet Take 1 tablet (75 mg total) by mouth daily. 01/31/18 01/31/19 Yes Weaver, Scott T, PA-C  cyclobenzaprine (FLEXERIL) 5 MG tablet Take 1 tablet (5 mg total) by mouth 3 (three) times daily as needed for muscle spasms. 03/08/18  Yes [name], NP  diclofenac sodium (VOLTAREN) 1 % GEL Apply 2 g topically 4 (four) times daily. Patient taking differently: Apply 2 g topically 4 (four) times daily as needed (pain).  08/12/17  Yes [name], NP  dicyclomine (BENTYL) 10 MG capsule Take 1 capsule (10 mg total) by mouth 4 (four) times daily as needed for spasms. For abdominal pain and bloating 08/26/16  Yes Ladene Artist, MD  gabapentin (NEURONTIN) 100 MG capsule Take 2 capsules (200 mg total) by mouth at bedtime. 05/31/18  Yes [name], NP  lisinopril (PRINIVIL,ZESTRIL) 20 MG tablet Take 1 tablet (20 mg total) by mouth daily. Please keep upcoming appt in November for future refills. Thank you Patient taking differently: Take 20 mg by mouth daily.  05/10/18  Yes [name], NP  loratadine (CLARITIN) 10 MG tablet Take 10 mg by mouth daily as needed for allergies.    Yes [provider]  Multiple Vitamin (MULTI-VITAMIN PO) Take 1 tablet by mouth daily.   Yes [provider]  nitroGLYCERIN (NITROSTAT) 0.4 MG SL tablet Place 1 tablet (0.4 mg total) under the tongue every 5 (five) minutes x 3 doses as needed for chest pain. 10/30/14  Yes Brett Canales, PA-C  pantoprazole (PROTONIX) 40 MG tablet TAKE 1 TABLET BY MOUTH DAILY. Patient taking differently: Take 40 mg by mouth daily.  05/08/18  Yes [name], NP  tamsulosin (FLOMAX) 0.4 MG CAPS capsule Take 1 capsule (0.4 mg total) by mouth daily. Patient not taking: Reported on 06/06/2018 10/10/17   [name], NP    Inpatient Medications: Scheduled Meds: . sodium chloride flush  3 mL Intravenous Once   Continuous  Infusions: . sodium chloride 50 mL/hr at 06/06/18 2323  . heparin 1,100 Units/hr (06/06/18 2220)   PRN Meds: acetaminophen, ondansetron (ZOFRAN) IV  Allergies:    Allergies  Allergen Reactions  . Morphine And Related Itching    Social History:   Social History   Socioeconomic History  . Marital status: Married    Spouse name: Not on file  . Number of children: 2  . Years of education: 6  . Highest education level: Not on file  Occupational History  . Occupation: CMA  Social Needs  . Financial resource strain: Not on file  . Food insecurity:    Worry: Not on file    Inability: Not on file  . Transportation needs:    Medical: Not on file    Non-medical: Not on file  Tobacco Use  . Smoking status:  Current Some Day Smoker    Packs/day: 0.33    Years: 32.00    Pack years: 10.56    Types: Cigarettes  . Smokeless tobacco: Never Used  Substance and Sexual Activity  . Alcohol use: No    Alcohol/week: 2.0 - 3.0 standard drinks    Types: 2 - 3 Standard drinks or equivalent per week    Frequency: Never    Comment: stopped 06/19/2015,   . Drug use: Yes    Types: Marijuana    Comment: 05/24/2016  "stopped marijuana in the early 2000's"  . Sexual activity: Yes  Lifestyle  . Physical activity:    Days per week: Not on file    Minutes per session: Not on file  . Stress: Not on file  Relationships  . Social connections:    Talks on phone: Not on file    Gets together: Not on file    Attends religious service: Not on file    Active member of club or organization: Not on file    Attends meetings of clubs or organizations: Not on file    Relationship status: Not on file  . Intimate partner violence:    Fear of current or ex partner: Not on file    Emotionally abused: Not on file    Physically abused: Not on file    Forced sexual activity: Not on file  Other Topics Concern  . Not on file  Social History Narrative   Born and raised in [place], [place].  Currently resides in a  house with his wife and children. 1 dog. Fun: play computer games, sports.    Denies any religious beliefs effecting health care.    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 Diverticulitis Mother   . Skin cancer Mother   . Healthy Father   . Bradycardia Father   . Multiple sclerosis Maternal Grandfather   . Colon cancer Neg Hx   . Stomach cancer Neg Hx   . Rectal cancer Neg Hx   . Liver cancer Neg Hx   . Esophageal cancer Neg Hx      ROS:  Please see the history of present illness.   All other ROS reviewed and negative.     Physical Exam/Data:   Vitals:   06/06/18 2045 06/06/18 2225 06/06/18 2304 06/07/18 0623  BP: 115/65 (!) 108/57  111/65  Pulse: 64 61  65  Resp: 15 16  18   Temp:  98.1 F (36.7 C)  98.2 F (36.8 C)  TempSrc:  Oral  Oral  SpO2: 97% 99%  95%  Weight:   95.6 kg   Height:        Intake/Output Summary (Last 24 hours) at 06/07/2018 0721 Last data filed at 06/07/2018 0600 Gross per 24 hour  Intake 450.86 ml  Output -  Net 450.86 ml   Last 3 Weights 06/06/2018 06/06/2018 05/31/2018  Weight (lbs) 210 lb 12.2 oz 205 lb 14.6 oz 206 lb  Weight (kg) 95.6 kg 93.4 kg 93.441 kg     Body mass index is 29.4 kg/m.  General:  Well nourished, well developed, in no acute distress HEENT: normal Lymph: no adenopathy Neck: no JVD Endocrine:  No thryomegaly Vascular: No carotid bruits; FA pulses 2+ bilaterally without bruits  Cardiac:  normal S1, S2; RRR; squeaky rub vs  murmur Lungs:  clear to auscultation bilaterally, no wheezing, rhonchi or rales  Abd: soft, nontender, no hepatomegaly  Ext: no edema Musculoskeletal:  No deformities, BUE and BLE strength normal and equal Skin: warm and dry  Neuro:  CNs 2-12 intact, no focal abnormalities noted Psych:  Normal affect   EKG:  The EKG was personally reviewed and demonstrates: Sinus rhythm with PAC, no  ischemic changes Telemetry:  Telemetry was personally reviewed and demonstrates: Sinus rhythm with frequent PACs and occasional PVCs, rates in the 70s7  Relevant CV Studies:  [facility] 05/25/16 LAD prox stent ok LCx irregs RCA normal EF 55  Echo 10/30/14 Moderate LVH, EF 55-60%, normal wall motion, normal diastolic function, normal RV function  [facility] 10/29/14 LAD: Proximal-mid 80% EF 50-55% with small focal region of mild mid anterolateral hypokinesis PCI: 3 x 15 mm resolute DES to the LAD  Laboratory Data:  Chemistry Recent Labs  Lab 05/31/18 1607 06/06/18 1645  NA 139 137  K 4.2 4.0  CL 104 107  CO2 27 23  GLUCOSE 95 128*  BUN 19 21*  CREATININE 1.15 1.10  CALCIUM 8.9 8.7*  GFRNONAA  --  >60  GFRAA  --  >60  ANIONGAP  --  7    No results for input(s): PROT, ALBUMIN, AST, ALT, ALKPHOS, BILITOT in the last 168 hours. Hematology Recent Labs  Lab 06/06/18 1645 06/07/18 0411  WBC 8.0 8.2  RBC 4.59 4.34  HGB 14.2 13.7  HCT 43.3 40.3  MCV 94.3 92.9  MCH 30.9 31.6  MCHC 32.8 34.0  RDW 12.3 12.3  PLT 218 216   Cardiac Enzymes Recent Labs  Lab 06/06/18 2228 06/07/18 0149 06/07/18 0411  TROPONINI <0.03 <0.03 <0.03    Recent Labs  Lab 06/06/18 1654 06/06/18 2044  TROPIPOC 0.00 0.01    BNPNo results for input(s): BNP, PROBNP in the last 168 hours.  DDimer No results for input(s): DDIMER in the last 168 hours.  Radiology/Studies:  Dg Chest 2 View  Result Date: 06/06/2018 CLINICAL DATA:  Mid chest pain. EXAM: CHEST - 2 VIEW COMPARISON:  None. FINDINGS: The heart size and mediastinal contours are within normal limits. Both lungs are clear. The visualized skeletal structures are unremarkable. IMPRESSION: No active cardiopulmonary disease. Electronically Signed   By: Dorise Bullion III M.D   On: 06/06/2018 17:26    Assessment and Plan:   Chest pain -Patient is having left-sided chest pain that radiates to the back but also describes epigastric pain that is worse  with tightening his stomach muscles.  He has mild shortness of breath.  His pain has been constant and is still present this morning.  He has been having abdominal discomfort for a couple of months. -Patient had a recent viral illness about a week ago with nasal congestion and mild cough, body aches, no fever. -Troponins negative x4 -EKG without acute ischemic changes -Chest x-ray shows no active cardiopulmonary disease -With the degree of constant discomfort, the fact that troponins have remained negative, I do not suspect myocardial ischemia. He does have a Squeaky rub vs murmur. Will order an echocardiogram to evaluate for pericardial effusion or inflammation, possible pericarditis in setting of recent viral illness. Will also order sed rate and CRP.  CAD -History of NSTEMI in 2016 with DES to LAD.  Cardiac cath in 04/2016 demonstrated patent stent to the LAD. -Patient continues on Plavix, aspirin and statin. Can stop Plavix due to time since intervention.   Hypertension -Has been managed with  lisinopril 20 mg daily -Blood pressure is currently well controlled  Hyperlipidemia -On atorvastatin 80 mg daily -Last lipid panel in 08/2017 showed LDL of 58 which is at goal of <70   For questions or updates, please contact [facility] Please consult [email or web address] for contact info under     Signed, Daune Perch, NP  06/07/2018 7:21 AM  Patient seen and examined and history reviewed. Agree with above findings and plan.  53 yo WM seen for evaluation of chest pain. He has a history of HTN, HLD, CAD. S/p stenting of the LAD in 2016 with 3.0 x 15 mm Resolute stent. Repeat cath in 2018 showed widely patent stent. Last week he had a "cold" with cough, sore throat, no fever. Other family members had it as well. No recent travel. Since yesterday he has complained of persistent mid sternal chest pain radiating to the back. Mild SOB. Pain is constant and unremitting. Not really related to position  and not clearly worse with cough or deep breathing. No relief with Ntg. No prior history of cardiac murmur.   On exam he appears uncomfortable.  VSS. Telemetry shows NSR with frequent PACs and PVCs No JVD or bruit Lungs are clear.  CV RRR with gr 2-3/6 high pitched diastolic murmur RUSB radiating to the apex. No systolic murmur, rub or gallop' Abd- negative Ext no edema. Prior left arm amputation.  Ecg is normal Troponin negative x 3 CXR is normal  Impression 1. Atypical chest pain. No evidence of ACS and symptoms are very atypical for angina. Doubt PE or aortic dissection. I am concerned about the murmur noted on exam. No history of prior murmur and Echo in 2016 was normal. Doubt pericarditis but will check sed rat and CRP. Will check Echo today. Will discontinue heparin. 2. Murmur - newly diagnosed. Exam c/w Aortic insufficiency. With recent illness need to consider SBE but no fever and WBC is normal. Will await Echo results. 3. CAD with stent of LAD in 2016. No real indication for long term DAPT. Will discontinue Plavix. Continue ASA 4. HTN controlled on current meds. 5. HLD on statin. Good control.  Taresa [place], [place] 06/07/2018 8:58 AM

## 2018-06-07 NOTE — Progress Notes (Addendum)
Progress Note    Isaac Patel  ZOX:[id number] DOB: [birth year]  DOA: 06/06/2018 PCP: [name], NP    Brief Narrative:   Chief complaint: Chest pain and dizziness  Medical records reviewed and are as summarized below:  Isaac Patel is an 53 y.o. male with past medical history significant for HTN, HLD, CAD s/p stent, NSTEMI, and GERD; who presents with complaints of substernal chest pain intermittently over last 2 days.  Assessment/Plan:   Principal Problem:   Chest pain Active Problems:   OSA (obstructive sleep apnea)   CAD (coronary artery disease)   Tobacco abuse   HLD (hyperlipidemia)   Anxiety state   Amputation of arm below elbow (HCC)   GERD with esophagitis   Essential hypertension   Heart murmur   Aortic insufficiency  Chest pain, atypical: Patient presents with complaints of continued substernal chest pain.  EKG without significant ischemic changes and troponins have been negative.  Question if symptoms were related with recent viral illness, but CRP and ESR within normal limits making infection less likely.  Chest x-ray was otherwise clear of any acute abnormalities.  Thyroid studies were noted to be within normal limits.  Dr. [place] of cardiology consulted recommended echocardiogram, but felt chest pain symptoms less likely cardiac in nature.  Echocardiogram revealed EF 55 to 60% with moderate to severe aortic insufficiency.  Suspect chest pain symptoms less likely cardiac in nature and could likely be related to a GI cause. -Continue aspirin and statin -GI cocktail as needed -Continue Protonix   Aortic insufficiency: Acute.  Patient was noted to have a significant cardiac murmur for which echocardiogram revealed moderate to severe aortic insufficiency.  Cardiology recommending transesophageal echocardiogram in a.m. -N.p.o. after midnight for TEE in a.m. -Check ANA with reflex and RPR in a.m.  Coronary artery disease, NSTEMI: Patient with  previous history of stent placement to the LAD after NSTEMI in 2016. Patient reports that he still been taking aspirin and Plavix at home since that time. -Continue aspirin and statin -Question need of continued dual antiplatelet therapy  Essential hypertension -Continue lisinopril  Hyperlipidemia: Last lipid panel revealed total cholesterol 124, HDL 35, LDL 58, and triglycerides 151 in 08/2017. -Continue atorvastatin  History of anxiety disorder: Patient currently not treated with any medications.  GERD -Continue PPI as seen above -May benefit from GI referral  Family history of factor H deficiency: Patient's father reportedly has factor H deficiency. -May warrant further investigation of factor VIII deficiency in outpatient setting  Tobacco abuse: Patient still reports smoking cigarettes.  -Counseled on the need of cessation of tobacco use.  Amputation below elbow  Body mass index is 29.4 kg/m.   Family Communication/Anticipated D/C date and plan/Code Status   DVT prophylaxis: Lovenox ordered. Code Status: Full Code.  Family Communication: Discussed plan of care with the patient and family present at bedside Disposition Plan: Possible discharge home in 1 to 2 days as aortic insufficiency fully evaluated.   Medical Consultants:    Cardiology Dr. Martinique   Anti-Infectives:    None  Subjective:   Patient reports that he still has substernal discomfort that he describes this more so epigastric in nature.  He is on acid reflux medicine of Protonix.  Denies having any vomiting or diarrhea symptoms.  Wife makes note of a factor H deficiency disorder which patient's father also has heart issues.  Objective:    Vitals:   06/06/18 2225 06/06/18 2304 06/07/18 0623 06/07/18 1102  BP: (!) 108/57  111/65 (!) 112/56  Pulse: 61  65 70  Resp: 16  18 19  Temp: 98.1 F (36.7 C)  98.2 F (36.8 C) 98.1 F (36.7 C)  TempSrc: Oral  Oral Oral  SpO2: 99%  95% 93%  Weight:  95.6  kg    Height:        Intake/Output Summary (Last 24 hours) at 06/07/2018 1604 Last data filed at 06/07/2018 0738 Gross per 24 hour  Intake 468.81 ml  Output -  Net 468.81 ml   Filed Weights   06/06/18 1633 06/06/18 2304  Weight: 93.4 kg 95.6 kg    Exam: Constitutional: Middle-aged male in NAD, calm, comfortable Eyes: PERRL, lids and conjunctivae normal ENMT: Mucous membranes are moist. Posterior pharynx clear of any exudate or lesions.  Neck: normal, supple, no masses, no thyromegaly Respiratory: clear to auscultation bilaterally, no wheezing, no crackles. Normal respiratory effort. No accessory muscle use.  Cardiovascular: Regular rate and rhythm, crescendo decrescendo murmur 3/6.  Trace lower extremity edema. 2+ pedal pulses. No carotid bruits.  Abdomen: no tenderness, no masses palpated. No hepatosplenomegaly. Bowel sounds positive.  Musculoskeletal: no clubbing / cyanosis. N amputation of the left arm below elbow  skin: no rashes, lesions, ulcers. No induration Neurologic: CN 2-12 grossly intact. Sensation intact, DTR normal. Strength 5/5 in all 4.  Psychiatric: Normal judgment and insight. Alert and oriented x 3. Normal mood.    Data Reviewed:   I have personally reviewed following labs and imaging studies:  Labs: Labs show the following:   Basic Metabolic Panel: Recent Labs  Lab 05/31/18 1607 06/06/18 1645  NA 139 137  K 4.2 4.0  CL 104 107  CO2 27 23  GLUCOSE 95 128*  BUN 19 21*  CREATININE 1.15 1.10  CALCIUM 8.9 8.7*   GFR Estimated Creatinine Clearance: 91.6 mL/min (by C-G formula based on SCr of 1.1 mg/dL). Liver Function Tests: No results for input(s): AST, ALT, ALKPHOS, BILITOT, PROT, ALBUMIN in the last 168 hours. No results for input(s): LIPASE, AMYLASE in the last 168 hours. No results for input(s): AMMONIA in the last 168 hours. Coagulation profile No results for input(s): INR, PROTIME in the last 168 hours.  CBC: Recent Labs  Lab 06/06/18  1645 06/07/18 0411  WBC 8.0 8.2  HGB 14.2 13.7  HCT 43.3 40.3  MCV 94.3 92.9  PLT 218 216   Cardiac Enzymes: Recent Labs  Lab 06/06/18 2228 06/07/18 0149 06/07/18 0411  TROPONINI <0.03 <0.03 <0.03   BNP (last 3 results) No results for input(s): PROBNP in the last 8760 hours. CBG: No results for input(s): GLUCAP in the last 168 hours. D-Dimer: No results for input(s): DDIMER in the last 72 hours. Hgb A1c: No results for input(s): HGBA1C in the last 72 hours. Lipid Profile: No results for input(s): CHOL, HDL, LDLCALC, TRIG, CHOLHDL, LDLDIRECT in the last 72 hours. Thyroid function studies: Recent Labs    06/07/18 0947  TSH 1.414   Anemia work up: No results for input(s): VITAMINB12, FOLATE, FERRITIN, TIBC, IRON, RETICCTPCT in the last 72 hours. Sepsis Labs: Recent Labs  Lab 06/06/18 1645 06/07/18 0411  WBC 8.0 8.2    Microbiology No results found for this or any previous visit (from the past 240 hour(s)).  Procedures and diagnostic studies:   Echocardiogram 06/07/2018: Impression 1. The left ventricle has normal systolic function, with an ejection fraction of 55-60%. The cavity size was moderately dilated. Left ventricular diastolic Doppler parameters are indeterminate.  2. The right ventricle   has normal systolic function. The cavity was normal. There is no increase in right ventricular wall thickness. Right ventricular systolic pressure is mildly elevated with an estimated pressure of 42.2 mmHg.  3. Mild thickening of the mitral valve leaflet. Mild calcification of the mitral valve leaflet.  4. The aortic valve is abnormal Mild thickening of the aortic valve Mild calcification of the aortic valve. Aortic valve regurgitation is moderate to severe by color flow Doppler.  5. Moderate (based on pressure half time of 251 ms) to severe (based on vena contracta of 0.93 cm) aortic regurgitation. Cannot definitively determine if valve is bicuspid or tricuspid.  6. The  inferior vena cava was dilated in size with <50% respiratory variability.  Dg Chest 2 View  Result Date: 06/06/2018 CLINICAL DATA:  Mid chest pain. EXAM: CHEST - 2 VIEW COMPARISON:  None. FINDINGS: The heart size and mediastinal contours are within normal limits. Both lungs are clear. The visualized skeletal structures are unremarkable. IMPRESSION: No active cardiopulmonary disease. Electronically Signed   By: Dorise Bullion III M.D   On: 06/06/2018 17:26    Medications:   . atorvastatin  80 mg Oral q1800  . lisinopril  10 mg Oral Daily  . pantoprazole  40 mg Oral Daily  . sodium chloride flush  3 mL Intravenous Once  . tamsulosin  0.4 mg Oral Daily   Continuous Infusions:   LOS: 0 days   Anika Shore A Anelly Samarin  [facility]   *Please refer to [email or web address], password [email or web address] to get updated schedule on who will round on this patient, as hospitalists switch teams weekly. If 7PM-7AM, please contact night-coverage at [email or web address], password [email or web address] for any overnight needs.

## 2018-06-07 NOTE — H&P (View-Only) (Signed)
Cardiology Consultation:   [name] MRN: [id number]; DOB: [birth year]  Admit date: 06/06/2018 Date of Consult: 06/07/2018  Primary Care Provider: [name], NP Primary Cardiologist: [name], MD  Primary Electrophysiologist:  None    Patient Profile:   Isaac Patel is a 53 y.o. male with a hx of CAD s/p NSTEMI in 10/2014 with DES to proximal LAD, hypertension, hyperlipidemia, GERD, OSA who is being seen today for the evaluation of pain at the request of Dr. Tamala Julian.  History of Present Illness:   Isaac Patel has a history of NSTEMI in 2016 treated with a drug-eluting stent to the LAD.  Cardiac catheterization in 04/2016 demonstrated patent stent in the LAD.  He has had some atypical chest pain off and on since then.  He continues on Plavix and statin.  He was last seen in our office on 01/31/2018 at which time he was doing well.  The patient has been having some stomach issues for the past couple of months being followed by his PCP.  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  He describes this at one time as left-sided chest pain/pressure/tightness that goes through to his back and then upon examination he shows that he has the most discomfort in the epigastric region.  The pain has been constant and is worse when he contracts his stomach muscles to sit up.  He says he has mild shortness of breath, no nausea.  He has had occasional feeling of skipped heartbeats but no sustained palpitations, no orthopnea or PND or edema.  His pain has been constant and is currently a 5-6/10 in intensity.  He tried 3 sublingual nitroglycerin tablets at home with no change in his discomfort.  He says this is not like the pain that he had prior to his stent which was a severe burning under the sternum.  He does not really feel like this is his heart but he just wants to know what the pain is.  The patient continues to smoke  half pack to 1 pack/day.  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  Troponins have remained negative.  EKG is without acute ischemia.  Chest x-ray is without active process.  Past Medical History:  Diagnosis Date  . CAD (coronary artery disease)    a.  Non-STEMI 8/16: LHC-proximal LAD 80% treated with a resolute DES, EF 50-55%;  b.  Echo 8/16:  Moderate LVH, EF 55-60%, normal wall motion, normal diastolic function, normal RV function  . Depression    hx  . GERD (gastroesophageal reflux disease)   . Headache    "maybe monthly" (10/28/2014)  . HLD (hyperlipidemia)   . Hypertension   . NSTEMI (non-ST elevated myocardial infarction) ([place]) 10/28/2014  . OSA (obstructive sleep apnea)    "tried mask; wear it off and on" (10/28/2014)    Past Surgical History:  Procedure Laterality Date  . 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 CARDIAC CATHETERIZATION N/A 10/29/2014   Procedure: Left Heart Cath and Coronary Angiography;  Surgeon: Troy Sine, MD;  Location: [facility];  Service: Cardiovascular;  Laterality: N/A;  . CHOLESTEATOMA EXCISION Right 1990's  . LEFT HEART CATH AND CORONARY ANGIOGRAPHY N/A 05/25/2016   Procedure: Left Heart Cath and Coronary Angiography;  Surgeon: Sherren Mocha, MD;  Location: [facility];  Service: Cardiovascular;  Laterality: N/A;  . NASAL SEPTOPLASTY W/ TURBINOPLASTY Bilateral 11/30/2017  Procedure: NASAL SEPTOPLASTY WITH TURBINATE REDUCTION;  Surgeon: Jerrell Belfast, MD;  Location: [facility];  Service: ENT;  Laterality: Bilateral;  . TYMPANOSTOMY TUBE PLACEMENT Bilateral "as a kid"   "had one done as an adult too"  . WISDOM TOOTH EXTRACTION       Home Medications:  Prior to Admission medications   Medication Sig Start Date End Date Taking? Authorizing Provider  aspirin EC 81 MG tablet Take 81 mg by mouth daily.   Yes [provider]  atorvastatin (LIPITOR) 80 MG tablet Take 1 tablet (80 mg total) by mouth daily  at 6 PM. 01/31/18  Yes Weaver, Nicki Reaper T, PA-C  clopidogrel (PLAVIX) 75 MG tablet Take 1 tablet (75 mg total) by mouth daily. 01/31/18 01/31/19 Yes Weaver, Scott T, PA-C  cyclobenzaprine (FLEXERIL) 5 MG tablet Take 1 tablet (5 mg total) by mouth 3 (three) times daily as needed for muscle spasms. 03/08/18  Yes [name], NP  diclofenac sodium (VOLTAREN) 1 % GEL Apply 2 g topically 4 (four) times daily. Patient taking differently: Apply 2 g topically 4 (four) times daily as needed (pain).  08/12/17  Yes [name], NP  dicyclomine (BENTYL) 10 MG capsule Take 1 capsule (10 mg total) by mouth 4 (four) times daily as needed for spasms. For abdominal pain and bloating 08/26/16  Yes Ladene Artist, MD  gabapentin (NEURONTIN) 100 MG capsule Take 2 capsules (200 mg total) by mouth at bedtime. 05/31/18  Yes [name], NP  lisinopril (PRINIVIL,ZESTRIL) 20 MG tablet Take 1 tablet (20 mg total) by mouth daily. Please keep upcoming appt in November for future refills. Thank you Patient taking differently: Take 20 mg by mouth daily.  05/10/18  Yes [name], NP  loratadine (CLARITIN) 10 MG tablet Take 10 mg by mouth daily as needed for allergies.    Yes [provider]  Multiple Vitamin (MULTI-VITAMIN PO) Take 1 tablet by mouth daily.   Yes [provider]  nitroGLYCERIN (NITROSTAT) 0.4 MG SL tablet Place 1 tablet (0.4 mg total) under the tongue every 5 (five) minutes x 3 doses as needed for chest pain. 10/30/14  Yes Brett Canales, PA-C  pantoprazole (PROTONIX) 40 MG tablet TAKE 1 TABLET BY MOUTH DAILY. Patient taking differently: Take 40 mg by mouth daily.  05/08/18  Yes [name], NP  tamsulosin (FLOMAX) 0.4 MG CAPS capsule Take 1 capsule (0.4 mg total) by mouth daily. Patient not taking: Reported on 06/06/2018 10/10/17   [name], NP    Inpatient Medications: Scheduled Meds: . sodium chloride flush  3 mL Intravenous Once   Continuous  Infusions: . sodium chloride 50 mL/hr at 06/06/18 2323  . heparin 1,100 Units/hr (06/06/18 2220)   PRN Meds: acetaminophen, ondansetron (ZOFRAN) IV  Allergies:    Allergies  Allergen Reactions  . Morphine And Related Itching    Social History:   Social History   Socioeconomic History  . Marital status: Married    Spouse name: Not on file  . Number of children: 2  . Years of education: 41  . Highest education level: Not on file  Occupational History  . Occupation: CMA  Social Needs  . Financial resource strain: Not on file  . Food insecurity:    Worry: Not on file    Inability: Not on file  . Transportation needs:    Medical: Not on file    Non-medical: Not on file  Tobacco Use  . Smoking status:  Current Some Day Smoker    Packs/day: 0.33    Years: 32.00    Pack years: 10.56    Types: Cigarettes  . Smokeless tobacco: Never Used  Substance and Sexual Activity  . Alcohol use: No    Alcohol/week: 2.0 - 3.0 standard drinks    Types: 2 - 3 Standard drinks or equivalent per week    Frequency: Never    Comment: stopped 06/19/2015,   . Drug use: Yes    Types: Marijuana    Comment: 05/24/2016  "stopped marijuana in the early 2000's"  . Sexual activity: Yes  Lifestyle  . Physical activity:    Days per week: Not on file    Minutes per session: Not on file  . Stress: Not on file  Relationships  . Social connections:    Talks on phone: Not on file    Gets together: Not on file    Attends religious service: Not on file    Active member of club or organization: Not on file    Attends meetings of clubs or organizations: Not on file    Relationship status: Not on file  . Intimate partner violence:    Fear of current or ex partner: Not on file    Emotionally abused: Not on file    Physically abused: Not on file    Forced sexual activity: Not on file  Other Topics Concern  . Not on file  Social History Narrative   Born and raised in [place], [place].  Currently resides in a  house with his wife and children. 1 dog. Fun: play computer games, sports.    Denies any religious beliefs effecting health care.    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 Diverticulitis Mother   . Skin cancer Mother   . Healthy Father   . Bradycardia Father   . Multiple sclerosis Maternal Grandfather   . Colon cancer Neg Hx   . Stomach cancer Neg Hx   . Rectal cancer Neg Hx   . Liver cancer Neg Hx   . Esophageal cancer Neg Hx      ROS:  Please see the history of present illness.   All other ROS reviewed and negative.     Physical Exam/Data:   Vitals:   06/06/18 2045 06/06/18 2225 06/06/18 2304 06/07/18 0623  BP: 115/65 (!) 108/57  111/65  Pulse: 64 61  65  Resp: 15 16  18   Temp:  98.1 F (36.7 C)  98.2 F (36.8 C)  TempSrc:  Oral  Oral  SpO2: 97% 99%  95%  Weight:   95.6 kg   Height:        Intake/Output Summary (Last 24 hours) at 06/07/2018 0721 Last data filed at 06/07/2018 0600 Gross per 24 hour  Intake 450.86 ml  Output -  Net 450.86 ml   Last 3 Weights 06/06/2018 06/06/2018 05/31/2018  Weight (lbs) 210 lb 12.2 oz 205 lb 14.6 oz 206 lb  Weight (kg) 95.6 kg 93.4 kg 93.441 kg     Body mass index is 29.4 kg/m.  General:  Well nourished, well developed, in no acute distress HEENT: normal Lymph: no adenopathy Neck: no JVD Endocrine:  No thryomegaly Vascular: No carotid bruits; FA pulses 2+ bilaterally without bruits  Cardiac:  normal S1, S2; RRR; squeaky rub vs  murmur Lungs:  clear to auscultation bilaterally, no wheezing, rhonchi or rales  Abd: soft, nontender, no hepatomegaly  Ext: no edema Musculoskeletal:  No deformities, BUE and BLE strength normal and equal Skin: warm and dry  Neuro:  CNs 2-12 intact, no focal abnormalities noted Psych:  Normal affect   EKG:  The EKG was personally reviewed and demonstrates: Sinus rhythm with PAC, no  ischemic changes Telemetry:  Telemetry was personally reviewed and demonstrates: Sinus rhythm with frequent PACs and occasional PVCs, rates in the 70s7  Relevant CV Studies:  [facility] 05/25/16 LAD prox stent ok LCx irregs RCA normal EF 55  Echo 10/30/14 Moderate LVH, EF 55-60%, normal wall motion, normal diastolic function, normal RV function  [facility] 10/29/14 LAD: Proximal-mid 80% EF 50-55% with small focal region of mild mid anterolateral hypokinesis PCI: 3 x 15 mm resolute DES to the LAD  Laboratory Data:  Chemistry Recent Labs  Lab 05/31/18 1607 06/06/18 1645  NA 139 137  K 4.2 4.0  CL 104 107  CO2 27 23  GLUCOSE 95 128*  BUN 19 21*  CREATININE 1.15 1.10  CALCIUM 8.9 8.7*  GFRNONAA  --  >60  GFRAA  --  >60  ANIONGAP  --  7    No results for input(s): PROT, ALBUMIN, AST, ALT, ALKPHOS, BILITOT in the last 168 hours. Hematology Recent Labs  Lab 06/06/18 1645 06/07/18 0411  WBC 8.0 8.2  RBC 4.59 4.34  HGB 14.2 13.7  HCT 43.3 40.3  MCV 94.3 92.9  MCH 30.9 31.6  MCHC 32.8 34.0  RDW 12.3 12.3  PLT 218 216   Cardiac Enzymes Recent Labs  Lab 06/06/18 2228 06/07/18 0149 06/07/18 0411  TROPONINI <0.03 <0.03 <0.03    Recent Labs  Lab 06/06/18 1654 06/06/18 2044  TROPIPOC 0.00 0.01    BNPNo results for input(s): BNP, PROBNP in the last 168 hours.  DDimer No results for input(s): DDIMER in the last 168 hours.  Radiology/Studies:  Dg Chest 2 View  Result Date: 06/06/2018 CLINICAL DATA:  Mid chest pain. EXAM: CHEST - 2 VIEW COMPARISON:  None. FINDINGS: The heart size and mediastinal contours are within normal limits. Both lungs are clear. The visualized skeletal structures are unremarkable. IMPRESSION: No active cardiopulmonary disease. Electronically Signed   By: Dorise Bullion III M.D   On: 06/06/2018 17:26    Assessment and Plan:   Chest pain -Patient is having left-sided chest pain that radiates to the back but also describes epigastric pain that is worse  with tightening his stomach muscles.  He has mild shortness of breath.  His pain has been constant and is still present this morning.  He has been having abdominal discomfort for a couple of months. -Patient had a recent viral illness about a week ago with nasal congestion and mild cough, body aches, no fever. -Troponins negative x4 -EKG without acute ischemic changes -Chest x-ray shows no active cardiopulmonary disease -With the degree of constant discomfort, the fact that troponins have remained negative, I do not suspect myocardial ischemia. He does have a Squeaky rub vs murmur. Will order an echocardiogram to evaluate for pericardial effusion or inflammation, possible pericarditis in setting of recent viral illness. Will also order sed rate and CRP.  CAD -History of NSTEMI in 2016 with DES to LAD.  Cardiac cath in 04/2016 demonstrated patent stent to the LAD. -Patient continues on Plavix, aspirin and statin. Can stop Plavix due to time since intervention.   Hypertension -Has been managed with  lisinopril 20 mg daily -Blood pressure is currently well controlled  Hyperlipidemia -On atorvastatin 80 mg daily -Last lipid panel in 08/2017 showed LDL of 58 which is at goal of <70   For questions or updates, please contact Industry Please consult [email or web address] for contact info under     Signed, Daune Perch, NP  06/07/2018 7:21 AM  Patient seen and examined and history reviewed. Agree with above findings and plan.  53 yo WM seen for evaluation of chest pain. He has a history of HTN, HLD, CAD. S/p stenting of the LAD in 2016 with 3.0 x 15 mm Resolute stent. Repeat cath in 2018 showed widely patent stent. Last week he had a "cold" with cough, sore throat, no fever. Other family members had it as well. No recent travel. Since yesterday he has complained of persistent mid sternal chest pain radiating to the back. Mild SOB. Pain is constant and unremitting. Not really related to position  and not clearly worse with cough or deep breathing. No relief with Ntg. No prior history of cardiac murmur.   On exam he appears uncomfortable.  VSS. Telemetry shows NSR with frequent PACs and PVCs No JVD or bruit Lungs are clear.  CV RRR with gr 2-3/6 high pitched diastolic murmur RUSB radiating to the apex. No systolic murmur, rub or gallop' Abd- negative Ext no edema. Prior left arm amputation.  Ecg is normal Troponin negative x 3 CXR is normal  Impression 1. Atypical chest pain. No evidence of ACS and symptoms are very atypical for angina. Doubt PE or aortic dissection. I am concerned about the murmur noted on exam. No history of prior murmur and Echo in 2016 was normal. Doubt pericarditis but will check sed rat and CRP. Will check Echo today. Will discontinue heparin. 2. Murmur - newly diagnosed. Exam c/w Aortic insufficiency. With recent illness need to consider SBE but no fever and WBC is normal. Will await Echo results. 3. CAD with stent of LAD in 2016. No real indication for long term DAPT. Will discontinue Plavix. Continue ASA 4. HTN controlled on current meds. 5. HLD on statin. Good control.  Ignazio Kincaid [place], [place] 06/07/2018 8:58 AM

## 2018-06-07 NOTE — Progress Notes (Signed)
ANTICOAGULATION CONSULT NOTE - Follow Up Consult  Pharmacy Consult for heparin Indication: chest pain/ACS  Labs: Recent Labs    06/06/18 1645 06/06/18 2228 06/07/18 0149 06/07/18 0411  HGB 14.2  --   --  13.7  HCT 43.3  --   --  40.3  PLT 218  --   --  216  HEPARINUNFRC  --   --   --  0.37  CREATININE 1.10  --   --   --   TROPONINI  --  <0.03 <0.03 <0.03    Assessment/Plan:  53yo male therapeutic on heparin with initial dosing for CP. Will continue gtt at current rate and confirm stable with additional level.   Wynona Neat, PharmD, BCPS  06/07/2018,6:55 AM

## 2018-06-07 NOTE — Progress Notes (Signed)
I reviewed the Echo results. 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 Unable to tell if bicuspid valve. Recommend TEE to further evaluate. Scheduled tomorrow at 11 am. Procedure and risks explained to patient including bleeding and esophageal injury. Will be done with conscious sedation.  Isaac Breshears Martinique MD, [facility]

## 2018-06-08 ENCOUNTER — Encounter (HOSPITAL_COMMUNITY)
Admission: EM | Disposition: A | Discharge status: Home / Self Care | Payer: Self-pay | Source: Home / Self Care | Attending: Emergency Medicine

## 2018-06-08 ENCOUNTER — Encounter (HOSPITAL_COMMUNITY): Payer: Self-pay

## 2018-06-08 ENCOUNTER — Observation Stay (HOSPITAL_COMMUNITY): Payer: 59

## 2018-06-08 ENCOUNTER — Observation Stay (HOSPITAL_BASED_OUTPATIENT_CLINIC_OR_DEPARTMENT_OTHER): Payer: 59

## 2018-06-08 DIAGNOSIS — I351 Nonrheumatic aortic (valve) insufficiency: Secondary | ICD-10-CM | POA: Diagnosis not present

## 2018-06-08 DIAGNOSIS — E7800 Pure hypercholesterolemia, unspecified: Secondary | ICD-10-CM | POA: Diagnosis not present

## 2018-06-08 DIAGNOSIS — R42 Dizziness and giddiness: Secondary | ICD-10-CM

## 2018-06-08 DIAGNOSIS — R109 Unspecified abdominal pain: Secondary | ICD-10-CM | POA: Diagnosis not present

## 2018-06-08 DIAGNOSIS — G4733 Obstructive sleep apnea (adult) (pediatric): Secondary | ICD-10-CM

## 2018-06-08 DIAGNOSIS — F411 Generalized anxiety disorder: Secondary | ICD-10-CM | POA: Diagnosis not present

## 2018-06-08 DIAGNOSIS — I10 Essential (primary) hypertension: Secondary | ICD-10-CM | POA: Diagnosis not present

## 2018-06-08 DIAGNOSIS — K210 Gastro-esophageal reflux disease with esophagitis: Secondary | ICD-10-CM | POA: Diagnosis not present

## 2018-06-08 DIAGNOSIS — F419 Anxiety disorder, unspecified: Secondary | ICD-10-CM | POA: Diagnosis not present

## 2018-06-08 DIAGNOSIS — I2510 Atherosclerotic heart disease of native coronary artery without angina pectoris: Secondary | ICD-10-CM | POA: Diagnosis not present

## 2018-06-08 DIAGNOSIS — R011 Cardiac murmur, unspecified: Secondary | ICD-10-CM | POA: Diagnosis not present

## 2018-06-08 DIAGNOSIS — S58112S Complete traumatic amputation at level between elbow and wrist, left arm, sequela: Secondary | ICD-10-CM | POA: Diagnosis not present

## 2018-06-08 DIAGNOSIS — R0789 Other chest pain: Secondary | ICD-10-CM | POA: Diagnosis not present

## 2018-06-08 DIAGNOSIS — E785 Hyperlipidemia, unspecified: Secondary | ICD-10-CM | POA: Diagnosis not present

## 2018-06-08 DIAGNOSIS — R079 Chest pain, unspecified: Secondary | ICD-10-CM | POA: Diagnosis not present

## 2018-06-08 HISTORY — PX: TEE WITHOUT CARDIOVERSION: SHX5443

## 2018-06-08 LAB — CBC
HCT: 42.4 % (ref 39.0–52.0)
Hemoglobin: 14.1 g/dL (ref 13.0–17.0)
MCH: 31 pg (ref 26.0–34.0)
MCHC: 33.3 g/dL (ref 30.0–36.0)
MCV: 93.2 fL (ref 80.0–100.0)
Platelets: 208 10*3/uL (ref 150–400)
RBC: 4.55 MIL/uL (ref 4.22–5.81)
RDW: 12.2 % (ref 11.5–15.5)
WBC: 8.2 10*3/uL (ref 4.0–10.5)
nRBC: 0 % (ref 0.0–0.2)

## 2018-06-08 LAB — RPR: RPR Ser Ql: NONREACTIVE

## 2018-06-08 SURGERY — ECHOCARDIOGRAM, TRANSESOPHAGEAL
Anesthesia: Moderate Sedation

## 2018-06-08 MED ORDER — MIDAZOLAM HCL (PF) 10 MG/2ML IJ SOLN
INTRAMUSCULAR | Status: DC | PRN
  Administered 2018-06-08: 2 mg via INTRAVENOUS
  Administered 2018-06-08 (×2): 1 mg via INTRAVENOUS

## 2018-06-08 MED ORDER — MIDAZOLAM HCL (PF) 5 MG/ML IJ SOLN
INTRAMUSCULAR | Status: AC
  Filled 2018-06-08: qty 2

## 2018-06-08 MED ORDER — FENTANYL CITRATE (PF) 100 MCG/2ML IJ SOLN
INTRAMUSCULAR | Status: AC
  Filled 2018-06-08: qty 2

## 2018-06-08 MED ORDER — FENTANYL CITRATE (PF) 100 MCG/2ML IJ SOLN
INTRAMUSCULAR | Status: DC | PRN
  Administered 2018-06-08 (×2): 25 ug via INTRAVENOUS

## 2018-06-08 MED ORDER — DIPHENHYDRAMINE HCL 50 MG/ML IJ SOLN
INTRAMUSCULAR | Status: AC
  Filled 2018-06-08: qty 1

## 2018-06-08 MED ORDER — PANTOPRAZOLE SODIUM 40 MG PO TBEC: 40.0000 mg | DELAYED_RELEASE_TABLET | Freq: Two times a day (BID) | ORAL | 0 refills | Status: DC

## 2018-06-08 MED ORDER — BUTAMBEN-TETRACAINE-BENZOCAINE 2-2-14 % EX AERO
INHALATION_SPRAY | CUTANEOUS | Status: DC | PRN
  Administered 2018-06-08: 2 via TOPICAL

## 2018-06-08 NOTE — Progress Notes (Signed)
AVS instructions reviewed and given to patient and wife. Questions answered.  PIV removed. Patient discharged via wheelchair.

## 2018-06-08 NOTE — Interval H&P Note (Signed)
History and Physical Interval Note:  06/08/2018 11:21 AM  Isaac Patel  has presented today for surgery, with the diagnosis of AORTIC INSUFFICIENCY.  The various methods of treatment have been discussed with the patient and family. After consideration of risks, benefits and other options for treatment, the patient has consented to  Procedure(s): TRANSESOPHAGEAL ECHOCARDIOGRAM (TEE) (N/A) as a surgical intervention.  The patient's history has been reviewed, patient examined, no change in status, stable for surgery.  I have reviewed the patient's chart and labs.  Questions were answered to the patient's satisfaction.     Gabrielly Mccrystal Harrell Gave

## 2018-06-08 NOTE — Discharge Summary (Signed)
Isaac Patel, is a 53 y.o. male  DOB [birth year]  MRN [id number].  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 CAD (coronary artery disease)    a.  Non-STEMI 8/16: LHC-proximal LAD 80% treated with a resolute DES, EF 50-55%;  b.  Echo 8/16:  Moderate LVH, EF 55-60%, normal wall motion, normal diastolic function, normal RV function  . Depression    hx  . GERD (gastroesophageal reflux disease)   . Headache    "maybe monthly" (10/28/2014)  . HLD (hyperlipidemia)   . Hypertension   . NSTEMI (non-ST elevated myocardial infarction) ([place]) 10/28/2014  . OSA (obstructive sleep apnea)    "tried mask; wear it off and on" (10/28/2014)    Past Surgical History:  Procedure Laterality Date  . 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 CARDIAC CATHETERIZATION N/A 10/29/2014   Procedure: Left Heart Cath and Coronary Angiography;  Surgeon: Troy Sine, MD;  Location: [facility];  Service: Cardiovascular;  Laterality: N/A;  . CHOLESTEATOMA EXCISION Right 1990's  . LEFT HEART CATH AND CORONARY ANGIOGRAPHY N/A 05/25/2016   Procedure: Left Heart  Cath and Coronary Angiography;  Surgeon: Sherren Mocha, MD;  Location: [facility];  Service: Cardiovascular;  Laterality: N/A;  . NASAL SEPTOPLASTY W/ TURBINOPLASTY Bilateral 11/30/2017   Procedure: NASAL SEPTOPLASTY WITH TURBINATE REDUCTION;  Surgeon: Jerrell Belfast, MD;  Location: [facility];  Service: ENT;  Laterality: Bilateral;  . TYMPANOSTOMY TUBE PLACEMENT Bilateral "as a kid"   "had one done as an adult too"  . WISDOM TOOTH EXTRACTION         HPI  from the history and physical done on the day of admission:    Isaac Patel is a 53 y.o. male with medical history significant of hypertension, hyperlipidemia, GERD, coronary artery disease, history of NSTEMI in 2009.  49 and 2016 had stents.  Patient following up with cardiology was last visit in November 2019.  Patient presents to the ER with substernal chest pain on and off for the last 2 days.  He initially was having bloating and abdominal discomfort.  Symptoms have progressed to the left side of the chest.  More pressure.  Consistent with his previous cardiac pain.  Not relieved by his  nitro.  Came to the ER where he has had some relief but still has 2 out of 10 pain in his chest.  Associated with some mild nausea.  No diaphoresis.  Patient's initial enzyme was negative.  EKG also showed no new findings.  Due to risk factors he is being admitted for MI rule out.  Cardiology has already been contacted..  ED Course: Vitals are stable, CBC and chemistry all stable.  Initial troponin 0 0.  Chest x-ray showed no active findings.  EKG also showed no significant findings.  Patient is being admitted for MI rule out.  Cardiology consulted and the recommended of the patient and trending enzymes.     Hospital Course:    Chest pain, atypical/abdominal pain: Patient presents with complaints of continued substernal chest/epigastric pain.  EKG without significant ischemic changes and troponins have been negative. Chest x-ray was otherwise clear  of any acute abnormalities. Question if symptoms were related with recent viral illness, but CRP and ESR within normal limits making infection less likely.  Thyroid studies were noted to be within normal limits.  Dr. Martinique of cardiology consulted recommended echocardiogram, but felt chest pain symptoms less likely cardiac in nature.  Echocardiogram revealed EF 55 to 60% with moderate to severe aortic insufficiency. Check CT scan abdomen and pelvis with oral contrast showed no significant acute abnormalities except for aortic atherosclerosis. Suspect chest pain symptoms less likely cardiac in nature and could likely be related to a GI cause.  Patient was sent a prescription for Protonix twice daily.  And recommended to follow-up with GI in the outpatient setting.   Aortic insufficiency: Acute.  Patient was noted to have a significant cardiac murmur for which echocardiogram revealed moderate to severe aortic insufficiency. Cardiology recommending transesophageal echocardiogram on 3/12; which revealed severe aortic insufficiency with a tricuspid valve.  Primary care provider will need to follow-up ANA with reflex pending  Coronary artery disease, NSTEMI: Patient with previous history of stent placement to the LAD after NSTEMI in 2016. Patient reports that he still been taking aspirin and Plavix at home since that time.  Cardiology recommended discontinuation of Plavix.  Patient continued on aspirin and statin.  Essential hypertension: Blood pressures were stable during his hospital stay.  He was continued on lisinopril.  Hyperlipidemia: Last lipid panel revealed total cholesterol 124, HDL 35, LDL 58, and triglycerides 151 in 08/2017. Continued atorvastatin per home dose.  History of anxiety disorder: Patient currently not treated with any medications.  GERD -Continue PPI as seen above -May benefit from GI referral in outpatient setting if symptoms persist  Family history of factor H deficiency:  Patient's father reportedly has factor H deficiency. -May warrant further investigation of factor H deficiency in outpatient setting  Tobacco abuse: Patient still reports smoking cigarettes. Counseled on the need of cessation of tobacco use.  Amputation below elbow of left arm  Body mass index is 29.4 kg/m.    Follow UP  Follow-up Information    Liliane Shi, PA-C. Go on 06/16/2018.   Specialties:  Cardiology, Physician Assistant Why:  Appointment is scheduled for 11:15 AM.  Please call and verify appointment, location, and time. Contact information: [address] [address] [phone]            Consults obtained: Dr. Peter Martinique cardiology  Discharge Condition: Stable  Diet and Activity recommendation: See Discharge Instructions below  Discharge Instructions    Diet - low sodium heart healthy   Complete  by:  As directed    Discharge instructions   Complete by:  As directed    Follow with Primary MD [name], NP within 1 to 2 weeks from discharge from the hospital.  You will need to call and schedule this appointment.  Follow-up appointments with cardiology have already been made and as seen below.  There was no clear cause of your pain symptoms that was found during your hospital stay.  Information regarding aortic insufficiency is provided.  Cardiology recommended that you stop Plavix and continue taking aspirin.  Consider a trial of Protonix twice daily to see if it helps with epigastric discomfort.  Prescriptions have been sent to your pharmacy.  Also recommend following up with gastroenterology for further work-up.  -  checked  by Primary MD or SNF MD in 5-7 days ( we routinely change or add medications that can affect your baseline labs and fluid status, therefore we recommend that you get the mentioned basic workup next visit with your PCP, your PCP may decide not to get them or add new tests based on their clinical  decision)  Activity: As tolerated  Disposition: Home   Diet: Heart Healthy   Special Instructions: If you have smoked or chewed Tobacco  in the last 2 yrs please stop smoking, stop any regular Alcohol  and or any Recreational drug use.  On your next visit with your primary care physician please Get Medicines reviewed and adjusted.  Please request your [name], NP to go over all Hospital Tests and Procedure/Radiological results at the follow up, please get all Hospital records sent to your Prim MD by signing hospital release before you go home.  If you experience worsening of your admission symptoms, develop shortness of breath, life threatening emergency, suicidal or homicidal thoughts you must seek medical attention immediately by calling 911 or calling your MD immediately  if symptoms less severe.  You Must read complete instructions/literature along with all the possible adverse reactions/side effects for all the Medicines you take and that have been prescribed to you. Take any new Medicines after you have completely understood and accpet all the possible adverse reactions/side effects.   Do not drive, operate heavy machinery, perform activities at heights, swimming or participation in water activities or provide baby sitting services if your were admitted for syncope or siezures until you have seen by Primary MD or a Neurologist and advised to do so again.  Do not drive when taking Pain medications.  Do not take more than prescribed Pain, Sleep and Anxiety Medications  Wear Seat belts while driving.   Please note  You were cared for by a hospitalist during your hospital stay. If you have any questions about your discharge medications or the care you received while you were in the hospital after you are discharged, you can call the unit and asked to speak with the hospitalist on call if the hospitalist that took care of you is not available. Once you are discharged, your  primary care physician will handle any further medical issues. Please note that NO REFILLS for any discharge medications will be authorized once you are discharged, as it is imperative that you return to your primary care physician (or establish a relationship with a primary care physician if you do not have one) for your aftercare needs so that they can reassess your need for medications and monitor your lab values.   Increase activity slowly   Complete by:  As directed  Discharge Medications     Allergies as of 06/08/2018      Reactions   Morphine And Related Itching      Medication List    STOP taking these medications   clopidogrel 75 MG tablet Commonly known as:  PLAVIX     TAKE these medications   aspirin EC 81 MG tablet Take 81 mg by mouth daily.   atorvastatin 80 MG tablet Commonly known as:  LIPITOR Take 1 tablet (80 mg total) by mouth daily at 6 PM.   cyclobenzaprine 5 MG tablet Commonly known as:  FLEXERIL Take 1 tablet (5 mg total) by mouth 3 (three) times daily as needed for muscle spasms.   diclofenac sodium 1 % Gel Commonly known as:  VOLTAREN Apply 2 g topically 4 (four) times daily. What changed:    when to take this  reasons to take this   dicyclomine 10 MG capsule Commonly known as:  BENTYL Take 1 capsule (10 mg total) by mouth 4 (four) times daily as needed for spasms. For abdominal pain and bloating   gabapentin 100 MG capsule Commonly known as:  NEURONTIN Take 2 capsules (200 mg total) by mouth at bedtime.   lisinopril 20 MG tablet Commonly known as:  PRINIVIL,ZESTRIL Take 1 tablet (20 mg total) by mouth daily. Please keep upcoming appt in November for future refills. Thank you What changed:  additional instructions   loratadine 10 MG tablet Commonly known as:  CLARITIN Take 10 mg by mouth daily as needed for allergies.   MULTI-VITAMIN PO Take 1 tablet by mouth daily.   nitroGLYCERIN 0.4 MG SL tablet Commonly known as:   NITROSTAT Place 1 tablet (0.4 mg total) under the tongue every 5 (five) minutes x 3 doses as needed for chest pain.   pantoprazole 40 MG tablet Commonly known as:  PROTONIX Take 1 tablet (40 mg total) by mouth 2 (two) times daily. What changed:  when to take this   tamsulosin 0.4 MG Caps capsule Commonly known as:  FLOMAX Take 1 capsule (0.4 mg total) by mouth daily.       Major procedures and Radiology Reports - PLEASE review detailed and final reports for all details, in brief -   Transesophageal echocardiogram 06/08/2018 -Noted to show severe aortic stenosis read pending  Echocardiogram 06/07/2018: Impression 1. The left ventricle has normal systolic function, with an ejection fraction of 55-60%. The cavity size was moderately dilated. Left ventricular diastolic Doppler parameters are indeterminate. 2. The right ventricle has normal systolic function. The cavity was normal. There is no increase in right ventricular wall thickness. Right ventricular systolic pressure is mildly elevated with an estimated pressure of 42.2 mmHg. 3. Mild thickening of the mitral valve leaflet. Mild calcification of the mitral valve leaflet. 4. The aortic valve is abnormal Mild thickening of the aortic valve Mild calcification of the aortic valve. Aortic valve regurgitation is moderate to severe by color flow Doppler. 5. Moderate (based on pressure half time of 251 ms) to severe (based on vena contracta of 0.93 cm) aortic regurgitation. Cannot definitively determine if valve is bicuspid or tricuspid. 6. The inferior vena cava wasdilated in size with <50% respiratory variability.  Ct Abdomen Pelvis Wo Contrast  Result Date: 06/08/2018 CLINICAL DATA:  Abdominal pain EXAM: CT ABDOMEN AND PELVIS WITHOUT CONTRAST TECHNIQUE: Multidetector CT imaging of the abdomen and pelvis was performed following the standard protocol without IV contrast. COMPARISON:  02/01/2018 FINDINGS: LOWER CHEST: There is no basilar  pleural or apical  pericardial effusion. HEPATOBILIARY: The hepatic contours and density are normal. There is no intra- or extrahepatic biliary dilatation. The gallbladder is normal. PANCREAS: The pancreatic parenchymal contours are normal and there is no ductal dilatation. There is no peripancreatic fluid collection. SPLEEN: Normal. ADRENALS/URINARY TRACT: --Adrenal glands: Normal. --Right kidney/ureter: No hydronephrosis, nephroureterolithiasis, perinephric stranding or solid renal mass. --Left kidney/ureter: No hydronephrosis, nephroureterolithiasis, perinephric stranding or solid renal mass. --Urinary bladder: Normal for degree of distention STOMACH/BOWEL: --Stomach/Duodenum: There is no hiatal hernia or other gastric abnormality. The duodenal course and caliber are normal. --Small bowel: No dilatation or inflammation. --Colon: No focal abnormality. --Appendix: Normal. VASCULAR/LYMPHATIC: There is aortic atherosclerosis without hemodynamically significant stenosis. No abdominal or pelvic lymphadenopathy. REPRODUCTIVE: Normal prostate size with symmetric seminal vesicles. MUSCULOSKELETAL. No bony spinal canal stenosis or focal osseous abnormality. OTHER: None. IMPRESSION: No acute abnormality of the abdomen or pelvis. Electronically Signed   By: Ulyses Jarred M.D.   On: 06/08/2018 16:47   Dg Chest 2 View  Result Date: 06/06/2018 CLINICAL DATA:  Mid chest pain. EXAM: CHEST - 2 VIEW COMPARISON:  None. FINDINGS: The heart size and mediastinal contours are within normal limits. Both lungs are clear. The visualized skeletal structures are unremarkable. IMPRESSION: No active cardiopulmonary disease. Electronically Signed   By: Dorise Bullion III M.D   On: 06/06/2018 17:26    Micro Results    No results found for this or any previous visit (from the past 240 hour(s)).     Today   Subjective    Isaac Patel  reports that he still has substernal discomfort that he describes this more so epigastric  in nature.  He is on acid reflux medicine of Protonix once daily.    Objective   Blood pressure 112/67, pulse 63, temperature 97.6 F (36.4 C), temperature source Oral, resp. rate 18, height 5' 11"  (1.803 m), weight 95.6 kg, SpO2 95 %.   Intake/Output Summary (Last 24 hours) at 06/08/2018 1727 Last data filed at 06/08/2018 1500 Gross per 24 hour  Intake 921.61 ml  Output -  Net 921.61 ml    Exam  Constitutional: Middle-age male NAD, calm, comfortable Eyes: PERRL, lids and conjunctivae normal ENMT: Mucous membranes are moist. Posterior pharynx clear of any exudate or lesions.Normal dentition.  Neck: normal, supple, no masses, no thyromegaly Respiratory: clear to auscultation bilaterally, no wheezing, no crackles. Normal respiratory effort. No accessory muscle use.  Cardiovascular: Holosystolic aortic murmur 3 out of 6.  No significant lower extremity edema  Abdomen: no tenderness, no masses palpated. No hepatosplenomegaly. Bowel sounds positive.  Musculoskeletal: no clubbing / cyanosis.  Below elbow amputation of left arm. Skin: no rashes, lesions, ulcers. No induration Neurologic: CN 2-12 grossly intact. Sensation intact, DTR normal. Strength 5/5 in all 4.  Psychiatric: Normal judgment and insight. Alert and oriented x 3. Normal mood.    Data Review   CBC w Diff:  Lab Results  Component Value Date   WBC 8.2 06/08/2018   HGB 14.1 06/08/2018   HCT 42.4 06/08/2018   PLT 208 06/08/2018   LYMPHOPCT 27.0 11/12/2014   MONOPCT 9.3 11/12/2014   EOSPCT 6.6 (H) 11/12/2014   BASOPCT 0.6 11/12/2014    CMP:  Lab Results  Component Value Date   NA 137 06/06/2018   K 4.0 06/06/2018   CL 107 06/06/2018   CO2 23 06/06/2018   BUN 21 (H) 06/06/2018   CREATININE 1.10 06/06/2018   PROT 6.7 05/10/2018   ALBUMIN 4.2 05/10/2018   BILITOT 0.6  05/10/2018   ALKPHOS 70 05/10/2018   AST 15 05/10/2018   ALT 16 05/10/2018  .   Total Time in preparing paper work, data evaluation and  todays exam - 35 minutes  Norval Morton M.D on 06/08/2018 at [facility]   Office  [phone]

## 2018-06-08 NOTE — Progress Notes (Addendum)
Report received,  Severe aortic stenosis per TEE procedure.

## 2018-06-08 NOTE — Progress Notes (Signed)
Progress Note  Patient Name: Isaac Patel Date of Encounter: 06/08/2018  Primary Cardiologist: [name], MD   Subjective   Still having lower sternal and epigastric pain. Constant. No dyspnea.   Inpatient Medications    Scheduled Meds: . aspirin EC  81 mg Oral Daily  . atorvastatin  80 mg Oral q1800  . enoxaparin (LOVENOX) injection  40 mg Subcutaneous Q24H  . gabapentin  200 mg Oral QHS  . lisinopril  10 mg Oral Daily  . pantoprazole  40 mg Oral Daily  . sodium chloride flush  3 mL Intravenous Once   Continuous Infusions: . sodium chloride 20 mL/hr at 06/07/18 2123   PRN Meds: acetaminophen, alum & mag hydroxide-simeth, cyclobenzaprine, dicyclomine, ondansetron (ZOFRAN) IV   Vital Signs    Vitals:   06/07/18 1102 06/07/18 1752 06/07/18 2326 06/08/18 0500  BP: (!) 112/56 (!) 106/50 (!) 112/57 120/75  Pulse: 70 76 68 69  Resp: 19 16 18 18   Temp: 98.1 F (36.7 C) 98 F (36.7 C) 98.6 F (37 C) 97.9 F (36.6 C)  TempSrc: Oral Oral Oral Oral  SpO2: 93% 94% 95% 96%  Weight:      Height:        Intake/Output Summary (Last 24 hours) at 06/08/2018 0846 Last data filed at 06/08/2018 0600 Gross per 24 hour  Intake 1004.8 ml  Output -  Net 1004.8 ml   Last 3 Weights 06/06/2018 06/06/2018 05/31/2018  Weight (lbs) 210 lb 12.2 oz 205 lb 14.6 oz 206 lb  Weight (kg) 95.6 kg 93.4 kg 93.441 kg      Telemetry    NSR with PVCs - Personally Reviewed  ECG    None today. - Personally Reviewed  Physical Exam   GEN: No acute distress.   Neck: No JVD Cardiac: RRR, gr 2-3/6 high pitched blowing diastolic murmur RUSB>> apex. No  rubs, or gallops.  Respiratory: Clear to auscultation bilaterally. 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; No deformity. Neuro:  Nonfocal  Psych: Normal affect   Labs    Chemistry Recent Labs  Lab 06/06/18 1645  NA 137  K 4.0  CL 107  CO2 23  GLUCOSE 128*  BUN 21*  CREATININE 1.10  CALCIUM 8.7*  GFRNONAA >60  GFRAA  >60  ANIONGAP 7     Hematology Recent Labs  Lab 06/06/18 1645 06/07/18 0411 06/08/18 0531  WBC 8.0 8.2 8.2  RBC 4.59 4.34 4.55  HGB 14.2 13.7 14.1  HCT 43.3 40.3 42.4  MCV 94.3 92.9 93.2  MCH 30.9 31.6 31.0  MCHC 32.8 34.0 33.3  RDW 12.3 12.3 12.2  PLT 218 216 208    Cardiac Enzymes Recent Labs  Lab 06/06/18 2228 06/07/18 0149 06/07/18 0411  TROPONINI <0.03 <0.03 <0.03    Recent Labs  Lab 06/06/18 1654 06/06/18 2044  TROPIPOC 0.00 0.01     BNPNo results for input(s): BNP, PROBNP in the last 168 hours.   DDimer No results for input(s): DDIMER in the last 168 hours.   Radiology    Dg Chest 2 View  Result Date: 06/06/2018 CLINICAL DATA:  Mid chest pain. EXAM: CHEST - 2 VIEW COMPARISON:  None. FINDINGS: The heart size and mediastinal contours are within normal limits. Both lungs are clear. The visualized skeletal structures are unremarkable. IMPRESSION: No active cardiopulmonary disease. Electronically Signed   By: Dorise Bullion III M.D   On: 06/06/2018 17:26    Cardiac Studies   Echo: IMPRESSIONS  1. The  left ventricle has normal systolic function, with an ejection fraction of 55-60%. The cavity size was moderately dilated. Left ventricular diastolic Doppler parameters are indeterminate.  2. The right ventricle has normal systolic function. The cavity was normal. There is no increase in right ventricular wall thickness. Right ventricular systolic pressure is mildly elevated with an estimated pressure of 42.2 mmHg.  3. Mild thickening of the mitral valve leaflet. Mild calcification of the mitral valve leaflet.  4. The aortic valve is abnormal Mild thickening of the aortic valve Mild calcification of the aortic valve. Aortic valve regurgitation is moderate to severe by color flow Doppler.  5. Moderate (based on pressure half time of 251 ms) to severe (based on vena contracta of 0.93 cm) aortic regurgitation. Cannot definitively determine if valve is bicuspid or  tricuspid.  6. The inferior vena cava was dilated in size with <50% respiratory variability   Patient Profile     53 y.o. male with history of CAD s/p LAD stent in 2016 is seen for evaluation of chest pain and new murmur.  Assessment & Plan    1. Aortic insufficiency. Moderate to severe by Echo with very eccentric jet. Possible bicuspid valve. LV is dilated. May need to be considered for AVR at some point. Plan TEE today to evaluate anatomy and mechanism. On lisinopril. I would anticipate he could be DC after TEE with close cardiology follow up as outpatient. Will await TEE results.  2. Atypical chest pain. No evidence of ACS, dissection, pericarditis. For CT chest today per primary team 3. CAD s/p stent of LAD in 2016. Cath in 2018 showed stent was patent. On ASA. Plavix discontinued. 4. HTN well controlled.  5. HLD well controlled on statin.       For questions or updates, please contact [place] Please consult [email or web address] for contact info under        Signed,  [place], MD  06/08/2018, 8:46 AM

## 2018-06-08 NOTE — CV Procedure (Signed)
    TRANSESOPHAGEAL ECHOCARDIOGRAM   NAME:  Isaac Patel   MRN: [id number] DOB:  1965-12-17   ADMIT DATE: 06/06/2018  INDICATIONS:   PROCEDURE:   Informed consent was obtained prior to the procedure. The risks, benefits and alternatives for the procedure were discussed and the patient comprehended these risks.  Risks include, but are not limited to, cough, sore throat, vomiting, nausea, somnolence, esophageal and stomach trauma or perforation, bleeding, low blood pressure, aspiration, pneumonia, infection, trauma to the teeth and death.    Procedural time out performed. The oropharynx was anesthetized with topical 1% lidocaine.    During this procedure the patient is administered a total of Versed 4 mg and Fentanyl 50 mcg to achieve and maintain moderate conscious sedation.  The patient's heart rate, blood pressure, and oxygen saturation are monitored continuously during the procedure. The period of conscious sedation is 27 minutes, of which I was present face-to-face 100% of this time.   The transesophageal probe was inserted in the esophagus and stomach without difficulty and multiple views were obtained.    COMPLICATIONS:    There were no immediate complications.  FINDINGS:  LEFT VENTRICLE: EF = 50-55%. No regional wall motion abnormalities. Moderately dilated LV visually, in line with measurements on TTE yesterday.   RIGHT VENTRICLE: Normal size and function.   LEFT ATRIUM: No thrombus/mass.  LEFT ATRIAL APPENDAGE: No thrombus/mass.   RIGHT ATRIUM: No thrombus/mass.  AORTIC VALVE:  Trileaflet. Severe regurgitation. No vegetation.  MITRAL VALVE:    Normal structure. Trivial regurgitation. No vegetation.  TRICUSPID VALVE: Normal structure. No regurgitation. No vegetation.  PULMONIC VALVE: Grossly normal structure. Trivial regurgitation. No apparent vegetation.  INTERATRIAL SEPTUM: No PFO or ASD seen by color Doppler.  PERICARDIUM: No effusion noted.  DESCENDING  AORTA: Mild diffuse plaque seen   CONCLUSION: Eccentric AR jet consistent with severe aortic regurgitation. 3D images and color flow attempted to further evaluate. No windsock or clear evidence of Sinus of Valsalva rupture, and no flow seen on RA/RV views to suggest SoV rupture or VSD as cause. Jet difficult to measure given acute angle from cusp closure. Vena contracta estimated at at least 0.8 cm. Appears that there may be a prolapsed cusp as the etiology of the AI.  Patient was very sensitive to movement of the probe. He did not tolerate transgastric views. Additional views attempted for further assessment of the aortic valve, but he began coughing/gagging, with sinus tachycardia in the 150s. Blood pressure at the time was 101/39, and given wide pulse pressure, decision was made to end the case instead of administering additional sedation.  TTE and TEE evidence together suggests severe eccentric AR, possibly due to prolapsed coronary cusp, with moderately dilated LV and borderline EF. Meets criteria for aortic valve replacement.  Buford Dresser, MD, PhD [facility]  [address] [place], [place] [address] [phone]   11:58 AM

## 2018-06-08 NOTE — Progress Notes (Signed)
Discussed results of TEE with patient and wife. 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 LV is moderately dilated. He meets criteria for AVR. Recommend DC after CT done if no significant pathology. Will schedule return visit in our office. Next steps would be to arrange right and left heart cath as outpatient and refer to CT surgery for AVR.   Delania Ferg Martinique MD, [facility]

## 2018-06-08 NOTE — Progress Notes (Signed)
  Echocardiogram 2D Echocardiogram has been performed.  Isaac Patel 06/08/2018, 12:08 PM

## 2018-06-09 ENCOUNTER — Encounter: Payer: Self-pay | Admitting: Physician Assistant

## 2018-06-09 ENCOUNTER — Encounter: Payer: Self-pay | Admitting: Nurse Practitioner

## 2018-06-09 LAB — ANA W/REFLEX IF POSITIVE: ANA: NEGATIVE

## 2018-06-09 NOTE — Telephone Encounter (Signed)
Letter to return to work in patient's chart. Please fax to [phone] Zorita Pang). Richardson Dopp, PA-C    06/09/2018 1:32 PM

## 2018-06-10 ENCOUNTER — Encounter (HOSPITAL_COMMUNITY): Payer: Self-pay | Admitting: Cardiology

## 2018-06-14 ENCOUNTER — Encounter: Payer: Self-pay | Admitting: Physician Assistant

## 2018-06-16 ENCOUNTER — Ambulatory Visit: Payer: 59 | Admitting: Physician Assistant

## 2018-06-16 ENCOUNTER — Other Ambulatory Visit: Payer: Self-pay

## 2018-06-16 ENCOUNTER — Encounter: Payer: Self-pay | Admitting: Physician Assistant

## 2018-06-16 VITALS — BP 120/52 | HR 89 | Ht 71.0 in | Wt 209.0 lb

## 2018-06-16 DIAGNOSIS — I351 Nonrheumatic aortic (valve) insufficiency: Secondary | ICD-10-CM

## 2018-06-16 DIAGNOSIS — I10 Essential (primary) hypertension: Secondary | ICD-10-CM

## 2018-06-16 DIAGNOSIS — I2510 Atherosclerotic heart disease of native coronary artery without angina pectoris: Secondary | ICD-10-CM

## 2018-06-16 NOTE — Progress Notes (Signed)
Cardiology Office Note:    Date:  06/16/2018   ID:  Isaac Patel, DOB 02-28-1966, MRN [id number]  PCP:  [name], NP  Cardiologist:  [name], MD   Electrophysiologist:  None   Referring MD: [name], NP   Chief Complaint  Patient presents with  . Hospitalization Follow-up    aortic regurgitation     History of Present Illness:    Isaac Patel is a 53 y.o. male with CAD s/p NSTEMI in 8/16 tx with DES to the proximal LAD. Cardiac Catheterization in 04/2016 demonstrated a patent LAD stent.  He was last seen in November 2019.  He was admitted 3/10-3/12 with chest and abdominal pain.  He ruled out for ACS.  He was seen by Dr. Martinique.  He was noted to have murmur on exam and an echocardiogram demonstrated moderate to severe aortic insufficiency.  Transesophageal echocardiogram confirmed severe aortic insufficiency.  His LV is dilated and he was felt to meet criteria for aortic valve replacement.  The plan was to proceed with further work-up as an outpatient.   Isaac Patel returns for follow up. He is here with his wife.  Since DC, he has been doing ok.  He does feel like he has had less energy over the past several months. He has some minimal shortness of breath.  He has not had exertional chest pain.  He has not had paroxysmal nocturnal dyspnea, leg swelling or syncope.  He had URI symptoms a few weeks ago.    Prior CV studies:   The following studies were reviewed today:  Transesophageal echocardiogram 06/08/2018 EF 50-55, moderately dilated, normal RV SF, severe AI, mild plaque descending aorta  Echocardiogram 06/07/2018 EF 55-60, moderately dilated, normal RV SF, mild MAC, moderate to severe AI  LHC 05/25/16 LAD prox stent ok LCx irregs RCA normal EF 55  Echo 10/30/14 Moderate LVH, EF 55-60%, normal wall motion, normal diastolic function, normal RV function  LHC 10/29/14 LAD: Proximal-mid 80% EF 50-55% with small focal region of mild mid  anterolateral hypokinesis PCI: 3 x 15 mm resolute DES to the LAD  Past Medical History:  Diagnosis Date  . CAD (coronary artery disease)    a.  Non-STEMI 8/16: LHC-proximal LAD 80% treated with a resolute DES, EF 50-55%;  b.  Echo 8/16:  Moderate LVH, EF 55-60%, normal wall motion, normal diastolic function, normal RV function  . Depression    hx  . GERD (gastroesophageal reflux disease)   . Headache    "maybe monthly" (10/28/2014)  . HLD (hyperlipidemia)   . Hypertension   . NSTEMI (non-ST elevated myocardial infarction) ([place]) 10/28/2014  . OSA (obstructive sleep apnea)    "tried mask; wear it off and on" (10/28/2014)   Surgical Hx: The patient  has a past surgical history that includes Arm amputation through forearm (Left, 2009); Tympanostomy tube placement (Bilateral, "as a kid"); Cholesteatoma excision (Right, 1990's); Cardiac catheterization (N/A, 10/29/2014); LEFT HEART CATH AND CORONARY ANGIOGRAPHY (N/A, 05/25/2016); Wisdom tooth extraction; Nasal septoplasty w/ turbinoplasty (Bilateral, 11/30/2017); and TEE without cardioversion (N/A, 06/08/2018).   Current Medications: Current Meds  Medication Sig  . aspirin EC 81 MG tablet Take 81 mg by mouth daily.  Marland Kitchen atorvastatin (LIPITOR) 80 MG tablet Take 1 tablet (80 mg total) by mouth daily at 6 PM.  . cyclobenzaprine (FLEXERIL) 5 MG tablet Take 1 tablet (5 mg total) by mouth 3 (three) times daily as needed for muscle spasms.  . diclofenac sodium (VOLTAREN) 1 %  GEL Apply 2 g topically 4 (four) times daily as needed.  . dicyclomine (BENTYL) 10 MG capsule Take 1 capsule (10 mg total) by mouth 4 (four) times daily as needed for spasms. For abdominal pain and bloating  . gabapentin (NEURONTIN) 100 MG capsule Take 2 capsules (200 mg total) by mouth at bedtime.  Marland Kitchen lisinopril (PRINIVIL,ZESTRIL) 20 MG tablet Take 20 mg by mouth daily.  Marland Kitchen loratadine (CLARITIN) 10 MG tablet Take 10 mg by mouth daily as needed for allergies.   . Multiple Vitamin  (MULTI-VITAMIN PO) Take 1 tablet by mouth daily.  . nitroGLYCERIN (NITROSTAT) 0.4 MG SL tablet Place 1 tablet (0.4 mg total) under the tongue every 5 (five) minutes x 3 doses as needed for chest pain.  . pantoprazole (PROTONIX) 40 MG tablet Take 1 tablet (40 mg total) by mouth 2 (two) times daily.     Allergies:   Morphine and related   Social History   Tobacco Use  . Smoking status: Current Some Day Smoker    Packs/day: 0.33    Years: 32.00    Pack years: 10.56    Types: Cigarettes  . Smokeless tobacco: Never Used  Substance Use Topics  . Alcohol use: No    Alcohol/week: 2.0 - 3.0 standard drinks    Types: 2 - 3 Standard drinks or equivalent per week    Frequency: Never    Comment: stopped 06/19/2015,   . Drug use: Yes    Types: Marijuana    Comment: 05/24/2016  "stopped marijuana in the early 2000's"     Family Hx: The patient's family history includes Bradycardia in his father; Diverticulitis in his mother; Healthy in his father; Multiple sclerosis in his maternal grandfather; Skin cancer in his mother. There is no history of Colon cancer, Stomach cancer, Rectal cancer, Liver cancer, or Esophageal cancer.  ROS:   Please see the history of present illness.    ROS All other systems reviewed and are negative.   EKGs/Labs/Other Test Reviewed:    EKG:  EKG is  ordered today.  The ekg ordered today demonstrates normal sinus rhythm, HR 89, PVCs, PACs, QTc 481, no change from prior ECG.  Recent Labs: 09/21/2017: Magnesium 2.2 05/10/2018: ALT 16 06/06/2018: BUN 21; Creatinine, Ser 1.10; Potassium 4.0; Sodium 137 06/07/2018: TSH 1.414 06/08/2018: Hemoglobin 14.1; Platelets 208   Recent Lipid Panel Lab Results  Component Value Date/Time   CHOL 124 09/21/2017 10:05 AM   TRIG 151.0 (H) 09/21/2017 10:05 AM   HDL 35.90 (L) 09/21/2017 10:05 AM   CHOLHDL 3 09/21/2017 10:05 AM   LDLCALC 58 09/21/2017 10:05 AM    Physical Exam:    VS:  BP (!) 120/52   Pulse 89   Ht _0  (1.803  m)   Wt 209 lb (94.8 kg)   SpO2 97%   BMI 29.15 kg/m     Wt Readings from Last 3 Encounters:  06/16/18 209 lb (94.8 kg)  06/06/18 210 lb 12.2 oz (95.6 kg)  05/31/18 206 lb (93.4 kg)     Physical Exam  Constitutional: He is oriented to person, place, and time. He appears well-developed and well-nourished. No distress.  HENT:  Head: Normocephalic and atraumatic.  Eyes: No scleral icterus.  Neck: No JVD present. No thyromegaly present.  Cardiovascular: Normal rate. An irregular rhythm present.  Murmur heard. High-pitched blowing decrescendo early diastolic murmur is present with a grade of 4/6. Pulmonary/Chest: Effort normal. He has no rales.  Abdominal: Soft.  Musculoskeletal:  General: No edema.  Lymphadenopathy:    He has no cervical adenopathy.  Neurological: He is alert and oriented to person, place, and time.  Skin: Skin is warm and dry.  Psychiatric: He has a normal mood and affect.    ASSESSMENT & PLAN:    Nonrheumatic aortic valve insufficiency He was recently admitted for chest pain and noted to have mod to severe AI on TTE.  TEE confirmed severe AI with prolapsed leaflet.  He is minimally symptomatic.  I reviewed his case by phone with Dr. Martinique who took care of him in the hospital.  Currently, there is a moratorium on elective procedures at the hospital due to the ongoing COVID-19 pandemic.  Therefore, we will continue current medical therapy.  I reviewed the symptoms he should look out for and when to call us for sooner follow up.  Otherwise, we will see him again in 1 month (live vs virtual visit) to reassess his symptoms and better plan for further workup regarding AVR (ie - Cardiac Catheterization and referral to TCTS).  Coronary artery disease involving native coronary artery of native heart without angina pectoris S/p NSTEMI in 2016 tx with DES to the LAD.  Cardiac Catheterization in 2018 with patent LAD stent.  Continue ASA, statin.   Essential  hypertension  The patient's blood pressure is controlled on his current regimen.  Continue current therapy.     Dispo:  Return in about 4 weeks (around 07/14/2018) for Routine Follow Up w/ Dr. Radford Pax, or Richardson Dopp, PA-C.   Medication Adjustments/Labs and Tests Ordered: Current medicines are reviewed at length with the patient today.  Concerns regarding medicines are outlined above.  Tests Ordered: Orders Placed This Encounter  Procedures  . EKG 12-Lead   Medication Changes: No orders of the defined types were placed in this encounter.   Signed, Richardson Dopp, PA-C  06/16/2018 2:19 PM    [facility] [place], [place], [place]  [address] Phone: [phone]; Fax: [phone]

## 2018-06-16 NOTE — Patient Instructions (Addendum)
Medication Instructions:   Your physician recommends that you continue on your current medications as directed. Please refer to the Current Medication list given to you today.   If you need a refill on your cardiac medications before your next appointment, please call your pharmacy.   Lab work: NONE ORDERED  TODAY   If you have labs (blood work) drawn today and your tests are completely normal, you will receive your results only by: Marland Kitchen MyChart Message (if you have MyChart) OR . A paper copy in the mail If you have any lab test that is abnormal or we need to change your treatment, we will call you to review the results.  Testing/Procedures: NONE ORDERED  TODAY   Follow-Up: IN ONE MONTH WITH WEAVER ( SCHEDULE UNTIL FURTHER NOTICE PER SCOTT )    Any Other Special Instructions Will Be Listed Below (If Applicable).  If you have any of these symptoms, call us right away: Shortness of breath with minimal activity Chest pain with activity Waking up suddenly with shortness of breath  Unable to lay flat due to shortness of breath  Passing out Legs swelling

## 2018-06-21 DIAGNOSIS — Z89212 Acquired absence of left upper limb below elbow: Secondary | ICD-10-CM | POA: Diagnosis not present

## 2018-06-27 MED ORDER — LISINOPRIL 20 MG PO TABS: 20.0000 mg | ORAL_TABLET | Freq: Every day | ORAL | 3 refills | Status: DC

## 2018-06-27 NOTE — Telephone Encounter (Signed)
Pt's medication was sent to pt's pharmacy as requested. Confirmation received.  °

## 2018-07-13 ENCOUNTER — Encounter: Payer: Self-pay | Admitting: Nurse Practitioner

## 2018-07-14 ENCOUNTER — Other Ambulatory Visit: Payer: Self-pay | Admitting: Family

## 2018-07-14 MED ORDER — PANTOPRAZOLE SODIUM 40 MG PO TBEC: 40.0000 mg | DELAYED_RELEASE_TABLET | Freq: Two times a day (BID) | ORAL | 0 refills | Status: DC

## 2018-07-20 ENCOUNTER — Telehealth: Payer: Self-pay | Admitting: Physician Assistant

## 2018-07-20 NOTE — Telephone Encounter (Signed)
°  4/23 :   Called patient. Spoke with wife (on [place]) regarding visit on 4/28. Patient's wife will have patient return my call.   Sent consent via MyChart  -  07/20/2018.

## 2018-07-21 NOTE — Telephone Encounter (Signed)
This patient has severe AI and needs workup for AVR.  He was seen last month before our office reduced ambulatory visits.  He has been doing well without significant symptoms.  He did not feel that he needed to see me again next week.  So, I am moving him to Dr. Theodosia Blender schedule 6/5.  She is DOD that day.  Therefore, if needed, he could be seen in person.  Otherwise, as long as we are doing some routine testing again at that point, she can arrange for his heart cath, referral to TCTS, etc via telemedicine.  Richardson Dopp, PA-C    07/21/2018 11:33 AM

## 2018-07-25 ENCOUNTER — Ambulatory Visit: Payer: 59 | Admitting: Physician Assistant

## 2018-08-03 ENCOUNTER — Telehealth: Payer: Self-pay | Admitting: *Deleted

## 2018-08-03 NOTE — Telephone Encounter (Signed)
   Cardiac Questionnaire:    Since your last visit or hospitalization:    1. Have you been having new or worsening chest pain? yes   2. Have you been having new or worsening shortness of breath? yes 3. Have you been having new or worsening leg swelling, wt gain, or increase in abdominal girth (pants fitting more tightly)? yes   4. Have you had any passing out spells? no    See my chart message for more information.   *A YES to any of these questions would result in the appointment being kept. *If all the answers to these questions are NO, we should indicate that given the current situation regarding the worldwide coronarvirus pandemic, at the recommendation of the CDC, we are looking to limit gatherings in our waiting area, and thus will reschedule their appointment beyond four weeks from today.   _____________   [id number] Pre-Screening Questions:  . Do you currently have a fever? No . Have you recently travelled on a cruise, internationally, or to [place], [place], [place], [place], [place], or [place], [place] [employer]) ? No(yes = cancel, stay home, monitor symptoms, and contact pcp or initiate e-visit if symptoms develop) . Have you been in contact with someone that is currently pending confirmation of Covid19 testing or has been confirmed to have the [place] virus?  Yes. See note below. .  yes = cancel, stay home, away from tested individual, monitor symptoms, and contact pcp or initiate e-visit if symptoms develop) Are you currently experiencing fatigue or cough? yes(yes = pt should be prepared to have a mask placed at the time of their visit). Pt has fatigue.  No cough. He is aware to wear mask to appointment.  Pt states he is not having any covid 19 symptoms.  Pt reports his wife has fever of 99.3.  She is going for covid 19 testing today at [facility].  Will review with Isaac Dopp, PA as this appointment was just scheduled due to worsening of symptoms.

## 2018-08-03 NOTE — Telephone Encounter (Signed)
Reviewed with Richardson Dopp, PA and pt should not come in for office visit tomorrow.    Nicki Reaper can see him for telehealth visit at 8:45.  Per Nicki Reaper if pt is not comfortable with waiting until  tomorrow for telehealth visit he should go to ED. I spoke with pt and gave him information from [place], [place].  Pt is agreeable with telehealth visit tomorrow. Pt has BP cuff and will check BP and heart rate tomorrow prior to visit. He is aware to have medications or list available. Will send consent through my chart.

## 2018-08-03 NOTE — Telephone Encounter (Addendum)
Spoke with the pt re: his MY Chart message that sounded rather anxious... he works downstairs our office in Physical Med... he did some yard work on Sunday building a garden box with his sone and he got so fatigued he had to stop and has not felt well since... he has been having abdominal bloating, chest pressure in his sternal area, and a "choking" sensation like he cannot catch his breath.. he is very anxious on the phone.. he is at work today. He felt lightheaded when leaving work yesterday. Per previous encounters with Richardson Dopp PA he did not just want to go to th ER... he says he has had these symptoms before when he has gone to the ER... he is requesting to see someone physically much sooner than his 09/01/18 appt with Dr. Radford Pax.   He says this time of symptoms is lasting mush longer than usual and he thinks it could be a result of over doing it physically on Sunday but is not getting better.   After talking with Richardson Dopp PA pt needs appt with the DOD for potential Cath and then Surgical appt for AI surgery.  Pt to see Dr. Angelena Form 08/04/18.

## 2018-08-03 NOTE — Progress Notes (Signed)
Virtual Visit via Video Note   This visit type was conducted due to national recommendations for restrictions regarding the COVID-19 Pandemic (e.g. social distancing) in an effort to limit this patient's exposure and mitigate transmission in our community.  Due to his co-morbid illnesses, this patient is at least at moderate risk for complications without adequate follow up.  This format is felt to be most appropriate for this patient at this time.  All issues noted in this document were discussed and addressed.  A limited physical exam was performed with this format.  Please refer to the patient's chart for his consent to telehealth for [facility].   Date:  08/04/2018   ID:  Isaac Patel, DOB November 07, 1965, MRN [id number]  Patient Location: Home Provider Location: Home  PCP:  [name], NP  Cardiologist:  [name], MD   Electrophysiologist:  None   Evaluation Performed:  Follow-Up Visit  Chief Complaint:  Shortness of breath, chest pain   History of Present Illness:    Isaac Patel is a 53 y.o. male with CAD s/p NSTEMI in 8/16 tx with DES to the proximal LAD. Cardiac Catheterizationin 2/2018demonstrated a patent LAD stent.  He was admitted 05/2018 with chest and abdominal pain.  He ruled out for ACS.  An echocardiogram demonstrated moderate to severe aortic insufficiency.  Transesophageal echocardiogram confirmed severe aortic insufficiency.  His LV is dilated and he was felt to meet criteria for aortic valve replacement.  The plan was to proceed with further work-up as an outpatient. This has been placed on hold due to restrictions related to COVID-19.  When last seen in 05/2018, he was doing well without significant symptoms.  We postponed his scheduled telehealth visit in April because he remained asymptomatic and had him set up to see Dr. Radford Pax in June.  However, he called in yesterday with complaints of worsenig shortness of breath and chest pain.  We were going  to bring him in for an in person visit with the attending MD.  However, his wife currently has a fever and her COVID-19 test is pending.    Today, he notes symptoms of shortness of breath and substernal chest discomfort for the last several days.  He also notes abdominal fullness.  He has had some orthopnea but has not had paroxysmal internal dyspnea.  He has had some lightheadedness at times but no syncope or near syncope.  He has not noticed any significant lower extremity swelling.  He feels symptoms similar to what he had when he went to the hospital in March and was diagnosed with severe aortic insufficiency.  Activity seems to make his symptoms worse.  Of note, his wife is currently being tested for COVID-19 due to low-grade fever.  She is quarantined in their house separate from him.  He has not had any fever or cough.  The patient does not have symptoms concerning for COVID-19 infection (fever, chills, cough, or new shortness of breath).    Past Medical History:  Diagnosis Date   CAD (coronary artery disease)    a.  Non-STEMI 8/16: LHC-proximal LAD 80% treated with a resolute DES, EF 50-55%;  b.  Echo 8/16:  Moderate LVH, EF 55-60%, normal wall motion, normal diastolic function, normal RV function   Depression    hx   GERD (gastroesophageal reflux disease)    Headache    "maybe monthly" (10/28/2014)   HLD (hyperlipidemia)    Hypertension    NSTEMI (non-ST elevated myocardial infarction) ([place]) 10/28/2014  OSA (obstructive sleep apnea)    "tried mask; wear it off and on" (10/28/2014)   Past Surgical History:  Procedure Laterality Date   ARM AMPUTATION THROUGH FOREARM Left 2009   traumatic injury   CARDIAC CATHETERIZATION N/A 10/29/2014   Procedure: Left Heart Cath and Coronary Angiography;  Surgeon: Troy Sine, MD;  Location: [facility];  Service: Cardiovascular;  Laterality: N/A;   CHOLESTEATOMA EXCISION Right 1990's   LEFT HEART CATH AND CORONARY ANGIOGRAPHY N/A  05/25/2016   Procedure: Left Heart Cath and Coronary Angiography;  Surgeon: Sherren Mocha, MD;  Location: [place] [facility];  Service: Cardiovascular;  Laterality: N/A;   NASAL SEPTOPLASTY W/ TURBINOPLASTY Bilateral 11/30/2017   Procedure: NASAL SEPTOPLASTY WITH TURBINATE REDUCTION;  Surgeon: Jerrell Belfast, MD;  Location: [place];  Service: ENT;  Laterality: Bilateral;   TEE WITHOUT CARDIOVERSION N/A 06/08/2018   Procedure: TRANSESOPHAGEAL ECHOCARDIOGRAM (TEE);  Surgeon: Buford Dresser, MD;  Location: [facility];  Service: Cardiovascular;  Laterality: N/A;   TYMPANOSTOMY TUBE PLACEMENT Bilateral "as a kid"   "had one done as an adult too"   WISDOM TOOTH EXTRACTION       Current Meds  Medication Sig   aspirin EC 81 MG tablet Take 81 mg by mouth daily.   atorvastatin (LIPITOR) 80 MG tablet Take 1 tablet (80 mg total) by mouth daily at 6 PM.   cyclobenzaprine (FLEXERIL) 5 MG tablet Take 1 tablet (5 mg total) by mouth 3 (three) times daily as needed for muscle spasms.   diclofenac sodium (VOLTAREN) 1 % GEL Apply 2 g topically 4 (four) times daily as needed (irritation).    dicyclomine (BENTYL) 10 MG capsule Take 1 capsule (10 mg total) by mouth 4 (four) times daily as needed for spasms. For abdominal pain and bloating   gabapentin (NEURONTIN) 100 MG capsule Take 2 capsules (200 mg total) by mouth at bedtime.   lisinopril (PRINIVIL,ZESTRIL) 20 MG tablet Take 1 tablet (20 mg total) by mouth daily.   loratadine (CLARITIN) 10 MG tablet Take 10 mg by mouth daily.    Multiple Vitamin (MULTI-VITAMIN PO) Take 1 tablet by mouth daily.   nitroGLYCERIN (NITROSTAT) 0.4 MG SL tablet Place 1 tablet (0.4 mg total) under the tongue every 5 (five) minutes x 3 doses as needed for chest pain.   pantoprazole (PROTONIX) 40 MG tablet Take 1 tablet (40 mg total) by mouth 2 (two) times daily.     Allergies:   Morphine and related   Social History   Tobacco Use   Smoking status: Current  Some Day Smoker    Packs/day: 0.33    Years: 32.00    Pack years: 10.56    Types: Cigarettes   Smokeless tobacco: Never Used  Substance Use Topics   Alcohol use: No    Alcohol/week: 2.0 - 3.0 standard drinks    Types: 2 - 3 Standard drinks or equivalent per week    Frequency: Never    Comment: stopped 06/19/2015,    Drug use: Yes    Types: Marijuana    Comment: 05/24/2016  "stopped marijuana in the early 2000's"     Family Hx: The patient's family history includes Bradycardia in his father; Diverticulitis in his mother; Healthy in his father; Multiple sclerosis in his maternal grandfather; Skin cancer in his mother. There is no history of Colon cancer, Stomach cancer, Rectal cancer, Liver cancer, or Esophageal cancer.  ROS:   Please see the history of present illness.    All  other systems reviewed and are negative. ° ° °Prior CV studies:   °The following studies were reviewed today: ° °Transesophageal echocardiogram 06/08/2018 °EF 50-55, moderately dilated, normal RV SF, severe AI, mild plaque descending aorta °  °Echocardiogram 06/07/2018 °EF 55-60, moderately dilated, normal RV SF, mild MAC, moderate to severe AI °  °LHC 05/25/16 °LAD prox stent ok °LCx irregs °RCA normal °EF 55 °  °Echo 10/30/14 °Moderate LVH, EF 55-60%, normal wall motion, normal diastolic function, normal RV function °  °LHC 10/29/14 °LAD: Proximal-mid 80% °EF 50-55% with small focal region of mild mid anterolateral hypokinesis °PCI: 3 x 15 mm resolute DES to the LAD ° °Labs/Other Tests and Data Reviewed:   ° °EKG:  No ECG reviewed. ° °Recent Labs: °09/21/2017: Magnesium 2.2 °05/10/2018: ALT 16 °06/06/2018: BUN 21; Creatinine, Ser 1.10; Potassium 4.0; Sodium 137 °06/07/2018: TSH 1.414 °06/08/2018: Hemoglobin 14.1; Platelets 208  ° °Recent Lipid Panel °Lab Results  °Component Value Date/Time  ° CHOL 124 09/21/2017 10:05 AM  ° TRIG 151.0 (H) 09/21/2017 10:05 AM  ° HDL 35.90 (L) 09/21/2017 10:05 AM  ° CHOLHDL 3 09/21/2017 10:05 AM  °  LDLCALC 58 09/21/2017 10:05 AM  ° ° °Wt Readings from Last 3 Encounters:  °08/04/18 205 lb (93 kg)  °06/16/18 209 lb (94.8 kg)  °06/06/18 210 lb 12.2 oz (95.6 kg)  °  ° °Objective:   ° °Vital Signs:  BP 140/72    Pulse 74    Ht 5' 11" (1.803 m)    Wt 205 lb (93 kg)    BMI 28.59 kg/m²   ° °VITAL SIGNS:  reviewed °GEN:  no acute distress °EYES:  sclerae anicteric, EOMI - Extraocular Movements Intact °RESPIRATORY:  Normal respiratory effort °CARDIOVASCULAR:  no peripheral edema °NEURO:  alert and oriented x 3, no obvious focal deficit °PSYCH:  normal affect ° °ASSESSMENT & PLAN:   ° °Nonrheumatic aortic valve insufficiency °He has severe, symptomatic stage D aortic insufficiency.  Further evaluation was placed on hold in March due to restrictions set forth by COVID-19.  He had been doing well from a symptomatic standpoint until recently.  He now has symptoms that suggest worsening aortic insufficiency.  As the hospital is currently starting to open up more testing and his symptoms have progressed, I think we should proceed with cardiac catheterization and referral to cardiac surgery.  I reviewed this with Dr. Turner who agreed.  Risks and benefits of cardiac catheterization have been discussed with the patient.  These include bleeding, infection, kidney damage, stroke, heart attack, death.  The patient understands these risks and is willing to proceed.  Continue current dose of ACE inhibitor.  I will place him on a low-dose of furosemide. ° - Lasix 20 mg every Mon, Wed, Fri ° - Arrange R/L heart cath next Friday ° - Refer to [facility] ° - I spoke to our hospital coordinator who will follow up with him on his wife's COVID test. ° - He will likely need a rapid COVID test the day of his cath. ° °Coronary artery disease involving native coronary artery of native heart with angina pectoris (HCC) °S/p NSTEMI in 2016 tx with DES to the LAD.  Cardiac Catheterization in 2018 with patent LAD stent.    He is currently having anginal  symptoms that are related to aortic insufficiency.  Continue aspirin, statin.  Proceed with cardiac catheterization as noted. ° °Essential hypertension °Blood pressure somewhat elevated.  Add low-dose Lasix as noted above. ° °Hyperlipidemia °Continue high-dose   statin.  Educated About Covid-19 Virus Infection The signs and symptoms of COVID-19 were discussed with the patient and how to seek care for testing (follow up with PCP or arrange E-visit).  The importance of social distancing was discussed today.  Time:   Today, I have spent 20 minutes with the patient with telehealth technology discussing the above problems.     Medication Adjustments/Labs and Tests Ordered: Current medicines are reviewed at length with the patient today.  Concerns regarding medicines are outlined above.   Tests Ordered: No orders of the defined types were placed in this encounter.   Medication Changes: Meds ordered this encounter  Medications   furosemide (LASIX) 20 MG tablet    Sig: Take 1 tablet every Monday, Wednesday and Friday.    Dispense:  30 tablet    Refill:  0    Order Specific Question:   Supervising Provider    Answer:   Lelon Perla [[id number]]    Disposition:  Follow up Follow-up after cardiac catheterization will be arranged.  Signed, Richardson Dopp, PA-C  08/04/2018 2:18 PM    [facility]

## 2018-08-03 NOTE — H&P (View-Only) (Signed)
Virtual Visit via Video Note   This visit type was conducted due to national recommendations for restrictions regarding the COVID-19 Pandemic (e.g. social distancing) in an effort to limit this patient's exposure and mitigate transmission in our community.  Due to his co-morbid illnesses, this patient is at least at moderate risk for complications without adequate follow up.  This format is felt to be most appropriate for this patient at this time.  All issues noted in this document were discussed and addressed.  A limited physical exam was performed with this format.  Please refer to the patient's chart for his consent to telehealth for [facility].   Date:  08/04/2018   ID:  Isaac Patel, DOB November 07, 1965, MRN [id number]  Patient Location: Home Provider Location: Home  PCP:  [name], NP  Cardiologist:  [name], MD   Electrophysiologist:  None   Evaluation Performed:  Follow-Up Visit  Chief Complaint:  Shortness of breath, chest pain   History of Present Illness:    Isaac Patel is a 53 y.o. male with CAD s/p NSTEMI in 8/16 tx with DES to the proximal LAD. Cardiac Catheterizationin 2/2018demonstrated a patent LAD stent.  He was admitted 05/2018 with chest and abdominal pain.  He ruled out for ACS.  An echocardiogram demonstrated moderate to severe aortic insufficiency.  Transesophageal echocardiogram confirmed severe aortic insufficiency.  His LV is dilated and he was felt to meet criteria for aortic valve replacement.  The plan was to proceed with further work-up as an outpatient. This has been placed on hold due to restrictions related to COVID-19.  When last seen in 05/2018, he was doing well without significant symptoms.  We postponed his scheduled telehealth visit in April because he remained asymptomatic and had him set up to see Dr. Radford Pax in June.  However, he called in yesterday with complaints of worsenig shortness of breath and chest pain.  We were going  to bring him in for an in person visit with the attending MD.  However, his wife currently has a fever and her COVID-19 test is pending.    Today, he notes symptoms of shortness of breath and substernal chest discomfort for the last several days.  He also notes abdominal fullness.  He has had some orthopnea but has not had paroxysmal internal dyspnea.  He has had some lightheadedness at times but no syncope or near syncope.  He has not noticed any significant lower extremity swelling.  He feels symptoms similar to what he had when he went to the hospital in March and was diagnosed with severe aortic insufficiency.  Activity seems to make his symptoms worse.  Of note, his wife is currently being tested for COVID-19 due to low-grade fever.  She is quarantined in their house separate from him.  He has not had any fever or cough.  The patient does not have symptoms concerning for COVID-19 infection (fever, chills, cough, or new shortness of breath).    Past Medical History:  Diagnosis Date   CAD (coronary artery disease)    a.  Non-STEMI 8/16: LHC-proximal LAD 80% treated with a resolute DES, EF 50-55%;  b.  Echo 8/16:  Moderate LVH, EF 55-60%, normal wall motion, normal diastolic function, normal RV function   Depression    hx   GERD (gastroesophageal reflux disease)    Headache    "maybe monthly" (10/28/2014)   HLD (hyperlipidemia)    Hypertension    NSTEMI (non-ST elevated myocardial infarction) ([place]) 10/28/2014  OSA (obstructive sleep apnea)    "tried mask; wear it off and on" (10/28/2014)   Past Surgical History:  Procedure Laterality Date   ARM AMPUTATION THROUGH FOREARM Left 2009   traumatic injury   CARDIAC CATHETERIZATION N/A 10/29/2014   Procedure: Left Heart Cath and Coronary Angiography;  Surgeon: Troy Sine, MD;  Location: [facility];  Service: Cardiovascular;  Laterality: N/A;   CHOLESTEATOMA EXCISION Right 1990's   LEFT HEART CATH AND CORONARY ANGIOGRAPHY N/A  05/25/2016   Procedure: Left Heart Cath and Coronary Angiography;  Surgeon: Sherren Mocha, MD;  Location: [place] [facility];  Service: Cardiovascular;  Laterality: N/A;   NASAL SEPTOPLASTY W/ TURBINOPLASTY Bilateral 11/30/2017   Procedure: NASAL SEPTOPLASTY WITH TURBINATE REDUCTION;  Surgeon: Jerrell Belfast, MD;  Location: [place];  Service: ENT;  Laterality: Bilateral;   TEE WITHOUT CARDIOVERSION N/A 06/08/2018   Procedure: TRANSESOPHAGEAL ECHOCARDIOGRAM (TEE);  Surgeon: Buford Dresser, MD;  Location: [facility];  Service: Cardiovascular;  Laterality: N/A;   TYMPANOSTOMY TUBE PLACEMENT Bilateral "as a kid"   "had one done as an adult too"   WISDOM TOOTH EXTRACTION       Current Meds  Medication Sig   aspirin EC 81 MG tablet Take 81 mg by mouth daily.   atorvastatin (LIPITOR) 80 MG tablet Take 1 tablet (80 mg total) by mouth daily at 6 PM.   cyclobenzaprine (FLEXERIL) 5 MG tablet Take 1 tablet (5 mg total) by mouth 3 (three) times daily as needed for muscle spasms.   diclofenac sodium (VOLTAREN) 1 % GEL Apply 2 g topically 4 (four) times daily as needed (irritation).    dicyclomine (BENTYL) 10 MG capsule Take 1 capsule (10 mg total) by mouth 4 (four) times daily as needed for spasms. For abdominal pain and bloating   gabapentin (NEURONTIN) 100 MG capsule Take 2 capsules (200 mg total) by mouth at bedtime.   lisinopril (PRINIVIL,ZESTRIL) 20 MG tablet Take 1 tablet (20 mg total) by mouth daily.   loratadine (CLARITIN) 10 MG tablet Take 10 mg by mouth daily.    Multiple Vitamin (MULTI-VITAMIN PO) Take 1 tablet by mouth daily.   nitroGLYCERIN (NITROSTAT) 0.4 MG SL tablet Place 1 tablet (0.4 mg total) under the tongue every 5 (five) minutes x 3 doses as needed for chest pain.   pantoprazole (PROTONIX) 40 MG tablet Take 1 tablet (40 mg total) by mouth 2 (two) times daily.     Allergies:   Morphine and related   Social History   Tobacco Use   Smoking status: Current  Some Day Smoker    Packs/day: 0.33    Years: 32.00    Pack years: 10.56    Types: Cigarettes   Smokeless tobacco: Never Used  Substance Use Topics   Alcohol use: No    Alcohol/week: 2.0 - 3.0 standard drinks    Types: 2 - 3 Standard drinks or equivalent per week    Frequency: Never    Comment: stopped 06/19/2015,    Drug use: Yes    Types: Marijuana    Comment: 05/24/2016  "stopped marijuana in the early 2000's"     Family Hx: The patient's family history includes Bradycardia in his father; Diverticulitis in his mother; Healthy in his father; Multiple sclerosis in his maternal grandfather; Skin cancer in his mother. There is no history of Colon cancer, Stomach cancer, Rectal cancer, Liver cancer, or Esophageal cancer.  ROS:   Please see the history of present illness.    All  other systems reviewed and are negative.   Prior CV studies:   The following studies were reviewed today:  Transesophageal echocardiogram 06/08/2018 EF 50-55, moderately dilated, normal RV SF, severe AI, mild plaque descending aorta   Echocardiogram 06/07/2018 EF 55-60, moderately dilated, normal RV SF, mild MAC, moderate to severe AI   LHC 05/25/16 LAD prox stent ok LCx irregs RCA normal EF 55   Echo 10/30/14 Moderate LVH, EF 55-60%, normal wall motion, normal diastolic function, normal RV function   LHC 10/29/14 LAD: Proximal-mid 80% EF 50-55% with small focal region of mild mid anterolateral hypokinesis PCI: 3 x 15 mm resolute DES to the LAD  Labs/Other Tests and Data Reviewed:    EKG:  No ECG reviewed.  Recent Labs: 09/21/2017: Magnesium 2.2 05/10/2018: ALT 16 06/06/2018: BUN 21; Creatinine, Ser 1.10; Potassium 4.0; Sodium 137 06/07/2018: TSH 1.414 06/08/2018: Hemoglobin 14.1; Platelets 208   Recent Lipid Panel Lab Results  Component Value Date/Time   CHOL 124 09/21/2017 10:05 AM   TRIG 151.0 (H) 09/21/2017 10:05 AM   HDL 35.90 (L) 09/21/2017 10:05 AM   CHOLHDL 3 09/21/2017 10:05 AM    LDLCALC 58 09/21/2017 10:05 AM    Wt Readings from Last 3 Encounters:  08/04/18 205 lb (93 kg)  06/16/18 209 lb (94.8 kg)  06/06/18 210 lb 12.2 oz (95.6 kg)     Objective:    Vital Signs:  BP 140/72    Pulse 74    Ht _0  (1.803 m)    Wt 205 lb (93 kg)    BMI 28.59 kg/m    VITAL SIGNS:  reviewed GEN:  no acute distress EYES:  sclerae anicteric, EOMI - Extraocular Movements Intact RESPIRATORY:  Normal respiratory effort CARDIOVASCULAR:  no peripheral edema NEURO:  alert and oriented x 3, no obvious focal deficit PSYCH:  normal affect  ASSESSMENT & PLAN:    Nonrheumatic aortic valve insufficiency He has severe, symptomatic stage D aortic insufficiency.  Further evaluation was placed on hold in March due to restrictions set forth by COVID-19.  He had been doing well from a symptomatic standpoint until recently.  He now has symptoms that suggest worsening aortic insufficiency.  As the hospital is currently starting to open up more testing and his symptoms have progressed, I think we should proceed with cardiac catheterization and referral to cardiac surgery.  I reviewed this with Dr. Radford Pax who agreed.  Risks and benefits of cardiac catheterization have been discussed with the patient.  These include bleeding, infection, kidney damage, stroke, heart attack, death.  The patient understands these risks and is willing to proceed.  Continue current dose of ACE inhibitor.  I will place him on a low-dose of furosemide.  - Lasix 20 mg every Mon, Wed, Fri  - Arrange R/L heart cath next Friday  - Refer to [facility]  - I spoke to our hospital coordinator who will follow up with him on his wife's COVID test.  - He will likely need a rapid COVID test the day of his cath.  Coronary artery disease involving native coronary artery of native heart with angina pectoris ([place]) S/p NSTEMI in 2016 tx with DES to the LAD.  Cardiac Catheterization in 2018 with patent LAD stent.    He is currently having anginal  symptoms that are related to aortic insufficiency.  Continue aspirin, statin.  Proceed with cardiac catheterization as noted.  Essential hypertension Blood pressure somewhat elevated.  Add low-dose Lasix as noted above.  Hyperlipidemia Continue high-dose  statin.  Educated About Covid-19 Virus Infection The signs and symptoms of COVID-19 were discussed with the patient and how to seek care for testing (follow up with PCP or arrange E-visit).  The importance of social distancing was discussed today.  Time:   Today, I have spent 20 minutes with the patient with telehealth technology discussing the above problems.     Medication Adjustments/Labs and Tests Ordered: Current medicines are reviewed at length with the patient today.  Concerns regarding medicines are outlined above.   Tests Ordered: No orders of the defined types were placed in this encounter.   Medication Changes: Meds ordered this encounter  Medications   furosemide (LASIX) 20 MG tablet    Sig: Take 1 tablet every Monday, Wednesday and Friday.    Dispense:  30 tablet    Refill:  0    Order Specific Question:   Supervising Provider    Answer:   Lelon Perla [[id number]]    Disposition:  Follow up Follow-up after cardiac catheterization will be arranged.  Signed, Richardson Dopp, PA-C  08/04/2018 2:18 PM    [facility]

## 2018-08-04 ENCOUNTER — Telehealth: Payer: Self-pay | Admitting: Physician Assistant

## 2018-08-04 ENCOUNTER — Telehealth: Payer: Self-pay | Admitting: *Deleted

## 2018-08-04 ENCOUNTER — Encounter: Payer: Self-pay | Admitting: *Deleted

## 2018-08-04 ENCOUNTER — Encounter: Payer: Self-pay | Admitting: Physician Assistant

## 2018-08-04 ENCOUNTER — Telehealth (INDEPENDENT_AMBULATORY_CARE_PROVIDER_SITE_OTHER): Payer: 59 | Admitting: Physician Assistant

## 2018-08-04 ENCOUNTER — Ambulatory Visit: Payer: 59 | Admitting: Cardiovascular Disease

## 2018-08-04 ENCOUNTER — Other Ambulatory Visit: Payer: Self-pay

## 2018-08-04 VITALS — BP 140/72 | HR 74 | Ht 71.0 in | Wt 205.0 lb

## 2018-08-04 DIAGNOSIS — I351 Nonrheumatic aortic (valve) insufficiency: Secondary | ICD-10-CM

## 2018-08-04 DIAGNOSIS — E785 Hyperlipidemia, unspecified: Secondary | ICD-10-CM

## 2018-08-04 DIAGNOSIS — I2510 Atherosclerotic heart disease of native coronary artery without angina pectoris: Secondary | ICD-10-CM

## 2018-08-04 DIAGNOSIS — Z7189 Other specified counseling: Secondary | ICD-10-CM

## 2018-08-04 DIAGNOSIS — I25119 Atherosclerotic heart disease of native coronary artery with unspecified angina pectoris: Secondary | ICD-10-CM | POA: Diagnosis not present

## 2018-08-04 DIAGNOSIS — I10 Essential (primary) hypertension: Secondary | ICD-10-CM | POA: Diagnosis not present

## 2018-08-04 MED ORDER — FUROSEMIDE 20 MG PO TABS: ORAL_TABLET | ORAL | 0 refills | Status: DC

## 2018-08-04 NOTE — Telephone Encounter (Signed)
SPOKE WITH PT AWARE OF HEART CATH ON 08-11-18 AT 5:30 AM .PT INSTRUCTIONS SENT THROUGH MY CHART     PT AWARE OF REFERRAL TO [facility] AND WILL BE CONTACTED BACK BY THAT CLINIC FOR APPT.

## 2018-08-04 NOTE — Patient Instructions (Signed)
Medication Instructions:  Start Lasix 20 mg every Mon, Wed, Fri  If you need a refill on your cardiac medications before your next appointment, please call your pharmacy.   Lab work: Labs will be drawn the day of your cardiac catheterization  If you have labs (blood work) drawn today and your tests are completely normal, you will receive your results only by: Marland Kitchen MyChart Message (if you have MyChart) OR . A paper copy in the mail If you have any lab test that is abnormal or we need to change your treatment, we will call you to review the results.  Testing/Procedures: Our office will call you to schedule a cardiac catheterization.  Follow-Up: At [facility], you and your health needs are our priority.  As part of our continuing mission to provide you with exceptional heart care, we have created designated Provider Care Teams.  These Care Teams include your primary Cardiologist (physician) and Advanced Practice Providers (APPs -  Physician Assistants and Nurse Practitioners) who all work together to provide you with the care you need, when you need it. . We will arrange follow up after your cardiac catheterization.  Any Other Special Instructions Will Be Listed Below (If Applicable).  We will make a referral to the cardiac surgeon.  You should hear from their office soon.

## 2018-08-04 NOTE — Telephone Encounter (Signed)
This patient needs to be scheduled for a cardiac cath next Friday. His wife has a COVID test pending right now. He will be contacted by the cath lab RN Anderson Malta) on Tuesday. I have spoken to [place] at the hospital.  They will likely do a rapid COVID test on him the day of the cath. Please call patient with instructions for the cath.  Hold Lasix day of the cath. No contrast allergy. He needs a R/L heart cath.  Dx severe AI. Also, he needs a referral to [facility] for aortic valve surgery.  Please arrange. Please send me a note once the procedure is scheduled so that I can write orders. I am copying Edwena Blow and Desiree Lucy on this note. Richardson Dopp, PA-C    08/04/2018 2:02 PM

## 2018-08-07 ENCOUNTER — Other Ambulatory Visit (HOSPITAL_COMMUNITY): Payer: Self-pay | Admitting: Cardiology

## 2018-08-07 NOTE — Telephone Encounter (Addendum)
I called patient to see if the result his wife's Covid-19 test was finalized. I spoke with his wife, Isaac Patel, she said her result was still pending, she should have result tomorrow. Advised her I would follow up with her tomorrow, then finalize plans for patient [facility] 08/11/18.  Pt's wife asked me to note pt is a left arm amputee and requests femoral artery access for [facility].

## 2018-08-08 ENCOUNTER — Other Ambulatory Visit (HOSPITAL_COMMUNITY)
Admission: RE | Admit: 2018-08-08 | Discharge: 2018-08-08 | Disposition: A | Discharge status: Home / Self Care | Payer: 59 | Source: Ambulatory Visit | Attending: Cardiology | Admitting: Cardiology

## 2018-08-08 DIAGNOSIS — Z1159 Encounter for screening for other viral diseases: Secondary | ICD-10-CM | POA: Diagnosis not present

## 2018-08-08 NOTE — Addendum Note (Signed)
Addended byKathlen Mody, Nicki Reaper T on: 08/08/2018 10:13 AM   Modules accepted: Orders, SmartSet

## 2018-08-08 NOTE — Telephone Encounter (Signed)
I spoke with patient's wife, she does not have her Covid test results and has received message they may be delayed 5-7 days. Per Richardson Dopp, PA-pt should go ahead and get Covid test today prior to procedure 08/11/18, continue to isolate from wife, quarantine until he goes to hospital for procedure 08/11/18.  I spoke with patient's wife, Covid testing has been scheduled for patient today at 1:10 PM [facility] drive-through.

## 2018-08-09 ENCOUNTER — Telehealth: Payer: Self-pay | Admitting: Cardiology

## 2018-08-09 LAB — NOVEL CORONAVIRUS, NAA (HOSP ORDER, SEND-OUT TO REF LAB; TAT 18-24 HRS): SARS-CoV-2, NAA: NOT DETECTED

## 2018-08-09 NOTE — Progress Notes (Signed)
Patient's wife call Isaac Patel called to say her Covid swab is negative. So we can do cath on Friday.

## 2018-08-09 NOTE — Telephone Encounter (Signed)
New Message   Patients wife's Covid report was negative, call her is you have any questions.

## 2018-08-10 ENCOUNTER — Telehealth: Payer: Self-pay | Admitting: *Deleted

## 2018-08-10 NOTE — Telephone Encounter (Addendum)
Pt contacted pre-catheterization scheduled at [facility] for: Friday Aug 11, 2018  7:30 AM Verified arrival time and place: [place] Entrance A at: 5:30 AM Covid-19-5/12/20 not detected  No solid food after midnight prior to cath, clear liquids until 5 AM day of procedure. Contrast allergy: no  Hold: furosemide-AM of procedure.  Except hold medications AM meds can be  taken pre-cath with sip of water including: ASA 81 mg    Confirmed patient has responsible person to drive home post procedure and observe 24 hours after arriving home: yes   Pt advised due to Covid-19 pandemic, [facility] is restricting visitors and only patients should present for check-in prior to their procedure. People will not be allowed to enter [facility] with the patient. At this time [facility] is not allowing visitors to all [facility] campuses.    Per Dr Leanne Chang Daily Covid Update 08/08/18: Universal masks: Effective immediately, we are requiring all ED patients and all ambulatory care patients to wear a universal mask, which we will provide. In-patients will have a mask at their bedside and will only be asked to put it on when a caregiver comes into their room.       COVID-19 Pre-Screening Questions:  . In the past 7 to 10 days have you had a cough,  shortness of breath, headache, congestion, fever, body aches, chills, sore throat, or sudden loss of taste or sense of smell? no . Have you been around anyone with known Covid 19? no . Have you been around anyone who is awaiting Covid 19 test results in the past 7 to 10 days? Yes-wife received negative test results 08/09/18 . Have you been around anyone who has been exposed to Covid 19, or has mentioned symptoms of Covid 19 within the past 7 to 10 days? Yes-wife-tested negative

## 2018-08-11 ENCOUNTER — Encounter (HOSPITAL_COMMUNITY)
Admission: RE | Disposition: A | Discharge status: Home / Self Care | Payer: Self-pay | Source: Home / Self Care | Attending: Cardiovascular Disease

## 2018-08-11 ENCOUNTER — Ambulatory Visit (HOSPITAL_COMMUNITY)
Admission: RE | Admit: 2018-08-11 | Discharge: 2018-08-11 | Disposition: A | Discharge status: Home / Self Care | Payer: 59 | Attending: Cardiovascular Disease | Admitting: Cardiovascular Disease

## 2018-08-11 ENCOUNTER — Other Ambulatory Visit: Payer: Self-pay

## 2018-08-11 DIAGNOSIS — Z885 Allergy status to narcotic agent status: Secondary | ICD-10-CM | POA: Insufficient documentation

## 2018-08-11 DIAGNOSIS — Z7982 Long term (current) use of aspirin: Secondary | ICD-10-CM | POA: Diagnosis not present

## 2018-08-11 DIAGNOSIS — Z955 Presence of coronary angioplasty implant and graft: Secondary | ICD-10-CM | POA: Diagnosis not present

## 2018-08-11 DIAGNOSIS — Z79899 Other long term (current) drug therapy: Secondary | ICD-10-CM | POA: Insufficient documentation

## 2018-08-11 DIAGNOSIS — I25119 Atherosclerotic heart disease of native coronary artery with unspecified angina pectoris: Secondary | ICD-10-CM | POA: Diagnosis not present

## 2018-08-11 DIAGNOSIS — G4733 Obstructive sleep apnea (adult) (pediatric): Secondary | ICD-10-CM | POA: Diagnosis not present

## 2018-08-11 DIAGNOSIS — I351 Nonrheumatic aortic (valve) insufficiency: Secondary | ICD-10-CM | POA: Insufficient documentation

## 2018-08-11 DIAGNOSIS — E785 Hyperlipidemia, unspecified: Secondary | ICD-10-CM | POA: Diagnosis not present

## 2018-08-11 DIAGNOSIS — F17210 Nicotine dependence, cigarettes, uncomplicated: Secondary | ICD-10-CM | POA: Diagnosis not present

## 2018-08-11 DIAGNOSIS — K219 Gastro-esophageal reflux disease without esophagitis: Secondary | ICD-10-CM | POA: Insufficient documentation

## 2018-08-11 DIAGNOSIS — F329 Major depressive disorder, single episode, unspecified: Secondary | ICD-10-CM | POA: Insufficient documentation

## 2018-08-11 DIAGNOSIS — I252 Old myocardial infarction: Secondary | ICD-10-CM | POA: Diagnosis not present

## 2018-08-11 DIAGNOSIS — Z791 Long term (current) use of non-steroidal anti-inflammatories (NSAID): Secondary | ICD-10-CM | POA: Insufficient documentation

## 2018-08-11 DIAGNOSIS — I10 Essential (primary) hypertension: Secondary | ICD-10-CM | POA: Diagnosis not present

## 2018-08-11 DIAGNOSIS — Z952 Presence of prosthetic heart valve: Secondary | ICD-10-CM | POA: Insufficient documentation

## 2018-08-11 HISTORY — PX: RIGHT/LEFT HEART CATH AND CORONARY ANGIOGRAPHY: CATH118266

## 2018-08-11 LAB — POCT I-STAT EG7
Acid-base deficit: 1 mmol/L (ref 0.0–2.0)
Bicarbonate: 25.5 mmol/L (ref 20.0–28.0)
Calcium, Ion: 1.15 mmol/L (ref 1.15–1.40)
HCT: 38 % — ABNORMAL LOW (ref 39.0–52.0)
Hemoglobin: 12.9 g/dL — ABNORMAL LOW (ref 13.0–17.0)
O2 Saturation: 71 %
Potassium: 3.9 mmol/L (ref 3.5–5.1)
Sodium: 143 mmol/L (ref 135–145)
TCO2: 27 mmol/L (ref 22–32)
pCO2, Ven: 46.8 mmHg (ref 44.0–60.0)
pH, Ven: 7.344 (ref 7.250–7.430)
pO2, Ven: 40 mmHg (ref 32.0–45.0)

## 2018-08-11 LAB — CBC
HCT: 44 % (ref 39.0–52.0)
Hemoglobin: 15 g/dL (ref 13.0–17.0)
MCH: 31.7 pg (ref 26.0–34.0)
MCHC: 34.1 g/dL (ref 30.0–36.0)
MCV: 93 fL (ref 80.0–100.0)
Platelets: 217 10*3/uL (ref 150–400)
RBC: 4.73 MIL/uL (ref 4.22–5.81)
RDW: 12 % (ref 11.5–15.5)
WBC: 7.5 10*3/uL (ref 4.0–10.5)
nRBC: 0 % (ref 0.0–0.2)

## 2018-08-11 LAB — BASIC METABOLIC PANEL
Anion gap: 7 (ref 5–15)
BUN: 24 mg/dL — ABNORMAL HIGH (ref 6–20)
CO2: 24 mmol/L (ref 22–32)
Calcium: 8.6 mg/dL — ABNORMAL LOW (ref 8.9–10.3)
Chloride: 109 mmol/L (ref 98–111)
Creatinine, Ser: 0.97 mg/dL (ref 0.61–1.24)
GFR calc Af Amer: >60 mL/min (ref >60)
GFR calc non Af Amer: >60 mL/min (ref >60)
Glucose, Bld: 95 mg/dL (ref 70–99)
Potassium: 3.9 mmol/L (ref 3.5–5.1)
Sodium: 140 mmol/L (ref 135–145)

## 2018-08-11 LAB — POCT I-STAT 7, (LYTES, BLD GAS, ICA,H+H)
Bicarbonate: 22.4 mmol/L (ref 20.0–28.0)
Calcium, Ion: 1.1 mmol/L — ABNORMAL LOW (ref 1.15–1.40)
HCT: 38 % — ABNORMAL LOW (ref 39.0–52.0)
Hemoglobin: 12.9 g/dL — ABNORMAL LOW (ref 13.0–17.0)
O2 Saturation: 100 %
Potassium: 4.2 mmol/L (ref 3.5–5.1)
Sodium: 142 mmol/L (ref 135–145)
TCO2: 23 mmol/L (ref 22–32)
pCO2 arterial: 28.6 mmHg — ABNORMAL LOW (ref 32.0–48.0)
pH, Arterial: 7.502 — ABNORMAL HIGH (ref 7.350–7.450)
pO2, Arterial: 172 mmHg — ABNORMAL HIGH (ref 83.0–108.0)

## 2018-08-11 SURGERY — RIGHT/LEFT HEART CATH AND CORONARY ANGIOGRAPHY
Anesthesia: LOCAL

## 2018-08-11 MED ORDER — ASPIRIN 81 MG PO CHEW: 81.0000 mg | CHEWABLE_TABLET | ORAL | Status: DC

## 2018-08-11 MED ORDER — IOHEXOL 350 MG/ML SOLN
INTRAVENOUS | Status: DC | PRN
  Administered 2018-08-11: 55 mL via INTRA_ARTERIAL

## 2018-08-11 MED ORDER — LIDOCAINE HCL (PF) 1 % IJ SOLN
INTRAMUSCULAR | Status: AC
  Filled 2018-08-11: qty 30

## 2018-08-11 MED ORDER — HEPARIN (PORCINE) IN NACL 1000-0.9 UT/500ML-% IV SOLN
INTRAVENOUS | Status: AC
  Filled 2018-08-11: qty 1000

## 2018-08-11 MED ORDER — SODIUM CHLORIDE 0.9 % IV SOLN: INTRAVENOUS | Status: DC

## 2018-08-11 MED ORDER — FENTANYL CITRATE (PF) 100 MCG/2ML IJ SOLN
INTRAMUSCULAR | Status: AC
  Filled 2018-08-11: qty 2

## 2018-08-11 MED ORDER — SODIUM CHLORIDE 0.9 % WEIGHT BASED INFUSION: 1.0000 mL/kg/h | INTRAVENOUS | Status: DC

## 2018-08-11 MED ORDER — FENTANYL CITRATE (PF) 100 MCG/2ML IJ SOLN
INTRAMUSCULAR | Status: DC | PRN
  Administered 2018-08-11 (×2): 25 ug via INTRAVENOUS

## 2018-08-11 MED ORDER — HYDRALAZINE HCL 20 MG/ML IJ SOLN: 10.0000 mg | INTRAMUSCULAR | Status: DC | PRN

## 2018-08-11 MED ORDER — ACETAMINOPHEN 325 MG PO TABS: 650.0000 mg | ORAL_TABLET | ORAL | Status: DC | PRN

## 2018-08-11 MED ORDER — MIDAZOLAM HCL 2 MG/2ML IJ SOLN
INTRAMUSCULAR | Status: AC
  Filled 2018-08-11: qty 2

## 2018-08-11 MED ORDER — HEPARIN (PORCINE) IN NACL 1000-0.9 UT/500ML-% IV SOLN
INTRAVENOUS | Status: DC | PRN
  Administered 2018-08-11 (×2): 500 mL

## 2018-08-11 MED ORDER — LIDOCAINE HCL (PF) 1 % IJ SOLN
INTRAMUSCULAR | Status: DC | PRN
  Administered 2018-08-11: 15 mL
  Administered 2018-08-11: 2 mL

## 2018-08-11 MED ORDER — SODIUM CHLORIDE 0.9% FLUSH: 3.0000 mL | INTRAVENOUS | Status: DC | PRN

## 2018-08-11 MED ORDER — SODIUM CHLORIDE 0.9% FLUSH: 3.0000 mL | Freq: Two times a day (BID) | INTRAVENOUS | Status: DC

## 2018-08-11 MED ORDER — SODIUM CHLORIDE 0.9 % IV SOLN: 250.0000 mL | INTRAVENOUS | Status: DC | PRN

## 2018-08-11 MED ORDER — LABETALOL HCL 5 MG/ML IV SOLN: 10.0000 mg | INTRAVENOUS | Status: DC | PRN

## 2018-08-11 MED ORDER — ONDANSETRON HCL 4 MG/2ML IJ SOLN: 4.0000 mg | Freq: Four times a day (QID) | INTRAMUSCULAR | Status: DC | PRN

## 2018-08-11 MED ORDER — MIDAZOLAM HCL 2 MG/2ML IJ SOLN
INTRAMUSCULAR | Status: DC | PRN
  Administered 2018-08-11 (×2): 1 mg via INTRAVENOUS

## 2018-08-11 MED ORDER — SODIUM CHLORIDE 0.9 % WEIGHT BASED INFUSION
3.0000 mL/kg/h | INTRAVENOUS | Status: AC
  Administered 2018-08-11: 3 mL/kg/h via INTRAVENOUS

## 2018-08-11 SURGICAL SUPPLY — 12 items
CATH BALLN WEDGE 5F 110CM (CATHETERS) ×1 IMPLANT
CATH INFINITI MULTIPACK ST 5F (CATHETERS) ×1 IMPLANT
GLIDESHEATH SLEND SS 6F .021 (SHEATH) ×1 IMPLANT
KIT HEART LEFT (KITS) ×2 IMPLANT
PACK CARDIAC CATHETERIZATION (CUSTOM PROCEDURE TRAY) ×2 IMPLANT
SHEATH GLIDE SLENDER 4/5FR (SHEATH) ×1 IMPLANT
SHEATH PINNACLE 5F 10CM (SHEATH) ×1 IMPLANT
SHEATH PINNACLE 6F 10CM (SHEATH) IMPLANT
SHEATH PROBE COVER 6X72 (BAG) ×1 IMPLANT
TRANSDUCER W/STOPCOCK (MISCELLANEOUS) ×2 IMPLANT
TUBING CIL FLEX 10 FLL-RA (TUBING) ×2 IMPLANT
WIRE EMERALD 3MM-J .035X150CM (WIRE) ×1 IMPLANT

## 2018-08-11 NOTE — Interval H&P Note (Signed)
History and Physical Interval Note:  08/11/2018 7:19 AM  Isaac Patel  has presented today for surgery, with the diagnosis of angina.  The various methods of treatment have been discussed with the patient and family. After consideration of risks, benefits and other options for treatment, the patient has consented to  Procedure(s): RIGHT/LEFT HEART CATH AND CORONARY ANGIOGRAPHY (N/A) as a surgical intervention.  The patient's history has been reviewed, patient examined, no change in status, stable for surgery.  I have reviewed the patient's chart and labs.  Questions were answered to the patient's satisfaction.    Exam:  General: Well developed, well nourished, NAD  HEENT: OP clear, mucus membranes moist  SKIN: warm, dry. No rashes. Neuro: No focal deficits  Musculoskeletal: Muscle strength 5/5 all ext  Psychiatric: Mood and affect normal  Neck: No JVD, no carotid bruits, no thyromegaly, no lymphadenopathy.  Lungs:Clear bilaterally, no wheezes, rhonci, crackles Cardiovascular: Regular rate and rhythm. Diastolic Murmur noted.  Abdomen:Soft. Bowel sounds present. Non-tender.  Extremities: No lower extremity edema. Pulses are 2 + in the bilateral DP/PT.  Cath Lab Visit (complete for each Cath Lab visit)  Clinical Evaluation Leading to the Procedure:   ACS: No.  Non-ACS:    Anginal Classification: CCS II  Anti-ischemic medical therapy: No Therapy  Non-Invasive Test Results: No non-invasive testing performed  Prior CABG: No previous CABG    [name]

## 2018-08-11 NOTE — Discharge Instructions (Signed)
Isaac to Work ________[name]___________________________________________ was treated at our facility. Injury or illness was: ___Work-related. _X__Not work-related. ___Undetermined if work-related. Return to work  Employee may return to work on ___5/18/20___________________.  Employee may return to modified work on ___5/18/20___________________. Work activity restrictions This person is not able to do the following activities: ___Bend ___Sit for a prolonged time  This person should not sit for more than ____ hours at a time.  This person should not sit for more than ____ hours during an 8-hour workday. _X__Lift more than ____5____ lb ___Squat ___ Stand for a prolonged time  ___ This person should not stand for more than ____ hours at a time.  ___ This person should not stand for more than ____ hours during an 8-hour workday. ___Climb ___Reach ___Push and pull with the ___ right hand ___ left hand ___ Walk  ___ This person should not walk for more than ____ hours at a time.  ___ This person should not walk for more than ____ hours during an 8-hour workday. ___ Drive or operate a motor vehicle at work ___ [employer] with the ___ right hand ___ left hand ___Other _________________________________________________________________ These restrictions are effective until _____5/21/20_________________ or until a recheck appointment on ______________________. Health care provider name (printed): _________________________________________ Health care provider (signature): _________________________________________ Date: _________________________________________ How to use this form Show this Return to Work statement to your supervisor at work as soon as possible. Your employer should be aware of your condition and may be able to help with the necessary work activity restrictions. Contact your health care provider if:  You wish to return to work sooner than the date that is  listed above.  You have problems that make it difficult for you to return at that time. This information is not intended to replace advice given to you by your health care provider. Make sure you discuss any questions you have with your health care provider. Document Released: 03/15/2005 Document Revised: 03/10/2017 Document Reviewed: 03/10/2017 Elsevier Interactive Patient Education  2019 [place]. Femoral Site Care This sheet gives you information about how to care for yourself after your procedure. Your health care provider may also give you more specific instructions. If you have problems or questions, contact your health care provider. What can I expect after the procedure? After the procedure, it is common to have:  Bruising that usually fades within 1-2 weeks.  Tenderness at the site. Follow these instructions at home: Wound care  Follow instructions from your health care provider about how to take care of your insertion site. Make sure you: ? Wash your hands with soap and water before you change your bandage (dressing). If soap and water are not available, use hand sanitizer. ? Change your dressing as told by your health care provider. ? Leave stitches (sutures), skin glue, or adhesive strips in place. These skin closures may need to stay in place for 2 weeks or longer. If adhesive strip edges start to loosen and curl up, you may trim the loose edges. Do not remove adhesive strips completely unless your health care provider tells you to do that.  Do not take baths, swim, or use a hot tub until your health care provider approves.  You may shower 24-48 hours after the procedure or as told by your health care provider. ? Gently wash the site with plain soap and water. ? Pat the area dry with a clean towel. ? Do not rub the site. This may cause bleeding.  Do not apply  powder or lotion to the site. Keep the site clean and dry.  Check your femoral site every day for signs of  infection. Check for: ? Redness, swelling, or pain. ? Fluid or blood. ? Warmth. ? Pus or a bad smell. Activity  For the first 2-3 days after your procedure, or as long as directed: ? Avoid climbing stairs as much as possible. ? Do not squat.  Do not lift anything that is heavier than 10 lb (4.5 kg), or the limit that you are told, until your health care provider says that it is safe.  Rest as directed. ? Avoid sitting for a long time without moving. Get up to take short walks every 1-2 hours.  Do not drive for 24 hours if you were given a medicine to help you relax (sedative). General instructions  Take over-the-counter and prescription medicines only as told by your health care provider.  Keep all follow-up visits as told by your health care provider. This is important. Contact a health care provider if you have:  A fever or chills.  You have redness, swelling, or pain around your insertion site. Get help right away if:  The catheter insertion area swells very fast.  You pass out.  You suddenly start to sweat or your skin gets clammy.  The catheter insertion area is bleeding, and the bleeding does not stop when you hold steady pressure on the area.  The area near or just beyond the catheter insertion site becomes pale, cool, tingly, or numb. These symptoms may represent a serious problem that is an emergency. Do not wait to see if the symptoms will go away. Get medical help right away. Call your local emergency services (911 in the [place] Do not drive yourself to the hospital. Summary  After the procedure, it is common to have bruising that usually fades within 1-2 weeks.  Check your femoral site every day for signs of infection.  Do not lift anything that is heavier than 10 lb (4.5 kg), or the limit that you are told, until your health care provider says that it is safe. This information is not intended to replace advice given to you by your health care provider. Make  sure you discuss any questions you have with your health care provider. Document Released: 11/16/2013 Document Revised: 03/28/2017 Document Reviewed: 03/28/2017 Elsevier Interactive Patient Education  2019 [employer].

## 2018-08-11 NOTE — Progress Notes (Signed)
Site area: rt groin fa sheath Site Prior to Removal:  Level 0 Pressure Applied For: 20 minutes Manual:   yes Patient Status During Pull:  stable Post Pull Site:  Level  0 Post Pull Instructions Given:  yes Post Pull Pulses Present: rt dp palpable Dressing Applied:  Gauze and tegaderm Bedrest begins @ 2824 Comments:

## 2018-08-14 ENCOUNTER — Encounter (HOSPITAL_COMMUNITY): Payer: Self-pay | Admitting: Cardiovascular Disease

## 2018-08-14 MED FILL — Heparin Sod (Porcine)-NaCl IV Soln 1000 Unit/500ML-0.9%: INTRAVENOUS | Qty: 500 | Status: AC

## 2018-08-16 ENCOUNTER — Other Ambulatory Visit: Payer: Self-pay

## 2018-08-17 ENCOUNTER — Institutional Professional Consult (permissible substitution): Payer: 59 | Admitting: Cardiothoracic Surgery

## 2018-08-17 ENCOUNTER — Other Ambulatory Visit: Payer: Self-pay | Admitting: *Deleted

## 2018-08-17 ENCOUNTER — Encounter: Payer: Self-pay | Admitting: Cardiothoracic Surgery

## 2018-08-17 VITALS — BP 129/68 | HR 73 | Temp 98.6°F | Resp 16 | Ht 71.0 in | Wt 205.0 lb

## 2018-08-17 DIAGNOSIS — I351 Nonrheumatic aortic (valve) insufficiency: Secondary | ICD-10-CM

## 2018-08-17 DIAGNOSIS — I7409 Other arterial embolism and thrombosis of abdominal aorta: Secondary | ICD-10-CM

## 2018-08-17 NOTE — Progress Notes (Signed)
Isaac Patel       Isaac Patel,Isaac Patel             [phone]                    [facility] Medical Record #[id number] Date of Birth: [birth year]  Referring: Liliane Shi, PA-C Primary Care: [name], NP Primary Cardiologist: [name], MD  Chief Complaint:    Chief Complaint  Patient presents with  . Aortic Insuffiency    REGURG...ECHO 06/07/18, TEE 06/08/18 and CATH 08/11/18    History of Present Illness:    Isaac Patel 53 y.o. male referred to the office for consideration of aortic valve replacement.  The patient is well-known to cardiology having been followed for a history of non-STEMI myocardial infarction in 2016.  Follow-up cardiac catheterization in February 2018 demonstrated a patent stent in the LAD.  He has been followed by Dr. Radford Pax for atypical chest pain on and off, last being seen in November 2019.  He then presented to the [facility] emergency room with substernal chest pain on and off 2 days on June 06, 2018 the pain wasassociated with some mild nausea no diaphoresis, initial cardiac enzymes are negative.  He is admitted to rule out myocardial infarction and cardiology consult was requested.    Marks echo in inpatient rehab service.  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  Has been a long-term smoker, up to half pack 1 pack/day, since his admission in March he is significantly decreased.  He has had no signs and symptoms of fever chills or other evidence of endocarditis.  He notes that he has had poor dental care and has not seen a dentist for several years" has some bad teeth".   TEE was done while the patient was in the hospital confirming severe aortic insufficiency, neither the echocardiogram or the cardiac catheterization was done in a way to fully evaluate his aorta.  The patient was ultimately discharged home and not referred to surgery initially.  He noted increasing episodes of shortness  of breath while working in his yard.  Because of this he had a cardiac catheterization done on May 15 and was referred to cardiac surgery for consideration of aortic valve replacement after the cardiac catheterization.   Current Activity/ Functional Status:  Patient is independent with mobility/ambulation, transfers, ADL's, IADL's.   Zubrod Score: At the time of surgery this patient's most appropriate activity status/level should be described as: []     0    Normal activity, no symptoms [x]     1    Restricted in physical strenuous activity but ambulatory, able to do out light work []     2    Ambulatory and capable of self care, unable to do work activities, up and about               >50 % of waking hours                              []     3    Only limited self care, in bed greater than 50% of waking hours []     4    Completely disabled, no self care, confined to bed or chair []     5    Moribund   Past Medical History:  Diagnosis Date  . CAD (coronary  artery disease)    a.  Non-STEMI 8/16: LHC-proximal LAD 80% treated with a resolute DES, EF 50-55%;  b.  Echo 8/16:  Moderate LVH, EF 55-60%, normal wall motion, normal diastolic function, normal RV function  . Depression    hx  . GERD (gastroesophageal reflux disease)   . Headache    "maybe monthly" (10/28/2014)  . HLD (hyperlipidemia)   . Hypertension   . NSTEMI (non-ST elevated myocardial infarction) ([place]) 10/28/2014  . OSA (obstructive sleep apnea)    "tried mask; wear it off and on" (10/28/2014)    Past Surgical History:  Procedure Laterality Date  . 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 CARDIAC CATHETERIZATION N/A 10/29/2014   Procedure: Left Heart Cath and Coronary Angiography;  Surgeon: Troy Sine, MD;  Location: [facility];  Service: Cardiovascular;  Laterality: N/A;  . CHOLESTEATOMA EXCISION Right 1990's  . LEFT HEART CATH AND CORONARY ANGIOGRAPHY N/A 05/25/2016   Procedure: Left Heart Cath  and Coronary Angiography;  Surgeon: Sherren Mocha, MD;  Location: [facility];  Service: Cardiovascular;  Laterality: N/A;  . NASAL SEPTOPLASTY W/ TURBINOPLASTY Bilateral 11/30/2017   Procedure: NASAL SEPTOPLASTY WITH TURBINATE REDUCTION;  Surgeon: Jerrell Belfast, MD;  Location: [facility];  Service: ENT;  Laterality: Bilateral;  . RIGHT/LEFT HEART CATH AND CORONARY ANGIOGRAPHY N/A 08/11/2018   Procedure: RIGHT/LEFT HEART CATH AND CORONARY ANGIOGRAPHY;  Surgeon: Burnell Blanks, MD;  Location: [facility];  Service: Cardiovascular;  Laterality: N/A;  . TEE WITHOUT CARDIOVERSION N/A 06/08/2018   Procedure: TRANSESOPHAGEAL ECHOCARDIOGRAM (TEE);  Surgeon: Buford Dresser, MD;  Location: [facility];  Service: Cardiovascular;  Laterality: N/A;  . TYMPANOSTOMY TUBE PLACEMENT Bilateral "as a kid"   "had one done as an adult too"  . WISDOM TOOTH EXTRACTION      Family History  Problem Relation Age of Onset  . Diverticulitis Mother   . Skin cancer Mother   . Healthy Father   . Bradycardia Father   . Multiple sclerosis Maternal Grandfather   . Colon cancer Neg Hx   . Stomach cancer Neg Hx   . Rectal cancer Neg Hx   . Liver cancer Neg Hx   . Esophageal cancer Neg Hx      Social History   Tobacco Use  Smoking Status Current Some Day Smoker  . Packs/day: 0.33  . Years: 32.00  . Pack years: 10.56  . Types: Cigarettes  Smokeless Tobacco Never Used    Social History   Substance and Sexual Activity  Alcohol Use No  . Alcohol/week: 2.0 - 3.0 standard drinks  . Types: 2 - 3 Standard drinks or equivalent per week  . Frequency: Never   Comment: stopped 06/19/2015,      Allergies  Allergen Reactions  . Morphine And Related Itching    Current Outpatient Medications  Medication Sig Dispense Refill  . acetaminophen (TYLENOL) 500 MG tablet Take 1,000 mg by mouth daily as needed for moderate pain or headache.    Marland Kitchen aspirin EC 81 MG tablet Take 81 mg by mouth daily.     Marland Kitchen atorvastatin (LIPITOR) 80 MG tablet Take 1 tablet (80 mg total) by mouth daily at 6 PM. 90 tablet 3  . calcium carbonate (TUMS - DOSED IN MG ELEMENTAL CALCIUM) 500 MG chewable tablet Chew 2 tablets by mouth daily as needed for indigestion or heartburn.    . cyclobenzaprine (FLEXERIL) 5 MG tablet Take 1 tablet (5 mg total)  by mouth 3 (three) times daily as needed for muscle spasms. 30 tablet 0  . diclofenac sodium (VOLTAREN) 1 % GEL Apply 2 g topically 4 (four) times daily as needed (pain).     . fluticasone (FLONASE) 50 MCG/ACT nasal spray Place 1 spray into both nostrils daily as needed for allergies or rhinitis.    Marland Kitchen gabapentin (NEURONTIN) 100 MG capsule Take 2 capsules (200 mg total) by mouth at bedtime. 180 capsule 1  . lisinopril (PRINIVIL,ZESTRIL) 20 MG tablet Take 1 tablet (20 mg total) by mouth daily. 90 tablet 3  . loratadine (CLARITIN) 10 MG tablet Take 10 mg by mouth daily.     . Multiple Vitamin (MULTI-VITAMIN PO) Take 1 tablet by mouth daily.    . nitroGLYCERIN (NITROSTAT) 0.4 MG SL tablet Place 1 tablet (0.4 mg total) under the tongue every 5 (five) minutes x 3 doses as needed for chest pain. 25 tablet 12  . pantoprazole (PROTONIX) 40 MG tablet Take 1 tablet (40 mg total) by mouth 2 (two) times daily. 180 tablet 0  . furosemide (LASIX) 20 MG tablet Take 1 tablet every Monday, Wednesday and Friday. (Patient taking differently: Take 20 mg by mouth every Monday, Wednesday, and Friday. Take 1 tablet every Monday, Wednesday and Friday) 30 tablet 0   No current facility-administered medications for this visit.     Pertinent items are noted in HPI.   Review of Systems:     Cardiac Review of Systems: [Y] = yes  or   [ N ] = no   Chest Pain [  y  ]  Resting SOB [ n  ] Exertional SOB  [ y ]  Orthopnea [  ]   Pedal Edema [  n ]    Palpitations [ n ] Syncope  [n  ]   Presyncope [n   ]   General Review of Systems: [Y] = yes [  ]=no Constitional: recent weight change [  ];  Wt loss  over the last 3 months [   ] anorexia [  ]; fatigue [  ]; nausea [  ]; night sweats [  ]; fever [  ]; or chills [  ];           Eye : blurred vision [  ]; diplopia [   ]; vision changes [  ];  Amaurosis fugax[  ]; Resp: cough [  ];  wheezing[  ];  hemoptysis[  ]; shortness of breath[ y ]; paroxysmal nocturnal dyspnea[  ]; dyspnea on exertion[y  ]; or orthopnea[  ];  GI:  gallstones[  ], vomiting[  ];  dysphagia[  ]; melena[  ];  hematochezia [  ]; heartburn[  ];   Hx of  Colonoscopy[  ]; GU: kidney stones [  ]; hematuria[  ];   dysuria [  ];  nocturia[  ];  history of     obstruction [  ]; urinary frequency [  ]             Skin: rash, swelling[  ];, hair loss[  ];  peripheral edema[  ];  or itching[  ]; Musculosketetal: myalgias[  ];  joint swelling[  ];  joint erythema[  ];  joint pain[  ];  back pain[  ];  Heme/Lymph: bruising[  ];  bleeding[  ];  anemia[  ];  Neuro: TIA[  ];  headaches[  ];  stroke[  ];  vertigo[  ];  seizures[  ];   paresthesias[  ];  difficulty walking[  ];  Psych:depression[  ]; anxiety[  ];  Endocrine: diabetes[  ];  thyroid dysfunction[  ];  Immunizations: Flu up to date Blue.Reese  ]; Pneumococcal up to date Blue.Reese  ];  Other:     PHYSICAL EXAMINATION: BP 129/68 (BP Location: Right Arm, Patient Position: Sitting, Cuff Size: Normal)   Pulse 73   Temp 98.6 F (37 C) (Oral)   Resp 16   Ht 5\' 11"  (1.803 m)   Wt 205 lb (93 kg)   SpO2 95% Comment: ON RA  BMI 28.59 kg/m  General appearance: alert and cooperative Head: Normocephalic, without obvious abnormality, atraumatic Neck: no adenopathy, no carotid bruit, no JVD, supple, symmetrical, trachea midline and thyroid not enlarged, symmetric, no tenderness/mass/nodules Lymph nodes: Cervical, supraclavicular, and axillary nodes normal. Resp: clear to auscultation bilaterally Back: symmetric, no curvature. ROM normal. No CVA tenderness. 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   Recent Lab Findings: Lab Results  Component Value Date   WBC 7.5 08/11/2018   HGB 12.9 (L) 08/11/2018   HCT 38.0 (L) 08/11/2018   PLT 217 08/11/2018   GLUCOSE 95 08/11/2018   CHOL 124 09/21/2017   TRIG 151.0 (H) 09/21/2017   HDL 35.90 (L) 09/21/2017   LDLCALC 58 09/21/2017   ALT 16 05/10/2018   AST 15 05/10/2018   NA 142 08/11/2018   K 4.2 08/11/2018   CL 109 08/11/2018   CREATININE 0.97 08/11/2018   BUN 24 (H) 08/11/2018   CO2 24 08/11/2018   TSH 1.414 06/07/2018   INR 1.05 05/25/2016   HGBA1C 5.9 05/10/2018   ECHO: TEE: done 06/08/2018 FINDINGS:  LEFT VENTRICLE: EF = 50-55%. No regional wall motion abnormalities. Moderately dilated LV visually, in line with measurements on TTE yesterday.   RIGHT VENTRICLE: Normal size and function.   LEFT ATRIUM: No thrombus/mass.  LEFT ATRIAL APPENDAGE: No thrombus/mass.   RIGHT ATRIUM: No thrombus/mass.  AORTIC VALVE:  Trileaflet. Severe regurgitation. No vegetation.  MITRAL VALVE:    Normal structure. Trivial regurgitation. No vegetation.  TRICUSPID VALVE: Normal structure. No regurgitation. No vegetation.  PULMONIC VALVE: Grossly normal structure. Trivial regurgitation. No apparent vegetation.  INTERATRIAL SEPTUM: No PFO or ASD seen by color Doppler.  PERICARDIUM: No effusion noted.  DESCENDING AORTA: Mild diffuse plaque seen   CONCLUSION: Eccentric AR jet consistent with severe aortic regurgitation. 3D images and color flow attempted to further evaluate. No windsock or clear evidence of Sinus of Valsalva rupture, and no flow seen on RA/RV views to suggest SoV rupture or VSD as cause. Jet difficult to measure given  acute angle from cusp closure. Vena contracta estimated at at least 0.8 cm. Appears that there may be a prolapsed cusp as the etiology of the AI.  Patient was very sensitive to movement of the probe. He did not tolerate transgastric views. Additional views attempted for further assessment of the aortic valve, but he began coughing/gagging, with sinus tachycardia in the 150s. Blood pressure at the time was 101/39, and given wide pulse pressure, decision was made to end the case instead of administering additional sedation.  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 Meets criteria for aortic valve replacement.  Buford Dresser, MD, PhD  CATH: Diagnostic  Dominance: Right  Left Anterior Descending  Ost LAD to Prox LAD lesion 0% stenosed  Previously placed Ost LAD to Prox LAD stent (unknown type) is widely patent.  Left Circumflex  Prox Cx to Mid Cx lesion 10% stenosed  Prox Cx to Mid Cx lesion is 10% stenosed.  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  #2 known coronary occlusive disease status post drug-eluting stent 2016-on chronic Plavix #3 poor dentition not recently evaluated #4 history of tobacco use #5 traumatic injury to left forearm  The placement should be considered for aortic valve replacement and/or aortic valve repair.  He needs full evaluation of his aortic root a sending aorta, cardiac CT including aorta will be arranged.  Patient will be referred to the dental clinic as soon as possible for a white evaluation of his poor dentition prior to  consideration of valve repair or replacement.  Following review of above patient will be seen back to discuss aortic valve repair and/or replacement.  Patient( seen in office)  and his wife (by phone) have had their questions answered and are willing to proceed with further evaluation leading to surgical treatment of his aortic valve disease     I  spent 60 minutes with  the patient face to face and greater then 50% of the time was spent in counseling and coordination of care.    [name] MD      [address],[place] [address] Office [phone]   Beeper [phone]  08/17/2018 3:39 PM

## 2018-08-21 DIAGNOSIS — I351 Nonrheumatic aortic (valve) insufficiency: Secondary | ICD-10-CM

## 2018-08-21 DIAGNOSIS — Z01818 Encounter for other preprocedural examination: Secondary | ICD-10-CM

## 2018-08-22 ENCOUNTER — Other Ambulatory Visit: Payer: Self-pay

## 2018-08-22 ENCOUNTER — Encounter: Payer: Self-pay | Admitting: Cardiothoracic Surgery

## 2018-08-22 ENCOUNTER — Encounter (HOSPITAL_COMMUNITY): Payer: Self-pay | Admitting: Dentistry

## 2018-08-22 ENCOUNTER — Ambulatory Visit (HOSPITAL_COMMUNITY): Payer: Self-pay | Admitting: Dentistry

## 2018-08-22 VITALS — BP 128/61 | HR 65 | Temp 98.3°F

## 2018-08-22 DIAGNOSIS — I351 Nonrheumatic aortic (valve) insufficiency: Secondary | ICD-10-CM

## 2018-08-22 DIAGNOSIS — K036 Deposits [accretions] on teeth: Secondary | ICD-10-CM

## 2018-08-22 DIAGNOSIS — K08409 Partial loss of teeth, unspecified cause, unspecified class: Secondary | ICD-10-CM

## 2018-08-22 DIAGNOSIS — K029 Dental caries, unspecified: Secondary | ICD-10-CM

## 2018-08-22 DIAGNOSIS — K0889 Other specified disorders of teeth and supporting structures: Secondary | ICD-10-CM

## 2018-08-22 DIAGNOSIS — M264 Malocclusion, unspecified: Secondary | ICD-10-CM

## 2018-08-22 DIAGNOSIS — K06010 Localized gingival recession, unspecified: Secondary | ICD-10-CM

## 2018-08-22 DIAGNOSIS — M2632 Excessive spacing of fully erupted teeth: Secondary | ICD-10-CM

## 2018-08-22 DIAGNOSIS — K0530 Chronic periodontitis, unspecified: Secondary | ICD-10-CM

## 2018-08-22 DIAGNOSIS — Z01818 Encounter for other preprocedural examination: Secondary | ICD-10-CM

## 2018-08-22 DIAGNOSIS — K045 Chronic apical periodontitis: Secondary | ICD-10-CM

## 2018-08-22 MED ORDER — CHLORHEXIDINE GLUCONATE 0.12 % MT SOLN: OROMUCOSAL | 99 refills | Status: DC

## 2018-08-22 NOTE — Progress Notes (Signed)
DENTAL CONSULTATION  Date of Consultation:  08/22/2018 Patient Name:   Isaac Patel Date of Birth:   04/23/1965 Medical Record Number: [id number]  VITALS: BP 128/61 (BP Location: Right Arm)   Pulse 65   Temp 98.3 F (36.8 C)   CHIEF COMPLAINT: Patient referred by Dr. [name] for dental consultation.  HPI: Isaac Patel is a 53 year old male recently diagnosed with severe aortic insufficiency.  Patient with anticipated aortic valve replacement in the near future. Patient is now seen as part of a medically necessary pre-heart valve surgery dental protocol examination to rule out dental infection that may affect the patient's systemic health and anticipated heart valve surgery.  The patient currently denies acute toothaches, swellings, or abscesses. Patient was last seen in February 2017 for extraction of tooth #18. This was with Dr. Christie Nottingham. There were no complications with that dental extraction. Prior to that, the patient had not seen a dentist since he moved from [place] to [place] in 2015.  The patient does have a history of orthodontic therapy as a teenager and previous periodontal therapy. Patient denies having any partial dentures. Patient denies having dental phobia.  PROBLEM LIST: Patient Active Problem List   Diagnosis Date Noted  . Dizziness   . Aortic insufficiency 06/07/2018  . Heart murmur   . Chest pain 06/06/2018  . Neck pain 05/31/2018  . Pre-diabetes 05/10/2018  . Deviated septum 11/30/2017  . Fatty liver 07/20/2017  . Abdominal bloating 07/20/2017  . GERD with esophagitis 06/08/2016  . Essential hypertension 06/08/2016  . 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 Anxiety state 11/15/2014  . CAD (coronary artery disease) 10/30/2014  . Tobacco abuse 10/30/2014  . HLD (hyperlipidemia) 10/30/2014  . History of non-ST elevation myocardial infarction (NSTEMI) 10/28/2014  . Encounter for general adult medical examination with abnormal  findings 05/29/2014  . OSA (obstructive sleep apnea) 05/08/2014  . Cholesteatoma of ear 05/08/2014    PMH: Past Medical History:  Diagnosis Date  . CAD (coronary artery disease)    a.  Non-STEMI 8/16: LHC-proximal LAD 80% treated with a resolute DES, EF 50-55%;  b.  Echo 8/16:  Moderate LVH, EF 55-60%, normal wall motion, normal diastolic function, normal RV function  . Depression    hx  . GERD (gastroesophageal reflux disease)   . Headache    "maybe monthly" (10/28/2014)  . HLD (hyperlipidemia)   . Hypertension   . NSTEMI (non-ST elevated myocardial infarction) ([place]) 10/28/2014  . OSA (obstructive sleep apnea)    "tried mask; wear it off and on" (10/28/2014)    PSH: Past Surgical History:  Procedure Laterality Date  . 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 CARDIAC CATHETERIZATION N/A 10/29/2014   Procedure: Left Heart Cath and Coronary Angiography;  Surgeon: Troy Sine, MD;  Location: [facility];  Service: Cardiovascular;  Laterality: N/A;  . CHOLESTEATOMA EXCISION Right 1990's  . LEFT HEART CATH AND CORONARY ANGIOGRAPHY N/A 05/25/2016   Procedure: Left Heart Cath and Coronary Angiography;  Surgeon: Sherren Mocha, MD;  Location: [facility];  Service: Cardiovascular;  Laterality: N/A;  . NASAL SEPTOPLASTY W/ TURBINOPLASTY Bilateral 11/30/2017   Procedure: NASAL SEPTOPLASTY WITH TURBINATE REDUCTION;  Surgeon: Jerrell Belfast, MD;  Location: [facility];  Service: ENT;  Laterality: Bilateral;  . RIGHT/LEFT HEART CATH AND CORONARY ANGIOGRAPHY N/A 08/11/2018   Procedure: RIGHT/LEFT HEART CATH AND CORONARY ANGIOGRAPHY;  Surgeon: Burnell Blanks, MD;  Location: [facility];  Service: Cardiovascular;  Laterality: N/A;  . TEE WITHOUT CARDIOVERSION N/A 06/08/2018   Procedure: TRANSESOPHAGEAL ECHOCARDIOGRAM (TEE);  Surgeon: Buford Dresser, MD;  Location: [facility];  Service: Cardiovascular;  Laterality: N/A;  . TYMPANOSTOMY TUBE  PLACEMENT Bilateral "as a kid"   "had one done as an adult too"  . WISDOM TOOTH EXTRACTION      ALLERGIES: Allergies  Allergen Reactions  . Morphine And Related Itching    MEDICATIONS: Current Outpatient Medications  Medication Sig Dispense Refill  . acetaminophen (TYLENOL) 500 MG tablet Take 1,000 mg by mouth daily as needed for moderate pain or headache.    Marland Kitchen aspirin EC 81 MG tablet Take 81 mg by mouth daily.    Marland Kitchen atorvastatin (LIPITOR) 80 MG tablet Take 1 tablet (80 mg total) by mouth daily at 6 PM. 90 tablet 3  . furosemide (LASIX) 20 MG tablet Take 1 tablet every Monday, Wednesday and Friday. (Patient taking differently: Take 20 mg by mouth every Monday, Wednesday, and Friday. Take 1 tablet every Monday, Wednesday and Friday) 30 tablet 0  . gabapentin (NEURONTIN) 100 MG capsule Take 2 capsules (200 mg total) by mouth at bedtime. 180 capsule 1  . lisinopril (PRINIVIL,ZESTRIL) 20 MG tablet Take 1 tablet (20 mg total) by mouth daily. 90 tablet 3  . loratadine (CLARITIN) 10 MG tablet Take 10 mg by mouth daily.     . nitroGLYCERIN (NITROSTAT) 0.4 MG SL tablet Place 1 tablet (0.4 mg total) under the tongue every 5 (five) minutes x 3 doses as needed for chest pain. 25 tablet 12  . pantoprazole (PROTONIX) 40 MG tablet Take 1 tablet (40 mg total) by mouth 2 (two) times daily. 180 tablet 0  . calcium carbonate (TUMS - DOSED IN MG ELEMENTAL CALCIUM) 500 MG chewable tablet Chew 2 tablets by mouth daily as needed for indigestion or heartburn.    . cyclobenzaprine (FLEXERIL) 5 MG tablet Take 1 tablet (5 mg total) by mouth 3 (three) times daily as needed for muscle spasms. 30 tablet 0  . diclofenac sodium (VOLTAREN) 1 % GEL Apply 2 g topically 4 (four) times daily as needed (pain).     . fluticasone (FLONASE) 50 MCG/ACT nasal spray Place 1 spray into both nostrils daily as needed for allergies or rhinitis.    . Multiple Vitamin (MULTI-VITAMIN PO) Take 1 tablet by mouth daily.     No current  facility-administered medications for this visit.     LABS: Lab Results  Component Value Date   WBC 7.5 08/11/2018   HGB 12.9 (L) 08/11/2018   HCT 38.0 (L) 08/11/2018   MCV 93.0 08/11/2018   PLT 217 08/11/2018      Component Value Date/Time   NA 142 08/11/2018 0809   K 4.2 08/11/2018 0809   CL 109 08/11/2018 0538   CO2 24 08/11/2018 0538   GLUCOSE 95 08/11/2018 0538   BUN 24 (H) 08/11/2018 0538   CREATININE 0.97 08/11/2018 0538   CALCIUM 8.6 (L) 08/11/2018 0538   GFRNONAA >60 08/11/2018 0538   GFRAA >60 08/11/2018 0538   Lab Results  Component Value Date   INR 1.05 05/25/2016   INR 1.07 10/29/2014   INR 1.08 10/28/2014   No results found for: PTT  SOCIAL HISTORY: Social History   Socioeconomic History  . Marital status: Married    Spouse name: Not on file  . Number of children: 2  . Years of education: 4  . Highest education level: Not on file  Occupational History  .  Occupation: CMA  Social Needs  . Financial resource strain: Not on file  . Food insecurity:    Worry: Not on file    Inability: Not on file  . Transportation needs:    Medical: Not on file    Non-medical: Not on file  Tobacco Use  . Smoking status: Current Some Day Smoker    Packs/day: 0.33    Years: 32.00    Pack years: 10.56    Types: Cigarettes  . Smokeless tobacco: Never Used  Substance and Sexual Activity  . Alcohol use: No    Alcohol/week: 2.0 - 3.0 standard drinks    Types: 2 - 3 Standard drinks or equivalent per week    Frequency: Never    Comment: stopped 06/19/2015,   . Drug use: Yes    Types: Marijuana    Comment: 05/24/2016  "stopped marijuana in the early 2000's"  . Sexual activity: Yes  Lifestyle  . Physical activity:    Days per week: Not on file    Minutes per session: Not on file  . Stress: Not on file  Relationships  . Social connections:    Talks on phone: Not on file    Gets together: Not on file    Attends religious service: Not on file    Active member  of club or organization: Not on file    Attends meetings of clubs or organizations: Not on file    Relationship status: Not on file  . Intimate partner violence:    Fear of current or ex partner: Not on file    Emotionally abused: Not on file    Physically abused: Not on file    Forced sexual activity: Not on file  Other Topics Concern  . Not on file  Social History Narrative   Born and raised in [place], [place].  Currently resides in a house with his wife and children. 1 dog. Fun: play computer games, sports.    Denies any religious beliefs effecting health care.    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 Diverticulitis Mother   . Skin cancer Mother   . Healthy Father   . Bradycardia Father   . Multiple sclerosis Maternal Grandfather   . Colon cancer Neg Hx   . Stomach cancer Neg Hx   . Rectal cancer Neg Hx   . Liver cancer Neg Hx   . Esophageal cancer Neg Hx     REVIEW OF SYSTEMS: Reviewed with the patient as per History of present illness. Psych: Patient denies having dental phobia.  DENTAL HISTORY: CHIEF COMPLAINT: Patient referred by Dr. [name] for dental consultation.  HPI: Isaac Patel is a 53 year old male recently diagnosed with severe aortic insufficiency.  Patient with anticipated aortic valve replacement in the near future. Patient is now seen as part of a medically necessary pre-heart valve surgery dental protocol examination to rule out dental infection that may affect the patient's systemic health and anticipated heart valve surgery.  The patient currently denies acute toothaches, swellings, or abscesses. Patient was last seen in February 2017 for extraction of tooth #18. This was with Dr. Christie Nottingham. There were no complications with that dental extraction. Prior to that, the patient had not seen a dentist since he moved from [place] to [place] in 2015.  The patient does have a history of  orthodontic therapy as a teenager and previous periodontal therapy. Patient denies having any partial dentures. Patient denies having dental phobia.  DENTAL EXAMINATION: GENERAL:  The patient is a well-developed, well-nourished male in no acute distress. HEAD AND NECK:  There is no palpable neck lymphadenopathy. The patient denies acute TMJ symptoms. INTRAORAL EXAM: The patient has normal saliva. There is no evidence of oral abscess formation. DENTITION:  Patient is missing tooth numbers 1, 4, 13, 16, 17, 18, 19, 31, and 32. The spaces of the missing maxillary premolars have been closed secondary to previous orthodontic therapy as a teenager. Multiple diastemas are noted. There is evidence of excessive incisal attrition. PERIODONTAL: The patient has chronic periodontitis with plaque and calculus accumulations, generalized gingival recession, and generalized tooth mobility. There is moderate to severe bone loss noted. Radiographic calculus is noted. DENTAL CARIES/SUBOPTIMAL RESTORATIONS:  Multiple dental caries are noted as per dental charting form. ENDODONTIC:  Patient has previous root canal therapies associated with tooth numbers 3 and 30. There is persistent. periapical pathology and radiolucency associated with the apices of these teeth. CROWN AND BRIDGE: There are crowns on tooth numbers 3 and 30. There are recurrent caries on the margins of these crowns. PROSTHODONTIC:  The patient denies having partial dentures. OCCLUSION: The patient has a poor occlusal scheme secondary to multiple missing teeth, supra-eruption and drifting of the unopposed teeth into the edentulous areas, multiple diastemas, and lack of replacement missing teeth with dental prostheses.  RADIOGRAPHIC INTERPRETATION: An orthopantogram was taken today. This was supplemented with a full series of dental radiographs. There are multiple missing teeth. There is supra-eruption  and drifting of the unopposed teeth into the edentulous areas. Multiple diastemas are noted. There is moderate to severe bone loss noted. Dental caries are noted. There are previous root canal therapy associated with tooth numbers 3 and 30.  There are persistent periapical radiolucencies at the apices of tooth numbers 3 and 30.  Radiographic calculus is noted.   ASSESSMENTS: 1. Severe aortic insufficiency 2. Pre-heart valve surgery dental protocol 3. Chronic apical periodontitis 4. Dental caries 5. Chronic periodontitis with bone loss 6. Gingival recession 7. Accretions 8. Tooth mobility 9. Multiple missing teeth 10. Multiple diastemas 11. Supra-eruption and drifting of the unopposed teeth into the edentulous areas 12. Excessive incisal attrition 13.  Poor occlusal scheme and malocclusion   PLAN/RECOMMENDATIONS: 1. I discussed the risks, benefits, and complications of various treatment options with the patient in relationship to his medical and dental conditions, anticipated heart valve surgery, and risk for endocarditis. We discussed various treatment options to include no treatment, multiple extractions with alveoloplasty, pre-prosthetic surgery as indicated, periodontal therapy, dental restorations, root canal therapy, crown and bridge therapy, implant therapy, and replacement of missing teeth as indicated. We also discussed referral to an oral surgeon for dental extractions at this time.  We also discussed future referral to a periodontist for periodontal therapy.  The patient currently wishes to proceed with referral to Dr. Frederik Schmidt for evaluation for extraction of tooth numbers 3, 30 and consideration of extraction tooth numbers 23, 24, 25, and 26 prior to anticipated heart valve surgery. Dr. Raynelle Dick office will contact the patient and coordinate the dental consultation and treatment with the patient in relationship to his current work schedule.  Patient will then need to follow-up with  the general dentist of his choice for periodontal therapy, dental restorations, and evaluation for replacement of missing teeth as indicated once he is medically stable from as anticipated heart valve  surgery. In the meantime, the patient will use chlorhexidine rinses twice daily after breakfast and at bedtime for the next 6 months. The patient will require antibiotic premedication prior to invasive dental procedures after the anticipated heart valve surgery as per American Heart Association guidelines.   2. Discussion of findings with medical team and coordination of future medical and dental care as needed.  I spent in excess of  120 minutes during the conduct of this consultation and >50% of this time involved direct face-to-face encounter for counseling and/or coordination of the patient's care.    Lenn Cal, DDS

## 2018-08-22 NOTE — Patient Instructions (Signed)
[facility]    Department of Dental Medicine     DR. Kaleah Hagemeister      HEART VALVES AND MOUTH CARE:  FACTS:   If you have any infection in your mouth, it can infect your heart valve.  If you heart valve is infected, you will be seriously ill.  Infections in the mouth can be SILENT and do not always cause pain.  Examples of infections in the mouth are gum disease, dental cavities, and abscesses.  Some possible signs of infection are: Bad breath, bleeding gums, or teeth that are sensitive to sweets, hot, and/or cold. There are many other signs as well.  WHAT YOU HAVE TO DO:   Brush your teeth after meals and at bedtime. Spend at least 2 minutes brushing well, especially behind your back teeth and all around your teeth that stand alone. Brush at the gumline also.  Do not go to bed without brushing your teeth and flossing.  If you gums bleed when you brush or floss, do NOT stop brushing or flossing. It usually means that your gums need more attention and better cleaning.   If your Dentist or Dr. Radek Carnero gave you a prescription mouthwash to use, make sure to use it as directed. If you run out of the medication, get a refill at the pharmacy.   If you were given any other medications or directions by your Dentist, please follow them. If you did not understand the directions or forget what you were told, please call. We will be happy to refresh her memory.  If you need antibiotics before dental procedures, make sure you take them one hour prior to every dental visit as directed.   Get a dental checkup every 4-6 months in order to keep your mouth healthy, or to find and treat any new infection. You will most likely need your teeth cleaned or gums treated at the same time.  If you are not able to come in for your scheduled appointment, call your Dentist as soon as possible to reschedule.  If you have a problem in between dental visits, call your Dentist.  

## 2018-08-25 ENCOUNTER — Telehealth (HOSPITAL_COMMUNITY): Payer: Self-pay | Admitting: Emergency Medicine

## 2018-08-25 NOTE — Telephone Encounter (Signed)
Reaching out to patient to offer assistance regarding upcoming cardiac imaging study; pt verbalizes understanding of appt date/time, parking situation and where to check in, pre-test NPO status and medications ordered, and verified current allergies; name and call back number provided for further questions should they arise Bettie Capistran RN Navigator Cardiac Imaging [facility] [phone] office [phone] cell  Pt denies covid symptoms, verbalized understanding of visitor policy. 

## 2018-08-28 ENCOUNTER — Ambulatory Visit (HOSPITAL_COMMUNITY)
Admission: RE | Admit: 2018-08-28 | Discharge: 2018-08-28 | Disposition: A | Discharge status: Home / Self Care | Payer: 59 | Source: Ambulatory Visit | Attending: Cardiothoracic Surgery | Admitting: Cardiothoracic Surgery

## 2018-08-28 ENCOUNTER — Encounter (HOSPITAL_COMMUNITY): Payer: Self-pay

## 2018-08-28 ENCOUNTER — Other Ambulatory Visit: Payer: Self-pay

## 2018-08-28 DIAGNOSIS — I7409 Other arterial embolism and thrombosis of abdominal aorta: Secondary | ICD-10-CM | POA: Diagnosis not present

## 2018-08-28 DIAGNOSIS — I351 Nonrheumatic aortic (valve) insufficiency: Secondary | ICD-10-CM | POA: Diagnosis not present

## 2018-08-28 DIAGNOSIS — Z01818 Encounter for other preprocedural examination: Secondary | ICD-10-CM | POA: Diagnosis not present

## 2018-08-28 MED ORDER — IOHEXOL 350 MG/ML SOLN
100.0000 mL | Freq: Once | INTRAVENOUS | Status: AC | PRN
  Administered 2018-08-28: 100 mL via INTRAVENOUS

## 2018-08-29 HISTORY — PX: MULTIPLE TOOTH EXTRACTIONS: SHX2053

## 2018-08-31 ENCOUNTER — Other Ambulatory Visit: Payer: Self-pay

## 2018-08-31 ENCOUNTER — Encounter: Payer: 59 | Admitting: Cardiothoracic Surgery

## 2018-09-01 ENCOUNTER — Institutional Professional Consult (permissible substitution): Payer: 59 | Admitting: Thoracic Surgery (Cardiothoracic Vascular Surgery)

## 2018-09-01 ENCOUNTER — Encounter: Payer: Self-pay | Admitting: Thoracic Surgery (Cardiothoracic Vascular Surgery)

## 2018-09-01 ENCOUNTER — Encounter: Payer: Self-pay | Admitting: *Deleted

## 2018-09-01 ENCOUNTER — Other Ambulatory Visit: Payer: Self-pay | Admitting: *Deleted

## 2018-09-01 ENCOUNTER — Ambulatory Visit: Payer: 59 | Admitting: Cardiology

## 2018-09-01 VITALS — BP 133/71 | HR 77 | Temp 97.8°F | Resp 20 | Ht 71.0 in | Wt 205.0 lb

## 2018-09-01 DIAGNOSIS — I351 Nonrheumatic aortic (valve) insufficiency: Secondary | ICD-10-CM

## 2018-09-01 NOTE — Patient Instructions (Signed)
   Continue taking all current medications without change through the day before surgery.  Have nothing to eat or drink after midnight the night before surgery.  On the morning of surgery take only Protonix with a sip of water.    

## 2018-09-01 NOTE — H&P (View-Only) (Signed)
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  Patient's cardiac history dates back to 2016 when he presented with an acute non-ST segment elevation myocardial infarction.  He was found to have single-vessel coronary artery disease including 80% stenosis of the proximal left anterior descending coronary artery.  He was treated with PCI using drug-eluting stent.  He has done well since then and remain stable from a cardiac standpoint until recently.  He has never previously been told that he had a heart murmur and he has no reported history of rheumatic fever or rheumatic heart disease.  Several months ago the patient states that he developed relatively sudden onset of vague symptoms in his chest described as a vague tightness and a tendency to get short of breath and tired.  Symptoms ultimately prompted him to present to the emergency department in March with vague discomfort across his chest.  Cardiac enzymes were negative.  He was noted to have a murmur on physical exam and transthoracic echocardiogram revealed normal left ventricular systolic function and at least moderate or severe aortic insufficiency.  There was moderate left ventricular chamber enlargement.  Transesophageal echocardiogram  confirmed the presence of severe aortic insufficiency.  The aortic valve was notably trileaflet and without any signs of rheumatic disease.  He was seen in follow-up by cardiology and recommendations were made for the patient to undergo diagnostic cardiac catheterization.  Initially these diagnostic tests were postponed because of the ongoing COVID-19 pandemic.  Over the next several weeks the patient developed worsening symptoms of fatigue, weakness, exertional shortness of breath, and orthopnea.  He ultimately underwent catheterization on Aug 11, 2018 which revealed continued patency of the stents in the proximal left anterior descending coronary artery and no other significant coronary artery disease.  Filling pressures were normal.  The patient was referred for surgical consultation and has been evaluated previously by Dr. [name].  The patient was noted to have poor dentition and subsequently underwent dental extraction later this week.  He was referred for second surgical opinion to discuss the feasibility of aortic valve repair as an alternative to surgical valve replacement.  Patient is married and lives with his wife locally in [place].  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  He has remained reasonably active physically all of his adult life although he admits that he does not exercise on a regular basis.  He enjoys performing yard work.  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  He describes relatively sudden onset of symptoms of exertional shortness of breath, vague chest pressure, generalized weakness, and worsening fatigue.  Symptoms initially began several  months ago and have rapidly progressed over the last few weeks.  He now gets short of breath with moderate and occasionally low-level activity.  He cannot lay flat in bed.  He has had  occasional mild dizzy spells.  He reports occasional palpitations.  He has not had syncope.  He denies any fevers, chills, or other constitutional symptoms to suggest the presence of ongoing bacterial endocarditis.  He denies any recent onset cough, fever, or other symptoms to suggest COVID-19 infection.  He has been practicing social distancing and he has not been exposed to any persons with known or suspected COVID-19 infection.  Past Medical History:  Diagnosis Date   Aortic insufficiency    CAD (coronary artery disease)    a.  Non-STEMI 8/16: LHC-proximal LAD 80% treated with a resolute DES, EF 50-55%;  b.  Echo 8/16:  Moderate LVH, EF 55-60%, normal wall motion, normal diastolic function, normal RV function   Depression    hx   GERD (gastroesophageal reflux disease)    Headache    "maybe monthly" (10/28/2014)   HLD (hyperlipidemia)    Hypertension    NSTEMI (non-ST elevated myocardial infarction) ([facility]) 10/28/2014   OSA (obstructive sleep apnea)    "tried mask; wear it off and on" (10/28/2014)    Past Surgical History:  Procedure Laterality Date   ARM AMPUTATION THROUGH FOREARM Left 2009   traumatic injury   CARDIAC CATHETERIZATION N/A 10/29/2014   Procedure: Left Heart Cath and Coronary Angiography;  Surgeon: Troy Sine, MD;  Location: [facility];  Service: Cardiovascular;  Laterality: N/A;   CHOLESTEATOMA EXCISION Right 1990's   LEFT HEART CATH AND CORONARY ANGIOGRAPHY N/A 05/25/2016   Procedure: Left Heart Cath and Coronary Angiography;  Surgeon: Sherren Mocha, MD;  Location: [facility];  Service: Cardiovascular;  Laterality: N/A;   NASAL SEPTOPLASTY W/ TURBINOPLASTY Bilateral 11/30/2017   Procedure: NASAL SEPTOPLASTY WITH TURBINATE REDUCTION;  Surgeon: Jerrell Belfast, MD;  Location: [facility];  Service: ENT;  Laterality: Bilateral;   RIGHT/LEFT HEART CATH AND CORONARY ANGIOGRAPHY N/A 08/11/2018   Procedure: RIGHT/LEFT HEART CATH AND CORONARY ANGIOGRAPHY;   Surgeon: Burnell Blanks, MD;  Location: [facility];  Service: Cardiovascular;  Laterality: N/A;   TEE WITHOUT CARDIOVERSION N/A 06/08/2018   Procedure: TRANSESOPHAGEAL ECHOCARDIOGRAM (TEE);  Surgeon: Buford Dresser, MD;  Location: [facility];  Service: Cardiovascular;  Laterality: N/A;   TYMPANOSTOMY TUBE PLACEMENT Bilateral "as a kid"   "had one done as an adult too"   WISDOM TOOTH EXTRACTION      Family History  Problem Relation Age of Onset   Diverticulitis Mother    Skin cancer Mother    Healthy Father    Bradycardia Father    Multiple sclerosis Maternal Grandfather    Colon cancer Neg Hx    Stomach cancer Neg Hx    Rectal cancer Neg Hx    Liver cancer Neg Hx    Esophageal cancer Neg Hx     Social History   Socioeconomic History   Marital status: Married    Spouse name: Not on file   Number of children: 2   Years of education: 14   Highest education level: Not on file  Occupational History   Occupation: CMA  Social Needs   Financial resource strain: Not on file   Food insecurity:    Worry: Not on file    Inability: Not on file   Transportation needs:    Medical: Not on file  Non-medical: Not on file  Tobacco Use   Smoking status: Current Some Day Smoker    Packs/day: 0.33    Years: 32.00    Pack years: 10.56    Types: Cigarettes   Smokeless tobacco: Never Used  Substance and Sexual Activity   Alcohol use: No    Alcohol/week: 2.0 - 3.0 standard drinks    Types: 2 - 3 Standard drinks or equivalent per week    Frequency: Never    Comment: stopped 06/19/2015,    Drug use: Yes    Types: Marijuana    Comment: 05/24/2016  "stopped marijuana in the early 2000's"   Sexual activity: Yes  Lifestyle   Physical activity:    Days per week: Not on file    Minutes per session: Not on file   Stress: Not on file  Relationships   Social connections:    Talks on phone: Not on file    Gets together: Not on file     Attends religious service: Not on file    Active member of club or organization: Not on file    Attends meetings of clubs or organizations: Not on file    Relationship status: Not on file   Intimate partner violence:    Fear of current or ex partner: Not on file    Emotionally abused: Not on file    Physically abused: Not on file    Forced sexual activity: Not on file  Other Topics Concern   Not on file  Social History Narrative   Born and raised in [place], [place].  Currently resides in a house with his wife and children. 1 dog. Fun: play computer games, sports.    Denies any religious beliefs effecting health care.    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     aspirin EC 81 MG tablet Take 81 mg by mouth daily.     atorvastatin (LIPITOR) 80 MG tablet Take 1 tablet (80 mg total) by mouth daily at 6 PM. 90 tablet 3   calcium carbonate (TUMS - DOSED IN MG ELEMENTAL CALCIUM) 500 MG chewable tablet Chew 2 tablets by mouth daily as needed for indigestion or heartburn.     chlorhexidine (PERIDEX) 0.12 % solution Rinse with 15 mls twice daily for 30 seconds. Use after breakfast and at bedtime. Spit out excess. Do not swallow. 960 mL prn   fluticasone (FLONASE) 50 MCG/ACT nasal spray Place 1 spray into both nostrils daily as needed for allergies or rhinitis.     furosemide (LASIX) 20 MG tablet Take 1 tablet every Monday, Wednesday and Friday. 30 tablet 0   gabapentin (NEURONTIN) 100 MG capsule Take 2 capsules (200 mg total) by mouth at bedtime. 180 capsule 1   lisinopril (PRINIVIL,ZESTRIL) 20 MG tablet Take 1 tablet (20 mg total) by mouth daily. 90 tablet 3   loratadine (CLARITIN) 10 MG tablet Take 10 mg by mouth daily.      Multiple Vitamin (MULTI-VITAMIN PO) Take 1  tablet by mouth daily.     pantoprazole (PROTONIX) 40 MG tablet Take 1 tablet (40 mg total) by mouth 2 (two) times daily. 180 tablet 0   nitroGLYCERIN (NITROSTAT) 0.4 MG SL tablet Place 1 tablet (0.4 mg total) under the  tongue every 5 (five) minutes x 3 doses as needed for chest pain. (Patient not taking: Reported on 08/22/2018) 25 tablet 12   No current facility-administered medications for this visit.     Allergies  Allergen Reactions   Morphine And Related Itching      Review of Systems:   General:  normal appetite, decreased energy, no weight gain, no weight loss, no fever  Cardiac:  no chest pain with exertion, no chest pain at rest, +SOB with exertion, occasional resting SOB, + PND, + orthopnea, + palpitations, no arrhythmia, no atrial fibrillation, no LE edema, + dizzy spells, no syncope  Respiratory:  + exertional shortness of breath, no home oxygen, no productive cough, no dry cough, no bronchitis, no wheezing, no hemoptysis, no asthma, no pain with inspiration or cough, + sleep apnea, + CPAP at night  GI:   no difficulty swallowing, + reflux, no frequent heartburn, no hiatal hernia, no abdominal pain, no constipation, no diarrhea, no hematochezia, no hematemesis, no melena  GU:   no dysuria,  no frequency, no urinary tract infection, no hematuria, no enlarged prostate, no kidney stones, no kidney disease  Vascular:  no pain suggestive of claudication, no pain in feet, no leg cramps, no varicose veins, no DVT, no non-healing foot ulcer  Neuro:   no stroke, no TIA's, no seizures, no headaches, no temporary blindness one eye,  no slurred speech, + peripheral neuropathy involving right upper extremity w/ chronic intermittent numbness but relatively little pain, no other chronic pain, no instability of gait, no memory/cognitive dysfunction  Musculoskeletal: no arthritis, no joint swelling, no myalgias, no difficulty walking, normal mobility   Skin:   no rash, no itching, no skin  infections, no pressure sores or ulcerations  Psych:   no anxiety, no depression, no nervousness, no unusual recent stress  Eyes:   no blurry vision, no floaters, no recent vision changes, no wears glasses or contacts  ENT:   no hearing loss, no loose or painful teeth, no dentures, last saw dentist 2 days ago  Hematologic:  no easy bruising, no abnormal bleeding, no clotting disorder, no frequent epistaxis  Endocrine:  no diabetes, does not check CBG's at home     Physical Exam:   BP 133/71    Pulse 77    Temp 97.8 F (36.6 C) (Skin)    Resp 20    Ht 5\' 11"  (1.803 m)    Wt 205 lb (93 kg)    SpO2 93% Comment: RA   BMI 28.59 kg/m   General:    well-appearing  HEENT:  Unremarkable   Neck:   no JVD, no bruits, no adenopathy   Chest:   clear to auscultation, symmetrical breath sounds, no wheezes, no rhonchi   CV:   RRR, grade II/VI diastolic murmur   Abdomen:  soft, non-tender, no masses   Extremities:  warm, well-perfused, pulses palpable, no LE edema  Rectal/GU  Deferred  Neuro:   Grossly non-focal and symmetrical throughout  Skin:   Clean and dry, no rashes, no breakdown   Diagnostic Tests:  Transthoracic Echocardiography  Patient:    Aleksey, Newbern MR #:       [id number] Study Date: 10/30/2014 Gender:     M Age:        19 Height:     180.3 cm Weight:     82.6 kg BSA:        2.04 m^2 Pt. Status: Room:       [facility]  ORDERING     Shelva Majestic, M.D.  REFERRING    Shelva Majestic, M.D.  ADMITTING    [name], MD  ATTENDING    [name], MD  SONOGRAPHER  Leavy Cella  PERFORMING   [facility], Inpatient  cc:  ------------------------------------------------------------------- LV EF: 55% -   60%  ------------------------------------------------------------------- Indications:      Chest pain 786.51.  ------------------------------------------------------------------- History:   PMH:   Coronary artery disease.  PMH:   Myocardial infarction.  Risk factors:   Current tobacco use. Dyslipidemia.  ------------------------------------------------------------------- Study Conclusions  - Left ventricle: The cavity size was normal. There was moderate   concentric hypertrophy. Systolic function was normal. The   estimated ejection fraction was in the range of 55% to 60%. Wall   motion was normal; there were no regional wall motion   abnormalities. Left ventricular diastolic function parameters   were normal. - Aortic valve: Trileaflet; normal thickness leaflets. There was no   regurgitation. - Aortic root: The aortic root was normal in size. - Mitral valve: Structurally normal valve. There was no   regurgitation. - Right ventricle: The cavity size was normal. Wall thickness was   normal. Systolic function was normal. - Right atrium: The atrium was normal in size. - Tricuspid valve: There was no regurgitation. - Pulmonic valve: There was no regurgitation. - Pulmonary arteries: Systolic pressure couldn&'t be assessed as   there was no tricuspid regurgitation. - Inferior vena cava: The vessel was normal in size. - Pericardium, extracardiac: There was no pericardial effusion.  Impressions:  - Normal study.  Transthoracic echocardiography.  M-mode, complete 2D, spectral Doppler, and color Doppler.  Birthdate:  Patient birthdate: 03-04-1966.  Age:  Patient is 53 yr old.  Sex:  Gender: male. BMI: 25.4 kg/m^2.  Blood pressure:     145/96  Patient status: Inpatient.  Study date:  Study date: 10/30/2014. Study time: 09:16 AM.  Location:  Bedside.  -------------------------------------------------------------------  ------------------------------------------------------------------- Left ventricle:  The cavity size was normal. There was moderate concentric hypertrophy. Systolic function was normal. The estimated ejection fraction was in the range of 55% to 60%. Wall motion was normal; there were no regional wall motion abnormalities.  The transmitral flow pattern was normal. The deceleration time of the early transmitral flow velocity was normal. The pulmonary vein flow pattern was normal. The tissue Doppler parameters were normal. Left ventricular diastolic function parameters were normal.  ------------------------------------------------------------------- Aortic valve:   Trileaflet; normal thickness leaflets. Mobility was not restricted.  Doppler:  Transvalvular velocity was within the normal range. There was no stenosis. There was no regurgitation.   ------------------------------------------------------------------- Aorta:  Aortic root: The aortic root was normal in size.  ------------------------------------------------------------------- Mitral valve:   Structurally normal valve.   Mobility was not restricted.  Doppler:  Transvalvular velocity was within the normal range. There was no evidence for stenosis. There was no regurgitation.  ------------------------------------------------------------------- Left atrium:  The atrium was normal in size.  ------------------------------------------------------------------- Right ventricle:  The cavity size was normal. Wall thickness was normal. Systolic function was normal.  ------------------------------------------------------------------- Pulmonic valve:    Structurally normal valve.   Cusp separation was normal.  Doppler:  Transvalvular velocity was within the normal range. There was no evidence for stenosis. There was no regurgitation.  ------------------------------------------------------------------- Tricuspid valve:   Structurally normal valve.    Doppler: Transvalvular velocity was within the normal range. There was no regurgitation.  ------------------------------------------------------------------- Pulmonary artery:   The main pulmonary artery was normal-sized. Systolic pressure couldn&'t be assessed as  there was no  tricuspid regurgitation.  ------------------------------------------------------------------- Right atrium:  The atrium was normal in size.  ------------------------------------------------------------------- Pericardium:  There was no pericardial effusion.  ------------------------------------------------------------------- Systemic veins: Inferior vena cava: The vessel was normal in size.  ------------------------------------------------------------------- Measurements   Left ventricle                           Value        Reference  LV ID, ED, PLAX chordal                  46.5  mm     43 - 52  LV ID, ES, PLAX chordal                  31.3  mm     23 - 38  LV fx shortening, PLAX chordal           33    %      >=29  LV PW thickness, ED                      14.5  mm     ---------  IVS/LV PW ratio, ED                      0.87         <=1.3  LV ejection fraction, 1-p A4C            44    %      ---------  LV end-diastolic volume, 2-p             96    ml     ---------  LV end-systolic volume, 2-p              44    ml     ---------  LV ejection fraction, 2-p                54    %      ---------  Stroke volume, 2-p                       52    ml     ---------  LV end-diastolic volume/bsa, 2-p         47    ml/m^2 ---------  LV end-systolic volume/bsa, 2-p          22    ml/m^2 ---------  Stroke volume/bsa, 2-p                   25.5  ml/m^2 ---------  LV e&', lateral                           8.99  cm/s   ---------  LV E/e&', lateral                         6.59         ---------  LV e&', medial                            8.33  cm/s   ---------  LV E/e&', medial                          7.11         ---------  LV e&', average                           8.66  cm/s   ---------  LV E/e&', average                         6.84         ---------    Ventricular septum                       Value        Reference  IVS thickness, ED                        12.6  mm     ---------     Aorta                                    Value        Reference  Aortic root ID, ED                       38    mm     ---------    Left atrium                              Value        Reference  LA ID, A-P, ES                           39    mm     ---------  LA ID/bsa, A-P                           1.91  cm/m^2 <=2.2  LA volume, S                             38    ml     ---------  LA volume/bsa, S                         18.6  ml/m^2 ---------  LA volume, ES, 1-p A4C                   37    ml     ---------  LA volume/bsa, ES, 1-p A4C               18.1  ml/m^2 ---------  LA volume, ES, 1-p A2C                   38    ml     ---------  LA volume/bsa, ES, 1-p A2C               18.6  ml/m^2 ---------    Mitral valve                             Value        Reference  Mitral E-wave peak velocity              59.2  cm/s   ---------  Mitral A-wave peak velocity              37    cm/s   ---------  Mitral deceleration time                 225   ms     150 - 230  Mitral E/A ratio, peak                   1.6          ---------    Systemic veins                           Value        Reference  Estimated CVP                            3     mm Hg  ---------  Legend: (L)  and  (H)  mark values outside specified reference range.  ------------------------------------------------------------------- Prepared and Electronically Authenticated by  Ena Dawley, M.D. 2016-08-03T14:13:37   ECHOCARDIOGRAM REPORT       Patient Name:   [name] Date of Exam: 06/07/2018 Medical Rec #:  [id number]        Height:       71.0 in Accession #:    [id number]       Weight:       210.8 lb Date of Birth:  1965-08-16        BSA:          2.16 m Patient Age:    34 years         BP:           112/56 mmHg Patient Gender: M                HR:           79 bpm. Exam Location:  Inpatient    Procedure: 2D Echo  Indications:    786.50 chest pain   History:        Patient has prior history  of Echocardiogram examinations, most                 recent 10/30/2014. CAD; Risk Factors: Hypertension and                 Dyslipidemia. NSTEMI. OSA.   Sonographer:    Jannett Celestine RDCS (AE) Referring Phys: [id number] [place]    1. The left ventricle has normal systolic function, with an ejection fraction of 55-60%. The cavity size was moderately dilated. Left ventricular diastolic Doppler parameters are indeterminate.  2. The right ventricle has normal systolic function. The cavity was normal. There is no increase in right ventricular wall thickness. Right ventricular systolic pressure is mildly elevated with an estimated pressure of 42.2 mmHg.  3. Mild thickening of the mitral valve leaflet. Mild calcification of the mitral valve leaflet.  4. The aortic valve is abnormal Mild thickening of the aortic valve Mild calcification of the aortic valve. Aortic valve regurgitation is moderate to severe by color flow Doppler.  5. Moderate (based on pressure half time of 251 ms) to severe (based on vena contracta of 0.93 cm) aortic regurgitation. Cannot definitively determine if valve is bicuspid or tricuspid.  6. The inferior vena cava was dilated in size with <50% respiratory variability.  SUMMARY  Abnormal flow in LVOT and aortic root. On short axis images, also appears to cross into right atrium vs. out of frame turbulent flow into RCA. Similar pattern on apical views (see image 44, 57). May be Sinus of Valsalva rupture vs. eccentric moderate-severe aortic regurgitation with dilated LV (LVEDD 6.19 cm). Cannot determine if valve is bicuspid or tricuspid on these images. Recommend TEE or cardiac MRI to better assess aortic valve structur/regurgitation vs. Sinus of Valsalva rupture.  FINDINGS  Left Ventricle: The left ventricle has normal systolic function, with an ejection fraction of 55-60%. The cavity size was moderately dilated. There is no increase in left ventricular  wall thickness. Left ventricular diastolic Doppler parameters are  indeterminate. Right Ventricle: The right ventricle has normal systolic function. The cavity was normal. There is no increase in right ventricular wall thickness. Right ventricular systolic pressure is mildly elevated with an estimated pressure of 42.2 mmHg. Left Atrium: left atrial size was normal in size Right Atrium: right atrial size was normal in size. Right atrial pressure is estimated at 15 mmHg. Interatrial Septum: No atrial level shunt detected by color flow Doppler. Pericardium: There is no evidence of pericardial effusion. Mitral Valve: The mitral valve is normal in structure. Mild thickening of the mitral valve leaflet. Mild calcification of the mitral valve leaflet. Mitral valve regurgitation is trivial by color flow Doppler. Tricuspid Valve: The tricuspid valve is normal in structure. Tricuspid valve regurgitation was not visualized by color flow Doppler. Aortic Valve: The aortic valve is abnormal Mild thickening of the aortic valve Mild calcification of the aortic valve. Aortic valve regurgitation is moderate to severe by color flow Doppler. There is no evidence of aortic valve stenosis. Moderate (based  on pressure half time of 251 ms) to severe (based on vena contracta of 0.93 cm) aortic regurgitation. Cannot definitively determine if valve is bicuspid or tricuspid. Pulmonic Valve: The pulmonic valve was grossly normal. Pulmonic valve regurgitation is trivial by color flow Doppler. Venous: The inferior vena cava is dilated in size with less than 50% respiratory variability.   LEFT VENTRICLE PLAX 2D LVIDd:         6.19 cm  Diastology LVIDs:         4.02 cm  LV e' lateral:   11.50 cm/s LV PW:         1.07 cm  LV E/e' lateral: 6.8 LV IVS:        1.05 cm  LV e' medial:    7.60 cm/s LVOT diam:     2.40 cm  LV E/e' medial:  10.3 LV SV:         122 ml LV SV Index:   55.41 LVOT Area:     4.52 cm  RIGHT  VENTRICLE RV Basal diam:  4.17 cm RV S prime:     17.30 cm/s TAPSE (M-mode): 2.5 cm RVSP:           42.2 mmHg  LEFT ATRIUM             Index       RIGHT ATRIUM           Index LA diam:        4.00 cm 1.86 cm/m  RA Pressure: 15 mmHg LA Vol (A2C):   64.8 ml 30.06 ml/m RA Area:     17.20 cm LA Vol (A4C):   55.2 ml 25.60 ml/m RA Volume:   36.60 ml  16.98 ml/m LA Biplane Vol: 63.8 ml 29.59 ml/m  AORTIC VALVE LVOT Vmax:   89.40 cm/s LVOT Vmean:  65.100 cm/s LVOT VTI:    0.215 m   AORTA Ao Root diam: 3.30 cm  MITRAL VALVE              TRICUSPID VALVE MV Area (PHT): 3.77 cm   TR Peak grad:   27.2 mmHg MV PHT:        58.29 msec TR Vmax:        261.00 cm/s MV Decel Time: 201 msec   RVSP:           42.2 mmHg MV E velocity: 78.10 cm/s MV A velocity: 43.70 cm/s SHUNTS MV E/A ratio:  1.79       Systemic VTI:  0.22 m                           Systemic Diam: 2.40 cm    Buford Dresser MD Electronically signed by Buford Dresser MD Signature Date/Time: 06/07/2018/2:56:26 PM     TRANSESOPHOGEAL ECHO REPORT       Patient Name:   [name] Date of Exam: 06/08/2018 Medical Rec #:  [id number]        Height:       71.0 in Accession #:    [id number]       Weight:       210.8 lb Date of Birth:  1965/06/01        BSA:          2.16 m Patient Age:    68 years         BP:           113/78 mmHg Patient Gender: M                HR:           73 bpm. Exam Location:  Inpatient    Procedure: 2D Echo  Indications:     aortic regurgitation evaluation. chest pain 786.50   History:         Patient has prior history of Echocardiogram examinations, most                  recent 06/07/2018. Risk Factors: Hypertension and Dyslipidemia.   Sonographer:     Jannett Celestine RDCS (AE) Referring Phys:  [id number] PETER M [place] Diagnosing Phys: Buford Dresser MD     PROCEDURE: No source of intracardiac mass. No evidence of intracardiac thrombus. No evidence of vegetation.  Consent was requested emergently by emergency room physicain. After discussion of the risks and benefits of a TEE, an informed consent was  obtained from the patient. Local oropharyngeal anesthetic was provided with cetacaine. Patients was under conscious sedation during this procedure. Anesthetic was administered intravenously by performing Physician: 38mcg of Fentanyl, 4.0mg  of Versed. The  transesophogeal probe was passed through the esophogus of the patient. Imaged were obtained with the patient in a left lateral decubitus position. Image quality was good. The patient's vital signs; including heart rate, blood pressure, and oxygen  saturation; remained stable throughout the procedure. The patient developed no complications during the procedure.  IMPRESSIONS    1. The left ventricle has low normal systolic function, with an ejection fraction of 50-55%. The cavity size was moderately dilated. No evidence of left ventricular regional wall motion abnormalities.  2. The right ventricle has normal systolc function. The cavity was normal. There is no increase in right ventricular wall thickness.  3. No  evidence of mitral valve stenosis. No mitral valve vegetation visualized.  4. The aortic valve is tricuspid Aortic valve regurgitation is severe by color flow Doppler.  5. There is evidence of mild plaque in the descending aorta.  SUMMARY   Eccentric AR jet consistent with severe aortic regurgitation. 3D images and color flow attempted to further evaluate. No windsock or clear evidence of Sinus of Valsalva rupture, and no flow seen on RA/RV views to suggest SoV rupture or VSD as cause. Jet difficult to measure given acute angle from cusp closure. Vena contracta estimated at at least 0.8 cm. Appears that there may be a prolapsed cusp as the etiology of the AI.   Patient was very sensitive to movement of the probe. He did not tolerate transgastric views. Additional views attempted for  further assessment of the aortic valve, but he began coughing/gagging, with sinus tachycardia in the 150s. Blood pressure at the time was 101/39, and given wide pulse pressure, decision was made to end the case instead of administering additional sedation.   TTE and TEE evidence together suggests severe eccentric AR, possibly due to prolapsed coronary cusp, with moderately dilated LV and borderline EF. Meets criteria for aortic valve replacement.  FINDINGS  Left Ventricle: The left ventricle has low normal systolic function, with an ejection fraction of 50-55%. The cavity size was moderately dilated. There is no increase in left ventricular wall thickness. No evidence of left ventricular regional wall  motion abnormalities. Right Ventricle: The right ventricle has normal systolic function. The cavity was normal. There is no increase in right ventricular wall thickness. Left Atrium: Left atrial size was normal in size. Right Atrium: Right atrial size was normal in size. Right atrial pressure is estimated at 10 mmHg. Interatrial Septum: No atrial level shunt detected by color flow Doppler. Pericardium: There is no evidence of pericardial effusion. Mitral Valve: The mitral valve is normal in structure. Mitral valve regurgitation is trivial by color flow Doppler. No evidence of mitral valve stenosis. There is no evidence of mitral valve vegetation. Tricuspid Valve: The tricuspid valve was normal in structure. Tricuspid valve regurgitation was not visualized by color flow Doppler. No TV vegetation was visualized. Aortic Valve: The aortic valve is tricuspid Aortic valve regurgitation is severe by color flow Doppler. There is no evidence of aortic valve stenosis. There is no evidence of a vegetation on the aortic valve. Pulmonic Valve: The pulmonic valve was normal in structure. Pulmonic valve regurgitation is trivial by color flow Doppler. No evidence of pulmonic stenosis. Aorta: There is evidence of  mild plaque in the descending aorta. Venous: The inferior vena cava is normal in size with greater than 50% respiratory variability.   LV Wall Scoring:  RIGHT ATRIUM RA Pressure: 10 mmHg    Buford Dresser MD Electronically signed by Buford Dresser MD Signature Date/Time: 06/09/2018/7:57:21 AM     RIGHT/LEFT HEART CATH AND CORONARY ANGIOGRAPHY  Conclusion     Previously placed Ost LAD to Prox LAD stent (unknown type) is widely patent.  Prox Cx to Mid Cx lesion is 10% stenosed.   1. Patent proximal LAD stent 2. Normal filling pressures    Recommendations   Antiplatelet/Anticoag Medical management of CAD. Proceed with workup for aortic valve replacement.  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   Procedure: The risks, benefits, complications, treatment options, and  expected outcomes were discussed with the patient. The patient and/or family concurred with the proposed plan, giving informed consent. The patient was brought to the cath lab after IV hydration was given. The patient was sedated with Versed and Fentanyl. I changed out the right antecubital vein IV catheter for a 5 French sheath. Right heart catheterization performed with a balloon tipped catheter. The right groin was prepped and draped in the usual manner. Using the modified Seldinger access technique, a 5 French sheath was placed in the right femoral artery using u/s guidance. Standard diagnostic catheters were used to perform selective coronary angiography. LV pressures measured with a JR4 catheter.   There were no immediate complications. The patient was taken to the recovery area in stable condition.   Estimated blood loss <50 mL.   During this procedure medications were administered to achieve and maintain moderate  conscious sedation while the patient's heart rate, blood pressure, and oxygen saturation were continuously monitored and I was present face-to-face 100% of this time.  Medications  (Filter: Administrations occurring from 08/11/18 0726 to 08/11/18 3810)  Medication Rate/Dose/Volume Action  Date Time   midazolam (VERSED) injection (mg) 1 mg Given 08/11/18 0740   Total dose as of 09/01/18 1240 1 mg Given 0804   2 mg        fentaNYL (SUBLIMAZE) injection (mcg) 25 mcg Given 08/11/18 0740   Total dose as of 09/01/18 1240 25 mcg Given 0804   50 mcg        lidocaine (PF) (XYLOCAINE) 1 % injection (mL) 2 mL Given 08/11/18 0753   Total dose as of 09/01/18 1240 15 mL Given 0801   17 mL        Heparin (Porcine) in NaCl 1000-0.9 UT/500ML-% SOLN (mL) 500 mL Given 08/11/18 0809   Total dose as of 09/01/18 1240 500 mL Given 0809   1,000 mL        iohexol (OMNIPAQUE) 350 MG/ML injection (mL) 55 mL Given 08/11/18 0820   Total dose as of 09/01/18 1240        55 mL        Sedation Time   Sedation Time Physician-1: 35 minutes 34 seconds  Complications   Complications documented before study signed (08/11/2018 8:31 AM EDT)    RIGHT/LEFT HEART CATH AND CORONARY ANGIOGRAPHY   None Documented by Burnell Blanks, MD 08/11/2018 8:30 AM EDT  Time Range: Intraprocedure      Coronary Findings   Diagnostic  Dominance: Right  Left Anterior Descending  Ost LAD to Prox LAD lesion 0% stenosed  Previously placed Ost LAD to Prox LAD stent (unknown type) is widely patent.  Left Circumflex  Prox Cx to Mid Cx lesion 10% stenosed  Prox Cx to Mid Cx lesion is 10% stenosed.  Intervention   No interventions have been documented.  Coronary Diagrams   Diagnostic  Dominance: Right    Intervention   Implants    No implant documentation for this case.  Syngo Images   Show images for CARDIAC CATHETERIZATION  Images on Long Term Storage   Show images for Aqil, Goetting "Yvone Neu"   Link to  Procedure Log   Procedure Log    Hemo Data    Most Recent Value  Fick Cardiac Output 4.79 L/min  Fick Cardiac Output Index 2.25 (L/min)/BSA  Aortic Mean Gradient 4.6 mmHg  Aortic Peak Gradient 0 mmHg  Aortic Valve Area >3.50  Aortic Value Area Index 1.64 cm2/BSA  RA A Wave 5 mmHg  RA V Wave 3 mmHg  RA Mean 3 mmHg  RV Systolic Pressure 25 mmHg  RV Diastolic Pressure 1 mmHg  RV EDP 6 mmHg  PA Systolic Pressure 21 mmHg  PA Diastolic Pressure 5 mmHg  PA Mean 12 mmHg  PW A Wave 16 mmHg  PW V Wave 12 mmHg  PW Mean 9 mmHg  AO Systolic Pressure 431 mmHg  AO Diastolic Pressure 55 mmHg  AO Mean 78 mmHg  LV Systolic Pressure 540 mmHg  LV Diastolic Pressure 4 mmHg  LV EDP 15 mmHg  AOp Systolic Pressure 086 mmHg  AOp Diastolic Pressure 53 mmHg  AOp Mean Pressure 81 mmHg  LVp Systolic Pressure 761 mmHg  LVp Diastolic Pressure 4 mmHg  LVp EDP Pressure 13 mmHg  QP/QS 1  TPVR Index 5.34 HRUI  TSVR Index 34.7 HRUI  PVR SVR Ratio 0.04  TPVR/TSVR Ratio 0.15      Cardiac CTA  MEDICATIONS: Sub lingual nitro. 4mg  and lopressor 50mg  PO  TECHNIQUE: The patient was scanned on a Siemens Force 950 slice scanner. Gantry rotation speed was 250 msecs. Collimation was .6 mm. A 100 kV prospective scan was triggered in the ascending thoracic aorta at 140 HU's Full mA was used between 35% and 75% of the R-R interval. Average HR during the scan was 58 bpm. The 3D data set was interpreted on a dedicated work station using MPR, MIP and VRT modes. A total of 80 cc of contrast was used.  FINDINGS: Non-cardiac: See separate report from [facility]. No significant findings on limited lung and soft tissue windows.  Calcium Score: Not performed due to coronary stent  Coronary Arteries: Right dominant with no anomalies: No significant obstructive disease in RCA/Circumflex Patent stent in the proximal LAD  Aortic Valve: The aortic valve is tri leaflet. There appears to  be prolapse of the right coronary cusp.  Aorta: Normal size. Normal arch vessels No coarctation and normal descending thoracic aorta  Sinus: Largest diameter left sinus 4.0 cm  STJ: 2.9 cm  Ascending aortic root 3.3 cm  Descending Thoracic Aorta: 2.6 cm  IMPRESSION: 1. Normal aortic root and arch vessels with no coarctation Ascending aortic root 3.3 cm  2. Patent proximal LAD stent with no significant obstructive disease in RCA/Circumflex  3. Tri- leaflet AV with likely prolapse of the right coronary cusp as etiology of severe AR  Jenkins Rouge  Electronically Signed: By: Jenkins Rouge M.D. On: 08/28/2018 16:15    CT ANGIOGRAPHY CHEST, ABDOMEN AND PELVIS  TECHNIQUE: Multidetector CT imaging through the chest, abdomen and pelvis was performed using the standard protocol during bolus administration of intravenous contrast. Multiplanar reconstructed images and MIPs were obtained and reviewed to evaluate the vascular anatomy.  CONTRAST:  162mL OMNIPAQUE IOHEXOL 350 MG/ML SOLN  COMPARISON:  CT the abdomen and pelvis 06/08/2018. No prior chest CT.  FINDINGS: CTA CHEST FINDINGS  Cardiovascular: Heart size is normal. There is no significant pericardial fluid, thickening or pericardial calcification. Left anterior descending coronary artery stent. No atherosclerotic calcifications in the thoracic aorta.  Mediastinum/Lymph Nodes: No pathologically enlarged mediastinal or hilar lymph nodes. Esophagus is unremarkable in appearance. No axillary lymphadenopathy.  Lungs/Pleura: No suspicious appearing pulmonary nodules or masses. No acute consolidative airspace disease. No pleural effusions.  Musculoskeletal/Soft Tissues: There are no aggressive appearing lytic or blastic lesions noted in the visualized portions of the skeleton.  CTA ABDOMEN AND PELVIS FINDINGS  Hepatobiliary: 2 small hypervascular areas are noted in the liver, measuring 7 mm  in  segment 5 and 5 mm in segment 8 (axial images 101 of series 17 and 87 of series 17 respectively). These are incompletely characterized on today's examination, but favored to represent perfusion anomalies. No other definite suspicious cystic or solid hepatic lesions. No intra or extrahepatic biliary ductal dilatation. Gallbladder is normal in appearance.  Pancreas: No pancreatic mass. No pancreatic ductal dilatation. No pancreatic or peripancreatic fluid or inflammatory changes.  Spleen: Unremarkable.  Adrenals/Urinary Tract: Bilateral kidneys and adrenal glands are normal in appearance. No hydroureteronephrosis. Urinary bladder is normal in appearance.  Stomach/Bowel: Normal appearance of the stomach. No pathologic dilatation of small bowel or colon. A few scattered colonic diverticulae are noted, without surrounding inflammatory changes to suggest an acute diverticulitis at this time. Normal appendix.  Vascular/Lymphatic: Aortic atherosclerosis, without evidence of aneurysm or dissection in the abdominal or pelvic vasculature. No lymphadenopathy noted in the abdomen or pelvis.  Reproductive: Prostate gland and seminal vesicles are unremarkable in appearance.  Other: No significant volume of ascites.  No pneumoperitoneum.  Musculoskeletal: There are no aggressive appearing lytic or blastic lesions noted in the visualized portions of the skeleton.  VASCULAR MEASUREMENTS PERTINENT TO TAVR:  AORTA:  Minimal Aortic Diameter-16 x 16 mm  Severity of Aortic Calcification-mild  RIGHT PELVIS:  Right Common Iliac Artery -  Minimal Diameter-10.1 x 9.9 mm  Tortuosity-moderate  Calcification-moderate  Right External Iliac Artery -  Minimal Diameter-7.8 x 7.0 mm  Tortuosity-mild  Calcification-mild  Right Common Femoral Artery -  Minimal Diameter-9.4 x 8.2 mm  Tortuosity - mild  Calcification-mild  LEFT PELVIS:  Left Common Iliac  Artery -  Minimal Diameter-10.7 x 8.8 mm  Tortuosity - mild  Calcification-moderate  Left External Iliac Artery -  Minimal Diameter-8.6 x 8.1 mm  Tortuosity - mild  Calcification-mild  Left Common Femoral Artery -  Minimal Diameter-9.5 x 9.5 mm  Tortuosity - mild  Calcification-mild  Review of the MIP images confirms the above findings.  IMPRESSION: 1. Vascular findings and measurements pertinent to potential TAVR procedure, as detailed above. 2. Small hypervascular lesions in the liver, as discussed above. These are incompletely characterized on today's arterial phase examination, however, these are strongly favored to represent benign perfusion anomalies. If of clinical concern, these could be definitively characterized with MRI of the abdomen with and without IV gadolinium. 3. Mild colonic diverticulosis without evidence to suggest an acute diverticulitis at this time.   Electronically Signed   By: Vinnie Langton M.D.   On: 08/29/2018 09:10    EKG: NSR w/out significant AV conduction delay    Impression:  Patient has stage D severe symptomatic aortic insufficiency.  He presents with fairly rapid progression of symptoms over the last few months consistent with acute diastolic congestive heart failure, currently [place] Heart Association functional class III.  I have personally reviewed the patient's recent echocardiograms, diagnostic cardiac catheterization, and CT angiograms.  His aortic valve is trileaflet with isolated prolapse involving the right coronary cusp.  There are no obvious perforations or other signs to suggest history of endocarditis.  There is no significant leaflet fibrosis or thickening to suggest underlying rheumatic disease.  There is moderate left ventricular chamber enlargement but otherwise normal left ventricular systolic function.  Diagnostic cardiac catheterization is notable for the absence of significant coronary  artery disease and right heart pressures were normal.  Options include the possibility of elective aortic valve repair versus replacement.  I feel the patient would be relatively good candidate for an attempt  at valve repair.   Plan:  The patient and his wife were counseled at length regarding treatment alternatives for management of severe aortic insufficiency including continued medical therapy versus proceeding with aortic valve repair or replacement in the near future.  The natural history of aortic insufficiency was reviewed, as was long term prognosis with medical therapy alone.  Surgical options were discussed at length including an extensive discussion comparing the risks and benefits of valve repair and valve replacement.  Questions about long-term durability of successful valve repair were discussed including review of our local experience and experience in the published literature.  I explained that for an attempt at valve repair the patient would require conventional median sternotomy.  Alternatively, aortic valve replacement could be performed safely through either a full median sternotomy or using minimally invasive techniques.  He understands that transcatheter aortic valve replacement is not currently an option for patients with pure aortic insufficiency.  If the patient's valve is not repairable, discussion was held comparing the relative risks of mechanical valve replacement with need for lifelong anticoagulation versus use of a bioprosthetic tissue valve and the associated potential for late structural valve deterioration and failure.  This discussion was placed in the context of the patient's particular circumstances, and as a result the patient specifically requests that if his valve cannot be repaired he request that it be replaced using a bioprosthetic tissue valve.   The patient understands and accepts all potential associated risks of surgery including but not limited to risk of death,  stroke, myocardial infarction, congestive heart failure, respiratory failure, renal failure, pneumonia, bleeding requiring blood transfusion and or reexploration, arrhythmia, heart block or bradycardia requiring permanent pacemaker, aortic dissection or other major vascular complication, pleural effusions or other delayed complications related to continued congestive heart failure, and other late complications related to valve repair or replacement including structural valve deterioration and failure, thrombosis, endocarditis, or paravalvular leak.  We plan to proceed with surgery on September 15, 2018.     I spent in excess of 90 minutes during the conduct of this office consultation and >50% of this time involved direct face-to-face encounter with the patient for counseling and/or coordination of their care.   Valentina Gu. Roxy Manns, MD 09/01/2018 10:52 AM

## 2018-09-01 NOTE — Progress Notes (Signed)
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  Patient's cardiac history dates back to 2016 when he presented with an acute non-ST segment elevation myocardial infarction.  He was found to have single-vessel coronary artery disease including 80% stenosis of the proximal left anterior descending coronary artery.  He was treated with PCI using drug-eluting stent.  He has done well since then and remain stable from a cardiac standpoint until recently.  He has never previously been told that he had a heart murmur and he has no reported history of rheumatic fever or rheumatic heart disease.  Several months ago the patient states that he developed relatively sudden onset of vague symptoms in his chest described as a vague tightness and a tendency to get short of breath and tired.  Symptoms ultimately prompted Patel to present to the emergency department in March with vague discomfort across his chest.  Cardiac enzymes were negative.  He was noted to have a murmur on physical exam and transthoracic echocardiogram revealed normal left ventricular systolic function and at least moderate or severe aortic insufficiency.  There was moderate left ventricular chamber enlargement.  Transesophageal echocardiogram  confirmed the presence of severe aortic insufficiency.  The aortic valve was notably trileaflet and without any signs of rheumatic disease.  He was seen in follow-up by cardiology and recommendations were made for the patient to undergo diagnostic cardiac catheterization.  Initially these diagnostic tests were postponed because of the ongoing COVID-19 pandemic.  Over the next several weeks the patient developed worsening symptoms of fatigue, weakness, exertional shortness of breath, and orthopnea.  He ultimately underwent catheterization on Aug 11, 2018 which revealed continued patency of the stents in the proximal left anterior descending coronary artery and no other significant coronary artery disease.  Filling pressures were normal.  The patient was referred for surgical consultation and has been evaluated previously by Dr. Servando Patel.  The patient was noted to have poor dentition and subsequently underwent dental extraction later this week.  He was referred for second surgical opinion to discuss the feasibility of aortic valve repair as an alternative to surgical valve replacement.  Patient is married and lives with his wife locally in [place].  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  He has remained reasonably active physically all of his adult life although he admits that he does not exercise on a regular basis.  He enjoys performing yard work.  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  He describes relatively sudden onset of symptoms of exertional shortness of breath, vague chest pressure, generalized weakness, and worsening fatigue.  Symptoms initially began several  months ago and have rapidly progressed over the last few weeks.  He now gets short of breath with moderate and occasionally low-level activity.  He cannot lay flat in bed.  He has had  occasional mild dizzy spells.  He reports occasional palpitations.  He has not had syncope.  He denies any fevers, chills, or other constitutional symptoms to suggest the presence of ongoing bacterial endocarditis.  He denies any recent onset cough, fever, or other symptoms to suggest COVID-19 infection.  He has been practicing social distancing and he has not been exposed to any persons with known or suspected COVID-19 infection.  Past Medical History:  Diagnosis Date   Aortic insufficiency    CAD (coronary artery disease)    a.  Non-STEMI 8/16: LHC-proximal LAD 80% treated with a resolute DES, EF 50-55%;  b.  Echo 8/16:  Moderate LVH, EF 55-60%, normal wall motion, normal diastolic function, normal RV function   Depression    hx   GERD (gastroesophageal reflux disease)    Headache    "maybe monthly" (10/28/2014)   HLD (hyperlipidemia)    Hypertension    NSTEMI (non-ST elevated myocardial infarction) ([place]) 10/28/2014   OSA (obstructive sleep apnea)    "tried mask; wear it off and on" (10/28/2014)    Past Surgical History:  Procedure Laterality Date   ARM AMPUTATION THROUGH FOREARM Left 2009   traumatic injury   CARDIAC CATHETERIZATION N/A 10/29/2014   Procedure: Left Heart Cath and Coronary Angiography;  Surgeon: Isaac Sine, MD;  Location: [facility];  Service: Cardiovascular;  Laterality: N/A;   CHOLESTEATOMA EXCISION Right 1990's   LEFT HEART CATH AND CORONARY ANGIOGRAPHY N/A 05/25/2016   Procedure: Left Heart Cath and Coronary Angiography;  Surgeon: Patel Mocha, MD;  Location: [facility];  Service: Cardiovascular;  Laterality: N/A;   NASAL SEPTOPLASTY W/ TURBINOPLASTY Bilateral 11/30/2017   Procedure: NASAL SEPTOPLASTY WITH TURBINATE REDUCTION;  Surgeon: Patel Belfast, MD;  Location: [facility];  Service: ENT;  Laterality: Bilateral;   RIGHT/LEFT HEART CATH AND CORONARY ANGIOGRAPHY N/A 08/11/2018   Procedure: RIGHT/LEFT HEART CATH AND CORONARY ANGIOGRAPHY;   Surgeon: Patel Blanks, MD;  Location: [facility];  Service: Cardiovascular;  Laterality: N/A;   TEE WITHOUT CARDIOVERSION N/A 06/08/2018   Procedure: TRANSESOPHAGEAL ECHOCARDIOGRAM (TEE);  Surgeon: Buford Dresser, MD;  Location: [facility];  Service: Cardiovascular;  Laterality: N/A;   TYMPANOSTOMY TUBE PLACEMENT Bilateral "as a kid"   "had one done as an adult too"   WISDOM TOOTH EXTRACTION      Family History  Problem Relation Age of Onset   Diverticulitis Mother    Skin cancer Mother    Healthy Father    Bradycardia Father    Multiple sclerosis Maternal Grandfather    Colon cancer Neg Hx    Stomach cancer Neg Hx    Rectal cancer Neg Hx    Liver cancer Neg Hx    Esophageal cancer Neg Hx     Social History   Socioeconomic History   Marital status: Married    Spouse name: Not on file   Number of children: 2   Years of education: 14   Highest education level: Not on file  Occupational History   Occupation: CMA  Social Needs   Financial resource strain: Not on file   Food insecurity:    Worry: Not on file    Inability: Not on file   Transportation needs:    Medical: Not on file  Non-medical: Not on file  Tobacco Use   Smoking status: Current Some Day Smoker    Packs/day: 0.33    Years: 32.00    Pack years: 10.56    Types: Cigarettes   Smokeless tobacco: Never Used  Substance and Sexual Activity   Alcohol use: No    Alcohol/week: 2.0 - 3.0 standard drinks    Types: 2 - 3 Standard drinks or equivalent per week    Frequency: Never    Comment: stopped 06/19/2015,    Drug use: Yes    Types: Marijuana    Comment: 05/24/2016  "stopped marijuana in the early 2000's"   Sexual activity: Yes  Lifestyle   Physical activity:    Days per week: Not on file    Minutes per session: Not on file   Stress: Not on file  Relationships   Social connections:    Talks on phone: Not on file    Gets together: Not on file     Attends religious service: Not on file    Active member of club or organization: Not on file    Attends meetings of clubs or organizations: Not on file    Relationship status: Not on file   Intimate partner violence:    Fear of current or ex partner: Not on file    Emotionally abused: Not on file    Physically abused: Not on file    Forced sexual activity: Not on file  Other Topics Concern   Not on file  Social History Narrative   Born and raised in [place], [place].  Currently resides in a house with his wife and children. 1 dog. Fun: play computer games, sports.    Denies any religious beliefs effecting health care.    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     aspirin EC 81 MG tablet Take 81 mg by mouth daily.     atorvastatin (LIPITOR) 80 MG tablet Take 1 tablet (80 mg total) by mouth daily at 6 PM. 90 tablet 3   calcium carbonate (TUMS - DOSED IN MG ELEMENTAL CALCIUM) 500 MG chewable tablet Chew 2 tablets by mouth daily as needed for indigestion or heartburn.     chlorhexidine (PERIDEX) 0.12 % solution Rinse with 15 mls twice daily for 30 seconds. Use after breakfast and at bedtime. Spit out excess. Do not swallow. 960 mL prn   fluticasone (FLONASE) 50 MCG/ACT nasal spray Place 1 spray into both nostrils daily as needed for allergies or rhinitis.     furosemide (LASIX) 20 MG tablet Take 1 tablet every Monday, Wednesday and Friday. 30 tablet 0   gabapentin (NEURONTIN) 100 MG capsule Take 2 capsules (200 mg total) by mouth at bedtime. 180 capsule 1   lisinopril (PRINIVIL,ZESTRIL) 20 MG tablet Take 1 tablet (20 mg total) by mouth daily. 90 tablet 3   loratadine (CLARITIN) 10 MG tablet Take 10 mg by mouth daily.      Multiple Vitamin (MULTI-VITAMIN PO) Take 1  tablet by mouth daily.     pantoprazole (PROTONIX) 40 MG tablet Take 1 tablet (40 mg total) by mouth 2 (two) times daily. 180 tablet 0   nitroGLYCERIN (NITROSTAT) 0.4 MG SL tablet Place 1 tablet (0.4 mg total) under the  tongue every 5 (five) minutes x 3 doses as needed for chest pain. (Patient not taking: Reported on 08/22/2018) 25 tablet 12   No current facility-administered medications for this visit.     Allergies  Allergen Reactions   Morphine And Related Itching      Review of Systems:   General:  normal appetite, decreased energy, no weight gain, no weight loss, no fever  Cardiac:  no chest pain with exertion, no chest pain at rest, +SOB with exertion, occasional resting SOB, + PND, + orthopnea, + palpitations, no arrhythmia, no atrial fibrillation, no LE edema, + dizzy spells, no syncope  Respiratory:  + exertional shortness of breath, no home oxygen, no productive cough, no dry cough, no bronchitis, no wheezing, no hemoptysis, no asthma, no pain with inspiration or cough, + sleep apnea, + CPAP at night  GI:   no difficulty swallowing, + reflux, no frequent heartburn, no hiatal hernia, no abdominal pain, no constipation, no diarrhea, no hematochezia, no hematemesis, no melena  GU:   no dysuria,  no frequency, no urinary tract infection, no hematuria, no enlarged prostate, no kidney stones, no kidney disease  Vascular:  no pain suggestive of claudication, no pain in feet, no leg cramps, no varicose veins, no DVT, no non-healing foot ulcer  Neuro:   no stroke, no TIA's, no seizures, no headaches, no temporary blindness one eye,  no slurred speech, + peripheral neuropathy involving right upper extremity w/ chronic intermittent numbness but relatively little pain, no other chronic pain, no instability of gait, no memory/cognitive dysfunction  Musculoskeletal: no arthritis, no joint swelling, no myalgias, no difficulty walking, normal mobility   Skin:   no rash, no itching, no skin  infections, no pressure sores or ulcerations  Psych:   no anxiety, no depression, no nervousness, no unusual recent stress  Eyes:   no blurry vision, no floaters, no recent vision changes, no wears glasses or contacts  ENT:   no hearing loss, no loose or painful teeth, no dentures, last saw dentist 2 days ago  Hematologic:  no easy bruising, no abnormal bleeding, no clotting disorder, no frequent epistaxis  Endocrine:  no diabetes, does not check CBG's at home     Physical Exam:   BP 133/71    Pulse 77    Temp 97.8 F (36.6 C) (Skin)    Resp 20    Ht 5\' 11"  (1.803 m)    Wt 205 lb (93 kg)    SpO2 93% Comment: RA   BMI 28.59 kg/m   General:    well-appearing  HEENT:  Unremarkable   Neck:   no JVD, no bruits, no adenopathy   Chest:   clear to auscultation, symmetrical breath sounds, no wheezes, no rhonchi   CV:   RRR, grade II/VI diastolic murmur   Abdomen:  soft, non-tender, no masses   Extremities:  warm, well-perfused, pulses palpable, no LE edema  Rectal/GU  Deferred  Neuro:   Grossly non-focal and symmetrical throughout  Skin:   Clean and dry, no rashes, no breakdown   Diagnostic Tests:  Transthoracic Echocardiography  Patient:    Patel Patel, Patel Patel MR #:       [id number] Study Date: 10/30/2014 Gender:     M Age:        36 Height:     180.3 cm Weight:     82.6 kg BSA:        2.04 m^2 Pt. Status: Room:       [facility]  ORDERING     Shelva Majestic, M.D.  REFERRING    Shelva Majestic, M.D.  ADMITTING    Patel Him, MD  ATTENDING    Patel Him, MD  SONOGRAPHER  Leavy Cella  PERFORMING   [facility], Inpatient  cc:  ------------------------------------------------------------------- LV EF: 55% -   60%  ------------------------------------------------------------------- Indications:      Chest pain 786.51.  ------------------------------------------------------------------- History:   PMH:   Coronary artery disease.  PMH:   Myocardial infarction.  Risk factors:   Current tobacco use. Dyslipidemia.  ------------------------------------------------------------------- Study Conclusions  - Left ventricle: The cavity size was normal. There was moderate   concentric hypertrophy. Systolic function was normal. The   estimated ejection fraction was in the range of 55% to 60%. Wall   motion was normal; there were no regional wall motion   abnormalities. Left ventricular diastolic function parameters   were normal. - Aortic valve: Trileaflet; normal thickness leaflets. There was no   regurgitation. - Aortic root: The aortic root was normal in size. - Mitral valve: Structurally normal valve. There was no   regurgitation. - Right ventricle: The cavity size was normal. Wall thickness was   normal. Systolic function was normal. - Right atrium: The atrium was normal in size. - Tricuspid valve: There was no regurgitation. - Pulmonic valve: There was no regurgitation. - Pulmonary arteries: Systolic pressure couldn&'t be assessed as   there was no tricuspid regurgitation. - Inferior vena cava: The vessel was normal in size. - Pericardium, extracardiac: There was no pericardial effusion.  Impressions:  - Normal study.  Transthoracic echocardiography.  M-mode, complete 2D, spectral Doppler, and color Doppler.  Birthdate:  Patient birthdate: [birth year].  Age:  Patient is 53 yr old.  Sex:  Gender: male. BMI: 25.4 kg/m^2.  Blood pressure:     145/96  Patient status: Inpatient.  Study date:  Study date: 10/30/2014. Study time: 09:16 AM.  Location:  Bedside.  -------------------------------------------------------------------  ------------------------------------------------------------------- Left ventricle:  The cavity size was normal. There was moderate concentric hypertrophy. Systolic function was normal. The estimated ejection fraction was in the range of 55% to 60%. Wall motion was normal; there were no regional wall motion abnormalities.  The transmitral flow pattern was normal. The deceleration time of the early transmitral flow velocity was normal. The pulmonary vein flow pattern was normal. The tissue Doppler parameters were normal. Left ventricular diastolic function parameters were normal.  ------------------------------------------------------------------- Aortic valve:   Trileaflet; normal thickness leaflets. Mobility was not restricted.  Doppler:  Transvalvular velocity was within the normal range. There was no stenosis. There was no regurgitation.   ------------------------------------------------------------------- Aorta:  Aortic root: The aortic root was normal in size.  ------------------------------------------------------------------- Mitral valve:   Structurally normal valve.   Mobility was not restricted.  Doppler:  Transvalvular velocity was within the normal range. There was no evidence for stenosis. There was no regurgitation.  ------------------------------------------------------------------- Left atrium:  The atrium was normal in size.  ------------------------------------------------------------------- Right ventricle:  The cavity size was normal. Wall thickness was normal. Systolic function was normal.  ------------------------------------------------------------------- Pulmonic valve:    Structurally normal valve.   Cusp separation was normal.  Doppler:  Transvalvular velocity was within the normal range. There was no evidence for stenosis. There was no regurgitation.  ------------------------------------------------------------------- Tricuspid valve:   Structurally normal valve.    Doppler: Transvalvular velocity was within the normal range. There was no regurgitation.  ------------------------------------------------------------------- Pulmonary artery:   The main pulmonary artery was normal-sized. Systolic pressure couldn&'t be assessed as  there was no  tricuspid regurgitation.  ------------------------------------------------------------------- Right atrium:  The atrium was normal in size.  ------------------------------------------------------------------- Pericardium:  There was no pericardial effusion.  ------------------------------------------------------------------- Systemic veins: Inferior vena cava: The vessel was normal in size.  ------------------------------------------------------------------- Measurements   Left ventricle                           Value        Reference  LV ID, ED, PLAX chordal                  46.5  mm     43 - 52  LV ID, ES, PLAX chordal                  31.3  mm     23 - 38  LV fx shortening, PLAX chordal           33    %      >=29  LV PW thickness, ED                      14.5  mm     ---------  IVS/LV PW ratio, ED                      0.87         <=1.3  LV ejection fraction, 1-p A4C            44    %      ---------  LV end-diastolic volume, 2-p             96    ml     ---------  LV end-systolic volume, 2-p              44    ml     ---------  LV ejection fraction, 2-p                54    %      ---------  Stroke volume, 2-p                       52    ml     ---------  LV end-diastolic volume/bsa, 2-p         47    ml/m^2 ---------  LV end-systolic volume/bsa, 2-p          22    ml/m^2 ---------  Stroke volume/bsa, 2-p                   25.5  ml/m^2 ---------  LV e&', lateral                           8.99  cm/s   ---------  LV E/e&', lateral                         6.59         ---------  LV e&', medial                            8.33  cm/s   ---------  LV E/e&', medial                          7.11         ---------  LV e&', average                           8.66  cm/s   ---------  LV E/e&', average                         6.84         ---------    Ventricular septum                       Value        Reference  IVS thickness, ED                        12.6  mm     ---------     Aorta                                    Value        Reference  Aortic root ID, ED                       38    mm     ---------    Left atrium                              Value        Reference  LA ID, A-P, ES                           39    mm     ---------  LA ID/bsa, A-P                           1.91  cm/m^2 <=2.2  LA volume, S                             38    ml     ---------  LA volume/bsa, S                         18.6  ml/m^2 ---------  LA volume, ES, 1-p A4C                   37    ml     ---------  LA volume/bsa, ES, 1-p A4C               18.1  ml/m^2 ---------  LA volume, ES, 1-p A2C                   38    ml     ---------  LA volume/bsa, ES, 1-p A2C               18.6  ml/m^2 ---------    Mitral valve                             Value        Reference  Mitral E-wave peak velocity              59.2  cm/s   ---------  Mitral A-wave peak velocity              37    cm/s   ---------  Mitral deceleration time                 225   ms     150 - 230  Mitral E/A ratio, peak                   1.6          ---------    Systemic veins                           Value        Reference  Estimated CVP                            3     mm Hg  ---------  Legend: (L)  and  (H)  mark values outside specified reference range.  ------------------------------------------------------------------- Prepared and Electronically Authenticated by  Ena Dawley, M.D. 2016-08-03T14:13:37   ECHOCARDIOGRAM REPORT       Patient Name:   Patel Patel Date of Exam: 06/07/2018 Medical Rec #:  [id number]        Height:       71.0 in Accession #:    [id number]       Weight:       210.8 lb Date of Birth:  [birth year]        BSA:          2.16 m Patient Age:    71 years         BP:           112/56 mmHg Patient Gender: M                HR:           79 bpm. Exam Location:  Inpatient    Procedure: 2D Echo  Indications:    786.50 chest pain   History:        Patient has prior history  of Echocardiogram examinations, most                 recent 10/30/2014. CAD; Risk Factors: Hypertension and                 Dyslipidemia. NSTEMI. OSA.   Sonographer:    Jannett Celestine RDCS (AE) Referring Phys: [id number] [place]    1. The left ventricle has normal systolic function, with an ejection fraction of 55-60%. The cavity size was moderately dilated. Left ventricular diastolic Doppler parameters are indeterminate.  2. The right ventricle has normal systolic function. The cavity was normal. There is no increase in right ventricular wall thickness. Right ventricular systolic pressure is mildly elevated with an estimated pressure of 42.2 mmHg.  3. Mild thickening of the mitral valve leaflet. Mild calcification of the mitral valve leaflet.  4. The aortic valve is abnormal Mild thickening of the aortic valve Mild calcification of the aortic valve. Aortic valve regurgitation is moderate to severe by color flow Doppler.  5. Moderate (based on pressure half time of 251 ms) to severe (based on vena contracta of 0.93 cm) aortic regurgitation. Cannot definitively determine if valve is bicuspid or tricuspid.  6. The inferior vena cava was dilated in size with <50% respiratory variability.  SUMMARY  Abnormal flow in LVOT and aortic root. On short axis images, also appears to cross into right atrium vs. out of frame turbulent flow into RCA. Similar pattern on apical views (see image 44, 57). May be Sinus of Valsalva rupture vs. eccentric moderate-severe aortic regurgitation with dilated LV (LVEDD 6.19 cm). Cannot determine if valve is bicuspid or tricuspid on these images. Recommend TEE or cardiac MRI to better assess aortic valve structur/regurgitation vs. Sinus of Valsalva rupture.  FINDINGS  Left Ventricle: The left ventricle has normal systolic function, with an ejection fraction of 55-60%. The cavity size was moderately dilated. There is no increase in left ventricular  wall thickness. Left ventricular diastolic Doppler parameters are  indeterminate. Right Ventricle: The right ventricle has normal systolic function. The cavity was normal. There is no increase in right ventricular wall thickness. Right ventricular systolic pressure is mildly elevated with an estimated pressure of 42.2 mmHg. Left Atrium: left atrial size was normal in size Right Atrium: right atrial size was normal in size. Right atrial pressure is estimated at 15 mmHg. Interatrial Septum: No atrial level shunt detected by color flow Doppler. Pericardium: There is no evidence of pericardial effusion. Mitral Valve: The mitral valve is normal in structure. Mild thickening of the mitral valve leaflet. Mild calcification of the mitral valve leaflet. Mitral valve regurgitation is trivial by color flow Doppler. Tricuspid Valve: The tricuspid valve is normal in structure. Tricuspid valve regurgitation was not visualized by color flow Doppler. Aortic Valve: The aortic valve is abnormal Mild thickening of the aortic valve Mild calcification of the aortic valve. Aortic valve regurgitation is moderate to severe by color flow Doppler. There is no evidence of aortic valve stenosis. Moderate (based  on pressure half time of 251 ms) to severe (based on vena contracta of 0.93 cm) aortic regurgitation. Cannot definitively determine if valve is bicuspid or tricuspid. Pulmonic Valve: The pulmonic valve was grossly normal. Pulmonic valve regurgitation is trivial by color flow Doppler. Venous: The inferior vena cava is dilated in size with less than 50% respiratory variability.   LEFT VENTRICLE PLAX 2D LVIDd:         6.19 cm  Diastology LVIDs:         4.02 cm  LV e' lateral:   11.50 cm/s LV PW:         1.07 cm  LV E/e' lateral: 6.8 LV IVS:        1.05 cm  LV e' medial:    7.60 cm/s LVOT diam:     2.40 cm  LV E/e' medial:  10.3 LV SV:         122 ml LV SV Index:   55.41 LVOT Area:     4.52 cm  RIGHT  VENTRICLE RV Basal diam:  4.17 cm RV S prime:     17.30 cm/s TAPSE (M-mode): 2.5 cm RVSP:           42.2 mmHg  LEFT ATRIUM             Index       RIGHT ATRIUM           Index LA diam:        4.00 cm 1.86 cm/m  RA Pressure: 15 mmHg LA Vol (A2C):   64.8 ml 30.06 ml/m RA Area:     17.20 cm LA Vol (A4C):   55.2 ml 25.60 ml/m RA Volume:   36.60 ml  16.98 ml/m LA Biplane Vol: 63.8 ml 29.59 ml/m  AORTIC VALVE LVOT Vmax:   89.40 cm/s LVOT Vmean:  65.100 cm/s LVOT VTI:    0.215 m   AORTA Ao Root diam: 3.30 cm  MITRAL VALVE              TRICUSPID VALVE MV Area (PHT): 3.77 cm   TR Peak grad:   27.2 mmHg MV PHT:        58.29 msec TR Vmax:        261.00 cm/s MV Decel Time: 201 msec   RVSP:           42.2 mmHg MV E velocity: 78.10 cm/s MV A velocity: 43.70 cm/s SHUNTS MV E/A ratio:  1.79       Systemic VTI:  0.22 m                           Systemic Diam: 2.40 cm    Buford Dresser MD Electronically signed by Buford Dresser MD Signature Date/Time: 06/07/2018/2:56:26 PM     TRANSESOPHOGEAL ECHO REPORT       Patient Name:   Patel Patel Date of Exam: 06/08/2018 Medical Rec #:  [id number]        Height:       71.0 in Accession #:    [id number]       Weight:       210.8 lb Date of Birth:  [birth year]        BSA:          2.16 m Patient Age:    39 years         BP:           113/78 mmHg Patient Gender: M                HR:           73 bpm. Exam Location:  Inpatient    Procedure: 2D Echo  Indications:     aortic regurgitation evaluation. chest pain 786.50   History:         Patient has prior history of Echocardiogram examinations, most                  recent 06/07/2018. Risk Factors: Hypertension and Dyslipidemia.   Sonographer:     Jannett Celestine RDCS (AE) Referring Phys:  [id number] PETER M [place] Diagnosing Phys: Buford Dresser MD     PROCEDURE: No source of intracardiac mass. No evidence of intracardiac thrombus. No evidence of vegetation.  Consent was requested emergently by emergency room physicain. After discussion of the risks and benefits of a TEE, an informed consent was  obtained from the patient. Local oropharyngeal anesthetic was provided with cetacaine. Patients was under conscious sedation during this procedure. Anesthetic was administered intravenously by performing Physician: 70mcg of Fentanyl, 4.0mg  of Versed. The  transesophogeal probe was passed through the esophogus of the patient. Imaged were obtained with the patient in a left lateral decubitus position. Image quality was good. The patient's vital signs; including heart rate, blood pressure, and oxygen  saturation; remained stable throughout the procedure. The patient developed no complications during the procedure.  IMPRESSIONS    1. The left ventricle has low normal systolic function, with an ejection fraction of 50-55%. The cavity size was moderately dilated. No evidence of left ventricular regional wall motion abnormalities.  2. The right ventricle has normal systolc function. The cavity was normal. There is no increase in right ventricular wall thickness.  3. No  evidence of mitral valve stenosis. No mitral valve vegetation visualized.  4. The aortic valve is tricuspid Aortic valve regurgitation is severe by color flow Doppler.  5. There is evidence of mild plaque in the descending aorta.  SUMMARY   Eccentric AR jet consistent with severe aortic regurgitation. 3D images and color flow attempted to further evaluate. No windsock or clear evidence of Sinus of Valsalva rupture, and no flow seen on RA/RV views to suggest SoV rupture or VSD as cause. Jet difficult to measure given acute angle from cusp closure. Vena contracta estimated at at least 0.8 cm. Appears that there may be a prolapsed cusp as the etiology of the AI.   Patient was very sensitive to movement of the probe. He did not tolerate transgastric views. Additional views attempted for  further assessment of the aortic valve, but he began coughing/gagging, with sinus tachycardia in the 150s. Blood pressure at the time was 101/39, and given wide pulse pressure, decision was made to end the case instead of administering additional sedation.   TTE and TEE evidence together suggests severe eccentric AR, possibly due to prolapsed coronary cusp, with moderately dilated LV and borderline EF. Meets criteria for aortic valve replacement.  FINDINGS  Left Ventricle: The left ventricle has low normal systolic function, with an ejection fraction of 50-55%. The cavity size was moderately dilated. There is no increase in left ventricular wall thickness. No evidence of left ventricular regional wall  motion abnormalities. Right Ventricle: The right ventricle has normal systolic function. The cavity was normal. There is no increase in right ventricular wall thickness. Left Atrium: Left atrial size was normal in size. Right Atrium: Right atrial size was normal in size. Right atrial pressure is estimated at 10 mmHg. Interatrial Septum: No atrial level shunt detected by color flow Doppler. Pericardium: There is no evidence of pericardial effusion. Mitral Valve: The mitral valve is normal in structure. Mitral valve regurgitation is trivial by color flow Doppler. No evidence of mitral valve stenosis. There is no evidence of mitral valve vegetation. Tricuspid Valve: The tricuspid valve was normal in structure. Tricuspid valve regurgitation was not visualized by color flow Doppler. No TV vegetation was visualized. Aortic Valve: The aortic valve is tricuspid Aortic valve regurgitation is severe by color flow Doppler. There is no evidence of aortic valve stenosis. There is no evidence of a vegetation on the aortic valve. Pulmonic Valve: The pulmonic valve was normal in structure. Pulmonic valve regurgitation is trivial by color flow Doppler. No evidence of pulmonic stenosis. Aorta: There is evidence of  mild plaque in the descending aorta. Venous: The inferior vena cava is normal in size with greater than 50% respiratory variability.   LV Wall Scoring:  RIGHT ATRIUM RA Pressure: 10 mmHg    Buford Dresser MD Electronically signed by Buford Dresser MD Signature Date/Time: 06/09/2018/7:57:21 AM     RIGHT/LEFT HEART CATH AND CORONARY ANGIOGRAPHY  Conclusion     Previously placed Ost LAD to Prox LAD stent (unknown type) is widely patent.  Prox Cx to Mid Cx lesion is 10% stenosed.   1. Patent proximal LAD stent 2. Normal filling pressures    Recommendations   Antiplatelet/Anticoag Medical management of CAD. Proceed with workup for aortic valve replacement.  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   Procedure: The risks, benefits, complications, treatment options, and  expected outcomes were discussed with the patient. The patient and/or family concurred with the proposed plan, giving informed consent. The patient was brought to the cath lab after IV hydration was given. The patient was sedated with Versed and Fentanyl. I changed out the right antecubital vein IV catheter for a 5 French sheath. Right heart catheterization performed with a balloon tipped catheter. The right groin was prepped and draped in the usual manner. Using the modified Seldinger access technique, a 5 French sheath was placed in the right femoral artery using u/s guidance. Standard diagnostic catheters were used to perform selective coronary angiography. LV pressures measured with a JR4 catheter.   There were no immediate complications. The patient was taken to the recovery area in stable condition.   Estimated blood loss <50 mL.   During this procedure medications were administered to achieve and maintain moderate  conscious sedation while the patient's heart rate, blood pressure, and oxygen saturation were continuously monitored and I was present face-to-face 100% of this time.  Medications  (Filter: Administrations occurring from 08/11/18 0726 to 08/11/18 0867)  Medication Rate/Dose/Volume Action  Date Time   midazolam (VERSED) injection (mg) 1 mg Given 08/11/18 0740   Total dose as of 09/01/18 1240 1 mg Given 0804   2 mg        fentaNYL (SUBLIMAZE) injection (mcg) 25 mcg Given 08/11/18 0740   Total dose as of 09/01/18 1240 25 mcg Given 0804   50 mcg        lidocaine (PF) (XYLOCAINE) 1 % injection (mL) 2 mL Given 08/11/18 0753   Total dose as of 09/01/18 1240 15 mL Given 0801   17 mL        Heparin (Porcine) in NaCl 1000-0.9 UT/500ML-% SOLN (mL) 500 mL Given 08/11/18 0809   Total dose as of 09/01/18 1240 500 mL Given 0809   1,000 mL        iohexol (OMNIPAQUE) 350 MG/ML injection (mL) 55 mL Given 08/11/18 0820   Total dose as of 09/01/18 1240        55 mL        Sedation Time   Sedation Time Physician-1: 35 minutes 34 seconds  Complications   Complications documented before study signed (08/11/2018 8:31 AM EDT)    RIGHT/LEFT HEART CATH AND CORONARY ANGIOGRAPHY   None Documented by Patel Blanks, MD 08/11/2018 8:30 AM EDT  Time Range: Intraprocedure      Coronary Findings   Diagnostic  Dominance: Right  Left Anterior Descending  Ost LAD to Prox LAD lesion 0% stenosed  Previously placed Ost LAD to Prox LAD stent (unknown type) is widely patent.  Left Circumflex  Prox Cx to Mid Cx lesion 10% stenosed  Prox Cx to Mid Cx lesion is 10% stenosed.  Intervention   No interventions have been documented.  Coronary Diagrams   Diagnostic  Dominance: Right    Intervention   Implants    No implant documentation for this case.  Syngo Images   Show images for CARDIAC CATHETERIZATION  Images on Long Term Storage   Show images for Nat, Lowenthal "Yvone Neu"   Link to  Procedure Log   Procedure Log    Hemo Data    Most Recent Value  Fick Cardiac Output 4.79 L/min  Fick Cardiac Output Index 2.25 (L/min)/BSA  Aortic Mean Gradient 4.6 mmHg  Aortic Peak Gradient 0 mmHg  Aortic Valve Area >3.50  Aortic Value Area Index 1.64 cm2/BSA  RA A Wave 5 mmHg  RA V Wave 3 mmHg  RA Mean 3 mmHg  RV Systolic Pressure 25 mmHg  RV Diastolic Pressure 1 mmHg  RV EDP 6 mmHg  PA Systolic Pressure 21 mmHg  PA Diastolic Pressure 5 mmHg  PA Mean 12 mmHg  PW A Wave 16 mmHg  PW V Wave 12 mmHg  PW Mean 9 mmHg  AO Systolic Pressure 283 mmHg  AO Diastolic Pressure 55 mmHg  AO Mean 78 mmHg  LV Systolic Pressure 151 mmHg  LV Diastolic Pressure 4 mmHg  LV EDP 15 mmHg  AOp Systolic Pressure 761 mmHg  AOp Diastolic Pressure 53 mmHg  AOp Mean Pressure 81 mmHg  LVp Systolic Pressure 607 mmHg  LVp Diastolic Pressure 4 mmHg  LVp EDP Pressure 13 mmHg  QP/QS 1  TPVR Index 5.34 HRUI  TSVR Index 34.7 HRUI  PVR SVR Ratio 0.04  TPVR/TSVR Ratio 0.15      Cardiac CTA  MEDICATIONS: Sub lingual nitro. 4mg  and lopressor 50mg  PO  TECHNIQUE: The patient was scanned on a Siemens Force 371 slice scanner. Gantry rotation speed was 250 msecs. Collimation was .6 mm. A 100 kV prospective scan was triggered in the ascending thoracic aorta at 140 HU's Full mA was used between 35% and 75% of the R-R interval. Average HR during the scan was 58 bpm. The 3D data set was interpreted on a dedicated work station using MPR, MIP and VRT modes. A total of 80 cc of contrast was used.  FINDINGS: Non-cardiac: See separate report from [facility]. No significant findings on limited lung and soft tissue windows.  Calcium Score: Not performed due to coronary stent  Coronary Arteries: Right dominant with no anomalies: No significant obstructive disease in RCA/Circumflex Patent stent in the proximal LAD  Aortic Valve: The aortic valve is tri leaflet. There appears to  be prolapse of the right coronary cusp.  Aorta: Normal size. Normal arch vessels No coarctation and normal descending thoracic aorta  Sinus: Largest diameter left sinus 4.0 cm  STJ: 2.9 cm  Ascending aortic root 3.3 cm  Descending Thoracic Aorta: 2.6 cm  IMPRESSION: 1. Normal aortic root and arch vessels with no coarctation Ascending aortic root 3.3 cm  2. Patent proximal LAD stent with no significant obstructive disease in RCA/Circumflex  3. Tri- leaflet AV with likely prolapse of the right coronary cusp as etiology of severe AR  Jenkins Rouge  Electronically Signed: By: Jenkins Rouge M.D. On: 08/28/2018 16:15    CT ANGIOGRAPHY CHEST, ABDOMEN AND PELVIS  TECHNIQUE: Multidetector CT imaging through the chest, abdomen and pelvis was performed using the standard protocol during bolus administration of intravenous contrast. Multiplanar reconstructed images and MIPs were obtained and reviewed to evaluate the vascular anatomy.  CONTRAST:  142mL OMNIPAQUE IOHEXOL 350 MG/ML SOLN  COMPARISON:  CT the abdomen and pelvis 06/08/2018. No prior chest CT.  FINDINGS: CTA CHEST FINDINGS  Cardiovascular: Heart size is normal. There is no significant pericardial fluid, thickening or pericardial calcification. Left anterior descending coronary artery stent. No atherosclerotic calcifications in the thoracic aorta.  Mediastinum/Lymph Nodes: No pathologically enlarged mediastinal or hilar lymph nodes. Esophagus is unremarkable in appearance. No axillary lymphadenopathy.  Lungs/Pleura: No suspicious appearing pulmonary nodules or masses. No acute consolidative airspace disease. No pleural effusions.  Musculoskeletal/Soft Tissues: There are no aggressive appearing lytic or blastic lesions noted in the visualized portions of the skeleton.  CTA ABDOMEN AND PELVIS FINDINGS  Hepatobiliary: 2 small hypervascular areas are noted in the liver, measuring 7 mm  in  segment 5 and 5 mm in segment 8 (axial images 101 of series 17 and 87 of series 17 respectively). These are incompletely characterized on today's examination, but favored to represent perfusion anomalies. No other definite suspicious cystic or solid hepatic lesions. No intra or extrahepatic biliary ductal dilatation. Gallbladder is normal in appearance.  Pancreas: No pancreatic mass. No pancreatic ductal dilatation. No pancreatic or peripancreatic fluid or inflammatory changes.  Spleen: Unremarkable.  Adrenals/Urinary Tract: Bilateral kidneys and adrenal glands are normal in appearance. No hydroureteronephrosis. Urinary bladder is normal in appearance.  Stomach/Bowel: Normal appearance of the stomach. No pathologic dilatation of small bowel or colon. A few scattered colonic diverticulae are noted, without surrounding inflammatory changes to suggest an acute diverticulitis at this time. Normal appendix.  Vascular/Lymphatic: Aortic atherosclerosis, without evidence of aneurysm or dissection in the abdominal or pelvic vasculature. No lymphadenopathy noted in the abdomen or pelvis.  Reproductive: Prostate gland and seminal vesicles are unremarkable in appearance.  Other: No significant volume of ascites.  No pneumoperitoneum.  Musculoskeletal: There are no aggressive appearing lytic or blastic lesions noted in the visualized portions of the skeleton.  VASCULAR MEASUREMENTS PERTINENT TO TAVR:  AORTA:  Minimal Aortic Diameter-16 x 16 mm  Severity of Aortic Calcification-mild  RIGHT PELVIS:  Right Common Iliac Artery -  Minimal Diameter-10.1 x 9.9 mm  Tortuosity-moderate  Calcification-moderate  Right External Iliac Artery -  Minimal Diameter-7.8 x 7.0 mm  Tortuosity-mild  Calcification-mild  Right Common Femoral Artery -  Minimal Diameter-9.4 x 8.2 mm  Tortuosity - mild  Calcification-mild  LEFT PELVIS:  Left Common Iliac  Artery -  Minimal Diameter-10.7 x 8.8 mm  Tortuosity - mild  Calcification-moderate  Left External Iliac Artery -  Minimal Diameter-8.6 x 8.1 mm  Tortuosity - mild  Calcification-mild  Left Common Femoral Artery -  Minimal Diameter-9.5 x 9.5 mm  Tortuosity - mild  Calcification-mild  Review of the MIP images confirms the above findings.  IMPRESSION: 1. Vascular findings and measurements pertinent to potential TAVR procedure, as detailed above. 2. Small hypervascular lesions in the liver, as discussed above. These are incompletely characterized on today's arterial phase examination, however, these are strongly favored to represent benign perfusion anomalies. If of clinical concern, these could be definitively characterized with MRI of the abdomen with and without IV gadolinium. 3. Mild colonic diverticulosis without evidence to suggest an acute diverticulitis at this time.   Electronically Signed   By: Vinnie Langton M.D.   On: 08/29/2018 09:10    EKG: NSR w/out significant AV conduction delay    Impression:  Patient has stage D severe symptomatic aortic insufficiency.  He presents with fairly rapid progression of symptoms over the last few months consistent with acute diastolic congestive heart failure, currently [place] Heart Association functional class III.  I have personally reviewed the patient's recent echocardiograms, diagnostic cardiac catheterization, and CT angiograms.  His aortic valve is trileaflet with isolated prolapse involving the right coronary cusp.  There are no obvious perforations or other signs to suggest history of endocarditis.  There is no significant leaflet fibrosis or thickening to suggest underlying rheumatic disease.  There is moderate left ventricular chamber enlargement but otherwise normal left ventricular systolic function.  Diagnostic cardiac catheterization is notable for the absence of significant coronary  artery disease and right heart pressures were normal.  Options include the possibility of elective aortic valve repair versus replacement.  I feel the patient would be relatively good candidate for an attempt  at valve repair.   Plan:  The patient and his wife were counseled at length regarding treatment alternatives for management of severe aortic insufficiency including continued medical therapy versus proceeding with aortic valve repair or replacement in the near future.  The natural history of aortic insufficiency was reviewed, as was long term prognosis with medical therapy alone.  Surgical options were discussed at length including an extensive discussion comparing the risks and benefits of valve repair and valve replacement.  Questions about long-term durability of successful valve repair were discussed including review of our local experience and experience in the published literature.  I explained that for an attempt at valve repair the patient would require conventional median sternotomy.  Alternatively, aortic valve replacement could be performed safely through either a full median sternotomy or using minimally invasive techniques.  He understands that transcatheter aortic valve replacement is not currently an option for patients with pure aortic insufficiency.  If the patient's valve is not repairable, discussion was held comparing the relative risks of mechanical valve replacement with need for lifelong anticoagulation versus use of a bioprosthetic tissue valve and the associated potential for late structural valve deterioration and failure.  This discussion was placed in the context of the patient's particular circumstances, and as a result the patient specifically requests that if his valve cannot be repaired he request that it be replaced using a bioprosthetic tissue valve.   The patient understands and accepts all potential associated risks of surgery including but not limited to risk of death,  stroke, myocardial infarction, congestive heart failure, respiratory failure, renal failure, pneumonia, bleeding requiring blood transfusion and or reexploration, arrhythmia, heart block or bradycardia requiring permanent pacemaker, aortic dissection or other major vascular complication, pleural effusions or other delayed complications related to continued congestive heart failure, and other late complications related to valve repair or replacement including structural valve deterioration and failure, thrombosis, endocarditis, or paravalvular leak.  We plan to proceed with surgery on September 15, 2018.     I spent in excess of 90 minutes during the conduct of this office consultation and >50% of this time involved direct face-to-face encounter with the patient for counseling and/or coordination of their care.   Valentina Gu. Roxy Manns, MD 09/01/2018 10:52 AM

## 2018-09-07 ENCOUNTER — Telehealth: Payer: Self-pay

## 2018-09-07 ENCOUNTER — Other Ambulatory Visit: Payer: Self-pay | Admitting: *Deleted

## 2018-09-07 NOTE — Telephone Encounter (Signed)
Called patient to check on him per Richardson Dopp. Patient complaining of not feeling right, bloated, feels like a lead softball in chest, fatigue and weakness. Per Richardson Dopp PA is patient needs to be seen to add him on to Dr. Theodosia Blender schedule, since she is DOD tomorrow. Patient tried to go to work today and only lasted for 2 hours. Patient wife stated patient should come in and be seen. Made patient an appointment with Dr. Radford Pax tomorrow.      COVID-19 Pre-Screening Questions:  . In the past 7 to 10 days have you had a cough,  shortness of breath, headache, congestion, fever (100 or greater) body aches, chills, sore throat, or sudden loss of taste or sense of smell? no . Have you been around anyone with known Covid 19. no . Have you been around anyone who is awaiting Covid 19 test results in the past 7 to 10 days? no . Have you been around anyone who has been exposed to Covid 19, or has mentioned symptoms of Covid 19 within the past 7 to 10 days? no  If you have any concerns/questions about symptoms patients report during screening (either on the phone or at threshold). Contact the provider seeing the patient or DOD for further guidance.  If neither are available contact a member of the leadership team.

## 2018-09-08 ENCOUNTER — Ambulatory Visit
Admission: RE | Admit: 2018-09-08 | Discharge: 2018-09-08 | Disposition: A | Discharge status: Home / Self Care | Payer: 59 | Source: Ambulatory Visit | Attending: Cardiology | Admitting: Cardiology

## 2018-09-08 ENCOUNTER — Other Ambulatory Visit: Payer: Self-pay

## 2018-09-08 ENCOUNTER — Ambulatory Visit: Payer: 59 | Admitting: Cardiology

## 2018-09-08 ENCOUNTER — Encounter: Payer: Self-pay | Admitting: Cardiology

## 2018-09-08 VITALS — BP 126/72 | HR 64 | Ht 71.0 in | Wt 211.6 lb

## 2018-09-08 DIAGNOSIS — E7800 Pure hypercholesterolemia, unspecified: Secondary | ICD-10-CM | POA: Diagnosis not present

## 2018-09-08 DIAGNOSIS — I10 Essential (primary) hypertension: Secondary | ICD-10-CM | POA: Diagnosis not present

## 2018-09-08 DIAGNOSIS — R0602 Shortness of breath: Secondary | ICD-10-CM

## 2018-09-08 DIAGNOSIS — I351 Nonrheumatic aortic (valve) insufficiency: Secondary | ICD-10-CM | POA: Diagnosis not present

## 2018-09-08 DIAGNOSIS — T733XXA Exhaustion due to excessive exertion, initial encounter: Secondary | ICD-10-CM | POA: Diagnosis not present

## 2018-09-08 DIAGNOSIS — R0789 Other chest pain: Secondary | ICD-10-CM | POA: Diagnosis not present

## 2018-09-08 DIAGNOSIS — G4733 Obstructive sleep apnea (adult) (pediatric): Secondary | ICD-10-CM | POA: Diagnosis not present

## 2018-09-08 DIAGNOSIS — I2510 Atherosclerotic heart disease of native coronary artery without angina pectoris: Secondary | ICD-10-CM

## 2018-09-08 DIAGNOSIS — R079 Chest pain, unspecified: Secondary | ICD-10-CM

## 2018-09-08 LAB — TROPONIN T: Troponin T TROPT: <0.011 ng/mL (ref <0.011)

## 2018-09-08 NOTE — Progress Notes (Signed)
Cardiology Office Note:    Date:  09/12/2018   ID:  Isaac Patel, DOB 09/20/1965, MRN [id number]  PCP:  Biagio Borg, MD  Cardiologist:  [name], MD    Referring MD: [name], NP   No chief complaint on file.   History of Present Illness:    Chistopher Patel is a 53 y.o. male  with CAD s/p NSTEMI in 8/16 tx with DES to the proximal LAD. Cardiac Catheterizationin 2/2018demonstrated a patent LAD stent. He was admitted 3/10-3/02/2019 with chest and abdominal pain.  He ruled out for ACS.  He was noted to have murmur on exam and an echocardiogram demonstrated moderate to severe aortic insufficiency.  Transesophageal echocardiogram confirmed severe aortic insufficiency.  His LV is dilated and he was felt to meet criteria for aortic valve replacement.  The plan was to proceed with further work-up as an outpatient.   He was seen by Richardson Dopp, PA 06/16/2018 and was complaining of exertional fatigue with minimal SOB but no exertional CP.  He had had a URI a few weeks prior to that.  He was seen back again 08/04/2018 in televisit with complaints of SOB and chest pain along with abdominal fullness, orthopnea but no PND.  He also was complaining of LE edema.  He underwent Cath 08/11/2018 showing patent stent in the oLAD-pLAD and 10% pLCx with normal LV filling pressures.    He called in yesterday stating that he is feeling bloated like he has a lead softball in his chest and abdomen along with fatigue and weakness.  He is set up for surgical repair/replacement of his AV next Friday.  He is here today to evaluate due to feeling bloated.  He has not chest pain but says that he is feeling much more fatigued over the past 1-2 weeks to the point he has no energy to do anything.  He denies any SOB or LE edema.  He describes a heaviness in his chest pain and abdomen.  His wife was on the phone during the [place] and stated that he has definitely declined recently in how much energy he has.  He  admits to being anxious about his diagnosis and is afraid to go to sleep at night that he may not wake up.  He uses CPAP at night with no problems and no PND or orthopnea.   Past Medical History:  Diagnosis Date   Aortic insufficiency    CAD (coronary artery disease)    a.  Non-STEMI 8/16: LHC-proximal LAD 80% treated with a resolute DES, EF 50-55%;  b.  Echo 8/16:  Moderate LVH, EF 55-60%, normal wall motion, normal diastolic function, normal RV function   Depression    hx   GERD (gastroesophageal reflux disease)    Headache    "maybe monthly" (10/28/2014)   HLD (hyperlipidemia)    Hypertension    NSTEMI (non-ST elevated myocardial infarction) ([place]) 10/28/2014   OSA (obstructive sleep apnea)    "tried mask; wear it off and on" (10/28/2014)    Past Surgical History:  Procedure Laterality Date   ARM AMPUTATION THROUGH FOREARM Left 2009   traumatic injury   CARDIAC CATHETERIZATION N/A 10/29/2014   Procedure: Left Heart Cath and Coronary Angiography;  Surgeon: Troy Sine, MD;  Location: [facility];  Service: Cardiovascular;  Laterality: N/A;   CHOLESTEATOMA EXCISION Right 1990's   LEFT HEART CATH AND CORONARY ANGIOGRAPHY N/A 05/25/2016   Procedure: Left Heart Cath and Coronary Angiography;  Surgeon: Legrand Como  Burt Knack, MD;  Location: [facility];  Service: Cardiovascular;  Laterality: N/A;   NASAL SEPTOPLASTY W/ TURBINOPLASTY Bilateral 11/30/2017   Procedure: NASAL SEPTOPLASTY WITH TURBINATE REDUCTION;  Surgeon: Jerrell Belfast, MD;  Location: [facility];  Service: ENT;  Laterality: Bilateral;   RIGHT/LEFT HEART CATH AND CORONARY ANGIOGRAPHY N/A 08/11/2018   Procedure: RIGHT/LEFT HEART CATH AND CORONARY ANGIOGRAPHY;  Surgeon: Burnell Blanks, MD;  Location: [facility];  Service: Cardiovascular;  Laterality: N/A;   TEE WITHOUT CARDIOVERSION N/A 06/08/2018   Procedure: TRANSESOPHAGEAL ECHOCARDIOGRAM (TEE);  Surgeon: Buford Dresser, MD;  Location: [facility]  ENDOSCOPY;  Service: Cardiovascular;  Laterality: N/A;   TYMPANOSTOMY TUBE PLACEMENT Bilateral "as a kid"   "had one done as an adult too"   WISDOM TOOTH EXTRACTION      Current Medications: Current Meds  Medication Sig   acetaminophen (TYLENOL) 500 MG tablet Take 1,000 mg by mouth daily as needed for moderate pain or headache.   aspirin EC 81 MG tablet Take 81 mg by mouth daily.   atorvastatin (LIPITOR) 80 MG tablet Take 1 tablet (80 mg total) by mouth daily at 6 PM.   calcium carbonate (TUMS - DOSED IN MG ELEMENTAL CALCIUM) 500 MG chewable tablet Chew 2 tablets by mouth daily as needed for indigestion or heartburn.   fluticasone (FLONASE) 50 MCG/ACT nasal spray Place 1 spray into both nostrils daily as needed for allergies or rhinitis.   gabapentin (NEURONTIN) 100 MG capsule Take 2 capsules (200 mg total) by mouth at bedtime.   lisinopril (PRINIVIL,ZESTRIL) 20 MG tablet Take 1 tablet (20 mg total) by mouth daily.   loratadine (CLARITIN) 10 MG tablet Take 10 mg by mouth daily.    Multiple Vitamin (MULTI-VITAMIN PO) Take 1 tablet by mouth daily.   nitroGLYCERIN (NITROSTAT) 0.4 MG SL tablet Place 1 tablet (0.4 mg total) under the tongue every 5 (five) minutes x 3 doses as needed for chest pain.   pantoprazole (PROTONIX) 40 MG tablet Take 1 tablet (40 mg total) by mouth 2 (two) times daily.     Allergies:   Morphine and related   Social History   Socioeconomic History   Marital status: Married    Spouse name: Not on file   Number of children: 2   Years of education: 14   Highest education level: Not on file  Occupational History   Occupation: CMA  Social Designer, fashion/clothing strain: Not on file   Food insecurity    Worry: Not on file    Inability: Not on file   Transportation needs    Medical: Not on file    Non-medical: Not on file  Tobacco Use   Smoking status: Current Some Day Smoker    Packs/day: 0.33    Years: 32.00    Pack years: 10.56     Types: Cigarettes   Smokeless tobacco: Never Used  Substance and Sexual Activity   Alcohol use: No    Alcohol/week: 2.0 - 3.0 standard drinks    Types: 2 - 3 Standard drinks or equivalent per week    Frequency: Never    Comment: stopped 06/19/2015,    Drug use: Yes    Types: Marijuana    Comment: 05/24/2016  "stopped marijuana in the early 2000's"   Sexual activity: Yes  Lifestyle   Physical activity    Days per week: Not on file    Minutes per session: Not on file   Stress: Not on file  Relationships  Social Herbalist on phone: Not on file    Gets together: Not on file    Attends religious service: Not on file    Active member of club or organization: Not on file    Attends meetings of clubs or organizations: Not on file    Relationship status: Not on file  Other Topics Concern   Not on file  Social History Narrative   Born and raised in [place], [place].  Currently resides in a house with his wife and children. 1 dog. Fun: play computer games, sports.    Denies any religious beliefs effecting health care.    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 There is no history of Colon cancer, Stomach cancer, Rectal cancer, Liver cancer, or Esophageal cancer.  ROS:   Please see the history of present illness.    ROS  All other systems reviewed and negative.   EKGs/Labs/Other Studies Reviewed:    The following studies were reviewed today: 2D echo  EKG:  EKG is not  ordered today.   Recent Labs: 09/21/2017: Magnesium 2.2 06/07/2018: TSH 1.414 09/08/2018: ALT 19; BUN 18; Creatinine, Ser 1.05; Hemoglobin 15.0; NT-Pro BNP 127; Platelets 219; Potassium 4.6; Sodium 142   Recent Lipid Panel    Component Value  Date/Time   CHOL 124 09/21/2017 1005   TRIG 151.0 (H) 09/21/2017 1005   HDL 35.90 (L) 09/21/2017 1005   CHOLHDL 3 09/21/2017 1005   VLDL 30.2 09/21/2017 1005   LDLCALC 58 09/21/2017 1005    Physical Exam:    VS:  BP 126/72    Pulse 64    Ht _0  (1.803 m)    Wt 211 lb 9.6 oz (96 kg)    SpO2 97%    BMI 29.51 kg/m     Wt Readings from Last 3 Encounters:  09/08/18 211 lb 9.6 oz (96 kg)  09/01/18 205 lb (93 kg)  08/17/18 205 lb (93 kg)     GEN:  Well nourished, well developed in no acute distress HEENT: Normal NECK: No JVD; No carotid bruits LYMPHATICS: No lymphadenopathy CARDIAC: RRR, no  rubs, gallops.  Faint 1/6 diastolic murmur at the LUSB and LLSB RESPIRATORY:  Clear to auscultation without rales, wheezing or rhonchi  ABDOMEN: Soft, non-tender, non-distended MUSCULOSKELETAL:  No edema; No deformity  SKIN: Warm and dry NEUROLOGIC:  Alert and oriented x 3 PSYCHIATRIC:  Normal affect   ASSESSMENT:    1. SOB (shortness of breath)   2. Chest pain of uncertain etiology   3. Fatigue due to excessive exertion, initial encounter   4. Severe aortic insufficiency   5. Essential hypertension   6. Coronary artery disease involving native coronary artery of native heart without angina pectoris   7. OSA (obstructive sleep apnea)   8. Pure hypercholesterolemia    PLAN:    In order of problems listed above:  1.  SOB - likely related to his underlying severe AI.  He has not evidence of volume overload on exam.  His lungs are clear and he has no LE edema.  He does not appear to have any abdominal distension although he says he feels like he has a weight in his chest and stomach.  -  check Cxray -O2 sats 97% on RA -check CBC, CMET, TSH and BNP although I do not think he is volume overloaded  2.  Chest discomfort - again described more as a weight in his chest and abdomen.   -recent cath 07/2018 showed widely patent oLAD stent and 10% pLCX stenosis.   -I do not think this is coronary  ischemia -I think he has some anxiety regarding upcoming AV surgery -I will check a Troponin stat  3.  Excessive exertional fatigue -again I think this is related to underlying severe AI -I will check a CBC to make sure he is not anemic  4.  Severe AI -plan for AVR next Friday per Dr. Roxy Manns  5.  Hypertension -BP is well controlled -continue on Lisinopril 35m daily  6.  ASCAD -recent cath 07/2018 showed widely patent oLAD stent and 10% pLCX stenosis. -continue ASA and statin  7.  OSA -he is compliant with using his CPAP at night  8  Hyperlipidemia -LDL goal < 70 -he will continue on atorvastatin 877mdaily.    Medication Adjustments/Labs and Tests Ordered: Current medicines are reviewed at length with the patient today.  Concerns regarding medicines are outlined above.  Orders Placed This Encounter  Procedures   DG Chest 2 View   CBC   Comp Met (CMET)   Pro b natriuretic peptide (BNP)   Troponin T   EKG 12-Lead   No orders of the defined types were placed in this encounter.   Signed, TrFransico HimMD  09/12/2018 8:13 PM    [place]

## 2018-09-08 NOTE — Patient Instructions (Signed)
Medication Instructions:  Your physician recommends that you continue on your current medications as directed. Please refer to the Current Medication list given to you today.  If you need a refill on your cardiac medications before your next appointment, please call your pharmacy.   Lab work: Today:Troponin, CBC, ProBNP and CMET  If you have labs (blood work) drawn today and your tests are completely normal, you will receive your results only by: Marland Kitchen MyChart Message (if you have MyChart) OR . A paper copy in the mail If you have any lab test that is abnormal or we need to change your treatment, we will call you to review the results.  Testing/Procedures: A chest x-ray takes a picture of the organs and structures inside the chest, including the heart, lungs, and blood vessels. This test can show several things, including, whether the heart is enlarges; whether fluid is building up in the lungs; and whether pacemaker / defibrillator leads are still in place.  [facility]: [facility] [id number]  Follow-Up: Will discuss after results.

## 2018-09-09 LAB — PRO B NATRIURETIC PEPTIDE: NT-Pro BNP: 127 pg/mL — ABNORMAL HIGH (ref 0–121)

## 2018-09-09 LAB — COMPREHENSIVE METABOLIC PANEL
ALT: 19 IU/L (ref 0–44)
AST: 18 IU/L (ref 0–40)
Albumin/Globulin Ratio: 2.3 — ABNORMAL HIGH (ref 1.2–2.2)
Albumin: 4.4 g/dL (ref 3.8–4.9)
Alkaline Phosphatase: 69 IU/L (ref 39–117)
BUN/Creatinine Ratio: 17 (ref 9–20)
BUN: 18 mg/dL (ref 6–24)
Bilirubin Total: 0.4 mg/dL (ref 0.0–1.2)
CO2: 22 mmol/L (ref 20–29)
Calcium: 9.4 mg/dL (ref 8.7–10.2)
Chloride: 105 mmol/L (ref 96–106)
Creatinine, Ser: 1.05 mg/dL (ref 0.76–1.27)
GFR calc Af Amer: 93 mL/min/{1.73_m2} (ref >59)
GFR calc non Af Amer: 81 mL/min/{1.73_m2} (ref >59)
Globulin, Total: 1.9 g/dL (ref 1.5–4.5)
Glucose: 108 mg/dL — ABNORMAL HIGH (ref 65–99)
Potassium: 4.6 mmol/L (ref 3.5–5.2)
Sodium: 142 mmol/L (ref 134–144)
Total Protein: 6.3 g/dL (ref 6.0–8.5)

## 2018-09-09 LAB — CBC
Hematocrit: 43.9 % (ref 37.5–51.0)
Hemoglobin: 15 g/dL (ref 13.0–17.7)
MCH: 32.3 pg (ref 26.6–33.0)
MCHC: 34.2 g/dL (ref 31.5–35.7)
MCV: 95 fL (ref 79–97)
Platelets: 219 10*3/uL (ref 150–450)
RBC: 4.64 x10E6/uL (ref 4.14–5.80)
RDW: 12.2 % (ref 11.6–15.4)
WBC: 6.8 10*3/uL (ref 3.4–10.8)

## 2018-09-12 DIAGNOSIS — T733XXA Exhaustion due to excessive exertion, initial encounter: Secondary | ICD-10-CM | POA: Insufficient documentation

## 2018-09-12 DIAGNOSIS — R0602 Shortness of breath: Secondary | ICD-10-CM | POA: Insufficient documentation

## 2018-09-12 NOTE — Pre-Procedure Instructions (Signed)
[place], [place] - [address] [facility] [place] [address] Phone: [phone] Fax: [phone]      Your procedure is scheduled on 09-15-18 Friday.  Report to [facility] Main Entrance "A" at 0530 A.M., and check in at the Admitting office.  Call this number if you have problems the morning of surgery:  [phone]  Call [phone] if you have any questions prior to your surgery date Monday-Friday 8am-4pm    Remember:  Do not eat or drink after midnight.  Take these medicines the morning of surgery with A SIP OF WATER : lisinopril (PRINIVIL,ZESTRIL) loratadine (CLARITIN) pantoprazole (PROTONIX) acetaminophen (TYLENOL)as needed fluticasone (FLONASE) as needed nitroGLYCERIN (NITROSTAT)  Follow your surgeon's instructions on when to stop Aspirin.  If no instructions were given by your surgeon then you will need to call the office to get those instructions.    7 days prior to surgery STOP taking any Aspirin (unless otherwise instructed by your surgeon), Aleve, Naproxen, Ibuprofen, Motrin, Advil, Goody's, BC's, all herbal medications, fish oil, and all vitamins.    The Morning of Surgery  Do not wear jewelry..  Do not wear lotions, powders, or colognes, or deodorant   Men may shave face and neck.  Do not bring valuables to the hospital.  [facility] is not responsible for any belongings or valuables.  If you are a smoker, DO NOT Smoke 24 hours prior to surgery IF you wear a CPAP at night please bring your mask, tubing, and machine the morning of surgery   Remember that you must have someone to transport you home after your surgery, and remain with you for 24 hours if you are discharged the same day.  Contacts, glasses, hearing aids, dentures or bridgework may not be worn into surgery.   For patients admitted to the hospital, discharge time will be determined by your treatment team.  Patients discharged the  day of surgery will not be allowed to drive home.    Special instructions:   [facility]- Preparing For Surgery  Before surgery, you can play an important role. Because skin is not sterile, your skin needs to be as free of germs as possible. You can reduce the number of germs on your skin by washing with CHG (chlorahexidine gluconate) Soap before surgery.  CHG is an antiseptic cleaner which kills germs and bonds with the skin to continue killing germs even after washing.    Oral Hygiene is also important to reduce your risk of infection.  Remember - BRUSH YOUR TEETH THE MORNING OF SURGERY WITH YOUR REGULAR TOOTHPASTE  Please do not use if you have an allergy to CHG or antibacterial soaps. If your skin becomes reddened/irritated stop using the CHG.  Do not shave (including legs and underarms) for at least 48 hours prior to first CHG shower. It is OK to shave your face.  Please follow these instructions carefully.   1. Shower the NIGHT BEFORE SURGERY and the MORNING OF SURGERY with CHG Soap.   2. If you chose to wash your hair, wash your hair first as usual with your normal shampoo.  3. After you shampoo, rinse your hair and body thoroughly to remove the shampoo.  4. Use CHG as you would any other liquid soap. You can apply CHG directly to the skin and wash gently with a scrungie or a clean washcloth.   5. Apply the CHG Soap to your body ONLY FROM THE NECK DOWN.  Do not use on open wounds or open sores. Avoid contact with your eyes, ears, mouth and genitals (private parts). Wash Face and genitals (private parts)  with your normal soap.   6. Wash thoroughly, paying special attention to the area where your surgery will be performed.  7. Thoroughly rinse your body with warm water from the neck down.  8. DO NOT shower/wash with your normal soap after using and rinsing off the CHG Soap.  9. Pat yourself dry with a CLEAN TOWEL.  10. Wear CLEAN PAJAMAS to bed the night before surgery, wear  comfortable clothes the morning of surgery  11. Place CLEAN SHEETS on your bed the night of your first shower and DO NOT SLEEP WITH PETS.   Day of Surgery:  Do not apply any deodorants/lotions.  Please wear clean clothes to the hospital/surgery center.   Remember to brush your teeth WITH YOUR REGULAR TOOTHPASTE.   Please read over the following fact sheets that you were given.

## 2018-09-13 ENCOUNTER — Encounter (HOSPITAL_COMMUNITY)
Admission: RE | Admit: 2018-09-13 | Discharge: 2018-09-13 | Disposition: A | Discharge status: Home / Self Care | Payer: 59 | Source: Ambulatory Visit | Attending: Thoracic Surgery (Cardiothoracic Vascular Surgery) | Admitting: Thoracic Surgery (Cardiothoracic Vascular Surgery)

## 2018-09-13 ENCOUNTER — Other Ambulatory Visit (HOSPITAL_COMMUNITY)
Admission: RE | Admit: 2018-09-13 | Discharge: 2018-09-13 | Disposition: A | Discharge status: Home / Self Care | Payer: 59 | Source: Ambulatory Visit | Attending: Thoracic Surgery (Cardiothoracic Vascular Surgery) | Admitting: Thoracic Surgery (Cardiothoracic Vascular Surgery)

## 2018-09-13 ENCOUNTER — Ambulatory Visit (HOSPITAL_BASED_OUTPATIENT_CLINIC_OR_DEPARTMENT_OTHER)
Admission: RE | Admit: 2018-09-13 | Discharge: 2018-09-13 | Disposition: A | Discharge status: Home / Self Care | Payer: 59 | Source: Ambulatory Visit | Attending: Thoracic Surgery (Cardiothoracic Vascular Surgery) | Admitting: Thoracic Surgery (Cardiothoracic Vascular Surgery)

## 2018-09-13 ENCOUNTER — Other Ambulatory Visit: Payer: Self-pay

## 2018-09-13 ENCOUNTER — Encounter (HOSPITAL_COMMUNITY): Payer: Self-pay

## 2018-09-13 DIAGNOSIS — J9811 Atelectasis: Secondary | ICD-10-CM | POA: Diagnosis not present

## 2018-09-13 DIAGNOSIS — D62 Acute posthemorrhagic anemia: Secondary | ICD-10-CM | POA: Diagnosis not present

## 2018-09-13 DIAGNOSIS — I5043 Acute on chronic combined systolic (congestive) and diastolic (congestive) heart failure: Secondary | ICD-10-CM | POA: Diagnosis not present

## 2018-09-13 DIAGNOSIS — E785 Hyperlipidemia, unspecified: Secondary | ICD-10-CM | POA: Diagnosis not present

## 2018-09-13 DIAGNOSIS — I351 Nonrheumatic aortic (valve) insufficiency: Secondary | ICD-10-CM | POA: Insufficient documentation

## 2018-09-13 DIAGNOSIS — Z1159 Encounter for screening for other viral diseases: Secondary | ICD-10-CM | POA: Diagnosis not present

## 2018-09-13 DIAGNOSIS — I110 Hypertensive heart disease with heart failure: Secondary | ICD-10-CM | POA: Diagnosis not present

## 2018-09-13 DIAGNOSIS — G4733 Obstructive sleep apnea (adult) (pediatric): Secondary | ICD-10-CM | POA: Diagnosis not present

## 2018-09-13 DIAGNOSIS — I083 Combined rheumatic disorders of mitral, aortic and tricuspid valves: Secondary | ICD-10-CM | POA: Diagnosis not present

## 2018-09-13 DIAGNOSIS — I2510 Atherosclerotic heart disease of native coronary artery without angina pectoris: Secondary | ICD-10-CM | POA: Diagnosis not present

## 2018-09-13 HISTORY — DX: Dyspnea, unspecified: R06.00

## 2018-09-13 HISTORY — DX: Heart failure, unspecified: I50.9

## 2018-09-13 LAB — ABO/RH: ABO/RH(D): A NEG

## 2018-09-13 LAB — PROTIME-INR
INR: 1 (ref 0.8–1.2)
Prothrombin Time: 13 seconds (ref 11.4–15.2)

## 2018-09-13 LAB — URINALYSIS, ROUTINE W REFLEX MICROSCOPIC
Bilirubin Urine: NEGATIVE
Glucose, UA: NEGATIVE mg/dL
Hgb urine dipstick: NEGATIVE
Ketones, ur: NEGATIVE mg/dL
Leukocytes,Ua: NEGATIVE
Nitrite: NEGATIVE
Protein, ur: NEGATIVE mg/dL
Specific Gravity, Urine: 1.027 (ref 1.005–1.030)
pH: 5 (ref 5.0–8.0)

## 2018-09-13 LAB — BLOOD GAS, ARTERIAL
Acid-base deficit: 0.4 mmol/L (ref 0.0–2.0)
Bicarbonate: 23.5 mmol/L (ref 20.0–28.0)
Drawn by: 470491
FIO2: 0.21
O2 Saturation: 98.2 %
Patient temperature: 98.6
pCO2 arterial: 36.7 mmHg (ref 32.0–48.0)
pH, Arterial: 7.422 (ref 7.350–7.450)
pO2, Arterial: 110 mmHg — ABNORMAL HIGH (ref 83.0–108.0)

## 2018-09-13 LAB — HEMOGLOBIN A1C
Hgb A1c MFr Bld: 5.7 % — ABNORMAL HIGH (ref 4.8–5.6)
Mean Plasma Glucose: 116.89 mg/dL

## 2018-09-13 LAB — SURGICAL PCR SCREEN
MRSA, PCR: POSITIVE — AB
Staphylococcus aureus: POSITIVE — AB

## 2018-09-13 LAB — TYPE AND SCREEN
ABO/RH(D): A NEG
Antibody Screen: NEGATIVE

## 2018-09-13 LAB — APTT: aPTT: 26 seconds (ref 24–36)

## 2018-09-13 LAB — SARS CORONAVIRUS 2 BY RT PCR (HOSPITAL ORDER, PERFORMED IN ~~LOC~~ HOSPITAL LAB): SARS Coronavirus 2: NEGATIVE

## 2018-09-13 NOTE — Progress Notes (Signed)
PCP -  Cathlean Cower Cardiologist - Denton turner  Chest x-ray - 09/08/18 EKG -  today Stress Test -  ECHO - 09/01/18 Cardiac Cath - 08/11/18  Sleep Study - yrs. ago CPAP -  yes  Fasting Blood Sugar - na Checks Blood Sugar _____ times a day  Blood Thinner Instructions: Aspirin Instructions:continue until day of surgery per Thurmond Butts @ [facility]  Anesthesia review:   Patient denies shortness of breath, fever, cough and chest pain at PAT appointment   Patient verbalized understanding of instructions that were given to them at the PAT appointment. Patient was also instructed that they will need to review over the PAT instructions again at home before surgery.  Cathlean Cower-

## 2018-09-14 ENCOUNTER — Other Ambulatory Visit: Payer: Self-pay | Admitting: *Deleted

## 2018-09-14 ENCOUNTER — Encounter (HOSPITAL_COMMUNITY): Payer: Self-pay | Admitting: Certified Registered Nurse Anesthetist

## 2018-09-14 MED ORDER — INSULIN REGULAR(HUMAN) IN NACL 100-0.9 UT/100ML-% IV SOLN
INTRAVENOUS | Status: AC
  Administered 2018-09-15: .7 [IU]/h via INTRAVENOUS
  Filled 2018-09-14: qty 100

## 2018-09-14 MED ORDER — VANCOMYCIN HCL 10 G IV SOLR
1500.0000 mg | INTRAVENOUS | Status: AC
  Administered 2018-09-15: 1500 mg via INTRAVENOUS
  Filled 2018-09-14: qty 1500

## 2018-09-14 MED ORDER — SODIUM CHLORIDE 0.9 % IV SOLN
INTRAVENOUS | Status: DC
  Filled 2018-09-14: qty 30

## 2018-09-14 MED ORDER — PHENYLEPHRINE HCL-NACL 20-0.9 MG/250ML-% IV SOLN
30.0000 ug/min | INTRAVENOUS | Status: AC
  Administered 2018-09-15: 10 ug/min via INTRAVENOUS
  Filled 2018-09-14: qty 250

## 2018-09-14 MED ORDER — TRANEXAMIC ACID 1000 MG/10ML IV SOLN
1.5000 mg/kg/h | INTRAVENOUS | Status: AC
  Administered 2018-09-15: 1.5 mg/kg/h via INTRAVENOUS
  Filled 2018-09-14: qty 25

## 2018-09-14 MED ORDER — KENNESTONE BLOOD CARDIOPLEGIA (KBC) MANNITOL SYRINGE (20%, 32ML)
32.0000 mL | INTRAVENOUS | Status: DC
  Filled 2018-09-14 (×2): qty 64

## 2018-09-14 MED ORDER — VANCOMYCIN HCL 1000 MG IV SOLR
INTRAVENOUS | Status: DC
  Filled 2018-09-14: qty 1000

## 2018-09-14 MED ORDER — EPINEPHRINE PF 1 MG/ML IJ SOLN
0.0000 ug/min | INTRAVENOUS | Status: DC
  Filled 2018-09-14: qty 4

## 2018-09-14 MED ORDER — DEXMEDETOMIDINE HCL IN NACL 400 MCG/100ML IV SOLN
0.1000 ug/kg/h | INTRAVENOUS | Status: AC
  Administered 2018-09-15: .5 ug/kg/h via INTRAVENOUS
  Filled 2018-09-14: qty 100

## 2018-09-14 MED ORDER — SODIUM CHLORIDE 0.9 % IV SOLN
1.5000 g | INTRAVENOUS | Status: AC
  Administered 2018-09-15: 1.5 g via INTRAVENOUS
  Filled 2018-09-14 (×2): qty 1.5

## 2018-09-14 MED ORDER — MAGNESIUM SULFATE 50 % IJ SOLN
40.0000 meq | INTRAMUSCULAR | Status: DC
  Filled 2018-09-14: qty 9.85

## 2018-09-14 MED ORDER — KENNESTONE BLOOD CARDIOPLEGIA VIAL
13.0000 mL | Status: DC
  Filled 2018-09-14 (×2): qty 26

## 2018-09-14 MED ORDER — GLUTARALDEHYDE 0.625% SOAKING SOLUTION
TOPICAL | Status: DC
  Filled 2018-09-14: qty 50

## 2018-09-14 MED ORDER — NITROGLYCERIN IN D5W 200-5 MCG/ML-% IV SOLN
2.0000 ug/min | INTRAVENOUS | Status: AC
  Administered 2018-09-15: 20 ug/min via INTRAVENOUS
  Filled 2018-09-14: qty 250

## 2018-09-14 MED ORDER — TRANEXAMIC ACID (OHS) BOLUS VIA INFUSION
15.0000 mg/kg | INTRAVENOUS | Status: AC
  Administered 2018-09-15: 08:00:00 1416 mg via INTRAVENOUS
  Filled 2018-09-14: qty 1416

## 2018-09-14 MED ORDER — MILRINONE LACTATE IN DEXTROSE 20-5 MG/100ML-% IV SOLN
0.3000 ug/kg/min | INTRAVENOUS | Status: DC
  Filled 2018-09-14: qty 100

## 2018-09-14 MED ORDER — DOPAMINE-DEXTROSE 3.2-5 MG/ML-% IV SOLN
0.0000 ug/kg/min | INTRAVENOUS | Status: DC
  Filled 2018-09-14: qty 250

## 2018-09-14 MED ORDER — POTASSIUM CHLORIDE 2 MEQ/ML IV SOLN
80.0000 meq | INTRAVENOUS | Status: DC
  Filled 2018-09-14: qty 40

## 2018-09-14 MED ORDER — PLASMA-LYTE 148 IV SOLN
INTRAVENOUS | Status: DC
  Filled 2018-09-14: qty 2.5

## 2018-09-14 MED ORDER — SODIUM CHLORIDE 0.9 % IV SOLN
750.0000 mg | INTRAVENOUS | Status: AC
  Administered 2018-09-15: 750 mg via INTRAVENOUS
  Filled 2018-09-14: qty 750

## 2018-09-14 MED ORDER — TRANEXAMIC ACID (OHS) PUMP PRIME SOLUTION
2.0000 mg/kg | INTRAVENOUS | Status: DC
  Filled 2018-09-14: qty 1.89

## 2018-09-14 NOTE — Patient Outreach (Signed)
[facility]) Care Management  09/14/2018  Isaac Patel [birth year] [id number]   Preoperative Screening Call Referral received: 09/07/18 Surgery/procedure date: 09/15/18 Insurance: [facility] Choice Plan  Subjective:  Initial successful telephone call to patient's preferred (home)  number in order to complete preoperative screening. 2 HIPAA identifiers verified. Discussed purpose of preoperative call. Patient voices understanding and agrees to call. He states he understands the reason for the surgery and the expected time of arrival. He says he/she completed his preoperative testing on 09/13/18 and has no additional questions. He says he expects to be in the hospital 2-3 days.  He states he has completed medical leave paperwork and initiated short term disability and hospital indemnity claims. He says he will have 24/7 care at home provided by his wife to assist in his recovery. He has completed advance directives.  He agrees to a post hospital discharge transition of care call.    Objective:  Per chart review, patient scheduled for aortic value repair or replacement on 09/15/18 at [facility]. He completed pre-op testing on 6/17. His Hgb A1C was 5.7%, previously 6.1 and 5.9%.   Assessment: Preoperative call completed, no preoperative needs identified.   Plan: RNCM will call patient for transition of care outreach within 72 hours of hospital discharge notification.  Barrington Ellison RN,CCM,CDE [place] Management Coordinator Office Phone [phone] Office Fax [phone]

## 2018-09-15 ENCOUNTER — Inpatient Hospital Stay (HOSPITAL_COMMUNITY): Payer: 59

## 2018-09-15 ENCOUNTER — Encounter (HOSPITAL_COMMUNITY)
Admission: RE | Disposition: A | Discharge status: Home / Self Care | Payer: Self-pay | Source: Home / Self Care | Attending: Thoracic Surgery (Cardiothoracic Vascular Surgery)

## 2018-09-15 ENCOUNTER — Inpatient Hospital Stay (HOSPITAL_COMMUNITY): Payer: 59 | Admitting: Anesthesiology

## 2018-09-15 ENCOUNTER — Other Ambulatory Visit: Payer: Self-pay

## 2018-09-15 ENCOUNTER — Inpatient Hospital Stay (HOSPITAL_COMMUNITY): Payer: 59 | Admitting: Vascular Surgery

## 2018-09-15 ENCOUNTER — Inpatient Hospital Stay (HOSPITAL_COMMUNITY)
Admission: RE | Admit: 2018-09-15 | Discharge: 2018-09-18 | DRG: 219 | Disposition: A | Discharge status: Home / Self Care | Payer: 59 | Attending: Thoracic Surgery (Cardiothoracic Vascular Surgery) | Admitting: Thoracic Surgery (Cardiothoracic Vascular Surgery)

## 2018-09-15 ENCOUNTER — Encounter (HOSPITAL_COMMUNITY): Payer: Self-pay

## 2018-09-15 DIAGNOSIS — K219 Gastro-esophageal reflux disease without esophagitis: Secondary | ICD-10-CM | POA: Diagnosis present

## 2018-09-15 DIAGNOSIS — I083 Combined rheumatic disorders of mitral, aortic and tricuspid valves: Secondary | ICD-10-CM | POA: Diagnosis present

## 2018-09-15 DIAGNOSIS — I351 Nonrheumatic aortic (valve) insufficiency: Secondary | ICD-10-CM | POA: Diagnosis not present

## 2018-09-15 DIAGNOSIS — I700 Atherosclerosis of aorta: Secondary | ICD-10-CM | POA: Diagnosis present

## 2018-09-15 DIAGNOSIS — Z820 Family history of epilepsy and other diseases of the nervous system: Secondary | ICD-10-CM

## 2018-09-15 DIAGNOSIS — I5043 Acute on chronic combined systolic (congestive) and diastolic (congestive) heart failure: Secondary | ICD-10-CM | POA: Diagnosis not present

## 2018-09-15 DIAGNOSIS — Z9889 Other specified postprocedural states: Secondary | ICD-10-CM

## 2018-09-15 DIAGNOSIS — G4733 Obstructive sleep apnea (adult) (pediatric): Secondary | ICD-10-CM | POA: Diagnosis present

## 2018-09-15 DIAGNOSIS — J9811 Atelectasis: Secondary | ICD-10-CM | POA: Diagnosis not present

## 2018-09-15 DIAGNOSIS — I252 Old myocardial infarction: Secondary | ICD-10-CM

## 2018-09-15 DIAGNOSIS — Z885 Allergy status to narcotic agent status: Secondary | ICD-10-CM | POA: Diagnosis not present

## 2018-09-15 DIAGNOSIS — Z7982 Long term (current) use of aspirin: Secondary | ICD-10-CM

## 2018-09-15 DIAGNOSIS — I110 Hypertensive heart disease with heart failure: Secondary | ICD-10-CM | POA: Diagnosis present

## 2018-09-15 DIAGNOSIS — I10 Essential (primary) hypertension: Secondary | ICD-10-CM | POA: Diagnosis not present

## 2018-09-15 DIAGNOSIS — Z955 Presence of coronary angioplasty implant and graft: Secondary | ICD-10-CM

## 2018-09-15 DIAGNOSIS — Z1159 Encounter for screening for other viral diseases: Secondary | ICD-10-CM | POA: Diagnosis not present

## 2018-09-15 DIAGNOSIS — Z79899 Other long term (current) drug therapy: Secondary | ICD-10-CM

## 2018-09-15 DIAGNOSIS — Z808 Family history of malignant neoplasm of other organs or systems: Secondary | ICD-10-CM | POA: Diagnosis not present

## 2018-09-15 DIAGNOSIS — Z89212 Acquired absence of left upper limb below elbow: Secondary | ICD-10-CM | POA: Diagnosis not present

## 2018-09-15 DIAGNOSIS — R0602 Shortness of breath: Secondary | ICD-10-CM | POA: Diagnosis present

## 2018-09-15 DIAGNOSIS — E785 Hyperlipidemia, unspecified: Secondary | ICD-10-CM | POA: Diagnosis not present

## 2018-09-15 DIAGNOSIS — F17210 Nicotine dependence, cigarettes, uncomplicated: Secondary | ICD-10-CM | POA: Diagnosis present

## 2018-09-15 DIAGNOSIS — D62 Acute posthemorrhagic anemia: Secondary | ICD-10-CM | POA: Diagnosis not present

## 2018-09-15 DIAGNOSIS — I2510 Atherosclerotic heart disease of native coronary artery without angina pectoris: Secondary | ICD-10-CM | POA: Diagnosis not present

## 2018-09-15 DIAGNOSIS — I517 Cardiomegaly: Secondary | ICD-10-CM | POA: Diagnosis not present

## 2018-09-15 DIAGNOSIS — I350 Nonrheumatic aortic (valve) stenosis: Secondary | ICD-10-CM | POA: Diagnosis not present

## 2018-09-15 DIAGNOSIS — I340 Nonrheumatic mitral (valve) insufficiency: Secondary | ICD-10-CM | POA: Diagnosis not present

## 2018-09-15 HISTORY — PX: AORTIC VALVE REPAIR: SHX6306

## 2018-09-15 HISTORY — PX: TEE WITHOUT CARDIOVERSION: SHX5443

## 2018-09-15 HISTORY — DX: Other specified postprocedural states: Z98.890

## 2018-09-15 LAB — POCT I-STAT 7, (LYTES, BLD GAS, ICA,H+H)
Acid-base deficit: 1 mmol/L (ref 0.0–2.0)
Acid-base deficit: 2 mmol/L (ref 0.0–2.0)
Acid-base deficit: 6 mmol/L — ABNORMAL HIGH (ref 0.0–2.0)
Acid-base deficit: 2 mmol/L (ref 0.0–2.0)
Bicarbonate: 23.8 mmol/L (ref 20.0–28.0)
Bicarbonate: 24.4 mmol/L (ref 20.0–28.0)
Bicarbonate: 23.1 mmol/L (ref 20.0–28.0)
Bicarbonate: 18 mmol/L — ABNORMAL LOW (ref 20.0–28.0)
Bicarbonate: 22.3 mmol/L (ref 20.0–28.0)
Calcium, Ion: 0.99 mmol/L — ABNORMAL LOW (ref 1.15–1.40)
Calcium, Ion: 1.13 mmol/L — ABNORMAL LOW (ref 1.15–1.40)
Calcium, Ion: 1.18 mmol/L (ref 1.15–1.40)
Calcium, Ion: 1.02 mmol/L — ABNORMAL LOW (ref 1.15–1.40)
Calcium, Ion: 1.08 mmol/L — ABNORMAL LOW (ref 1.15–1.40)
HCT: 32 % — ABNORMAL LOW (ref 39.0–52.0)
HCT: 32 % — ABNORMAL LOW (ref 39.0–52.0)
HCT: 31 % — ABNORMAL LOW (ref 39.0–52.0)
HCT: 33 % — ABNORMAL LOW (ref 39.0–52.0)
HCT: 37 % — ABNORMAL LOW (ref 39.0–52.0)
Hemoglobin: 10.9 g/dL — ABNORMAL LOW (ref 13.0–17.0)
Hemoglobin: 10.9 g/dL — ABNORMAL LOW (ref 13.0–17.0)
Hemoglobin: 10.5 g/dL — ABNORMAL LOW (ref 13.0–17.0)
Hemoglobin: 11.2 g/dL — ABNORMAL LOW (ref 13.0–17.0)
Hemoglobin: 12.6 g/dL — ABNORMAL LOW (ref 13.0–17.0)
O2 Saturation: 100 %
O2 Saturation: 98 %
O2 Saturation: 97 %
O2 Saturation: 96 %
O2 Saturation: 96 %
Patient temperature: 34.6
Patient temperature: 37
Patient temperature: 36.5
Potassium: 4.1 mmol/L (ref 3.5–5.1)
Potassium: 4.1 mmol/L (ref 3.5–5.1)
Potassium: 4 mmol/L (ref 3.5–5.1)
Potassium: 3.6 mmol/L (ref 3.5–5.1)
Potassium: 3.9 mmol/L (ref 3.5–5.1)
Sodium: 143 mmol/L (ref 135–145)
Sodium: 140 mmol/L (ref 135–145)
Sodium: 141 mmol/L (ref 135–145)
Sodium: 144 mmol/L (ref 135–145)
Sodium: 143 mmol/L (ref 135–145)
TCO2: 25 mmol/L (ref 22–32)
TCO2: 26 mmol/L (ref 22–32)
TCO2: 24 mmol/L (ref 22–32)
TCO2: 19 mmol/L — ABNORMAL LOW (ref 22–32)
TCO2: 23 mmol/L (ref 22–32)
pCO2 arterial: 39 mmHg (ref 32.0–48.0)
pCO2 arterial: 39.8 mmHg (ref 32.0–48.0)
pCO2 arterial: 37.6 mmHg (ref 32.0–48.0)
pCO2 arterial: 29.1 mmHg — ABNORMAL LOW (ref 32.0–48.0)
pCO2 arterial: 35.4 mmHg (ref 32.0–48.0)
pH, Arterial: 7.393 (ref 7.350–7.450)
pH, Arterial: 7.396 (ref 7.350–7.450)
pH, Arterial: 7.386 (ref 7.350–7.450)
pH, Arterial: 7.398 (ref 7.350–7.450)
pH, Arterial: 7.406 (ref 7.350–7.450)
pO2, Arterial: 407 mmHg — ABNORMAL HIGH (ref 83.0–108.0)
pO2, Arterial: 108 mmHg (ref 83.0–108.0)
pO2, Arterial: 86 mmHg (ref 83.0–108.0)
pO2, Arterial: 77 mmHg — ABNORMAL LOW (ref 83.0–108.0)
pO2, Arterial: 80 mmHg — ABNORMAL LOW (ref 83.0–108.0)

## 2018-09-15 LAB — POCT I-STAT, CHEM 8
BUN: 13 mg/dL (ref 6–20)
Calcium, Ion: 1.07 mmol/L — ABNORMAL LOW (ref 1.15–1.40)
Chloride: 108 mmol/L (ref 98–111)
Creatinine, Ser: 0.7 mg/dL (ref 0.61–1.24)
Glucose, Bld: 140 mg/dL — ABNORMAL HIGH (ref 70–99)
HCT: 35 % — ABNORMAL LOW (ref 39.0–52.0)
Hemoglobin: 11.9 g/dL — ABNORMAL LOW (ref 13.0–17.0)
Potassium: 3.9 mmol/L (ref 3.5–5.1)
Sodium: 140 mmol/L (ref 135–145)
TCO2: 20 mmol/L — ABNORMAL LOW (ref 22–32)

## 2018-09-15 LAB — ECHO INTRAOPERATIVE TEE
Height: 71 in
Weight: 3328 oz

## 2018-09-15 LAB — POCT I-STAT 4, (NA,K, GLUC, HGB,HCT)
Glucose, Bld: 93 mg/dL (ref 70–99)
Glucose, Bld: 91 mg/dL (ref 70–99)
Glucose, Bld: 99 mg/dL (ref 70–99)
Glucose, Bld: 137 mg/dL — ABNORMAL HIGH (ref 70–99)
Glucose, Bld: 137 mg/dL — ABNORMAL HIGH (ref 70–99)
Glucose, Bld: 121 mg/dL — ABNORMAL HIGH (ref 70–99)
HCT: 37 % — ABNORMAL LOW (ref 39.0–52.0)
HCT: 33 % — ABNORMAL LOW (ref 39.0–52.0)
HCT: 37 % — ABNORMAL LOW (ref 39.0–52.0)
HCT: 32 % — ABNORMAL LOW (ref 39.0–52.0)
HCT: 33 % — ABNORMAL LOW (ref 39.0–52.0)
HCT: 33 % — ABNORMAL LOW (ref 39.0–52.0)
Hemoglobin: 12.6 g/dL — ABNORMAL LOW (ref 13.0–17.0)
Hemoglobin: 11.2 g/dL — ABNORMAL LOW (ref 13.0–17.0)
Hemoglobin: 12.6 g/dL — ABNORMAL LOW (ref 13.0–17.0)
Hemoglobin: 10.9 g/dL — ABNORMAL LOW (ref 13.0–17.0)
Hemoglobin: 11.2 g/dL — ABNORMAL LOW (ref 13.0–17.0)
Hemoglobin: 11.2 g/dL — ABNORMAL LOW (ref 13.0–17.0)
Potassium: 3.8 mmol/L (ref 3.5–5.1)
Potassium: 3.9 mmol/L (ref 3.5–5.1)
Potassium: 4 mmol/L (ref 3.5–5.1)
Potassium: 4.5 mmol/L (ref 3.5–5.1)
Potassium: 4 mmol/L (ref 3.5–5.1)
Potassium: 3.9 mmol/L (ref 3.5–5.1)
Sodium: 141 mmol/L (ref 135–145)
Sodium: 141 mmol/L (ref 135–145)
Sodium: 143 mmol/L (ref 135–145)
Sodium: 139 mmol/L (ref 135–145)
Sodium: 140 mmol/L (ref 135–145)
Sodium: 141 mmol/L (ref 135–145)

## 2018-09-15 LAB — HEMOGLOBIN AND HEMATOCRIT, BLOOD
HCT: 33.7 % — ABNORMAL LOW (ref 39.0–52.0)
Hemoglobin: 11.7 g/dL — ABNORMAL LOW (ref 13.0–17.0)

## 2018-09-15 LAB — GLUCOSE, CAPILLARY
Glucose-Capillary: 130 mg/dL — ABNORMAL HIGH (ref 70–99)
Glucose-Capillary: 175 mg/dL — ABNORMAL HIGH (ref 70–99)
Glucose-Capillary: 150 mg/dL — ABNORMAL HIGH (ref 70–99)
Glucose-Capillary: 162 mg/dL — ABNORMAL HIGH (ref 70–99)
Glucose-Capillary: 132 mg/dL — ABNORMAL HIGH (ref 70–99)
Glucose-Capillary: 142 mg/dL — ABNORMAL HIGH (ref 70–99)
Glucose-Capillary: 176 mg/dL — ABNORMAL HIGH (ref 70–99)
Glucose-Capillary: 161 mg/dL — ABNORMAL HIGH (ref 70–99)
Glucose-Capillary: 129 mg/dL — ABNORMAL HIGH (ref 70–99)
Glucose-Capillary: 125 mg/dL — ABNORMAL HIGH (ref 70–99)
Glucose-Capillary: 107 mg/dL — ABNORMAL HIGH (ref 70–99)

## 2018-09-15 LAB — APTT: aPTT: 28 seconds (ref 24–36)

## 2018-09-15 LAB — CBC
HCT: 35 % — ABNORMAL LOW (ref 39.0–52.0)
HCT: 38 % — ABNORMAL LOW (ref 39.0–52.0)
Hemoglobin: 12 g/dL — ABNORMAL LOW (ref 13.0–17.0)
Hemoglobin: 13.1 g/dL (ref 13.0–17.0)
MCH: 31.9 pg (ref 26.0–34.0)
MCH: 32 pg (ref 26.0–34.0)
MCHC: 34.3 g/dL (ref 30.0–36.0)
MCHC: 34.5 g/dL (ref 30.0–36.0)
MCV: 93.1 fL (ref 80.0–100.0)
MCV: 92.9 fL (ref 80.0–100.0)
Platelets: 119 10*3/uL — ABNORMAL LOW (ref 150–400)
Platelets: 181 10*3/uL (ref 150–400)
RBC: 3.76 MIL/uL — ABNORMAL LOW (ref 4.22–5.81)
RBC: 4.09 MIL/uL — ABNORMAL LOW (ref 4.22–5.81)
RDW: 12.3 % (ref 11.5–15.5)
RDW: 12.4 % (ref 11.5–15.5)
WBC: 13.7 10*3/uL — ABNORMAL HIGH (ref 4.0–10.5)
WBC: 18 10*3/uL — ABNORMAL HIGH (ref 4.0–10.5)
nRBC: 0 % (ref 0.0–0.2)
nRBC: 0 % (ref 0.0–0.2)

## 2018-09-15 LAB — CREATININE, SERUM
Creatinine, Ser: 1.02 mg/dL (ref 0.61–1.24)
GFR calc Af Amer: >60 mL/min (ref >60)
GFR calc non Af Amer: >60 mL/min (ref >60)

## 2018-09-15 LAB — PROTIME-INR
INR: 1.4 — ABNORMAL HIGH (ref 0.8–1.2)
Prothrombin Time: 17.3 seconds — ABNORMAL HIGH (ref 11.4–15.2)

## 2018-09-15 LAB — MAGNESIUM: Magnesium: 3 mg/dL — ABNORMAL HIGH (ref 1.7–2.4)

## 2018-09-15 LAB — PLATELET COUNT: Platelets: 127 10*3/uL — ABNORMAL LOW (ref 150–400)

## 2018-09-15 SURGERY — REPAIR, AORTIC VALVE
Anesthesia: General | Site: Chest

## 2018-09-15 MED ORDER — ARTIFICIAL TEARS OPHTHALMIC OINT
TOPICAL_OINTMENT | OPHTHALMIC | Status: AC
  Filled 2018-09-15: qty 3.5

## 2018-09-15 MED ORDER — OXYCODONE HCL 5 MG PO TABS
5.0000 mg | ORAL_TABLET | ORAL | Status: DC | PRN
  Administered 2018-09-15 – 2018-09-16 (×6): 10 mg via ORAL
  Administered 2018-09-16: 5 mg via ORAL
  Administered 2018-09-17 (×3): 10 mg via ORAL
  Administered 2018-09-17: 5 mg via ORAL
  Administered 2018-09-17 – 2018-09-18 (×4): 10 mg via ORAL
  Filled 2018-09-15 (×2): qty 2
  Filled 2018-09-15: qty 1
  Filled 2018-09-15 (×12): qty 2

## 2018-09-15 MED ORDER — ASPIRIN EC 325 MG PO TBEC
325.0000 mg | DELAYED_RELEASE_TABLET | Freq: Every day | ORAL | Status: DC
  Administered 2018-09-16: 325 mg via ORAL
  Filled 2018-09-15 (×2): qty 1

## 2018-09-15 MED ORDER — PROTAMINE SULFATE 10 MG/ML IV SOLN
INTRAVENOUS | Status: DC | PRN
  Administered 2018-09-15: 100 mg via INTRAVENOUS
  Administered 2018-09-15: 50 mg via INTRAVENOUS
  Administered 2018-09-15: 30 mg via INTRAVENOUS
  Administered 2018-09-15: 50 mg via INTRAVENOUS
  Administered 2018-09-15: 30 mg via INTRAVENOUS
  Administered 2018-09-15: 50 mg via INTRAVENOUS
  Administered 2018-09-15: 30 mg via INTRAVENOUS

## 2018-09-15 MED ORDER — DEXAMETHASONE SODIUM PHOSPHATE 10 MG/ML IJ SOLN
INTRAMUSCULAR | Status: DC | PRN
  Administered 2018-09-15: 10 mg via INTRAVENOUS

## 2018-09-15 MED ORDER — ACETAMINOPHEN 500 MG PO TABS
1000.0000 mg | ORAL_TABLET | Freq: Four times a day (QID) | ORAL | Status: DC
  Administered 2018-09-16 – 2018-09-18 (×9): 1000 mg via ORAL
  Filled 2018-09-15 (×11): qty 2

## 2018-09-15 MED ORDER — PROTAMINE SULFATE 10 MG/ML IV SOLN
INTRAVENOUS | Status: AC
  Filled 2018-09-15: qty 25

## 2018-09-15 MED ORDER — VANCOMYCIN HCL 1000 MG IV SOLR
INTRAVENOUS | Status: DC | PRN
  Administered 2018-09-15: 1000 mL

## 2018-09-15 MED ORDER — LIDOCAINE 2% (20 MG/ML) 5 ML SYRINGE
INTRAMUSCULAR | Status: AC
  Filled 2018-09-15: qty 5

## 2018-09-15 MED ORDER — ACETAMINOPHEN 650 MG RE SUPP
650.0000 mg | Freq: Once | RECTAL | Status: AC
  Administered 2018-09-15: 650 mg via RECTAL

## 2018-09-15 MED ORDER — LACTATED RINGERS IV SOLN
INTRAVENOUS | Status: DC
  Administered 2018-09-16: 17:00:00 500 mL via INTRAVENOUS

## 2018-09-15 MED ORDER — ONDANSETRON HCL 4 MG/2ML IJ SOLN: 4.0000 mg | Freq: Four times a day (QID) | INTRAMUSCULAR | Status: DC | PRN

## 2018-09-15 MED ORDER — POTASSIUM CHLORIDE 10 MEQ/50ML IV SOLN
10.0000 meq | INTRAVENOUS | Status: AC
  Administered 2018-09-15 (×2): 10 meq via INTRAVENOUS

## 2018-09-15 MED ORDER — SODIUM CHLORIDE 0.9 % IV SOLN: 250.0000 mL | INTRAVENOUS | Status: DC

## 2018-09-15 MED ORDER — FENTANYL CITRATE (PF) 250 MCG/5ML IJ SOLN
INTRAMUSCULAR | Status: AC
  Filled 2018-09-15: qty 25

## 2018-09-15 MED ORDER — LACTATED RINGERS IV SOLN
INTRAVENOUS | Status: DC | PRN
  Administered 2018-09-15 (×2): via INTRAVENOUS

## 2018-09-15 MED ORDER — SODIUM CHLORIDE 0.9 % IV SOLN
INTRAVENOUS | Status: DC | PRN
  Administered 2018-09-15: 12:00:00 via INTRAVENOUS

## 2018-09-15 MED ORDER — PANTOPRAZOLE SODIUM 40 MG PO TBEC
40.0000 mg | DELAYED_RELEASE_TABLET | Freq: Every day | ORAL | Status: DC
  Filled 2018-09-15: qty 1

## 2018-09-15 MED ORDER — ROCURONIUM BROMIDE 10 MG/ML (PF) SYRINGE
PREFILLED_SYRINGE | INTRAVENOUS | Status: AC
  Filled 2018-09-15: qty 10

## 2018-09-15 MED ORDER — DEXMEDETOMIDINE HCL IN NACL 200 MCG/50ML IV SOLN
0.0000 ug/kg/h | INTRAVENOUS | Status: DC
  Administered 2018-09-16: 0.2 ug/kg/h via INTRAVENOUS
  Filled 2018-09-15: qty 50

## 2018-09-15 MED ORDER — DEXAMETHASONE SODIUM PHOSPHATE 10 MG/ML IJ SOLN
INTRAMUSCULAR | Status: AC
  Filled 2018-09-15: qty 1

## 2018-09-15 MED ORDER — SODIUM CHLORIDE 0.9% FLUSH: 3.0000 mL | INTRAVENOUS | Status: DC | PRN

## 2018-09-15 MED ORDER — ACETAMINOPHEN 160 MG/5ML PO SOLN: 650.0000 mg | Freq: Once | ORAL | Status: AC

## 2018-09-15 MED ORDER — METOPROLOL TARTRATE 12.5 MG HALF TABLET
12.5000 mg | ORAL_TABLET | Freq: Once | ORAL | Status: DC
  Filled 2018-09-15: qty 1

## 2018-09-15 MED ORDER — LACTATED RINGERS IV SOLN
INTRAVENOUS | Status: DC | PRN
  Administered 2018-09-15: 07:00:00 via INTRAVENOUS

## 2018-09-15 MED ORDER — FENTANYL CITRATE (PF) 250 MCG/5ML IJ SOLN
INTRAMUSCULAR | Status: DC | PRN
  Administered 2018-09-15: 250 ug via INTRAVENOUS
  Administered 2018-09-15: 50 ug via INTRAVENOUS
  Administered 2018-09-15: 250 ug via INTRAVENOUS
  Administered 2018-09-15: 100 ug via INTRAVENOUS
  Administered 2018-09-15: 150 ug via INTRAVENOUS
  Administered 2018-09-15: 250 ug via INTRAVENOUS
  Administered 2018-09-15: 50 ug via INTRAVENOUS
  Administered 2018-09-15: 100 ug via INTRAVENOUS
  Administered 2018-09-15: 50 ug via INTRAVENOUS

## 2018-09-15 MED ORDER — BISACODYL 5 MG PO TBEC
10.0000 mg | DELAYED_RELEASE_TABLET | Freq: Every day | ORAL | Status: DC
  Administered 2018-09-16 – 2018-09-18 (×3): 10 mg via ORAL
  Filled 2018-09-15 (×3): qty 2

## 2018-09-15 MED ORDER — HEPARIN SODIUM (PORCINE) 1000 UNIT/ML IJ SOLN
INTRAMUSCULAR | Status: AC
  Filled 2018-09-15: qty 1

## 2018-09-15 MED ORDER — LACTATED RINGERS IV SOLN: 500.0000 mL | Freq: Once | INTRAVENOUS | Status: DC | PRN

## 2018-09-15 MED ORDER — PROPOFOL 10 MG/ML IV BOLUS
INTRAVENOUS | Status: AC
  Filled 2018-09-15: qty 20

## 2018-09-15 MED ORDER — CHLORHEXIDINE GLUCONATE 0.12 % MT SOLN
15.0000 mL | Freq: Once | OROMUCOSAL | Status: AC
  Administered 2018-09-15: 06:00:00 15 mL via OROMUCOSAL
  Filled 2018-09-15: qty 15

## 2018-09-15 MED ORDER — ROCURONIUM BROMIDE 10 MG/ML (PF) SYRINGE
PREFILLED_SYRINGE | INTRAVENOUS | Status: DC | PRN
  Administered 2018-09-15 (×4): 50 mg via INTRAVENOUS

## 2018-09-15 MED ORDER — SODIUM CHLORIDE 0.9 % IV SOLN
INTRAVENOUS | Status: DC
  Administered 2018-09-15: 12:00:00 via INTRAVENOUS

## 2018-09-15 MED ORDER — HYDRALAZINE HCL 20 MG/ML IJ SOLN
INTRAMUSCULAR | Status: AC
  Filled 2018-09-15: qty 1

## 2018-09-15 MED ORDER — KETOROLAC TROMETHAMINE 15 MG/ML IJ SOLN
15.0000 mg | Freq: Four times a day (QID) | INTRAMUSCULAR | Status: AC
  Administered 2018-09-15 – 2018-09-16 (×5): 15 mg via INTRAVENOUS
  Filled 2018-09-15 (×5): qty 1

## 2018-09-15 MED ORDER — SODIUM CHLORIDE 0.9 % IV SOLN
INTRAVENOUS | Status: DC | PRN
  Administered 2018-09-15: 20 ug/min via INTRAVENOUS

## 2018-09-15 MED ORDER — INSULIN REGULAR BOLUS VIA INFUSION
0.0000 [IU] | Freq: Three times a day (TID) | INTRAVENOUS | Status: DC
  Filled 2018-09-15: qty 10

## 2018-09-15 MED ORDER — CHLORHEXIDINE GLUCONATE 4 % EX LIQD: 30.0000 mL | CUTANEOUS | Status: DC

## 2018-09-15 MED ORDER — METOPROLOL TARTRATE 5 MG/5ML IV SOLN
2.5000 mg | INTRAVENOUS | Status: DC | PRN
  Administered 2018-09-15: 5 mg via INTRAVENOUS

## 2018-09-15 MED ORDER — INSULIN REGULAR(HUMAN) IN NACL 100-0.9 UT/100ML-% IV SOLN
INTRAVENOUS | Status: DC
  Administered 2018-09-15: 0.6 [IU]/h via INTRAVENOUS

## 2018-09-15 MED ORDER — EPHEDRINE 5 MG/ML INJ
INTRAVENOUS | Status: AC
  Filled 2018-09-15: qty 10

## 2018-09-15 MED ORDER — LABETALOL HCL 5 MG/ML IV SOLN
INTRAVENOUS | Status: AC
  Filled 2018-09-15: qty 4

## 2018-09-15 MED ORDER — SODIUM CHLORIDE (PF) 0.9 % IJ SOLN
INTRAMUSCULAR | Status: AC
  Filled 2018-09-15: qty 10

## 2018-09-15 MED ORDER — NOREPINEPHRINE 4 MG/250ML-% IV SOLN
0.0000 ug/min | INTRAVENOUS | Status: DC
  Filled 2018-09-15: qty 250

## 2018-09-15 MED ORDER — ACETAMINOPHEN 160 MG/5ML PO SOLN: 1000.0000 mg | Freq: Four times a day (QID) | ORAL | Status: DC

## 2018-09-15 MED ORDER — VANCOMYCIN HCL IN DEXTROSE 1-5 GM/200ML-% IV SOLN
1000.0000 mg | Freq: Once | INTRAVENOUS | Status: AC
  Administered 2018-09-15: 1000 mg via INTRAVENOUS
  Filled 2018-09-15: qty 200

## 2018-09-15 MED ORDER — PROTAMINE SULFATE 10 MG/ML IV SOLN
INTRAVENOUS | Status: AC
  Filled 2018-09-15: qty 5

## 2018-09-15 MED ORDER — SODIUM CHLORIDE 0.9 % IV SOLN
1.5000 g | Freq: Two times a day (BID) | INTRAVENOUS | Status: AC
  Administered 2018-09-15 – 2018-09-17 (×4): 1.5 g via INTRAVENOUS
  Filled 2018-09-15 (×4): qty 1.5

## 2018-09-15 MED ORDER — MAGNESIUM SULFATE 4 GM/100ML IV SOLN
4.0000 g | Freq: Once | INTRAVENOUS | Status: AC
  Administered 2018-09-15: 13:00:00 4 g via INTRAVENOUS
  Filled 2018-09-15: qty 100

## 2018-09-15 MED ORDER — HEMOSTATIC AGENTS (NO CHARGE) OPTIME
TOPICAL | Status: DC | PRN
  Administered 2018-09-15: 1 via TOPICAL

## 2018-09-15 MED ORDER — PHENYLEPHRINE 40 MCG/ML (10ML) SYRINGE FOR IV PUSH (FOR BLOOD PRESSURE SUPPORT)
PREFILLED_SYRINGE | INTRAVENOUS | Status: AC
  Filled 2018-09-15: qty 10

## 2018-09-15 MED ORDER — NITROGLYCERIN 0.2 MG/ML ON CALL CATH LAB
INTRAVENOUS | Status: DC | PRN
  Administered 2018-09-15 (×2): 40 ug via INTRAVENOUS

## 2018-09-15 MED ORDER — ASPIRIN 81 MG PO CHEW: 324.0000 mg | CHEWABLE_TABLET | Freq: Every day | ORAL | Status: DC

## 2018-09-15 MED ORDER — FAMOTIDINE IN NACL 20-0.9 MG/50ML-% IV SOLN: 20.0000 mg | Freq: Two times a day (BID) | INTRAVENOUS | Status: DC

## 2018-09-15 MED ORDER — HEPARIN SODIUM (PORCINE) 1000 UNIT/ML IJ SOLN
INTRAMUSCULAR | Status: DC | PRN
  Administered 2018-09-15: 34000 [IU] via INTRAVENOUS

## 2018-09-15 MED ORDER — SUCCINYLCHOLINE CHLORIDE 200 MG/10ML IV SOSY
PREFILLED_SYRINGE | INTRAVENOUS | Status: AC
  Filled 2018-09-15: qty 10

## 2018-09-15 MED ORDER — ARTIFICIAL TEARS OPHTHALMIC OINT
TOPICAL_OINTMENT | OPHTHALMIC | Status: DC | PRN
  Administered 2018-09-15: 1 via OPHTHALMIC

## 2018-09-15 MED ORDER — TRAMADOL HCL 50 MG PO TABS: 50.0000 mg | ORAL_TABLET | ORAL | Status: DC | PRN

## 2018-09-15 MED ORDER — SODIUM CHLORIDE 0.9 % IV SOLN
INTRAVENOUS | Status: AC
  Administered 2018-09-15: 13:00:00 via INTRAVENOUS

## 2018-09-15 MED ORDER — MIDAZOLAM HCL 5 MG/5ML IJ SOLN
INTRAMUSCULAR | Status: DC | PRN
  Administered 2018-09-15: 2 mg via INTRAVENOUS
  Administered 2018-09-15: 1 mg via INTRAVENOUS
  Administered 2018-09-15: 3 mg via INTRAVENOUS
  Administered 2018-09-15 (×2): 1 mg via INTRAVENOUS
  Administered 2018-09-15: 2 mg via INTRAVENOUS

## 2018-09-15 MED ORDER — NITROGLYCERIN IN D5W 200-5 MCG/ML-% IV SOLN: 0.0000 ug/min | INTRAVENOUS | Status: DC

## 2018-09-15 MED ORDER — SODIUM CHLORIDE 0.9% FLUSH
3.0000 mL | Freq: Two times a day (BID) | INTRAVENOUS | Status: DC
  Administered 2018-09-16 – 2018-09-18 (×5): 3 mL via INTRAVENOUS

## 2018-09-15 MED ORDER — ALBUMIN HUMAN 5 % IV SOLN: 250.0000 mL | INTRAVENOUS | Status: AC | PRN

## 2018-09-15 MED ORDER — ONDANSETRON HCL 4 MG/2ML IJ SOLN
INTRAMUSCULAR | Status: DC | PRN
  Administered 2018-09-15: 4 mg via INTRAVENOUS

## 2018-09-15 MED ORDER — ONDANSETRON HCL 4 MG/2ML IJ SOLN
INTRAMUSCULAR | Status: AC
  Filled 2018-09-15: qty 2

## 2018-09-15 MED ORDER — SODIUM CHLORIDE 0.45 % IV SOLN: INTRAVENOUS | Status: DC | PRN

## 2018-09-15 MED ORDER — PROTAMINE SULFATE 10 MG/ML IV SOLN
INTRAVENOUS | Status: AC
  Filled 2018-09-15: qty 10

## 2018-09-15 MED ORDER — LACTATED RINGERS IV SOLN: INTRAVENOUS | Status: DC

## 2018-09-15 MED ORDER — MIDAZOLAM HCL (PF) 10 MG/2ML IJ SOLN
INTRAMUSCULAR | Status: AC
  Filled 2018-09-15: qty 2

## 2018-09-15 MED ORDER — SODIUM CHLORIDE (PF) 0.9 % IJ SOLN
OROMUCOSAL | Status: DC | PRN
  Administered 2018-09-15 (×3): 4 mL via TOPICAL

## 2018-09-15 MED ORDER — DOCUSATE SODIUM 100 MG PO CAPS
200.0000 mg | ORAL_CAPSULE | Freq: Every day | ORAL | Status: DC
  Administered 2018-09-16 – 2018-09-18 (×3): 200 mg via ORAL
  Filled 2018-09-15 (×3): qty 2

## 2018-09-15 MED ORDER — PROPOFOL 10 MG/ML IV BOLUS
INTRAVENOUS | Status: DC | PRN
  Administered 2018-09-15: 50 mg via INTRAVENOUS
  Administered 2018-09-15: 100 mg via INTRAVENOUS

## 2018-09-15 MED ORDER — PHENYLEPHRINE HCL-NACL 20-0.9 MG/250ML-% IV SOLN: 0.0000 ug/min | INTRAVENOUS | Status: DC

## 2018-09-15 MED ORDER — CHLORHEXIDINE GLUCONATE 0.12 % MT SOLN
15.0000 mL | OROMUCOSAL | Status: AC
  Administered 2018-09-15: 15 mL via OROMUCOSAL

## 2018-09-15 MED ORDER — MORPHINE SULFATE (PF) 2 MG/ML IV SOLN
1.0000 mg | INTRAVENOUS | Status: DC | PRN
  Administered 2018-09-15 (×2): 4 mg via INTRAVENOUS
  Administered 2018-09-15: 2 mg via INTRAVENOUS
  Administered 2018-09-15: 4 mg via INTRAVENOUS
  Administered 2018-09-16 – 2018-09-17 (×2): 2 mg via INTRAVENOUS
  Filled 2018-09-15: qty 2
  Filled 2018-09-15 (×2): qty 1
  Filled 2018-09-15: qty 2
  Filled 2018-09-15: qty 1
  Filled 2018-09-15: qty 2

## 2018-09-15 MED ORDER — BISACODYL 10 MG RE SUPP: 10.0000 mg | Freq: Every day | RECTAL | Status: DC

## 2018-09-15 MED ORDER — MIDAZOLAM HCL 2 MG/2ML IJ SOLN: 2.0000 mg | INTRAMUSCULAR | Status: DC | PRN

## 2018-09-15 MED ORDER — SODIUM CHLORIDE 0.9 % IR SOLN
Status: DC | PRN
  Administered 2018-09-15: 5000 mL

## 2018-09-15 MED ORDER — HYDRALAZINE HCL 20 MG/ML IJ SOLN
INTRAMUSCULAR | Status: DC | PRN
  Administered 2018-09-15: 4 mg via INTRAVENOUS
  Administered 2018-09-15: 2 mg via INTRAVENOUS

## 2018-09-15 MED ORDER — ALBUMIN HUMAN 5 % IV SOLN
INTRAVENOUS | Status: DC | PRN
  Administered 2018-09-15: 12:00:00 via INTRAVENOUS

## 2018-09-15 SURGICAL SUPPLY — 99 items
ADAPTER CARDIO PERF ANTE/RETRO (ADAPTER) ×3 IMPLANT
BLADE STERNUM SYSTEM 6 (BLADE) ×3 IMPLANT
BLADE SURG 11 STRL SS (BLADE) ×6 IMPLANT
BLADE SURG 15 STRL LF DISP TIS (BLADE) ×2 IMPLANT
BLADE SURG 15 STRL SS (BLADE) ×1
CANISTER SUCT 3000ML PPV (MISCELLANEOUS) ×3 IMPLANT
CANNULA EZ GLIDE AORTIC 21FR (CANNULA) ×3 IMPLANT
CANNULA GUNDRY RCSP 15FR (MISCELLANEOUS) ×3 IMPLANT
CANNULA MC2 2 STG 36/46 NON-V (CANNULA) ×2 IMPLANT
CANNULA SOFTFLOW AORTIC 7M21FR (CANNULA) ×3 IMPLANT
CANNULA VENOUS 2 STG 34/46 (CANNULA) ×1
CATH CPB KIT OWEN (MISCELLANEOUS) IMPLANT
CATH HEART VENT LEFT (CATHETERS) ×2 IMPLANT
CATH THORACIC 36FR RT ANG (CATHETERS) IMPLANT
CONN ST 1/4X3/8  BEN (MISCELLANEOUS) ×1
CONN ST 1/4X3/8 BEN (MISCELLANEOUS) ×2 IMPLANT
COVER SURGICAL LIGHT HANDLE (MISCELLANEOUS) IMPLANT
COVER WAND RF STERILE (DRAPES) IMPLANT
CRADLE DONUT ADULT HEAD (MISCELLANEOUS) ×3 IMPLANT
DRAIN CHANNEL 32F RND 10.7 FF (WOUND CARE) ×6 IMPLANT
DRAPE BILATERAL SPLIT (DRAPES) IMPLANT
DRAPE CARDIOVASCULAR INCISE (DRAPES)
DRAPE CV SPLIT W-CLR ANES SCRN (DRAPES) IMPLANT
DRAPE INCISE IOBAN 66X45 STRL (DRAPES) ×6 IMPLANT
DRAPE SLUSH/WARMER DISC (DRAPES) IMPLANT
DRAPE SRG 135X102X78XABS (DRAPES) IMPLANT
DRSG AQUACEL AG ADV 3.5X14 (GAUZE/BANDAGES/DRESSINGS) ×3 IMPLANT
DRSG COVADERM 4X14 (GAUZE/BANDAGES/DRESSINGS) IMPLANT
ELECT REM PT RETURN 9FT ADLT (ELECTROSURGICAL) ×6
ELECTRODE REM PT RTRN 9FT ADLT (ELECTROSURGICAL) ×4 IMPLANT
FELT TEFLON 1X6 (MISCELLANEOUS) ×3 IMPLANT
GAUZE SPONGE 4X4 12PLY STRL (GAUZE/BANDAGES/DRESSINGS) ×3 IMPLANT
GLOVE BIO SURGEON STRL SZ 6 (GLOVE) ×9 IMPLANT
GLOVE BIO SURGEON STRL SZ 6.5 (GLOVE) ×9 IMPLANT
GLOVE BIO SURGEON STRL SZ7 (GLOVE) IMPLANT
GLOVE BIO SURGEON STRL SZ7.5 (GLOVE) IMPLANT
GLOVE BIOGEL PI IND STRL 7.0 (GLOVE) ×4 IMPLANT
GLOVE BIOGEL PI IND STRL 7.5 (GLOVE) ×2 IMPLANT
GLOVE BIOGEL PI INDICATOR 7.0 (GLOVE) ×2
GLOVE BIOGEL PI INDICATOR 7.5 (GLOVE) ×1
GLOVE INDICATOR 7.5 STRL GRN (GLOVE) ×6 IMPLANT
GLOVE ORTHO TXT STRL SZ7.5 (GLOVE) ×9 IMPLANT
GOWN STRL REUS W/ TWL LRG LVL3 (GOWN DISPOSABLE) ×20 IMPLANT
GOWN STRL REUS W/TWL LRG LVL3 (GOWN DISPOSABLE) ×10
GOWN STRL REUS W/TWL XL LVL4 (GOWN DISPOSABLE) ×3 IMPLANT
HEMOSTAT POWDER SURGIFOAM 1G (HEMOSTASIS) ×9 IMPLANT
INSERT FOGARTY XLG (MISCELLANEOUS) ×3 IMPLANT
KIT BASIN OR (CUSTOM PROCEDURE TRAY) ×3 IMPLANT
KIT SUCTION CATH 14FR (SUCTIONS) ×9 IMPLANT
KIT TURNOVER KIT B (KITS) ×3 IMPLANT
LEAD PACING MYOCARDI (MISCELLANEOUS) ×3 IMPLANT
LINE VENT (MISCELLANEOUS) ×3 IMPLANT
NS IRRIG 1000ML POUR BTL (IV SOLUTION) ×15 IMPLANT
PACK E OPEN HEART (SUTURE) ×3 IMPLANT
PACK OPEN HEART (CUSTOM PROCEDURE TRAY) ×3 IMPLANT
PAD ARMBOARD 7.5X6 YLW CONV (MISCELLANEOUS) ×6 IMPLANT
PLEDGET 1/4X1/8X1/16 (MISCELLANEOUS) ×2 IMPLANT
PLEDGETS 1/4X1/8X1/16 (MISCELLANEOUS) ×3
RING ANLPLS HAART 300X21 (Ring) ×2 IMPLANT
RING HAART ANNULOPLASTY 300-21 (Ring) ×1 IMPLANT
SET CARDIOPLEGIA MPS 5001102 (MISCELLANEOUS) ×3 IMPLANT
SET IRRIG TUBING LAPAROSCOPIC (IRRIGATION / IRRIGATOR) ×3 IMPLANT
SOLUTION ANTI FOG 6CC (MISCELLANEOUS) ×3 IMPLANT
SUT BONE WAX W31G (SUTURE) ×3 IMPLANT
SUT ETHIBON 2 0 V 52N 30 (SUTURE) ×3 IMPLANT
SUT ETHIBON EXCEL 2-0 V-5 (SUTURE) IMPLANT
SUT ETHIBOND 2 0 SH (SUTURE)
SUT ETHIBOND 2 0 SH 36X2 (SUTURE) IMPLANT
SUT ETHIBOND 2 0 V4 (SUTURE) IMPLANT
SUT ETHIBOND 2 0V4 GREEN (SUTURE) IMPLANT
SUT ETHIBOND 4 0 RB 1 (SUTURE) IMPLANT
SUT ETHIBOND V-5 VALVE (SUTURE) IMPLANT
SUT ETHIBOND X763 2 0 SH 1 (SUTURE) IMPLANT
SUT MNCRL AB 3-0 PS2 18 (SUTURE) ×6 IMPLANT
SUT PDS AB 1 CTX 36 (SUTURE) ×6 IMPLANT
SUT PROLENE 3 0 SH DA (SUTURE) ×3 IMPLANT
SUT PROLENE 3 0 SH1 36 (SUTURE) ×3 IMPLANT
SUT PROLENE 4 0 RB 1 (SUTURE) ×24
SUT PROLENE 4 0 SH DA (SUTURE) ×3 IMPLANT
SUT PROLENE 4-0 RB1 .5 CRCL 36 (SUTURE) ×48 IMPLANT
SUT PROLENE 5 0 C 1 36 (SUTURE) IMPLANT
SUT PROLENE 6 0 C 1 30 (SUTURE) IMPLANT
SUT PROLENE BLUE 7 0 (SUTURE) ×3 IMPLANT
SUT SILK  1 MH (SUTURE) ×2
SUT SILK 1 MH (SUTURE) ×4 IMPLANT
SUT SILK 2 0 SH CR/8 (SUTURE) IMPLANT
SUT SILK 3 0 SH CR/8 (SUTURE) IMPLANT
SUT STEEL 6MS V (SUTURE) IMPLANT
SUT VIC AB 2-0 CTX 27 (SUTURE) IMPLANT
SYSTEM SAHARA CHEST DRAIN ATS (WOUND CARE) ×3 IMPLANT
TAPE CLOTH SOFT 2X10 (GAUZE/BANDAGES/DRESSINGS) ×3 IMPLANT
TAPE CLOTH SURG 4X10 WHT LF (GAUZE/BANDAGES/DRESSINGS) ×3 IMPLANT
TOWEL GREEN STERILE (TOWEL DISPOSABLE) ×3 IMPLANT
TOWEL GREEN STERILE FF (TOWEL DISPOSABLE) ×3 IMPLANT
TRAY FOLEY SLVR 16FR TEMP STAT (SET/KITS/TRAYS/PACK) ×3 IMPLANT
TUBE SUCT INTRACARD DLP 20F (MISCELLANEOUS) ×3 IMPLANT
UNDERPAD 30X30 (UNDERPADS AND DIAPERS) ×3 IMPLANT
VENT LEFT HEART 12002 (CATHETERS) ×3
WATER STERILE IRR 1000ML POUR (IV SOLUTION) ×6 IMPLANT

## 2018-09-15 NOTE — Anesthesia Procedure Notes (Signed)
Central Venous Catheter Insertion Performed by: Roberts Gaudy, MD, anesthesiologist Start/End6/19/2020 6:50 AM, 09/15/2018 7:00 AM Patient location: Pre-op. Preanesthetic checklist: patient identified, IV checked, site marked, risks and benefits discussed, surgical consent, monitors and equipment checked, pre-op evaluation, timeout performed and anesthesia consent Position: supine Hand hygiene performed  and maximum sterile barriers used  PA cath was placed.Swan type:thermodilution Procedure performed without using ultrasound guided technique. Attempts: 1 Following insertion, line sutured, dressing applied and Biopatch. Post procedure assessment: blood return through all ports and free fluid flow  Patient tolerated the procedure well with no immediate complications.

## 2018-09-15 NOTE — Anesthesia Procedure Notes (Signed)
Arterial Line Insertion Start/End6/19/2020 7:00 AM, 09/15/2018 7:16 AM Performed by: Elayne Snare, CRNA, CRNA  Patient location: Pre-op. Preanesthetic checklist: patient identified, IV checked, site marked, risks and benefits discussed, surgical consent, monitors and equipment checked, pre-op evaluation and anesthesia consent Lidocaine 1% used for infiltration and patient sedated Right, radial was placed Catheter size: 20 G Hand hygiene performed  and maximum sterile barriers used  Allen's test indicative of satisfactory collateral circulation Attempts: 3 Procedure performed using ultrasound guided technique. Ultrasound Notes:anatomy identified, needle tip was noted to be adjacent to the nerve/plexus identified and no ultrasound evidence of intravascular and/or intraneural injection Following insertion, dressing applied and Biopatch. Post procedure assessment: normal  Patient tolerated the procedure well with no immediate complications.

## 2018-09-15 NOTE — Brief Op Note (Addendum)
09/15/2018  11:11 AM  PATIENT:  Isaac Patel  53 y.o. male  PRE-OPERATIVE DIAGNOSIS:  AI  POST-OPERATIVE DIAGNOSIS:  AI  PROCEDURE:   AORTIC VALVE REPAIR TRANSESOPHAGEAL ECHOCARDIOGRAM   SURGEON:  Surgeon(s) and Role:    * Rexene Alberts, MD - Primary    * Bartle, Fernande Boyden, MD - Assisting  PHYSICIAN ASSISTANT: Roddenberry  ANESTHESIA:   general  EBL:  600 mL  BLOOD ADMINISTERED:none  DRAINS: Mediastinal drains   LOCAL MEDICATIONS USED:  NONE  SPECIMEN:  No Specimen  DISPOSITION OF SPECIMEN:  N/A  COUNTS:  YES  DICTATION: .Dragon Dictation  PLAN OF CARE: Admit to inpatient   PATIENT DISPOSITION:  ICU - intubated and hemodynamically stable.   Delay start of Pharmacological VTE agent (>24hrs) due to surgical blood loss or risk of bleeding: yes

## 2018-09-15 NOTE — Anesthesia Procedure Notes (Signed)
Procedure Name: Intubation Date/Time: 09/15/2018 7:45 AM Performed by: Harden Mo, CRNA Pre-anesthesia Checklist: Patient identified, Emergency Drugs available, Suction available and Patient being monitored Patient Re-evaluated:Patient Re-evaluated prior to induction Oxygen Delivery Method: Circle System Utilized Preoxygenation: Pre-oxygenation with 100% oxygen Induction Type: IV induction Ventilation: Mask ventilation without difficulty and Oral airway inserted - appropriate to patient size Laryngoscope Size: Sabra Heck and 2 Grade View: Grade I Tube type: Oral Tube size: 8.0 mm Number of attempts: 1 Airway Equipment and Method: Stylet and Oral airway Placement Confirmation: ETT inserted through vocal cords under direct vision,  positive ETCO2 and breath sounds checked- equal and bilateral Secured at: 22 cm Tube secured with: Tape Dental Injury: Teeth and Oropharynx as per pre-operative assessment

## 2018-09-15 NOTE — Interval H&P Note (Signed)
History and Physical Interval Note:  09/15/2018 5:28 AM  Isaac Patel  has presented today for surgery, with the diagnosis of AI.  The various methods of treatment have been discussed with the patient and family. After consideration of risks, benefits and other options for treatment, the patient has consented to  Procedure(s): AORTIC VALVE REPAIR OR REPLACEMENT (N/A) TRANSESOPHAGEAL ECHOCARDIOGRAM (TEE) (N/A) as a surgical intervention.  The patient's history has been reviewed, patient examined, no change in status, stable for surgery.  I have reviewed the patient's chart and labs.  Questions were answered to the patient's satisfaction.     Rexene Alberts

## 2018-09-15 NOTE — Anesthesia Procedure Notes (Addendum)
Central Venous Catheter Insertion Performed by: Roberts Gaudy, MD, anesthesiologist Start/End6/19/2020 6:50 AM, 09/15/2018 7:00 AM Patient location: Pre-op. Preanesthetic checklist: patient identified, IV checked, site marked, risks and benefits discussed, surgical consent, monitors and equipment checked, pre-op evaluation, timeout performed and anesthesia consent Lidocaine 1% used for infiltration and patient sedated Hand hygiene performed  and maximum sterile barriers used  Catheter size: 9 Fr Sheath introducer Procedure performed using ultrasound guided technique. Ultrasound Notes:anatomy identified, needle tip was noted to be adjacent to the nerve/plexus identified, no ultrasound evidence of intravascular and/or intraneural injection and image(s) printed for medical record Attempts: 1 Following insertion, line sutured and dressing applied. Post procedure assessment: blood return through all ports, free fluid flow and no air  Patient tolerated the procedure well with no immediate complications.

## 2018-09-15 NOTE — Anesthesia Preprocedure Evaluation (Addendum)
Anesthesia Evaluation  Patient identified by MRN, date of birth, ID band Patient awake    Reviewed: Allergy & Precautions, NPO status , Patient's Chart, lab work & pertinent test results  Airway Mallampati: II  TM Distance: >3 FB Neck ROM: Full    Dental  (+) Teeth Intact, Dental Advisory Given, Missing   Pulmonary Current Smoker,    breath sounds clear to auscultation       Cardiovascular hypertension,  Rhythm:Regular Rate:Normal + Diastolic murmurs    Neuro/Psych    GI/Hepatic   Endo/Other    Renal/GU      Musculoskeletal   Abdominal   Peds  Hematology   Anesthesia Other Findings   Reproductive/Obstetrics                            Anesthesia Physical Anesthesia Plan  ASA: III  Anesthesia Plan: General   Post-op Pain Management:    Induction: Intravenous  PONV Risk Score and Plan: Ondansetron and Dexamethasone  Airway Management Planned: Oral ETT  Additional Equipment: Arterial line, CVP, PA Cath, 3D TEE and Ultrasound Guidance Line Placement  Intra-op Plan:   Post-operative Plan: Post-operative intubation/ventilation  Informed Consent: I have reviewed the patients History and Physical, chart, labs and discussed the procedure including the risks, benefits and alternatives for the proposed anesthesia with the patient or authorized representative who has indicated his/her understanding and acceptance.     Dental advisory given  Plan Discussed with: CRNA and Anesthesiologist  Anesthesia Plan Comments:         Anesthesia Quick Evaluation

## 2018-09-15 NOTE — Op Note (Signed)
CARDIOTHORACIC SURGERY OPERATIVE NOTE  Date of Procedure:  09/15/2018  Preoperative Diagnosis: Severe Aortic Insufficiency  Postoperative Diagnosis: Same   Procedure:    Aortic Valve Repair   Complex valvuloplasty including plication of prolapsing right coronary cusp             Biostable HAART aortic ring annuloplasty (size 21 mm, ref # [id number], lot # [id number])    Surgeon: Valentina Gu. 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  Patient's cardiac history dates back to 2016 when he presented with an acute non-ST segment elevation myocardial infarction.  He was found to have single-vessel coronary artery disease including 80% stenosis of the proximal left anterior descending coronary artery.  He was treated with PCI using drug-eluting stent.  He has done well since then and remain stable from a cardiac standpoint until recently.  He has never previously been told that he had a heart murmur and he has no reported history of rheumatic fever or rheumatic heart disease.  Several months ago the patient states that he developed relatively sudden onset of vague symptoms in his chest described as a vague tightness and a tendency to get short of breath and tired.  Symptoms ultimately prompted him to present to the  emergency department in March with vague discomfort across his chest.  Cardiac enzymes were negative.  He was noted to have a murmur on physical exam and transthoracic echocardiogram revealed normal left ventricular systolic function and at least moderate or severe aortic insufficiency.  There was moderate left ventricular chamber enlargement.  Transesophageal echocardiogram confirmed the presence of severe aortic insufficiency.  The aortic valve was notably trileaflet and without any signs of rheumatic disease.  He was seen in follow-up by cardiology and recommendations were made for the patient to undergo diagnostic cardiac catheterization.  Initially these diagnostic tests were postponed because of the ongoing COVID-19 pandemic.  Over the next several weeks the patient developed worsening symptoms of fatigue, weakness, exertional shortness of breath, and orthopnea.  He ultimately underwent catheterization on Aug 11, 2018 which revealed continued patency of the stents in the proximal left anterior descending coronary artery and no other significant coronary artery disease.  Filling pressures were normal.  The patient was referred for surgical consultation and has been evaluated previously by Dr. [name].  The patient was noted to have poor dentition and subsequently underwent dental extraction later this week.  He was referred for second surgical opinion to discuss the feasibility of aortic valve repair as an alternative to surgical valve replacement.  The patient has been seen in consultation and counseled at length regarding the indications, risks and potential benefits of surgery.  All questions have been answered, and the patient provides full informed consent for the operation as described.    DETAILS OF THE OPERATIVE PROCEDURE  Preparation:  The patient is brought to the operating room  on the above mentioned date and central monitoring was established by the anesthesia team including placement of  Swan-Ganz catheter and radial arterial line. The patient is placed in the supine position on the operating table.  Intravenous antibiotics are administered. General endotracheal anesthesia is induced uneventfully. A Foley catheter is placed.  Baseline transesophageal echocardiogram was performed.  Findings were notable for severe aortic insufficiency.  There was isolated prolapse involving the right cusp of the aortic valve.  The jet of regurgitation was eccentric.  The left ventricle was dilated.  There was mild left ventricular systolic dysfunction.  There was trace to mild central mitral regurgitation.  No other abnormalities were noted.  The patient's chest, abdomen, both groins, and both lower extremities are prepared and draped in a sterile manner. A time out procedure is performed.   Surgical Approach:  A median sternotomy incision was performed and the pericardium is opened. The ascending aorta is normal in appearance.    Extracorporeal Cardiopulmonary Bypass and Myocardial Protection:  The ascending aorta and the right atrium are cannulated for cardiopulmonary bypass.  Adequate heparinization is verified.   A retrograde cardioplegia cannula is placed through the right atrium into the coronary sinus.  The operative field was continuously flooded with carbon dioxide gas.  The entire pre-bypass portion of the operation was notable for stable hemodynamics.  Cardiopulmonary bypass was begun and the surface of the heart is inspected.  A cardioplegia cannula is placed in the ascending aorta.  A temperature probe was placed in the interventricular septum.  The patient is cooled to 32C systemic temperature.  The aortic cross clamp is applied and cardioplegia is delivered initially in an antegrade fashion through the aortic root using modified [place] cold blood cardioplegia ([place] blood cardioplegia protocol).  As soon as the patient developed ventricular fibrillation cardioplegia was  administered retrograde through the coronary sinus catheter and the majority of the resting dose was given retrograde.  The initial cardioplegic arrest is rapid with early diastolic arrest.  Myocardial protection was felt to be excellent.   Aortic Valve Repair:  The aorta was dissected away from the pulmonary artery and the proximal aortic root exposed.  A transverse aortotomy incision was made approximately 1 cm above the origin of the right coronary artery. The aortic valve was inspected. The aortic valve was trileaflet.  The right coronary leaflet was severely prolapsing.  There was an obvious fracture line in the right coronary leaflet as well as evidence of incomplete fenestrations close to the commissure between the right and the noncoronary leaflet with diminutive tissue in this region contributing to the right coronary leaflet prolapse.  The left coronary leaflet and the noncoronary leaflets both appeared essentially normal.  There was no significant thickening or leaflet fibrosis.  Traction sutures are placed just above each of the 3 commissures and spread laterally to suspend the valve symmetrically. The valve is again carefully inspected. The aortic annulus is sized with a Hegar dilator and found to be approximately 24 mm, consistent with the preoperative echo.  Each of the valve leaflets are sized using specially developed sizers for the Biostable annuloplasty ring to measure the length of the free margin of each leaflet.   A decision is made to proceed with a 21 mm annuloplasty ring.  Biostable HAART 300 aortic ring annuloplasty (size 21 mm, ref # [id number], lot # [id number]) was implanted per manufacturer recommendations. Initially pledgeted Cabrol type pledgeted 4-0 Prolene sutures are placed beneath each of the commissures and through  the posts of the ring. The the ring is subsequently lowered through the valve into the sub-annular space. A total of 7 additional sutures are then placed at  the base of each of the 3 sinuses of Valsalva to secure the ring beneath the valve, including 2 each bisecting the base of the left coronary leaflet and the noncoronary leaflet with 3 beneath the right coronary leaflet. All sutures are individually tied and lateral fixation is utilized to pull each of the sutures away from the valve leaflets.  The valve is again carefully annualized and appears appropriately sized with normal coaptation other than severe prolapse involving the right coronary leaflet.  The area of prolapse is noticeably asymmetric and located in the portion of the right coronary leaflet adjacent to the noncoronary leaflet. The prolapse is corrected using a pair of 7-0 Prolene plication sutures on the non-coronary side of the [place] of [place].    In addition, a single pledgeted horizontal mattress commissural sutures placed at the commissure between the right and the noncoronary leaflet to address the diminutive tissue along the free margin of the right coronary leaflet close to the commissure.  At this juncture the valve is carefully tested and appears competent with symmetrical coaptation involving all 3 leaflets and no residual prolapse.  Rewarming was begun.  The aortotomy was closed  using a 2-layer closure of running 4-0 Prolene suture.  One final dose of warm retrograde "reanimation" cardioplegia was administered retrograde through the coronary sinus catheter while all air was evacuated through the aortic root.  The aortic cross clamp was removed after a cross clamp time of 95 minutes.  The patient returned to sinus rhythm spontaneously. Prior to removal of any of the cannulas, the patient was weaned from cardiopulmonary bypass and valve function carefully examined using transesophageal echocardiogram.  The valve appears to be perfectly competent with trivial residual aortic insufficiency.   Procedure Completion:  Epicardial pacing wires are fixed to the right ventricular  outflow tract and to the right atrial appendage. The patient is rewarmed to 37C temperature. The aortic and left ventricular vents are removed.  The patient is weaned and disconnected from cardiopulmonary bypass.  The patient's rhythm at separation from bypass was sinus.  The patient was weaned from cardiopulmonary bypass without any inotropic support. Total cardiopulmonary bypass time for the operation was 113 minutes.  Followup transesophageal echocardiogram performed after separation from bypass revealed a well-seated aortic annuloplasty ring with a valve that was functioning normally and with trivial residual aortic insufficiency.  Mean gradient across the aortic valve was estimated 5 mmHg.  There was greater than 6 mm of coaptation of all 3 leaflets of the aortic valve.  Deep transgastric views were verified to make sure there is no sign of eccentric jets of residual regurgitation.  Left ventricular function was unchanged from preoperatively.  The aortic and venous cannula were removed uneventfully. Protamine was administered to reverse the anticoagulation. The mediastinum and pleural space were inspected for hemostasis and irrigated with saline solution. The mediastinum and the right pleural space were drained using 3 chest tubes placed through separate stab incisions inferiorly.  The soft tissues anterior to the aorta were reapproximated loosely. The sternum is closed with double strength sternal wire. The soft tissues anterior to the sternum were closed in multiple layers and the skin is closed with a running subcuticular skin closure.  The post-bypass portion of the operation was notable for stable rhythm and hemodynamics.  No blood products were administered during  the operation.   Disposition:  The patient tolerated the procedure well and is transported to the surgical intensive care in stable condition. There are no intraoperative complications. All sponge instrument and needle counts are  verified correct at completion of the operation.    Valentina Gu. Roxy Manns MD 09/15/2018 12:02 PM

## 2018-09-15 NOTE — Transfer of Care (Signed)
Immediate Anesthesia Transfer of Care Note  Patient: Isaac Patel  Procedure(s) Performed: AORTIC VALVE REPAIR USING HAART 300 AORTIC ANNULOPLASTY DEVICE SIZE 21MM (N/A Chest) TRANSESOPHAGEAL ECHOCARDIOGRAM (TEE) (N/A )  Patient Location: SICU  Anesthesia Type:General  Level of Consciousness: sedated, unresponsive and Patient remains intubated per anesthesia plan  Airway & Oxygen Therapy: Patient remains intubated per anesthesia plan and Patient placed on Ventilator (see vital sign flow sheet for setting)  Post-op Assessment: Report given to RN and Post -op Vital signs reviewed and stable  Post vital signs: Reviewed and stable  Last Vitals:  Vitals Value Taken Time  BP    Temp    Pulse    Resp    SpO2      Last Pain:  Vitals:   09/15/18 0604  TempSrc:   PainSc: 0-No pain         Complications: No apparent anesthesia complications

## 2018-09-15 NOTE — Progress Notes (Signed)
Rapid weaning protocol 

## 2018-09-15 NOTE — Progress Notes (Signed)
[facility] BRIEF SICU PROGRESS NOTE  Day of Surgery  S/P Procedure(s) (LRB): AORTIC VALVE REPAIR USING HAART 300 AORTIC ANNULOPLASTY DEVICE SIZE 21MM (N/A) TRANSESOPHAGEAL ECHOCARDIOGRAM (TEE) (N/A)   Extubated uneventfully NSR w/ stable hemodynamics, no drips Breathing comfortably w/ O2 sats 94% on nasal cannula Minimal chest tube output Excellent UOP Labs okay  Plan: Continue routine early postop  Rexene Alberts, MD 09/15/2018 4:18 PM

## 2018-09-15 NOTE — Anesthesia Postprocedure Evaluation (Signed)
Anesthesia Post Note  Patient: Isaac Patel  Procedure(s) Performed: AORTIC VALVE REPAIR USING HAART 300 AORTIC ANNULOPLASTY DEVICE SIZE 21MM (N/A Chest) TRANSESOPHAGEAL ECHOCARDIOGRAM (TEE) (N/A )     Patient location during evaluation: SICU Anesthesia Type: General Level of consciousness: sedated and patient remains intubated per anesthesia plan Pain management: pain level controlled Vital Signs Assessment: post-procedure vital signs reviewed and stable Respiratory status: patient remains intubated per anesthesia plan and patient on ventilator - see flowsheet for VS Cardiovascular status: stable Postop Assessment: no apparent nausea or vomiting Anesthetic complications: no    Last Vitals:  Vitals:   09/15/18 1448 09/15/18 1601  BP:    Pulse:  (!) 109  Resp:  (!) 26  Temp:  36.7 C  SpO2: 94% 94%    Last Pain:  Vitals:   09/15/18 0604  TempSrc:   PainSc: 0-No pain                 Glynis Hunsucker COKER

## 2018-09-15 NOTE — Procedures (Signed)
Extubation Procedure Note  Patient Details:   Name: Isaac Patel DOB: 12/21/1965 MRN: [id number]   Airway Documentation:    Vent end date: 09/15/18 Vent end time: 1512   Evaluation  O2 sats: stable throughout Complications: No apparent complications Patient did tolerate procedure well. Bilateral Breath Sounds: Clear   Yes   Patient extubated per protocol to 4L [place] with no apparent complications. Positive cuff leak was noted prior to extubation. Patient achieved NIF of -30 and VC of 1.5L with good effort. Patient is able to speak and follow commands. Vitals are stable. RT will continue to monitor.   Meganne Rita Clyda Greener 09/15/2018, 3:21 PM

## 2018-09-15 NOTE — Plan of Care (Signed)
Received report, patient remains stable, offers complaints of pain but better than previously reported.  Monitoring as per protocol.

## 2018-09-16 ENCOUNTER — Inpatient Hospital Stay (HOSPITAL_COMMUNITY): Payer: 59

## 2018-09-16 LAB — BASIC METABOLIC PANEL
Anion gap: 8 (ref 5–15)
Anion gap: 10 (ref 5–15)
BUN: 14 mg/dL (ref 6–20)
BUN: 14 mg/dL (ref 6–20)
CO2: 20 mmol/L — ABNORMAL LOW (ref 22–32)
CO2: 23 mmol/L (ref 22–32)
Calcium: 7.9 mg/dL — ABNORMAL LOW (ref 8.9–10.3)
Calcium: 8.2 mg/dL — ABNORMAL LOW (ref 8.9–10.3)
Chloride: 105 mmol/L (ref 98–111)
Chloride: 102 mmol/L (ref 98–111)
Creatinine, Ser: 0.85 mg/dL (ref 0.61–1.24)
Creatinine, Ser: 0.98 mg/dL (ref 0.61–1.24)
GFR calc Af Amer: >60 mL/min (ref >60)
GFR calc Af Amer: >60 mL/min (ref >60)
GFR calc non Af Amer: >60 mL/min (ref >60)
GFR calc non Af Amer: >60 mL/min (ref >60)
Glucose, Bld: 139 mg/dL — ABNORMAL HIGH (ref 70–99)
Glucose, Bld: 163 mg/dL — ABNORMAL HIGH (ref 70–99)
Potassium: 4.6 mmol/L (ref 3.5–5.1)
Potassium: 4.4 mmol/L (ref 3.5–5.1)
Sodium: 133 mmol/L — ABNORMAL LOW (ref 135–145)
Sodium: 135 mmol/L (ref 135–145)

## 2018-09-16 LAB — GLUCOSE, CAPILLARY
Glucose-Capillary: 115 mg/dL — ABNORMAL HIGH (ref 70–99)
Glucose-Capillary: 117 mg/dL — ABNORMAL HIGH (ref 70–99)
Glucose-Capillary: 141 mg/dL — ABNORMAL HIGH (ref 70–99)
Glucose-Capillary: 146 mg/dL — ABNORMAL HIGH (ref 70–99)
Glucose-Capillary: 156 mg/dL — ABNORMAL HIGH (ref 70–99)
Glucose-Capillary: 75 mg/dL (ref 70–99)
Glucose-Capillary: 87 mg/dL (ref 70–99)

## 2018-09-16 LAB — CBC
HCT: 34.9 % — ABNORMAL LOW (ref 39.0–52.0)
HCT: 37.2 % — ABNORMAL LOW (ref 39.0–52.0)
Hemoglobin: 12.1 g/dL — ABNORMAL LOW (ref 13.0–17.0)
Hemoglobin: 12.7 g/dL — ABNORMAL LOW (ref 13.0–17.0)
MCH: 31.8 pg (ref 26.0–34.0)
MCH: 31.8 pg (ref 26.0–34.0)
MCHC: 34.7 g/dL (ref 30.0–36.0)
MCHC: 34.1 g/dL (ref 30.0–36.0)
MCV: 91.8 fL (ref 80.0–100.0)
MCV: 93 fL (ref 80.0–100.0)
Platelets: 138 10*3/uL — ABNORMAL LOW (ref 150–400)
Platelets: 146 10*3/uL — ABNORMAL LOW (ref 150–400)
RBC: 3.8 MIL/uL — ABNORMAL LOW (ref 4.22–5.81)
RBC: 4 MIL/uL — ABNORMAL LOW (ref 4.22–5.81)
RDW: 12.3 % (ref 11.5–15.5)
RDW: 12.4 % (ref 11.5–15.5)
WBC: 16.6 10*3/uL — ABNORMAL HIGH (ref 4.0–10.5)
WBC: 19.7 10*3/uL — ABNORMAL HIGH (ref 4.0–10.5)
nRBC: 0 % (ref 0.0–0.2)
nRBC: 0 % (ref 0.0–0.2)

## 2018-09-16 LAB — MAGNESIUM
Magnesium: 2.4 mg/dL (ref 1.7–2.4)
Magnesium: 2.5 mg/dL — ABNORMAL HIGH (ref 1.7–2.4)

## 2018-09-16 MED ORDER — ENOXAPARIN SODIUM 30 MG/0.3ML ~~LOC~~ SOLN
30.0000 mg | Freq: Every day | SUBCUTANEOUS | Status: DC
  Administered 2018-09-17: 22:00:00 30 mg via SUBCUTANEOUS
  Filled 2018-09-16: qty 0.3

## 2018-09-16 MED ORDER — METOPROLOL TARTRATE 25 MG PO TABS
25.0000 mg | ORAL_TABLET | Freq: Two times a day (BID) | ORAL | Status: DC
  Administered 2018-09-16 – 2018-09-18 (×4): 25 mg via ORAL
  Filled 2018-09-16 (×4): qty 1

## 2018-09-16 MED ORDER — CHLORHEXIDINE GLUCONATE CLOTH 2 % EX PADS
6.0000 | MEDICATED_PAD | Freq: Every day | CUTANEOUS | Status: DC
  Administered 2018-09-16 – 2018-09-18 (×3): 6 via TOPICAL

## 2018-09-16 MED ORDER — METOPROLOL TARTRATE 12.5 MG HALF TABLET
12.5000 mg | ORAL_TABLET | Freq: Two times a day (BID) | ORAL | Status: DC
  Administered 2018-09-16: 10:00:00 12.5 mg via ORAL
  Filled 2018-09-16: qty 1

## 2018-09-16 MED ORDER — FUROSEMIDE 10 MG/ML IJ SOLN
20.0000 mg | Freq: Two times a day (BID) | INTRAMUSCULAR | Status: AC
  Administered 2018-09-16 – 2018-09-17 (×3): 20 mg via INTRAVENOUS
  Filled 2018-09-16 (×3): qty 2

## 2018-09-16 MED ORDER — INSULIN ASPART 100 UNIT/ML ~~LOC~~ SOLN: 0.0000 [IU] | SUBCUTANEOUS | Status: DC

## 2018-09-16 MED ORDER — INSULIN ASPART 100 UNIT/ML ~~LOC~~ SOLN
0.0000 [IU] | SUBCUTANEOUS | Status: DC
  Administered 2018-09-16: 2 [IU] via SUBCUTANEOUS
  Administered 2018-09-16: 4 [IU] via SUBCUTANEOUS
  Administered 2018-09-17: 08:00:00 2 [IU] via SUBCUTANEOUS

## 2018-09-16 MED ORDER — INSULIN ASPART 100 UNIT/ML ~~LOC~~ SOLN
0.0000 [IU] | SUBCUTANEOUS | Status: DC
  Administered 2018-09-16: 2 [IU] via SUBCUTANEOUS

## 2018-09-16 NOTE — Progress Notes (Signed)
Anesthesiology Follow-up:  Awake and alert, neuro intact, having incisional chest soreness, eating lunch at present.  VS: T- 37.3 BP- 135/95 HR- 105 (SR) RR- 22 O2 Sat 98% on 1L nasal cannula  K- 4.6 BUN/Cr.- 14/0.85 glucose- 139 H/H- 12.1/34.8 Platelets- 138,000  Extubated 3 hours post-op.  53 year old male one day S/P aortic valve repair with Haart 300 ring for severe AI. Doing well so far no apparent complications.  Roberts Gaudy, MD

## 2018-09-16 NOTE — Progress Notes (Signed)
T[facility] BRIEF SICU PROGRESS NOTE  1 Day Post-Op  S/P Procedure(s) (LRB): AORTIC VALVE REPAIR USING HAART 300 AORTIC ANNULOPLASTY DEVICE SIZE 21MM (N/A) TRANSESOPHAGEAL ECHOCARDIOGRAM (TEE) (N/A)   Doing well NSR - sinus tach with stable BP Breathing comfortably Minimal chest tube output UOP adequate  Plan: D/C chest tubes.  Increase metoprolol dose.  Rexene Alberts, MD 09/16/2018 5:39 PM

## 2018-09-16 NOTE — Progress Notes (Addendum)
      [address]       [place] [address]             [phone]        CARDIOTHORACIC SURGERY PROGRESS NOTE   R1 Day Post-Op Procedure(s) (LRB): AORTIC VALVE REPAIR USING HAART 300 AORTIC ANNULOPLASTY DEVICE SIZE 21MM (N/A) TRANSESOPHAGEAL ECHOCARDIOGRAM (TEE) (N/A)  Subjective: Looks good.  Expected soreness in chest.  Hurts to take deep breath.  Otherwise doing well  Objective: Vital signs: BP Readings from Last 1 Encounters:  09/16/18 (!) 111/96   Pulse Readings from Last 1 Encounters:  09/16/18 100   Resp Readings from Last 1 Encounters:  09/16/18 (!) 21   Temp Readings from Last 1 Encounters:  09/16/18 98.2 F (36.8 C)    Hemodynamics: PAP: (15-30)/(6-21) 27/21 CO:  [4.9 L/min-6 L/min] 5.6 L/min CI:  [2.3 L/min/m2-2.8 L/min/m2] 2.6 L/min/m2  Physical Exam:  Rhythm:   sinus  Breath sounds: clear  Heart sounds:  RRR w/out murmur  Incisions:  Dressings clean and dry  Abdomen:  Soft, non-distended, non-tender  Extremities:  Warm, well-perfused  Chest tubes:  low volume thin serosanguinous output, no air leak    Intake/Output from previous day: 06/19 0701 - 06/20 0700 In: 5449 [P.O.:240; I.V.:4135.8; Blood:300; IV Piggyback:773.2] Out: 1761 [Urine:3788; Blood:600; Chest Tube:510] Intake/Output this shift: Total I/O In: 27.7 [I.V.:27.7] Out: 950 [Urine:950]  Lab Results:  CBC: Recent Labs    09/15/18 1830 09/16/18 0248  WBC 18.0* 16.6*  HGB 13.1 12.1*  HCT 38.0* 34.9*  PLT 181 138*    BMET:  Recent Labs    09/15/18 1826 09/15/18 1830 09/16/18 0248  NA 140  --  133*  K 3.9  --  4.6  CL 108  --  105  CO2  --   --  20*  GLUCOSE 140*  --  139*  BUN 13  --  14  CREATININE 0.70 1.02 0.85  CALCIUM  --   --  7.9*     PT/INR:   Recent Labs    09/15/18 1219  LABPROT 17.3*  INR 1.4*    CBG (last 3)  Recent Labs    09/16/18 0113 09/16/18 0502 09/16/18 0724  GLUCAP 75 141* 146*    ABG    Component Value  Date/Time   PHART 7.406 09/15/2018 1619   PCO2ART 35.4 09/15/2018 1619   PO2ART 80.0 (L) 09/15/2018 1619   HCO3 22.3 09/15/2018 1619   TCO2 20 (L) 09/15/2018 1826   ACIDBASEDEF 2.0 09/15/2018 1619   O2SAT 96.0 09/15/2018 1619    CXR: Looks good.  Minimal bibasilar atelectasis  EKG: NSR w/out ischemic changes or significant AV conduction delay   Assessment/Plan: S/P Procedure(s) (LRB): AORTIC VALVE REPAIR USING HAART 300 AORTIC ANNULOPLASTY DEVICE SIZE 21MM (N/A) TRANSESOPHAGEAL ECHOCARDIOGRAM (TEE) (N/A)  Doing well POD1 Maintaining NSR w/ stable hemodynamics, no drips Breathing comfortably w/ O2 sats 94%, CXR looks good Expected post op acute blood loss anemia, very mild Acute on chronic combined systolic and diastolic CHF with expected post-op volume excess, weight not recorded yet Leukocytosis w/out fever, likely reactive   Mobilize  D/C lines  Start beta blocker, ASA  Gentle diuresis  D/C tubes later today or tomorrow, depending on output   Rexene Alberts, MD 09/16/2018 9:16 AM

## 2018-09-16 NOTE — Progress Notes (Signed)
Assisted tele visit to patient with wife.  Isaac Patel, Philis Nettle, RN

## 2018-09-17 ENCOUNTER — Inpatient Hospital Stay (HOSPITAL_COMMUNITY): Payer: 59

## 2018-09-17 DIAGNOSIS — D62 Acute posthemorrhagic anemia: Secondary | ICD-10-CM | POA: Diagnosis not present

## 2018-09-17 DIAGNOSIS — I110 Hypertensive heart disease with heart failure: Secondary | ICD-10-CM | POA: Diagnosis not present

## 2018-09-17 DIAGNOSIS — G4733 Obstructive sleep apnea (adult) (pediatric): Secondary | ICD-10-CM | POA: Diagnosis not present

## 2018-09-17 DIAGNOSIS — E785 Hyperlipidemia, unspecified: Secondary | ICD-10-CM | POA: Diagnosis not present

## 2018-09-17 DIAGNOSIS — I5043 Acute on chronic combined systolic (congestive) and diastolic (congestive) heart failure: Secondary | ICD-10-CM | POA: Diagnosis not present

## 2018-09-17 DIAGNOSIS — I2510 Atherosclerotic heart disease of native coronary artery without angina pectoris: Secondary | ICD-10-CM | POA: Diagnosis not present

## 2018-09-17 DIAGNOSIS — J9811 Atelectasis: Secondary | ICD-10-CM | POA: Diagnosis not present

## 2018-09-17 DIAGNOSIS — I083 Combined rheumatic disorders of mitral, aortic and tricuspid valves: Secondary | ICD-10-CM | POA: Diagnosis not present

## 2018-09-17 DIAGNOSIS — Z1159 Encounter for screening for other viral diseases: Secondary | ICD-10-CM | POA: Diagnosis not present

## 2018-09-17 LAB — CBC
HCT: 35.4 % — ABNORMAL LOW (ref 39.0–52.0)
Hemoglobin: 12.1 g/dL — ABNORMAL LOW (ref 13.0–17.0)
MCH: 31.8 pg (ref 26.0–34.0)
MCHC: 34.2 g/dL (ref 30.0–36.0)
MCV: 93.2 fL (ref 80.0–100.0)
Platelets: 130 10*3/uL — ABNORMAL LOW (ref 150–400)
RBC: 3.8 MIL/uL — ABNORMAL LOW (ref 4.22–5.81)
RDW: 12.7 % (ref 11.5–15.5)
WBC: 16.1 10*3/uL — ABNORMAL HIGH (ref 4.0–10.5)
nRBC: 0 % (ref 0.0–0.2)

## 2018-09-17 LAB — BASIC METABOLIC PANEL
Anion gap: 7 (ref 5–15)
BUN: 14 mg/dL (ref 6–20)
CO2: 28 mmol/L (ref 22–32)
Calcium: 8.4 mg/dL — ABNORMAL LOW (ref 8.9–10.3)
Chloride: 104 mmol/L (ref 98–111)
Creatinine, Ser: 0.94 mg/dL (ref 0.61–1.24)
GFR calc Af Amer: >60 mL/min (ref >60)
GFR calc non Af Amer: >60 mL/min (ref >60)
Glucose, Bld: 102 mg/dL — ABNORMAL HIGH (ref 70–99)
Potassium: 4.1 mmol/L (ref 3.5–5.1)
Sodium: 139 mmol/L (ref 135–145)

## 2018-09-17 LAB — GLUCOSE, CAPILLARY
Glucose-Capillary: 102 mg/dL — ABNORMAL HIGH (ref 70–99)
Glucose-Capillary: 105 mg/dL — ABNORMAL HIGH (ref 70–99)
Glucose-Capillary: 146 mg/dL — ABNORMAL HIGH (ref 70–99)
Glucose-Capillary: 84 mg/dL (ref 70–99)

## 2018-09-17 MED ORDER — GABAPENTIN 100 MG PO CAPS
200.0000 mg | ORAL_CAPSULE | Freq: Every day | ORAL | Status: DC
  Administered 2018-09-17: 200 mg via ORAL
  Filled 2018-09-17: qty 2

## 2018-09-17 MED ORDER — ATORVASTATIN CALCIUM 80 MG PO TABS
80.0000 mg | ORAL_TABLET | Freq: Every day | ORAL | Status: DC
  Administered 2018-09-17: 80 mg via ORAL
  Filled 2018-09-17: qty 1

## 2018-09-17 MED ORDER — PANTOPRAZOLE SODIUM 40 MG PO TBEC
40.0000 mg | DELAYED_RELEASE_TABLET | Freq: Two times a day (BID) | ORAL | Status: DC
  Administered 2018-09-17 – 2018-09-18 (×3): 40 mg via ORAL
  Filled 2018-09-17 (×2): qty 1

## 2018-09-17 MED ORDER — FUROSEMIDE 40 MG PO TABS
40.0000 mg | ORAL_TABLET | Freq: Every day | ORAL | Status: AC
  Administered 2018-09-17 – 2018-09-18 (×2): 40 mg via ORAL
  Filled 2018-09-17 (×2): qty 1

## 2018-09-17 MED ORDER — MOVING RIGHT ALONG BOOK
Freq: Once | Status: AC
  Administered 2018-09-17: 15:00:00
  Filled 2018-09-17: qty 1

## 2018-09-17 MED ORDER — ASPIRIN EC 325 MG PO TBEC
325.0000 mg | DELAYED_RELEASE_TABLET | Freq: Every day | ORAL | Status: DC
  Administered 2018-09-17 – 2018-09-18 (×2): 325 mg via ORAL
  Filled 2018-09-17: qty 1

## 2018-09-17 MED ORDER — POTASSIUM CHLORIDE CRYS ER 20 MEQ PO TBCR
20.0000 meq | EXTENDED_RELEASE_TABLET | Freq: Every day | ORAL | Status: AC
  Administered 2018-09-17 – 2018-09-18 (×2): 20 meq via ORAL
  Filled 2018-09-17 (×2): qty 1

## 2018-09-17 NOTE — H&P (Signed)
Patients home CPAP set up and at bedside for patients use when he is ready. He states he doesn't need assistance placing it on. Patient sitting on side of bed resting comfortably.

## 2018-09-17 NOTE — Progress Notes (Signed)
      [address]       [place] [address]             [phone]        CARDIOTHORACIC SURGERY PROGRESS NOTE   R2 Days Post-Op Procedure(s) (LRB): AORTIC VALVE REPAIR USING HAART 300 AORTIC ANNULOPLASTY DEVICE SIZE 21MM (N/A) TRANSESOPHAGEAL ECHOCARDIOGRAM (TEE) (N/A)  Subjective: Looks good.  Complains that he couldn't sleep and he's exhausted.  Mild soreness in chest.  No SOB.  Appetite improving  Objective: Vital signs: BP Readings from Last 1 Encounters:  09/17/18 135/77   Pulse Readings from Last 1 Encounters:  09/17/18 (!) 110   Resp Readings from Last 1 Encounters:  09/17/18 (!) 25   Temp Readings from Last 1 Encounters:  09/17/18 98.8 F (37.1 C) (Oral)    Hemodynamics:    Physical Exam:  Rhythm:   sinus  Breath sounds: clear  Heart sounds:  RRR w/out murmur  Incisions:  Dressing dry, intact  Abdomen:  Soft, non-distended, non-tender  Extremities:  Warm, well-perfused    Intake/Output from previous day: 06/20 0701 - 06/21 0700 In: 4499.2 [P.O.:4130; I.V.:169.2; IV Piggyback:200] Out: 5845 [Urine:5775; Chest Tube:70] Intake/Output this shift: Total I/O In: 20 [I.V.:20] Out: 1075 [Urine:1075]  Lab Results:  CBC: Recent Labs    09/16/18 1659 09/17/18 0521  WBC 19.7* 16.1*  HGB 12.7* 12.1*  HCT 37.2* 35.4*  PLT 146* 130*    BMET:  Recent Labs    09/16/18 1659 09/17/18 0521  NA 135 139  K 4.4 4.1  CL 102 104  CO2 23 28  GLUCOSE 163* 102*  BUN 14 14  CREATININE 0.98 0.94  CALCIUM 8.2* 8.4*     PT/INR:   Recent Labs    09/15/18 1219  LABPROT 17.3*  INR 1.4*    CBG (last 3)  Recent Labs    09/16/18 2337 09/17/18 0331 09/17/18 0722  GLUCAP 102* 84 146*    ABG    Component Value Date/Time   PHART 7.406 09/15/2018 1619   PCO2ART 35.4 09/15/2018 1619   PO2ART 80.0 (L) 09/15/2018 1619   HCO3 22.3 09/15/2018 1619   TCO2 20 (L) 09/15/2018 1826   ACIDBASEDEF 2.0 09/15/2018 1619   O2SAT 96.0  09/15/2018 1619    CXR: PORTABLE CHEST 1 VIEW  COMPARISON:  09/16/2018  FINDINGS: Right IJ central venous sheath has tip over the SVC. Sternotomy wires unchanged. Lungs are somewhat hypoinflated with improving bibasilar atelectasis. No significant effusion. No pneumothorax. Cardiomediastinal silhouette and remainder of the exam is unchanged.  IMPRESSION: Hypoinflation with improving bibasilar atelectasis.   Electronically Signed   By: Marin Olp M.D.   On: 09/17/2018 08:39  Assessment/Plan: S/P Procedure(s) (LRB): AORTIC VALVE REPAIR USING HAART 300 AORTIC ANNULOPLASTY DEVICE SIZE 21MM (N/A) TRANSESOPHAGEAL ECHOCARDIOGRAM (TEE) (N/A)   Doing well POD2 Maintaining NSR w/ stable BP Breathing comfortably w/ O2 sats 91-94%, CXR looks good Expected post op atelectasis, mild Expected post op acute blood loss anemia, very mild Acute on chronic combined systolic and diastolic CHF with expected post-op volume excess, weight actually at preop baseline Leukocytosis w/out fever, likely reactive, trending down   Mobilize  Gentle diuresis  ASA, beta blocker  Transfer 4E  Rexene Alberts, MD 09/17/2018 9:45 AM

## 2018-09-18 ENCOUNTER — Inpatient Hospital Stay (HOSPITAL_COMMUNITY): Payer: 59

## 2018-09-18 ENCOUNTER — Encounter (HOSPITAL_COMMUNITY): Payer: Self-pay | Admitting: Thoracic Surgery (Cardiothoracic Vascular Surgery)

## 2018-09-18 LAB — CBC
HCT: 36.2 % — ABNORMAL LOW (ref 39.0–52.0)
Hemoglobin: 12.2 g/dL — ABNORMAL LOW (ref 13.0–17.0)
MCH: 31.2 pg (ref 26.0–34.0)
MCHC: 33.7 g/dL (ref 30.0–36.0)
MCV: 92.6 fL (ref 80.0–100.0)
Platelets: 133 10*3/uL — ABNORMAL LOW (ref 150–400)
RBC: 3.91 MIL/uL — ABNORMAL LOW (ref 4.22–5.81)
RDW: 12.6 % (ref 11.5–15.5)
WBC: 10.5 10*3/uL (ref 4.0–10.5)
nRBC: 0 % (ref 0.0–0.2)

## 2018-09-18 LAB — BASIC METABOLIC PANEL
Anion gap: 8 (ref 5–15)
BUN: 14 mg/dL (ref 6–20)
CO2: 27 mmol/L (ref 22–32)
Calcium: 8.4 mg/dL — ABNORMAL LOW (ref 8.9–10.3)
Chloride: 102 mmol/L (ref 98–111)
Creatinine, Ser: 1.02 mg/dL (ref 0.61–1.24)
GFR calc Af Amer: >60 mL/min (ref >60)
GFR calc non Af Amer: >60 mL/min (ref >60)
Glucose, Bld: 110 mg/dL — ABNORMAL HIGH (ref 70–99)
Potassium: 4.1 mmol/L (ref 3.5–5.1)
Sodium: 137 mmol/L (ref 135–145)

## 2018-09-18 MED ORDER — OXYCODONE-ACETAMINOPHEN 5-325 MG PO TABS: 1.0000 | ORAL_TABLET | ORAL | 0 refills | Status: DC | PRN

## 2018-09-18 MED ORDER — METOPROLOL TARTRATE 25 MG PO TABS: 25.0000 mg | ORAL_TABLET | Freq: Two times a day (BID) | ORAL | 2 refills | Status: DC

## 2018-09-18 NOTE — Progress Notes (Signed)
EPW removed per MD order without difficulty.  Cycling vitals and pt on bedrest for an hour.  Will continue to monitor.

## 2018-09-18 NOTE — Progress Notes (Signed)
3 Days Post-Op Procedure(s) (LRB): AORTIC VALVE REPAIR USING HAART 300 AORTIC ANNULOPLASTY DEVICE SIZE 21MM (N/A) TRANSESOPHAGEAL ECHOCARDIOGRAM (TEE) (N/A) Subjective: Sitting up on side of bed, says he feel well, no complaints.  Objective: Vital signs in last 24 hours: Temp:  [97.9 F (36.6 C)-98.7 F (37.1 C)] 98.4 F (36.9 C) (06/22 0444) Pulse Rate:  [88-137] 101 (06/22 0444) Cardiac Rhythm: Normal sinus rhythm (06/22 0700) Resp:  [14-29] 26 (06/22 0444) BP: (112-136)/(75-96) 121/75 (06/22 0444) SpO2:  [89 %-98 %] 98 % (06/22 0444) Weight:  [92 kg] 92 kg (06/22 0444)    Intake/Output from previous day: 06/21 0701 - 06/22 0700 In: 280 [P.O.:240; I.V.:40] Out: 1850 [Urine:1850] Intake/Output this shift: No intake/output data recorded.  General appearance: no distress Neurologic: intact Heart: regular rate and rhythm, S1, S2 normal, no murmur, click, rub or gallop Lungs: clear to auscultation bilaterally Wound: the sternotomy incision is covered with a clean Aquacel dressing.   Lab Results: Recent Labs    09/17/18 0521 09/18/18 0413  WBC 16.1* 10.5  HGB 12.1* 12.2*  HCT 35.4* 36.2*  PLT 130* 133*   BMET:  Recent Labs    09/17/18 0521 09/18/18 0413  NA 139 137  K 4.1 4.1  CL 104 102  CO2 28 27  GLUCOSE 102* 110*  BUN 14 14  CREATININE 0.94 1.02  CALCIUM 8.4* 8.4*    PT/INR:  Recent Labs    09/15/18 1219  LABPROT 17.3*  INR 1.4*   ABG    Component Value Date/Time   PHART 7.406 09/15/2018 1619   HCO3 22.3 09/15/2018 1619   TCO2 20 (L) 09/15/2018 1826   ACIDBASEDEF 2.0 09/15/2018 1619   O2SAT 96.0 09/15/2018 1619   CBG (last 3)  Recent Labs    09/17/18 0331 09/17/18 0722 09/17/18 1128  GLUCAP 84 146* 105*    Assessment/Plan: S/P Procedure(s) (LRB): AORTIC VALVE REPAIR USING HAART 300 AORTIC ANNULOPLASTY DEVICE SIZE 21MM (N/A) TRANSESOPHAGEAL ECHOCARDIOGRAM (TEE) (N/A)  -POD3 aortic valve repair. Making an excellent recovery,  independent with mobility and maintaining good O2 saturations on RA. Cardiac rhythm stable.  Will discontinue pacer wires this AM, plan discharge to home later today.   -Obstructive sleep apnea- using [facility] at night  -HTN-BP well controlled on metoprolol 25mg  po bid.  Was on lisinopril prior to admission.   LOS: 3 days    Antony Odea, PA-C [phone] 09/18/2018

## 2018-09-18 NOTE — Progress Notes (Signed)
1100-1120 Pt put wife on phone so she could hear ed also. Reviewed sternal precautions and staying in the tube, IS, ex ed, watching sodium and heart healthy food choices, and CRP 2. Discussed virtual ex program. Pt is interested in referral to both. Referred to [facility].  Pt is interested in participating in Virtual Cardiac Rehab. Pt advised that Virtual Cardiac Rehab is provided at no cost to the patient. Graylon Good RN BSN 09/18/2018 11:19 AM    Checklist:  1. Pt has smart device  ie smartphone and/or ipad for downloading an app  Yes 2. Reliable internet/wifi service    Yes 3. Understands how to use their smartphone and navigate within an app.  Yes   Reviewed with pt the scheduling process for virtual cardiac rehab.  Pt verbalized understanding.

## 2018-09-18 NOTE — Discharge Summary (Signed)
Physician Discharge Summary  [name] MRN: [id number] DOB/AGE: [birth year] 53 y.o.  Admit date: 09/15/2018 Discharge date: 09/18/2018  Admission Diagnoses: Severe aortic insufficiency Chronic combined systolic and diastolic heart failure Hypertension Obstructive sleep apnea Coronary artery disease Dyslipidemia  Discharge Diagnoses:   Severe aortic insufficiency Chronic combined systolic and diastolic heart failure Hypertension Obstructive sleep apnea Coronary artery disease Dyslipidemia  S/P aortic valve repair   Discharged Condition: good  History of Present Illness:  Patient is a 53 year old male with history of coronary artery disease, hypertension, hyperlipidemia, obstructive sleep apnea, GE reflux disease, and 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  Patient's cardiac history dates back to 2016 when he presented with an acute non-ST segment elevation myocardial infarction.  He was found to have single-vessel coronary artery disease including 80% stenosis of the proximal left anterior descending coronary artery.  He was treated with PCI using drug-eluting stent.  He has done well since then and remain stable from a cardiac standpoint until recently.  He has never previously been told that he had a heart murmur and he has no reported history of rheumatic fever or rheumatic heart disease.  Several months ago the patient states that he developed relatively sudden onset of vague symptoms in his chest described as a vague tightness and a tendency to get short of breath and tired.  Symptoms ultimately prompted him to present to the emergency department in March with vague discomfort across his chest.  Cardiac enzymes were negative.  He was noted to have a murmur on physical exam and  transthoracic echocardiogram revealed normal left ventricular systolic function and at least moderate or severe aortic insufficiency.  There was moderate left ventricular chamber enlargement.  Transesophageal echocardiogram confirmed the presence of severe aortic insufficiency.  The aortic valve was notably trileaflet and without any signs of rheumatic disease.  He was seen in follow-up by cardiology and recommendations were made for the patient to undergo diagnostic cardiac catheterization.  Initially these diagnostic tests were postponed because of the ongoing COVID-19 pandemic.  Over the next several weeks the patient developed worsening symptoms of fatigue, weakness, exertional shortness of breath, and orthopnea.  He ultimately underwent catheterization on Aug 11, 2018 which revealed continued patency of the stents in the proximal left anterior descending coronary artery and no other significant coronary artery disease.  Filling pressures were normal.  The patient was referred for surgical consultation and has been evaluated previously by Dr. [name].  The patient was noted to have poor dentition and subsequently underwent dental extraction.  He was referred for second surgical opinion to discuss the feasibility of aortic valve repair as an alternative to surgical valve replacement.  Patient is married and lives with his wife locally in [place].  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  He has remained reasonably active physically all of his adult life although he admits that he does not exercise on a regular basis.  He enjoys performing yard work.  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  He describes relatively sudden onset of symptoms of exertional shortness of breath, vague chest pressure, generalized weakness, and worsening fatigue.  Symptoms  initially began several months ago and have rapidly progressed over  the last few weeks.  He now gets short of breath with moderate and occasionally low-level activity.  He cannot lay flat in bed.  He has had occasional mild dizzy spells.  He reports occasional palpitations.  He has not had syncope.  He denies any fevers, chills, or other constitutional symptoms to suggest the presence of ongoing bacterial endocarditis.  He denies any recent onset cough, fever, or other symptoms to suggest COVID-19 infection.  He has been practicing social distancing and he has not been exposed to any persons with known or suspected COVID-19 infection.  Hospital Course:  Mr. Sangha was admitted for same-day surgery on 09/15/18 and takent to the OR where the aortic valve was repaired as detailed in the operative note below. Following the procedure, he was transported to the ICU intubated and hemodynamically stable. He did not require inotropic support and remained stable. He was quickly weaned from the ventilator and extubated in the afternoon following surgery. He was mobilized early and made excellent progress. He was transferred to 4E progressive care on the second post-op day. O2 was weaned without difficulty. His CPAP machine was utilized at bedtime for his history or obstructive sleep apnea. His cardiac rhythm remained stable with mild sinus tachycardia. His post-op CXR was satisfactory. He was ready for discharge on the third post-op day. Pacer wires were removed.  Consults: None  Significant Diagnostic Studies:   Transthoracic Echocardiography  Patient: Isaac Patel MR #: [id number] Study Date: 10/30/2014 Gender: M Age: 71 Height: 180.3 cm Weight: 82.6 kg BSA: 2.04 m^2 Pt. Status: Room: 6C08C  ORDERING Shelva Majestic, M.D. REFERRING Shelva Majestic, M.D. ADMITTING [name], MD ATTENDING [name], MD SONOGRAPHER Leavy Cella PERFORMING [facility], Inpatient  cc:  ------------------------------------------------------------------- LV EF: 55% - 60%  ------------------------------------------------------------------- Indications: Chest pain 786.51.  ------------------------------------------------------------------- History: PMH: Coronary artery disease. PMH: Myocardial infarction. Risk factors: Current tobacco use. Dyslipidemia.  ------------------------------------------------------------------- Study Conclusions  - Left ventricle: The cavity size was normal. There was moderate concentric hypertrophy. Systolic function was normal. The estimated ejection fraction was in the range of 55% to 60%. Wall motion was normal; there were no regional wall motion abnormalities. Left ventricular diastolic function parameters were normal. - Aortic valve: Trileaflet; normal thickness leaflets. There was no regurgitation. - Aortic root: The aortic root was normal in size. - Mitral valve: Structurally normal valve. There was no regurgitation. - Right ventricle: The cavity size was normal. Wall thickness was normal. Systolic function was normal. - Right atrium: The atrium was normal in size. - Tricuspid valve: There was no regurgitation. - Pulmonic valve: There was no regurgitation. - Pulmonary arteries: Systolic pressure couldn&'t be assessed as there was no tricuspid regurgitation. - Inferior vena cava: The vessel was normal in size. - Pericardium, extracardiac: There was no pericardial effusion.  Impressions:  - Normal study.  Transthoracic echocardiography. M-mode, complete 2D, spectral Doppler, and color Doppler. Birthdate: Patient birthdate: 07-03-1965. Age: Patient is 53 yr old. Sex: Gender: male. BMI: 25.4 kg/m^2. Blood pressure: 145/96 Patient status: Inpatient. Study date: Study date: 10/30/2014. Study time: 09:16 AM. Location:  Bedside.  -------------------------------------------------------------------  ------------------------------------------------------------------- Left ventricle: The cavity size was normal. There was moderate concentric hypertrophy. Systolic function was normal. The estimated ejection fraction was in the range of 55% to 60%. Wall motion was normal; there were no regional wall motion abnormalities. The transmitral flow pattern was normal. The deceleration time of the early transmitral flow velocity was normal. The pulmonary vein flow pattern was normal. The tissue Doppler  parameters were normal. Left ventricular diastolic function parameters were normal.  ------------------------------------------------------------------- Aortic valve: Trileaflet; normal thickness leaflets. Mobility was not restricted. Doppler: Transvalvular velocity was within the normal range. There was no stenosis. There was no regurgitation.  ------------------------------------------------------------------- Aorta: Aortic root: The aortic root was normal in size.  ------------------------------------------------------------------- Mitral valve: Structurally normal valve. Mobility was not restricted. Doppler: Transvalvular velocity was within the normal range. There was no evidence for stenosis. There was no regurgitation.  ------------------------------------------------------------------- Left atrium: The atrium was normal in size.  ------------------------------------------------------------------- Right ventricle: The cavity size was normal. Wall thickness was normal. Systolic function was normal.  ------------------------------------------------------------------- Pulmonic valve: Structurally normal valve. Cusp separation was normal. Doppler: Transvalvular velocity was within the normal range. There was no evidence for stenosis. There was  no regurgitation.  ------------------------------------------------------------------- Tricuspid valve: Structurally normal valve. Doppler: Transvalvular velocity was within the normal range. There was no regurgitation.  ------------------------------------------------------------------- Pulmonary artery: The main pulmonary artery was normal-sized. Systolic pressure couldn&'t be assessed as there was no tricuspid regurgitation.  ------------------------------------------------------------------- Right atrium: The atrium was normal in size.  ------------------------------------------------------------------- Pericardium: There was no pericardial effusion.  ------------------------------------------------------------------- Systemic veins: Inferior vena cava: The vessel was normal in size.  ------------------------------------------------------------------- Measurements  Left ventricle Value Reference LV ID, ED, PLAX chordal 46.5 mm 43 - 52 LV ID, ES, PLAX chordal 31.3 mm 23 - 38 LV fx shortening, PLAX chordal 33 % >=29 LV PW thickness, ED 14.5 mm --------- IVS/LV PW ratio, ED 0.87 <=1.3 LV ejection fraction, 1-p A4C 44 % --------- LV end-diastolic volume, 2-p 96 ml --------- LV end-systolic volume, 2-p 44 ml --------- LV ejection fraction, 2-p 54 % --------- Stroke volume, 2-p 52 ml --------- LV end-diastolic volume/bsa, 2-p 47 ml/m^2 --------- LV end-systolic volume/bsa, 2-p 22 ml/m^2 --------- Stroke volume/bsa, 2-p 25.5 ml/m^2 --------- LV e&', lateral 8.99 cm/s --------- LV E/e&', lateral 6.59  --------- LV e&', medial 8.33 cm/s --------- LV E/e&', medial 7.11 --------- LV e&', average 8.66 cm/s --------- LV E/e&', average 6.84 ---------  Ventricular septum Value Reference IVS thickness, ED 12.6 mm ---------  Aorta Value Reference Aortic root ID, ED 38 mm ---------  Left atrium Value Reference LA ID, A-P, ES 39 mm --------- LA ID/bsa, A-P 1.91 cm/m^2 <=2.2 LA volume, S 38 ml --------- LA volume/bsa, S 18.6 ml/m^2 --------- LA volume, ES, 1-p A4C 37 ml --------- LA volume/bsa, ES, 1-p A4C 18.1 ml/m^2 --------- LA volume, ES, 1-p A2C 38 ml --------- LA volume/bsa, ES, 1-p A2C 18.6 ml/m^2 ---------  Mitral valve Value Reference Mitral E-wave peak velocity 59.2 cm/s --------- Mitral A-wave peak velocity 37 cm/s --------- Mitral deceleration time 225 ms 150 - 230 Mitral E/A ratio, peak 1.6 ---------  Systemic veins Value Reference Estimated CVP 3 mm Hg ---------  Legend: (L) and (H) mark values outside specified reference range.  ------------------------------------------------------------------- Prepared and Electronically Authenticated by  Ena Dawley, M.D. 2016-08-03T14:13:37   ECHOCARDIOGRAM REPORT     Patient Name: [name] Date of Exam:  06/07/2018 Medical Rec #: [id number] Height: 71.0 in Accession #: [id number] Weight: 210.8 lb Date of Birth: [birth year] BSA: 2.16 m Patient Age: 89 years BP: 112/56 mmHg Patient Gender: M HR: 79 bpm. Exam Location: Inpatient   Procedure: 2D Echo  Indications: 786.50 chest pain  History: Patient has prior history of Echocardiogram examinations, most recent 10/30/2014. CAD; Risk Factors: Hypertension and Dyslipidemia. NSTEMI. OSA.  Sonographer: Jannett Celestine RDCS (AE) Referring Phys: [id number] [place]   1. The left ventricle has normal  systolic function, with an ejection fraction of 55-60%. The cavity size was moderately dilated. Left ventricular diastolic Doppler parameters are indeterminate. 2. The right ventricle has normal systolic function. The cavity was normal. There is no increase in right ventricular wall thickness. Right ventricular systolic pressure is mildly elevated with an estimated pressure of 42.2 mmHg. 3. Mild thickening of the mitral valve leaflet. Mild calcification of the mitral valve leaflet. 4. The aortic valve is abnormal Mild thickening of the aortic valve Mild calcification of the aortic valve. Aortic valve regurgitation is moderate to severe by color flow Doppler. 5. Moderate (based on pressure half time of 251 ms) to severe (based on vena contracta of 0.93 cm) aortic regurgitation. Cannot definitively determine if valve is bicuspid or tricuspid. 6. The inferior vena cava was dilated in size with <50% respiratory variability.  SUMMARY  Abnormal flow in LVOT and aortic root. On short axis images, also appears to cross into right atrium vs. out of frame turbulent flow into RCA. Similar pattern on apical views (see image 44, 57). May be Sinus of Valsalva rupture vs. eccentric moderate-severe  aortic regurgitation with dilated LV (LVEDD 6.19 cm). Cannot determine if valve is bicuspid or tricuspid on these images. Recommend TEE or cardiac MRI to better assess aortic valve structur/regurgitation vs. Sinus of Valsalva rupture. FINDINGS Left Ventricle: The left ventricle has normal systolic function, with an ejection fraction of 55-60%. The cavity size was moderately dilated. There is no increase in left ventricular wall thickness. Left ventricular diastolic Doppler parameters are  indeterminate. Right Ventricle: The right ventricle has normal systolic function. The cavity was normal. There is no increase in right ventricular wall thickness. Right ventricular systolic pressure is mildly elevated with an estimated pressure of 42.2 mmHg. Left Atrium: left atrial size was normal in size Right Atrium: right atrial size was normal in size. Right atrial pressure is estimated at 15 mmHg. Interatrial Septum: No atrial level shunt detected by color flow Doppler. Pericardium: There is no evidence of pericardial effusion. Mitral Valve: The mitral valve is normal in structure. Mild thickening of the mitral valve leaflet. Mild calcification of the mitral valve leaflet. Mitral valve regurgitation is trivial by color flow Doppler. Tricuspid Valve: The tricuspid valve is normal in structure. Tricuspid valve regurgitation was not visualized by color flow Doppler. Aortic Valve: The aortic valve is abnormal Mild thickening of the aortic valve Mild calcification of the aortic valve. Aortic valve regurgitation is moderate to severe by color flow Doppler. There is no evidence of aortic valve stenosis. Moderate (based  on pressure half time of 251 ms) to severe (based on vena contracta of 0.93 cm) aortic regurgitation. Cannot definitively determine if valve is bicuspid or tricuspid. Pulmonic Valve: The pulmonic valve was grossly normal. Pulmonic valve regurgitation is trivial by color flow Doppler. Venous: The  inferior vena cava is dilated in size with less than 50% respiratory variability.  LEFT VENTRICLE PLAX 2D LVIDd: 6.19 cm Diastology LVIDs: 4.02 cm LV e' lateral: 11.50 cm/s LV PW: 1.07 cm LV E/e' lateral: 6.8 LV IVS: 1.05 cm LV e' medial: 7.60 cm/s LVOT diam: 2.40 cm LV E/e' medial: 10.3 LV SV: 122 ml LV SV Index: 55.41 LVOT Area: 4.52 cm  RIGHT VENTRICLE RV Basal diam: 4.17 cm RV S prime: 17.30 cm/s TAPSE (M-mode): 2.5 cm RVSP: 42.2 mmHg  LEFT ATRIUM Index RIGHT ATRIUM Index LA diam: 4.00 cm 1.86 cm/m RA Pressure: 15 mmHg LA Vol (A2C): 64.8 ml 30.06 ml/m RA Area: 17.20 cm  LA Vol (A4C): 55.2 ml 25.60 ml/m RA Volume: 36.60 ml 16.98 ml/m LA Biplane Vol: 63.8 ml 29.59 ml/m AORTIC VALVE LVOT Vmax: 89.40 cm/s LVOT Vmean: 65.100 cm/s LVOT VTI: 0.215 m  AORTA Ao Root diam: 3.30 cm  MITRAL VALVE TRICUSPID VALVE MV Area (PHT): 3.77 cm TR Peak grad: 27.2 mmHg MV PHT: 58.29 msec TR Vmax: 261.00 cm/s MV Decel Time: 201 msec RVSP: 42.2 mmHg MV E velocity: 78.10 cm/s MV A velocity: 43.70 cm/s SHUNTS MV E/A ratio: 1.79 Systemic VTI: 0.22 m Systemic Diam: 2.40 cm   Buford Dresser MD Electronically signed by Buford Dresser MD Signature Date/Time: 06/07/2018/2:56:26 PM    TRANSESOPHOGEAL ECHO REPORT     Patient Name: [name] Date of Exam: 06/08/2018 Medical Rec #: [id number] Height: 71.0 in Accession #: [id number] Weight: 210.8 lb Date of Birth: 28-Nov-1965 BSA: 2.16 m Patient Age: 42 years BP: 113/78 mmHg Patient Gender: M HR: 73 bpm. Exam Location: Inpatient   Procedure: 2D Echo  Indications: aortic regurgitation evaluation.  chest pain 786.50  History: Patient has prior history of Echocardiogram examinations, most recent 06/07/2018. Risk Factors: Hypertension and Dyslipidemia.  Sonographer: Jannett Celestine RDCS (AE) Referring Phys: [id number] PETER M [place] Diagnosing Phys: Buford Dresser MD    PROCEDURE: No source of intracardiac mass. No evidence of intracardiac thrombus. No evidence of vegetation. Consent was requested emergently by emergency room physicain. After discussion of the risks and benefits of a TEE, an informed consent was  obtained from the patient. Local oropharyngeal anesthetic was provided with cetacaine. Patients was under conscious sedation during this procedure. Anesthetic was administered intravenously by performing Physician: 52mcg of Fentanyl, 4.0mg  of Versed. The transesophogeal probe was passed through the esophogus of the patient. Imaged were obtained with the patient in a left lateral decubitus position. Image quality was good. The patient's vital signs; including heart rate, blood pressure, and oxygen  saturation; remained stable throughout the procedure. The patient developed no complications during the procedure.  IMPRESSIONS   1. The left ventricle has low normal systolic function, with an ejection fraction of 50-55%. The cavity size was moderately dilated. No evidence of left ventricular regional wall motion abnormalities. 2. The right ventricle has normal systolc function. The cavity was normal. There is no increase in right ventricular wall thickness. 3. No evidence of mitral valve stenosis. No mitral valve vegetation visualized. 4. The aortic valve is tricuspid Aortic valve regurgitation is severe by color flow Doppler. 5. There is evidence of mild plaque in the descending aorta.  SUMMARY  Eccentric AR jet consistent with severe aortic regurgitation. 3D images and color flow attempted to further evaluate. No windsock or clear  evidence of Sinus of Valsalva rupture, and no flow seen on RA/RV views to suggest SoV rupture or VSD as cause. Jet difficult to measure given acute angle from cusp closure. Vena contracta estimated at at least 0.8 cm. Appears that there may be a prolapsed cusp as the etiology of the AI.  Patient was very sensitive to movement of the probe. He did not tolerate transgastric views. Additional views attempted for further assessment of the aortic valve, but he began coughing/gagging, with sinus tachycardia in the 150s. Blood pressure at the time was 101/39, and given wide pulse pressure, decision was made to end the case instead of administering additional sedation.  TTE and TEE evidence together suggests severe eccentric AR, possibly due to prolapsed coronary cusp, with moderately dilated LV and borderline EF. Meets criteria for aortic valve replacement. FINDINGS Left  Ventricle: The left ventricle has low normal systolic function, with an ejection fraction of 50-55%. The cavity size was moderately dilated. There is no increase in left ventricular wall thickness. No evidence of left ventricular regional wall  motion abnormalities. Right Ventricle: The right ventricle has normal systolic function. The cavity was normal. There is no increase in right ventricular wall thickness. Left Atrium: Left atrial size was normal in size. Right Atrium: Right atrial size was normal in size. Right atrial pressure is estimated at 10 mmHg. Interatrial Septum: No atrial level shunt detected by color flow Doppler. Pericardium: There is no evidence of pericardial effusion. Mitral Valve: The mitral valve is normal in structure. Mitral valve regurgitation is trivial by color flow Doppler. No evidence of mitral valve stenosis. There is no evidence of mitral valve vegetation. Tricuspid Valve: The tricuspid valve was normal in structure. Tricuspid valve regurgitation was not visualized by color flow Doppler. No TV  vegetation was visualized. Aortic Valve: The aortic valve is tricuspid Aortic valve regurgitation is severe by color flow Doppler. There is no evidence of aortic valve stenosis. There is no evidence of a vegetation on the aortic valve. Pulmonic Valve: The pulmonic valve was normal in structure. Pulmonic valve regurgitation is trivial by color flow Doppler. No evidence of pulmonic stenosis. Aorta: There is evidence of mild plaque in the descending aorta. Venous: The inferior vena cava is normal in size with greater than 50% respiratory variability.  LV Wall Scoring:  RIGHT ATRIUM RA Pressure: 10 mmHg   Buford Dresser MD Electronically signed by Buford Dresser MD Signature Date/Time: 06/09/2018/7:57:21 AM    RIGHT/LEFT HEART CATH AND CORONARY ANGIOGRAPHY  Conclusion     Previously placed Ost LAD to Prox LAD stent (unknown type) is widely patent.  Prox Cx to Mid Cx lesion is 10% stenosed.  1. Patent proximal LAD stent 2. Normal filling pressures    Recommendations   Antiplatelet/Anticoag Medical management of CAD. Proceed with workup for aortic valve replacement.  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   Procedure: The risks, benefits, complications, treatment options, and expected outcomes were discussed with the patient. The patient and/or family concurred with the proposed plan, giving informed consent. The patient was brought to the cath lab after IV hydration was given. The patient was sedated with Versed and Fentanyl. I changed out the right antecubital vein IV catheter for a 5 French sheath. Right heart catheterization performed with a balloon tipped catheter. The right groin was prepped and draped in the usual manner. Using the modified Seldinger access technique, a 5  French sheath was placed in the right femoral artery using u/s guidance. Standard diagnostic catheters were used to perform selective coronary angiography. LV pressures measured with a JR4 catheter.   There were no immediate complications. The patient was taken to the recovery area in stable condition.   Estimated blood loss <50 mL.   During this procedure medications were administered to achieve and maintain moderate conscious sedation while the patient's heart rate, blood pressure, and oxygen saturation were continuously monitored and I was present face-to-face 100% of this time.  Medications  (Filter: Administrations occurring from 08/11/18 0726 to 08/11/18 8502)  Medication Rate/Dose/Volume Action  Date Time   midazolam (VERSED) injection (mg) 1 mg Given 08/11/18 0740   Total dose as of 09/01/18 1240 1 mg Given 0804  2 mg        fentaNYL (SUBLIMAZE) injection (mcg) 25 mcg Given 08/11/18 0740   Total dose as of 09/01/18 1240 25 mcg Given 0804   50 mcg        lidocaine (PF) (XYLOCAINE) 1 % injection (mL) 2 mL Given 08/11/18 0753   Total dose as of 09/01/18 1240 15 mL Given 0801   17 mL        Heparin (Porcine) in NaCl 1000-0.9 UT/500ML-% SOLN (mL) 500 mL Given 08/11/18 0809   Total dose as of 09/01/18 1240 500 mL Given 0809   1,000 mL        iohexol (OMNIPAQUE) 350 MG/ML injection (mL) 55 mL Given 08/11/18 0820   Total dose as of 09/01/18 1240        55 mL        Sedation Time   Sedation Time Physician-1: 35 minutes 34 seconds  Complications   Complications documented before study signed (08/11/2018 8:31 AM EDT)    RIGHT/LEFT HEART CATH AND CORONARY ANGIOGRAPHY   None Documented by Burnell Blanks, MD 08/11/2018 8:30 AM EDT  Time Range: Intraprocedure      Coronary Findings   Diagnostic  Dominance: Right  Left Anterior Descending  Ost LAD to Prox LAD lesion 0% stenosed  Previously placed Ost LAD to Prox LAD  stent (unknown type) is widely patent.  Left Circumflex  Prox Cx to Mid Cx lesion 10% stenosed  Prox Cx to Mid Cx lesion is 10% stenosed.  Intervention   No interventions have been documented.  Coronary Diagrams   Diagnostic  Dominance: Right    Intervention   Treatments:   CARDIOTHORACIC SURGERY OPERATIVE NOTE  Date of Procedure:                09/15/2018  Preoperative Diagnosis:      Severe Aortic Insufficiency  Postoperative Diagnosis:    Same   Procedure:        Aortic Valve Repair             Complex valvuloplasty including plication of prolapsing right coronary cusp Biostable HAART aortic ring annuloplasty(size 84mm, ref# [id number], lot# [id number])                          Surgeon:        Valentina Gu. Roxy Manns, MD  Assistant:       Gaye Pollack, MD and Enid Cutter, PA-C  Anesthesia:    Roberts Gaudy, MD  Operative Findings: ? Prolapse of right coronary cusp ? Severe aortic insufficiency ? Moderate left ventricular chamber enlargement ? 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  Patient's cardiac history dates back to 2016 when he presented with an acute non-ST segment elevation myocardial infarction. He was found to have single-vessel coronary artery disease including 80% stenosis of the proximal left anterior descending coronary artery. He was treated with PCI using drug-eluting stent. He has done well since then and remain stable from a cardiac standpoint until recently.  He has never previously been told  that he had a heart murmur and he has no reported history of rheumatic fever or rheumatic heart disease. Several months ago the patient states that he developed relatively sudden onset of vague symptoms in his chest described as a vague tightness and a tendency to get short of breath and tired. Symptoms ultimately prompted him to present to the emergency department in March with vague discomfort across his chest. Cardiac enzymes were negative. He was noted to have a murmur on physical exam and transthoracic echocardiogram revealed normal left ventricular systolic function and at least moderate or severe aortic insufficiency. There was moderate left ventricular chamber enlargement. Transesophageal echocardiogram confirmed the presence of severe aortic insufficiency. The aortic valve was notably trileaflet and without any signs of rheumatic disease. He was seen in follow-up by cardiology and recommendations were made for the patient to undergo diagnostic cardiac catheterization. Initially these diagnostic tests were postponed because of the ongoing COVID-19 pandemic. Over the next several weeks the patient developed worsening symptoms of fatigue, weakness, exertional shortness of breath, and orthopnea. He ultimately underwent catheterization on Aug 11, 2018 which revealed continued patency of the stents in the proximal left anterior descending coronary artery and no other significant coronary artery disease. Filling pressures were normal. The patient was referred for surgical consultation and has been evaluated previously by Dr. [name]. The patient was noted to have poor dentition and subsequently underwent dental extraction later this week. He was referred for second surgical opinion to discuss the feasibility of aortic valve repair as an alternative to surgical valve replacement.  The patient has been seen in consultation and counseled at length regarding the  indications, risks and potential benefits of surgery.  All questions have been answered, and the patient provides full informed consent for the operation as described.   Discharge Exam: Blood pressure (!) 117/104, pulse (!) 101, temperature 98.4 F (36.9 C), temperature source Oral, resp. rate (!) 29, height 5\' 11"  (1.803 m), weight 92 kg, SpO2 98 %.  General appearance: no distress Neurologic: intact Heart: regular rhythm, mild tachycardia. S1, S2 normal, no murmur, click, rub or gallop Lungs: clear to auscultation bilaterally Wound: the sternotomy incision is clean and dry.  Disposition:    Allergies as of 09/18/2018      Reactions   Morphine And Related Itching      Medication List    STOP taking these medications   acetaminophen 500 MG tablet Commonly known as: TYLENOL   lisinopril 20 MG tablet Commonly known as: ZESTRIL     TAKE these medications   aspirin EC 81 MG tablet Take 81 mg by mouth daily.   atorvastatin 80 MG tablet Commonly known as: LIPITOR Take 1 tablet (80 mg total) by mouth daily at 6 PM.   calcium carbonate 500 MG chewable tablet Commonly known as: TUMS - dosed in mg elemental calcium Chew 2 tablets by mouth daily as needed for indigestion or heartburn.   fluticasone 50 MCG/ACT nasal spray Commonly known as: FLONASE Place 1 spray into both nostrils daily as needed for allergies or rhinitis.   furosemide 20 MG tablet Commonly known as: LASIX Take 1 tablet every Monday, Wednesday and Friday.   gabapentin 100 MG capsule Commonly known as: NEURONTIN Take 2 capsules (200 mg total) by mouth at bedtime.   loratadine 10 MG tablet Commonly known as: CLARITIN Take 10 mg by mouth daily.   metoprolol tartrate 25 MG tablet Commonly known as: LOPRESSOR Take 1 tablet (25 mg total) by mouth 2 (  two) times daily.   MULTI-VITAMIN PO Take 1 tablet by mouth daily.   nitroGLYCERIN 0.4 MG SL tablet Commonly known as: NITROSTAT Place 1 tablet (0.4 mg  total) under the tongue every 5 (five) minutes x 3 doses as needed for chest pain.   oxyCODONE-acetaminophen 5-325 MG tablet Commonly known as: Percocet Take 1 tablet by mouth every 4 (four) hours as needed for up to 7 days for severe pain.   pantoprazole 40 MG tablet Commonly known as: PROTONIX Take 1 tablet (40 mg total) by mouth 2 (two) times daily.      Follow-up Information    Richardson Dopp T, PA-C Follow up on 10/03/2019.   Specialties: Cardiology, Physician Assistant Why: You have a cardiology telehealth follow up visit scheduled with Richardson Dopp, PA-C on July 7,2020 at 1:45pm. Contact information: [address] [place] [address] [phone]        Rexene Alberts, MD. Go on 10/16/2018.   Specialty: Cardiothoracic Surgery Why: You have an appointment with Dr. Roxy Manns on Monday 10/16/18 at 10am. Please arrive 30 minutes early for a chest x-ray to be done at [facility] located on the first floor of the same building.  Contact information: [place] [address] [place] [address] [phone]          The patient has been discharged on:   1.Beta Blocker:  Yes [ x  ]                              No   [   ]                              If No, reason:  2.Ace Inhibitor/ARB: Yes [   ]                                     No  [    ]                                     If No, reason: BP soft, plan to resume as out patient  3.Statin:   Yes [  x ]                  No  [   ]                  If No, reason:  4.Ecasa:  Yes  [ x  ]                  No   [   ]                  If No, reason:   Signed: Antony Odea , PA-C 09/18/2018, 10:52 AM

## 2018-09-18 NOTE — Progress Notes (Signed)
CARDIAC REHAB PHASE I   PRE:  Rate/Rhythm: 112 ST  BP:  Supine:   Sitting: 118/90  Standing:    SaO2: 93%RA  MODE:  Ambulation: 700 ft   POST:  Rate/Rhythm: 120 ST  BP:  Supine:   Sitting: 129/85  Standing:    SaO2: 95%RA 0845-0920 Pt walked 700 ft on RA with steady gait without safety device. Tolerated well. To sitting on side of bed after walk. Pt stated he has not had breakfast yet. Also stated going home today and wants wife to listen per phone. Will follow up after pt has had his breakfast for ed.   Graylon Good, RN BSN  09/18/2018 9:15 AM

## 2018-09-18 NOTE — Discharge Instructions (Signed)

## 2018-09-18 NOTE — Consult Note (Signed)
   [facility] CM Inpatient Consult   09/18/2018  Isaac Patel 1966/03/09 [id number]   Patient is currently active with [name] for chronic disease management services.  Patient has been engaged by a [name].  Our community based plan of care has focused on disease management and community resource support.  Patient will receive a post hospital call and will be evaluated for assessments and disease process education.  Will follow progress and touch basis with Inpatient [name] to make aware that [place] Management following.  [name] is aware of patient's hospitalization.   Of note, [facility] Care Management services does not replace or interfere with any services that are needed or arranged by inpatient TOC case management or social work.  For additional questions or referrals please contact:  Isaac Brood, RN BSN [facility] Liaison  [phone] business mobile phone Toll free office [phone]  Fax number: [phone] [email or web address]

## 2018-09-19 ENCOUNTER — Telehealth (HOSPITAL_COMMUNITY): Payer: Self-pay

## 2018-09-19 MED FILL — Heparin Sodium (Porcine) Inj 1000 Unit/ML: INTRAMUSCULAR | Qty: 2500 | Status: AC

## 2018-09-19 MED FILL — Mannitol IV Soln 20%: INTRAVENOUS | Qty: 1000 | Status: AC

## 2018-09-19 MED FILL — Heparin Sodium (Porcine) Inj 1000 Unit/ML: INTRAMUSCULAR | Qty: 30 | Status: AC

## 2018-09-19 MED FILL — Electrolyte-R (PH 7.4) Solution: INTRAVENOUS | Qty: 3000 | Status: AC

## 2018-09-19 MED FILL — Lidocaine HCl Local Soln Prefilled Syringe 100 MG/5ML (2%): INTRAMUSCULAR | Qty: 25 | Status: AC

## 2018-09-19 MED FILL — Sodium Bicarbonate IV Soln 8.4%: INTRAVENOUS | Qty: 50 | Status: AC

## 2018-09-19 MED FILL — Potassium Chloride Inj 2 mEq/ML: INTRAVENOUS | Qty: 40 | Status: AC

## 2018-09-19 MED FILL — Sodium Chloride IV Soln 0.9%: INTRAVENOUS | Qty: 3000 | Status: AC

## 2018-09-19 MED FILL — Magnesium Sulfate Inj 50%: INTRAMUSCULAR | Qty: 10 | Status: AC

## 2018-09-19 NOTE — Telephone Encounter (Signed)
Referral signed and received from Dr. Roxy Manns for this pt to participate in Cardiac Rehab.  Due to the departmental closure based on national recommendations for Covid-19 we are unable to provide group exercise at this time. Pt is aware of the closure and the option to participate in virtual cardiac rehab  at no cost to the patient from phase I rehab staff. Option also for hybrid cardiac rehab for in facility visits/session. Insurance benefits and eligibility determined  will be shared with the patient for any in facility visits/sessions. Follow up appt on 10/03/2018.

## 2018-09-20 ENCOUNTER — Encounter: Payer: Self-pay | Admitting: *Deleted

## 2018-09-20 ENCOUNTER — Other Ambulatory Visit: Payer: Self-pay | Admitting: *Deleted

## 2018-09-20 NOTE — Patient Outreach (Addendum)
[facility]) Care Management  09/20/2018  Isaac Patel [birth year] [id number]  Transition of care call/case closure   Referral received: 09/07/18 Initial outreach: 09/14/18 for preoperative screening call Insurance: [facility] Choice Plan   Subjective: Initial successful telephone call to patient's home number in order to complete transition of care assessment; 2 HIPAA identifiers verified. Explained purpose of call and completed transition of care assessment.  Mr. Isaac Patel states he is requiring the oxycodone every 4 hours to control his surgical pain.  States he is tolerating diet, denies bowel or bladder problems.  Spouse is assisting with his recovery.  He wants to make sure his short term disability and hospital indemnity paperwork is completed.   Objective:  Mr. Isaac Patel had aortic value repair or replacement on 09/15/18 at [facility]. Comorbidities include: CAD, HTN, non St elevated myocardial infarction, deviated septum, OSA, GERD, fatty liver, below elbow amputation, hearing loss in right ear, hyperlipidemia, prediabetes, tobacco abuse  He was discharged to home on 09/18/18 without the need for home health services or DME.   Assessment:  Patient voices good understanding of all discharge instructions.  See transition of care flowsheet for assessment details. Smoking cessation information and [place] smoking cessation resources was provided to patient in his discharge instructions.    Plan:  With Mr. Isaac Patel permission, forwarded Lonia Farber e-mail of 09/19/18 to his personal e-mail, with the explanation of the [facility] - a new center that will be the hub and one-stop shop for leader and employee HR needs to assist him with his benefit questions/concerns about his short term disability and hospital indemnity insurance.  Reviewed hospital discharge diagnosis of aortic valve repair and treatment plan using hospital discharge  instructions, assessing medication adherence, reviewing postoperative problems requiring provider notification, and discussing the importance of follow up with surgeon, primary care provider and specialists as directed. No ongoing care management needs identified so will close case to [name] services and route successful outreach letter with [place] Management pamphlet and 24 Hour Nurse Line Magnet to [name] clinical pool to be mailed to patient's home address.   Barrington Ellison RN,CCM,CDE [place] Management Coordinator Office Phone [phone] Office Fax [phone]

## 2018-09-25 ENCOUNTER — Other Ambulatory Visit: Payer: Self-pay

## 2018-09-25 ENCOUNTER — Telehealth: Payer: Self-pay

## 2018-09-25 MED ORDER — TRAMADOL HCL 50 MG PO TABS: 50.0000 mg | ORAL_TABLET | Freq: Four times a day (QID) | ORAL | 0 refills | Status: AC | PRN

## 2018-09-25 NOTE — Telephone Encounter (Signed)
Contacted patient's preferred pharmacy and patient regarding medication changes.

## 2018-09-25 NOTE — Telephone Encounter (Signed)
-----   Message from Rexene Alberts, MD sent at 09/25/2018 11:08 AM EDT ----- Regarding: RE: Refill Tramadol sounds good ----- Message ----- From: Donnella Sham, RN Sent: 09/25/2018  10:08 AM EDT To: Rexene Alberts, MD Subject: Refill                                         Good Morning,  Isaac Patel is requesting additional pain medication.  Stated he is taking it every 4-6 hours.  He has 3 pills left.  Would Tramadol be ok?  Wife requested something other than Percocet so she can give him Tylenol in between pain medication.  Thanks,  Caryl Pina

## 2018-09-27 ENCOUNTER — Encounter: Payer: Self-pay | Admitting: Thoracic Surgery (Cardiothoracic Vascular Surgery)

## 2018-09-27 ENCOUNTER — Other Ambulatory Visit: Payer: Self-pay

## 2018-09-27 ENCOUNTER — Encounter: Payer: 59 | Admitting: Nurse Practitioner

## 2018-09-27 ENCOUNTER — Encounter: Payer: 59 | Admitting: Internal Medicine

## 2018-09-28 ENCOUNTER — Ambulatory Visit (INDEPENDENT_AMBULATORY_CARE_PROVIDER_SITE_OTHER): Payer: Self-pay | Admitting: Physician Assistant

## 2018-09-28 DIAGNOSIS — Z4802 Encounter for removal of sutures: Secondary | ICD-10-CM

## 2018-09-28 DIAGNOSIS — G8918 Other acute postprocedural pain: Secondary | ICD-10-CM

## 2018-09-28 MED ORDER — OXYCODONE-ACETAMINOPHEN 5-325 MG PO TABS: 1.0000 | ORAL_TABLET | Freq: Every evening | ORAL | 0 refills | Status: DC | PRN

## 2018-09-28 NOTE — Patient Instructions (Signed)
Please return for your follow-up appointment

## 2018-10-02 ENCOUNTER — Telehealth: Payer: Self-pay | Admitting: *Deleted

## 2018-10-02 NOTE — Telephone Encounter (Signed)

## 2018-10-03 ENCOUNTER — Ambulatory Visit: Payer: 59 | Admitting: Physician Assistant

## 2018-10-03 ENCOUNTER — Other Ambulatory Visit: Payer: Self-pay

## 2018-10-03 ENCOUNTER — Encounter: Payer: Self-pay | Admitting: Physician Assistant

## 2018-10-03 VITALS — BP 124/86 | HR 67 | Ht 71.0 in | Wt 202.8 lb

## 2018-10-03 DIAGNOSIS — E785 Hyperlipidemia, unspecified: Secondary | ICD-10-CM

## 2018-10-03 DIAGNOSIS — Z9889 Other specified postprocedural states: Secondary | ICD-10-CM | POA: Diagnosis not present

## 2018-10-03 DIAGNOSIS — I351 Nonrheumatic aortic (valve) insufficiency: Secondary | ICD-10-CM

## 2018-10-03 DIAGNOSIS — I10 Essential (primary) hypertension: Secondary | ICD-10-CM | POA: Diagnosis not present

## 2018-10-03 DIAGNOSIS — I2510 Atherosclerotic heart disease of native coronary artery without angina pectoris: Secondary | ICD-10-CM

## 2018-10-03 NOTE — Patient Instructions (Addendum)
Medication Instructions:  Your physician recommends that you continue on your current medications as directed. Please refer to the Current Medication list given to you today.  If you need a refill on your cardiac medications before your next appointment, please call your pharmacy.   Lab work: NONE ORDERED  TODAY   If you have labs (blood work) drawn today and your tests are completely normal, you will receive your results only by: Marland Kitchen MyChart Message (if you have MyChart) OR . A paper copy in the mail If you have any lab test that is abnormal or we need to change your treatment, we will call you to review the results.  Testing/Procedures: IN 6 TO 8 WEEKS Your physician has requested that you have an echocardiogram. Echocardiography is a painless test that uses sound waves to create images of your heart. It provides your doctor with information about the size and shape of your heart and how well your heart's chambers and valves are working. This procedure takes approximately one hour. There are no restrictions for this procedure.  Follow-Up: At [facility], you and your health needs are our priority.  As part of our continuing mission to provide you with exceptional heart care, we have created designated Provider Care Teams.  These Care Teams include your primary Cardiologist (physician) and Advanced Practice Providers (APPs -  Physician Assistants and Nurse Practitioners) who all work together to provide you with the care you need, when you need it. You will need a follow up appointment in 3 months. You may see [name], MD or one of the following Advanced Practice Providers on your designated Care Team:   Rand, PA-C Melina Copa, PA-C . Ermalinda Barrios, PA-C  Any Other Special Instructions Will Be Listed Below (If Applicable).

## 2018-10-03 NOTE — Progress Notes (Signed)
Cardiology Office Note:    Date:  10/03/2018   ID:  [name], DOB [birth year], MRN [id number]  PCP:  Biagio Borg, MD  Cardiologist:  [name], MD   Electrophysiologist:  None   Referring MD: Biagio Borg, MD   Chief Complaint  Patient presents with  . Hospitalization Follow-up    s/p AoV Repair     History of Present Illness:    Isaac Patel is a 53 y.o. male with:  Coronary artery disease   S/p NSTEMI 10/2014 >> PCI:  DES to prox LAD  LHC 2018 - Patent LAD stent  LHC 08/11/2018:  LAD stent patent  Aortic insufficiency  S/p Aortic Valve repair 08/2018 (HAART Aortic Ring Annuloplasty size 21 mm)  Hypertension  Hyperlipidemia  OSA  Traumatic amputation of L upper ext  Isaac Patel returns for follow up after recent aortic valve repair.  He is here alone.  He is slowly progressing.  He is worried about the chest tube wound on the L.  It is opened and seems to be slow to heal.  He has not had a fever and has not had any redness.  His chest is sore.  He is walking 15 mins twice a day.  He has not had significant shortness of breath.  He has poor sleep and wakes up several times a night.  His appetite is poor on some days.  He is still taking Tramadol and takes Oxycodone prior to bed.    Prior CV studies:   The following studies were reviewed today:  Carotid US 09/13/2018 bilateral ICA 1-39  Cardiac catheterization 08/11/2018 LAD stent patent LCx 10  Transesophageal echocardiogram 06/08/2018 EF 50-55, moderately dilated, normal RV SF, severe AI, mild plaque descending aorta  Echocardiogram 06/07/2018 EF 55-60, moderately dilated, normal RV SF, mild MAC, moderate to severe AI  LHC 05/25/16 LAD prox stent ok LCx irregs RCA normal EF 55  Echo 10/30/14 Moderate LVH, EF 55-60%, normal wall motion, normal diastolic function, normal RV function  LHC 10/29/14 LAD: Proximal-mid 80% EF 50-55% with small focal region of mild mid anterolateral hypokinesis  PCI: 3 x 15 mm resolute DES to the LAD  Past Medical History:  Diagnosis Date  . Aortic insufficiency   . CAD (coronary artery disease)    a.  Non-STEMI 8/16: LHC-proximal LAD 80% treated with a resolute DES, EF 50-55%;  b.  Echo 8/16:  Moderate LVH, EF 55-60%, normal wall motion, normal diastolic function, normal RV function  . CHF (congestive heart failure) ([place])   . Dyspnea    mild  . GERD (gastroesophageal reflux disease)   . Headache    "maybe monthly" (10/28/2014)  . HLD (hyperlipidemia)   . Hypertension   . NSTEMI (non-ST elevated myocardial infarction) ([place]) 10/28/2014  . OSA (obstructive sleep apnea)    "tried mask; wear it off and on" (10/28/2014)  . S/P aortic valve repair 09/15/2018   Complex valvuloplasty including plication of prolapsing right coronary cusp with Biostable HAART 300 aortic annuloplasty, size 21 mm   Surgical Hx: The patient  has a past surgical history that includes Arm amputation through forearm (Left, 2009); Tympanostomy tube placement (Bilateral, "as a kid"); Cholesteatoma excision (Right, 1990's); Cardiac catheterization (N/A, 10/29/2014); LEFT HEART CATH AND CORONARY ANGIOGRAPHY (N/A, 05/25/2016); Wisdom tooth extraction; Nasal septoplasty w/ turbinoplasty (Bilateral, 11/30/2017); TEE without cardioversion (N/A, 06/08/2018); RIGHT/LEFT HEART CATH AND CORONARY ANGIOGRAPHY (N/A, 08/11/2018); Multiple tooth extractions (08/29/2018); Aortic valve repair (N/A, 09/15/2018); and TEE without cardioversion (N/A,  09/15/2018).   Current Medications: Current Meds  Medication Sig  . aspirin EC 81 MG tablet Take 81 mg by mouth daily.  Marland Kitchen atorvastatin (LIPITOR) 80 MG tablet Take 1 tablet (80 mg total) by mouth daily at 6 PM.  . calcium carbonate (TUMS - DOSED IN MG ELEMENTAL CALCIUM) 500 MG chewable tablet Chew 2 tablets by mouth daily as needed for indigestion or heartburn.  . fluticasone (FLONASE) 50 MCG/ACT nasal spray Place 1 spray into both nostrils daily as needed for  allergies or rhinitis.  Marland Kitchen gabapentin (NEURONTIN) 100 MG capsule Take 2 capsules (200 mg total) by mouth at bedtime.  Marland Kitchen loratadine (CLARITIN) 10 MG tablet Take 10 mg by mouth daily.   . metoprolol tartrate (LOPRESSOR) 25 MG tablet Take 1 tablet (25 mg total) by mouth 2 (two) times daily.  . Multiple Vitamin (MULTI-VITAMIN PO) Take 1 tablet by mouth daily.  . nitroGLYCERIN (NITROSTAT) 0.4 MG SL tablet Place 1 tablet (0.4 mg total) under the tongue every 5 (five) minutes x 3 doses as needed for chest pain.  Marland Kitchen oxyCODONE-acetaminophen (PERCOCET/ROXICET) 5-325 MG tablet Take 1 tablet by mouth at bedtime as needed for severe pain.  . pantoprazole (PROTONIX) 40 MG tablet Take 1 tablet (40 mg total) by mouth 2 (two) times daily.  . traMADol (ULTRAM) 50 MG tablet Take 50 mg by mouth every 6 (six) hours as needed.     Allergies:   Morphine and related   Social History   Tobacco Use  . Smoking status: Current Some Day Smoker    Packs/day: 0.33    Years: 32.00    Pack years: 10.56    Types: Cigarettes  . Smokeless tobacco: Never Used  . Tobacco comment: 1-2 cigarettes in couple of days  Substance Use Topics  . Alcohol use: No    Alcohol/week: 2.0 - 3.0 standard drinks    Types: 2 - 3 Standard drinks or equivalent per week    Frequency: Never    Comment: stopped 06/19/2015,   . Drug use: Not Currently    Types: Marijuana    Comment: 05/24/2016  "stopped marijuana in the early 2000's"     Family Hx: The patient's family history includes Bradycardia in his father; Diverticulitis in his mother; Healthy in his father; Multiple sclerosis in his maternal grandfather; Skin cancer in his mother. There is no history of Colon cancer, Stomach cancer, Rectal cancer, Liver cancer, or Esophageal cancer.  ROS:   Please see the history of present illness.    ROS All other systems reviewed and are negative.   EKGs/Labs/Other Test Reviewed:    EKG:  EKG is  ordered today.  The ekg ordered today  demonstrates normal sinus rhythm, HR 83, normal axis, QTc 455, no acute ST-TW changes, no change from piror tracing  Recent Labs: 06/07/2018: TSH 1.414 09/08/2018: ALT 19; NT-Pro BNP 127 09/16/2018: Magnesium 2.5 09/18/2018: BUN 14; Creatinine, Ser 1.02; Hemoglobin 12.2; Platelets 133; Potassium 4.1; Sodium 137   Recent Lipid Panel Lab Results  Component Value Date/Time   CHOL 124 09/21/2017 10:05 AM   TRIG 151.0 (H) 09/21/2017 10:05 AM   HDL 35.90 (L) 09/21/2017 10:05 AM   CHOLHDL 3 09/21/2017 10:05 AM   LDLCALC 58 09/21/2017 10:05 AM    Physical Exam:    VS:  BP 124/86   Pulse 67   Ht _0  (1.803 m)   Wt 202 lb 12.8 oz (92 kg)   SpO2 96%   BMI 28.28 kg/m  Wt Readings from Last 3 Encounters:  10/03/18 202 lb 12.8 oz (92 kg)  09/18/18 202 lb 14.4 oz (92 kg)  09/13/18 208 lb 3.2 oz (94.4 kg)     Physical Exam  Constitutional: He is oriented to person, place, and time. He appears well-developed and well-nourished. No distress.  HENT:  Head: Normocephalic and atraumatic.  Eyes: No scleral icterus.  Neck: No JVD present. No thyromegaly present.  Cardiovascular: Normal rate, regular rhythm, S1 normal, S2 normal and normal heart sounds.  No murmur heard. Pulmonary/Chest: Effort normal and breath sounds normal. He has no wheezes. He has no rales.  Median sternotomy well healed without erythema or discharge; chest tube wound on L not apposed with granulation tissue noted, no discharge or erythema  Abdominal: Soft. There is no hepatomegaly.  Musculoskeletal:        General: No edema.  Lymphadenopathy:    He has no cervical adenopathy.  Neurological: He is alert and oriented to person, place, and time.  Skin: Skin is warm and dry.  Psychiatric: He has a normal mood and affect.    ASSESSMENT & PLAN:    1. Severe aortic insufficiency 2. S/P aortic valve repair He is progressing slowly after his recent aortic valve repair.  He is concerned about this L chest tube wound.   It looks ok.  It has good granulation tissue and no signs of infection.  I asked him to contact Dr. Roxy Manns if he starts to see pustular drainage or surrounding erythema.  I asked him to drink Ensure or Boost if he is not able to eat much.  I have encouraged him to use Tylenol if possible instead of Tramadol or Oxycodone.  He can also use Melatonin for sleep.  He knows that he will need SBE prophylaxis in the future.  I will arrange a follow up Echo in 6-8 weeks.  He can follow up with me or Dr. Radford Pax in 3 mos.    3. Coronary artery disease involving native coronary artery of native heart without angina pectoris S/p NSTEMI in 2016 treated with a DES to the LAD.  Cardiac Catheterization prior to his valve surgery demonstrated a patent LAD stent.  Continue ASA, statin.   4. Essential hypertension The patient's blood pressure is controlled on his current regimen.  Continue current therapy.    5. Hyperlipidemia, unspecified hyperlipidemia type Continue statin Rx.    Dispo:  Return in about 3 months (around 01/03/2019) for Routine Follow Up w/ Dr. Radford Pax, or Richardson Dopp, PA-C.   Medication Adjustments/Labs and Tests Ordered: Current medicines are reviewed at length with the patient today.  Concerns regarding medicines are outlined above.  Tests Ordered: Orders Placed This Encounter  Procedures  . EKG 12-Lead  . ECHOCARDIOGRAM COMPLETE   Medication Changes: No orders of the defined types were placed in this encounter.   Signed, Richardson Dopp, PA-C  10/03/2018 2:41 PM    [facility] [address], [place], [place]  [address] Phone: [phone]; Fax: [phone]

## 2018-10-05 ENCOUNTER — Other Ambulatory Visit: Payer: Self-pay | Admitting: Thoracic Surgery (Cardiothoracic Vascular Surgery)

## 2018-10-05 DIAGNOSIS — I25119 Atherosclerotic heart disease of native coronary artery with unspecified angina pectoris: Secondary | ICD-10-CM

## 2018-10-05 NOTE — Progress Notes (Signed)
cxr 

## 2018-10-06 ENCOUNTER — Telehealth (HOSPITAL_COMMUNITY): Payer: Self-pay

## 2018-10-06 NOTE — Telephone Encounter (Signed)
Called patient to see if he is interested in the Cardiac Rehab Program. Patient expressed interest. Explained scheduling process and went over insurance, patient verbalized understanding. Will contact patient for scheduling once f/u has been completed.  °

## 2018-10-06 NOTE — Telephone Encounter (Signed)
Pt insurance is active and benefits verified through [facility]. Co-pay $0.00, DED $300.00/$300.00 met, out of pocket $7,000.00/$7,000.00 met, co-insurance 0%. No pre-authorization required. Jamyria/[facility], 10/06/2018 @ 251PM, PVX#[id number]

## 2018-10-12 MED ORDER — METOPROLOL TARTRATE 25 MG PO TABS: 25.0000 mg | ORAL_TABLET | Freq: Two times a day (BID) | ORAL | 2 refills | Status: DC

## 2018-10-16 ENCOUNTER — Other Ambulatory Visit: Payer: Self-pay

## 2018-10-16 ENCOUNTER — Other Ambulatory Visit: Payer: Self-pay | Admitting: *Deleted

## 2018-10-16 ENCOUNTER — Ambulatory Visit
Admission: RE | Admit: 2018-10-16 | Discharge: 2018-10-16 | Disposition: A | Discharge status: Home / Self Care | Payer: 59 | Source: Ambulatory Visit | Attending: Thoracic Surgery (Cardiothoracic Vascular Surgery) | Admitting: Thoracic Surgery (Cardiothoracic Vascular Surgery)

## 2018-10-16 ENCOUNTER — Ambulatory Visit (INDEPENDENT_AMBULATORY_CARE_PROVIDER_SITE_OTHER): Payer: Self-pay | Admitting: Thoracic Surgery (Cardiothoracic Vascular Surgery)

## 2018-10-16 ENCOUNTER — Encounter: Payer: Self-pay | Admitting: Thoracic Surgery (Cardiothoracic Vascular Surgery)

## 2018-10-16 VITALS — BP 127/84 | HR 84 | Temp 97.5°F | Resp 16 | Ht 71.0 in | Wt 205.0 lb

## 2018-10-16 DIAGNOSIS — G8918 Other acute postprocedural pain: Secondary | ICD-10-CM

## 2018-10-16 DIAGNOSIS — Z952 Presence of prosthetic heart valve: Secondary | ICD-10-CM

## 2018-10-16 DIAGNOSIS — I25119 Atherosclerotic heart disease of native coronary artery with unspecified angina pectoris: Secondary | ICD-10-CM

## 2018-10-16 DIAGNOSIS — Z9889 Other specified postprocedural states: Secondary | ICD-10-CM

## 2018-10-16 DIAGNOSIS — J9811 Atelectasis: Secondary | ICD-10-CM | POA: Diagnosis not present

## 2018-10-16 DIAGNOSIS — I351 Nonrheumatic aortic (valve) insufficiency: Secondary | ICD-10-CM

## 2018-10-16 MED ORDER — TRAMADOL HCL 50 MG PO TABS: 50.0000 mg | ORAL_TABLET | Freq: Four times a day (QID) | ORAL | 0 refills | Status: AC | PRN

## 2018-10-16 MED ORDER — TRAMADOL HCL 50 MG PO TABS: 50.0000 mg | ORAL_TABLET | Freq: Four times a day (QID) | ORAL | 0 refills | Status: DC | PRN

## 2018-10-16 NOTE — Patient Instructions (Signed)
Continue all previous medications without any changes at this time  Continue to avoid any heavy lifting or strenuous use of your arms or shoulders for at least a total of three months from the time of surgery.  After three months you may gradually increase how much you lift or otherwise use your arms or chest as tolerated, with limits based upon whether or not activities lead to the return of significant discomfort.  You may return to driving an automobile as long as you are no longer requiring oral narcotic pain relievers during the daytime.  It would be wise to start driving only short distances during the daylight and gradually increase from there as you feel comfortable.  Endocarditis is a potentially serious infection of heart valves or inside lining of the heart.  It occurs more commonly in patients with diseased heart valves (such as patient's with aortic or mitral valve disease) and in patients who have undergone heart valve repair or replacement.  Certain surgical and dental procedures may put you at risk, such as dental cleaning, other dental procedures, or any surgery involving the respiratory, urinary, gastrointestinal tract, gallbladder or prostate gland.   To minimize your chances for develooping endocarditis, maintain good oral health and seek prompt medical attention for any infections involving the mouth, teeth, gums, skin or urinary tract.    Always notify your doctor or dentist about your underlying heart valve condition before having any invasive procedures. You will need to take antibiotics before certain procedures, including all routine dental cleanings or other dental procedures.  Your cardiologist or dentist should prescribe these antibiotics for you to be taken ahead of time.      

## 2018-10-16 NOTE — Progress Notes (Signed)
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  The patient's early postoperative recovery was uneventful and he was discharged from the hospital on the third postoperative day.  Since hospital discharge she has been seen in follow-up in our office on September 28, 2018 and more recently by Richardson Dopp at [facility] on October 03, 2018.  He returns to our office today for routine follow-up and reports that he is doing well.  He still has mild soreness in his chest.  He has not been taking any sort of pain relievers other than tramadol occasionally, and he has not had any tramadol for several days.  He uses Tylenol primarily.  He denies any shortness of breath.  Appetite is good.  He is eager to start increasing his activity.  He is still having some trouble sleeping at night.  Current Outpatient Medications  Medication Sig Dispense Refill  . aspirin EC 81 MG tablet Take 81 mg by mouth daily.    Marland Kitchen atorvastatin (LIPITOR) 80 MG tablet Take 1 tablet (80 mg total) by mouth daily at 6 PM. 90 tablet 3  . calcium carbonate (TUMS - DOSED IN MG ELEMENTAL CALCIUM) 500 MG chewable tablet Chew 2 tablets by mouth daily as needed for indigestion or heartburn.    . fluticasone (FLONASE) 50 MCG/ACT nasal spray Place 1 spray into both nostrils daily as needed for allergies or rhinitis.    Marland Kitchen  gabapentin (NEURONTIN) 100 MG capsule Take 2 capsules (200 mg total) by mouth at bedtime. 180 capsule 1  . loratadine (CLARITIN) 10 MG tablet Take 10 mg by mouth daily.     . metoprolol tartrate (LOPRESSOR) 25 MG tablet Take 1 tablet (25 mg total) by mouth 2 (two) times daily. 180 tablet 2  . Multiple Vitamin (MULTI-VITAMIN PO) Take 1 tablet by mouth daily.    . nitroGLYCERIN (NITROSTAT) 0.4 MG SL tablet Place 1 tablet (0.4 mg total) under the tongue every 5 (five) minutes x 3 doses as needed for chest pain. 25 tablet 12  . pantoprazole (PROTONIX) 40 MG tablet Take 1 tablet (40 mg total) by mouth 2 (two) times daily. 180 tablet 0   No current facility-administered medications for this visit.       Physical Exam:   BP 127/84 (BP Location: Right Arm, Patient Position: Sitting, Cuff Size: Normal)   Pulse 84   Temp (!) 97.5 F (36.4 C) Comment: THERMAL  Resp 16   Ht 5\' 11"  (1.803 m)   Wt 205 lb (93 kg)   SpO2 96% Comment: RA  BMI 28.59 kg/m   General:  Well-appearing  Chest:   Clear to auscultation  CV:   Regular rate and rhythm without murmur  Incisions:  Healing nicely, sternum is stable  Abdomen:  Soft nontender  Extremities:  Warm and well-perfused  Diagnostic Tests:  CHEST - 2 VIEW  COMPARISON:  09/18/2018  FINDINGS: Normal heart size post AVR.  Mediastinal contours and pulmonary vascularity normal.  Improved bibasilar atelectasis.  Lungs otherwise clear.  Tiny pleural effusions blunt the posterior costophrenic angles.  No pneumothorax.  Probable LEFT nipple shadow, not on previous exam.  IMPRESSION: Improved bibasilar atelectasis.   Electronically Signed   By: Lavonia Dana M.D.   On: 10/16/2018 09:41    Impression:  Patient is doing very well approximately 1 month status post aortic valve repair  Plan:  I have encouraged the patient to continue to gradually increase his physical activity as tolerated with his primary limitation remaining  that he refrain from heavy lifting or strenuous use of his arms or shoulders for at least another 2 months.  He may resume driving an automobile.  We discussed timing for return to work.  I have encouraged him to enroll and participate in the cardiac rehab program.  We have not recommended any changes to his current medications.  The patient will return to our office for routine follow-up in approximately 2 months at which time we will review the results of the follow-up echocardiogram which has been scheduled by Richardson Dopp.  All questions answered.    Valentina Gu. Roxy Manns, MD 10/16/2018 9:53 AM

## 2018-10-17 ENCOUNTER — Telehealth (HOSPITAL_COMMUNITY): Payer: Self-pay

## 2018-10-23 ENCOUNTER — Telehealth (HOSPITAL_COMMUNITY): Payer: Self-pay | Admitting: Pharmacist

## 2018-10-23 NOTE — Telephone Encounter (Signed)
Cardiac Rehab Medication Review by a Pharmacist  Does the patient  feel that his/her medications are working for him/her?  yes  Has the patient been experiencing any side effects to the medications prescribed?  No  Does the patient measure his/her own blood pressure or blood glucose at home?  yes   Average BP 125/85  Does the patient have any problems obtaining medications due to transportation or finances?   no  Understanding of regimen: excellent Understanding of indications: excellent Potential of compliance: excellent   Pharmacist comments: N/A  Lorel Monaco, PharmD PGY1 Ambulatory Care Resident [facility] # [phone]

## 2018-10-25 ENCOUNTER — Telehealth (HOSPITAL_COMMUNITY): Payer: Self-pay | Admitting: *Deleted

## 2018-10-26 ENCOUNTER — Encounter (HOSPITAL_COMMUNITY)
Admission: RE | Admit: 2018-10-26 | Discharge: 2018-10-26 | Disposition: A | Discharge status: Home / Self Care | Payer: 59 | Source: Ambulatory Visit | Attending: Cardiology | Admitting: Cardiology

## 2018-10-26 ENCOUNTER — Other Ambulatory Visit: Payer: Self-pay

## 2018-10-26 ENCOUNTER — Encounter (HOSPITAL_COMMUNITY): Payer: Self-pay

## 2018-10-26 VITALS — Ht 72.0 in | Wt 207.0 lb

## 2018-10-26 DIAGNOSIS — Z9889 Other specified postprocedural states: Secondary | ICD-10-CM

## 2018-10-26 NOTE — Progress Notes (Signed)
Cardiac Individual Treatment Plan  Patient Details  Name: Isaac Patel MRN: [id number] Date of Birth: November 17, 1965 Referring Provider:     CARDIAC REHAB PHASE II ORIENTATION from 10/26/2018 in [place]  Referring Provider  [name], MD      Initial Encounter Date:    CARDIAC REHAB PHASE II ORIENTATION from 10/26/2018 in [place]  Date  10/26/18      Visit Diagnosis: S/P aortic valve repair  Patient's Home Medications on Admission:  Current Outpatient Medications:  .  aspirin EC 81 MG tablet, Take 81 mg by mouth daily., Disp: , Rfl:  .  atorvastatin (LIPITOR) 80 MG tablet, Take 1 tablet (80 mg total) by mouth daily at 6 PM., Disp: 90 tablet, Rfl: 3 .  calcium carbonate (TUMS - DOSED IN MG ELEMENTAL CALCIUM) 500 MG chewable tablet, Chew 2 tablets by mouth daily as needed for indigestion or heartburn., Disp: , Rfl:  .  fluticasone (FLONASE) 50 MCG/ACT nasal spray, Place 1 spray into both nostrils daily as needed for allergies or rhinitis., Disp: , Rfl:  .  gabapentin (NEURONTIN) 100 MG capsule, Take 2 capsules (200 mg total) by mouth at bedtime., Disp: 180 capsule, Rfl: 1 .  loratadine (CLARITIN) 10 MG tablet, Take 10 mg by mouth daily. , Disp: , Rfl:  .  metoprolol tartrate (LOPRESSOR) 25 MG tablet, Take 1 tablet (25 mg total) by mouth 2 (two) times daily., Disp: 180 tablet, Rfl: 2 .  Multiple Vitamin (MULTI-VITAMIN PO), Take 1 tablet by mouth daily., Disp: , Rfl:  .  nitroGLYCERIN (NITROSTAT) 0.4 MG SL tablet, Place 1 tablet (0.4 mg total) under the tongue every 5 (five) minutes x 3 doses as needed for chest pain., Disp: 25 tablet, Rfl: 12 .  pantoprazole (PROTONIX) 40 MG tablet, Take 1 tablet (40 mg total) by mouth 2 (two) times daily., Disp: 180 tablet, Rfl: 0  Past Medical History: Past Medical History:  Diagnosis Date  . Aortic insufficiency   . CAD (coronary artery disease)    a.  Non-STEMI 8/16:  LHC-proximal LAD 80% treated with a resolute DES, EF 50-55%;  b.  Echo 8/16:  Moderate LVH, EF 55-60%, normal wall motion, normal diastolic function, normal RV function  . CHF (congestive heart failure) ([place])   . Dyspnea    mild  . GERD (gastroesophageal reflux disease)   . Headache    "maybe monthly" (10/28/2014)  . HLD (hyperlipidemia)   . Hypertension   . NSTEMI (non-ST elevated myocardial infarction) ([place]) 10/28/2014  . OSA (obstructive sleep apnea)    "tried mask; wear it off and on" (10/28/2014)  . S/P aortic valve repair 09/15/2018   Complex valvuloplasty including plication of prolapsing right coronary cusp with Biostable HAART 300 aortic annuloplasty, size 21 mm    Tobacco Use: Social History   Tobacco Use  Smoking Status Current Some Day Smoker  . Packs/day: 0.33  . Years: 32.00  . Pack years: 10.56  . Types: Cigarettes  Smokeless Tobacco Never Used  Tobacco Comment   1-2 cigarettes in couple of days    Labs: Recent Review Flowsheet Data    Labs for ITP Cardiac and Pulmonary Rehab Latest Ref Rng & Units 09/15/2018 09/15/2018 09/15/2018 09/15/2018 09/15/2018   Cholestrol 0 - 200 mg/dL - - - - -   LDLCALC 0 - 99 mg/dL - - - - -   HDL >39.00 mg/dL - - - - -   Trlycerides 0.0 -  149.0 mg/dL - - - - -   Hemoglobin A1c 4.8 - 5.6 % - - - - -   PHART 7.350 - 7.450 7.396 7.386 7.398 7.406 -   PCO2ART 32.0 - 48.0 mmHg 39.8 37.6 29.1(L) 35.4 -   HCO3 20.0 - 28.0 mmol/L 24.4 23.1 18.0(L) 22.3 -   TCO2 22 - 32 mmol/L 26 24 19(L) 23 20(L)   ACIDBASEDEF 0.0 - 2.0 mmol/L - 2.0 6.0(H) 2.0 -   O2SAT % 98.0 97.0 96.0 96.0 -      Capillary Blood Glucose: Lab Results  Component Value Date   GLUCAP 105 (H) 09/17/2018   GLUCAP 146 (H) 09/17/2018   GLUCAP 84 09/17/2018   GLUCAP 102 (H) 09/16/2018   GLUCAP 156 (H) 09/16/2018     Exercise Target Goals: Exercise Program Goal: Individual exercise prescription set using results from initial 6 min walk test and THRR while considering   patient's activity barriers and safety.   Exercise Prescription Goal: Initial exercise prescription builds to 30-45 minutes a day of aerobic activity, 2-3 days per week.  Home exercise guidelines will be given to patient during program as part of exercise prescription that the participant will acknowledge.  Activity Barriers & Risk Stratification: Activity Barriers & Cardiac Risk Stratification - 10/26/18 1054      Activity Barriers & Cardiac Risk Stratification   Activity Barriers  Other (comment)    Comments  Amputation of Left Upper Extremity    Cardiac Risk Stratification  High       6 Minute Walk: 6 Minute Walk    Row Name 10/26/18 1053         6 Minute Walk   Phase  Initial     Distance  1722 feet     Walk Time  6 minutes     # of Rest Breaks  0     MPH  3.26     METS  4.73     RPE  11     Perceived Dyspnea   0     VO2 Peak  16.54     Symptoms  No     Resting HR  78 bpm     Resting BP  122/80     Resting Oxygen Saturation   97 %     Exercise Oxygen Saturation  during 6 min walk  99 %     Max Ex. HR  115 bpm     Max Ex. BP  128/70     2 Minute Post BP  108/66        Oxygen Initial Assessment:   Oxygen Re-Evaluation:   Oxygen Discharge (Final Oxygen Re-Evaluation):   Initial Exercise Prescription: Initial Exercise Prescription - 10/26/18 1100      Date of Initial Exercise RX and Referring Provider   Date  10/26/18    Referring Provider  [name], MD    Expected Discharge Date  12/08/18      Treadmill   MPH  3    Grade  2    Minutes  15    METs  4.12      NuStep   Level  3    SPM  85    Minutes  15    METs  3.5      Prescription Details   Frequency (times per week)  3x    Duration  Progress to 30 minutes of continuous aerobic without signs/symptoms of physical distress      Intensity  THRR 40-80% of Max Heartrate  67-134    Ratings of Perceived Exertion  11-13    Perceived Dyspnea  0-4      Progression   Progression  Continue  progressive overload as per policy without signs/symptoms or physical distress.      Resistance Training   Training Prescription  Yes    Weight  5    Reps  10-15       Perform Capillary Blood Glucose checks as needed.  Exercise Prescription Changes:   Exercise Comments:   Exercise Goals and Review: Exercise Goals    Row Name 10/26/18 1100             Exercise Goals   Increase Physical Activity  Yes       Intervention  Provide advice, education, support and counseling about physical activity/exercise needs.;Develop an individualized exercise prescription for aerobic and resistive training based on initial evaluation findings, risk stratification, comorbidities and participant's personal goals.       Expected Outcomes  Short Term: Attend rehab on a regular basis to increase amount of physical activity.;Long Term: Add in home exercise to make exercise part of routine and to increase amount of physical activity.;Long Term: Exercising regularly at least 3-5 days a week.       Increase Strength and Stamina  Yes       Intervention  Provide advice, education, support and counseling about physical activity/exercise needs.;Develop an individualized exercise prescription for aerobic and resistive training based on initial evaluation findings, risk stratification, comorbidities and participant's personal goals.       Expected Outcomes  Short Term: Increase workloads from initial exercise prescription for resistance, speed, and METs.;Short Term: Perform resistance training exercises routinely during rehab and add in resistance training at home;Long Term: Improve cardiorespiratory fitness, muscular endurance and strength as measured by increased METs and functional capacity (6MWT)       Able to understand and use rate of perceived exertion (RPE) scale  Yes       Intervention  Provide education and explanation on how to use RPE scale       Expected Outcomes  Short Term: Able to use RPE daily in  rehab to express subjective intensity level;Long Term:  Able to use RPE to guide intensity level when exercising independently       Knowledge and understanding of Target Heart Rate Range (THRR)  Yes       Intervention  Provide education and explanation of THRR including how the numbers were predicted and where they are located for reference       Expected Outcomes  Short Term: Able to state/look up THRR;Long Term: Able to use THRR to govern intensity when exercising independently;Short Term: Able to use daily as guideline for intensity in rehab       Able to check pulse independently  Yes       Intervention  Provide education and demonstration on how to check pulse in carotid and radial arteries.;Review the importance of being able to check your own pulse for safety during independent exercise       Expected Outcomes  Short Term: Able to explain why pulse checking is important during independent exercise;Long Term: Able to check pulse independently and accurately       Understanding of Exercise Prescription  Yes       Intervention  Provide education, explanation, and written materials on patient's individual exercise prescription       Expected Outcomes  Short Term: Able  to explain program exercise prescription;Long Term: Able to explain home exercise prescription to exercise independently          Exercise Goals Re-Evaluation :   Discharge Exercise Prescription (Final Exercise Prescription Changes):   Nutrition:  Target Goals: Understanding of nutrition guidelines, daily intake of sodium 1500mg , cholesterol 200mg , calories 30% from fat and 7% or less from saturated fats, daily to have 5 or more servings of fruits and vegetables.  Biometrics: Pre Biometrics - 10/26/18 1059      Pre Biometrics   Height  6' (1.829 m)    Weight  93.9 kg    Waist Circumference  40.5 inches    Hip Circumference  41.5 inches    Waist to Hip Ratio  0.98 %    BMI (Calculated)  28.07    Triceps Skinfold  32  mm    % Body Fat  30.2 %    Grip Strength  46 kg    Flexibility  11 in    Single Leg Stand  30 seconds        Nutrition Therapy Plan and Nutrition Goals:   Nutrition Assessments:   Nutrition Goals Re-Evaluation:   Nutrition Goals Re-Evaluation:   Nutrition Goals Discharge (Final Nutrition Goals Re-Evaluation):   Psychosocial: Target Goals: Acknowledge presence or absence of significant depression and/or stress, maximize coping skills, provide positive support system. Participant is able to verbalize types and ability to use techniques and skills needed for reducing stress and depression.  Initial Review & Psychosocial Screening: Initial Psych Review & Screening - 10/26/18 1117      Initial Review   Current issues with  Current Sleep Concerns      Family Dynamics   Good Support System?  Yes   Ken's wife is supportive of him.     Barriers   Psychosocial barriers to participate in program  The patient should benefit from training in stress management and relaxation.      Screening Interventions   Interventions  Encouraged to exercise       Quality of Life Scores: Quality of Life - 10/26/18 1116      Quality of Life   Select  Quality of Life      Quality of Life Scores   Health/Function Pre  19.9 %    Socioeconomic Pre  21.29 %    Psych/Spiritual Pre  20.64 %    Family Pre  22.2 %    GLOBAL Pre  20.68 %      Scores of 19 and below usually indicate a poorer quality of life in these areas.  A difference of  2-3 points is a clinically meaningful difference.  A difference of 2-3 points in the total score of the Quality of Life Index has been associated with significant improvement in overall quality of life, self-image, physical symptoms, and general health in studies assessing change in quality of life.  PHQ-9: Recent Review Flowsheet Data    Depression screen [facility] 2/9 03/03/2015 12/09/2014   Decreased Interest 0 0   Down, Depressed, Hopeless 0 0   PHQ - 2  Score 0 0     Interpretation of Total Score  Total Score Depression Severity:  1-4 = Minimal depression, 5-9 = Mild depression, 10-14 = Moderate depression, 15-19 = Moderately severe depression, 20-27 = Severe depression   Psychosocial Evaluation and Intervention:   Psychosocial Re-Evaluation:   Psychosocial Discharge (Final Psychosocial Re-Evaluation):   Vocational Rehabilitation: Provide vocational rehab assistance to qualifying candidates.  Vocational Rehab Evaluation & Intervention: Vocational Rehab - 10/26/18 1109      Initial Vocational Rehab Evaluation & Intervention   Assessment shows need for Vocational Rehabilitation  No       Education: Education Goals: Education classes will be provided on a weekly basis, covering required topics. Participant will state understanding/return demonstration of topics presented.  Learning Barriers/Preferences: Learning Barriers/Preferences - 10/26/18 1113      Learning Barriers/Preferences   Learning Barriers  Sight    Learning Preferences  Written Material;Verbal Instruction;Skilled Demonstration       Education Topics: Count Your Pulse:  -Group instruction provided by verbal instruction, demonstration, patient participation and written materials to support subject.  Instructors address importance of being able to find your pulse and how to count your pulse when at home without a heart monitor.  Patients get hands on experience counting their pulse with staff help and individually.   Heart Attack, Angina, and Risk Factor Modification:  -Group instruction provided by verbal instruction, video, and written materials to support subject.  Instructors address signs and symptoms of angina and heart attacks.    Also discuss risk factors for heart disease and how to make changes to improve heart health risk factors.   Functional Fitness:  -Group instruction provided by verbal instruction, demonstration, patient participation, and  written materials to support subject.  Instructors address safety measures for doing things around the house.  Discuss how to get up and down off the floor, how to pick things up properly, how to safely get out of a chair without assistance, and balance training.   Meditation and Mindfulness:  -Group instruction provided by verbal instruction, patient participation, and written materials to support subject.  Instructor addresses importance of mindfulness and meditation practice to help reduce stress and improve awareness.  Instructor also leads participants through a meditation exercise.    Stretching for Flexibility and Mobility:  -Group instruction provided by verbal instruction, patient participation, and written materials to support subject.  Instructors lead participants through series of stretches that are designed to increase flexibility thus improving mobility.  These stretches are additional exercise for major muscle groups that are typically performed during regular warm up and cool down.   Hands Only CPR:  -Group verbal, video, and participation provides a basic overview of AHA guidelines for community CPR. Role-play of emergencies allow participants the opportunity to practice calling for help and chest compression technique with discussion of AED use.   Hypertension: -Group verbal and written instruction that provides a basic overview of hypertension including the most recent diagnostic guidelines, risk factor reduction with self-care instructions and medication management.    Nutrition I class: Heart Healthy Eating:  -Group instruction provided by PowerPoint slides, verbal discussion, and written materials to support subject matter. The instructor gives an explanation and review of the Therapeutic Lifestyle Changes diet recommendations, which includes a discussion on lipid goals, dietary fat, sodium, fiber, plant stanol/sterol esters, sugar, and the components of a well-balanced,  healthy diet.   Nutrition II class: Lifestyle Skills:  -Group instruction provided by PowerPoint slides, verbal discussion, and written materials to support subject matter. The instructor gives an explanation and review of label reading, grocery shopping for heart health, heart healthy recipe modifications, and ways to make healthier choices when eating out.   Diabetes Question & Answer:  -Group instruction provided by PowerPoint slides, verbal discussion, and written materials to support subject matter. The instructor gives an explanation and review of diabetes co-morbidities, pre- and post-prandial  blood glucose goals, pre-exercise blood glucose goals, signs, symptoms, and treatment of hypoglycemia and hyperglycemia, and foot care basics.   Diabetes Blitz:  -Group instruction provided by PowerPoint slides, verbal discussion, and written materials to support subject matter. The instructor gives an explanation and review of the physiology behind type 1 and type 2 diabetes, diabetes medications and rational behind using different medications, pre- and post-prandial blood glucose recommendations and Hemoglobin A1c goals, diabetes diet, and exercise including blood glucose guidelines for exercising safely.    Portion Distortion:  -Group instruction provided by PowerPoint slides, verbal discussion, written materials, and food models to support subject matter. The instructor gives an explanation of serving size versus portion size, changes in portions sizes over the last 20 years, and what consists of a serving from each food group.   Stress Management:  -Group instruction provided by verbal instruction, video, and written materials to support subject matter.  Instructors review role of stress in heart disease and how to cope with stress positively.     Exercising on Your Own:  -Group instruction provided by verbal instruction, power point, and written materials to support subject.  Instructors  discuss benefits of exercise, components of exercise, frequency and intensity of exercise, and end points for exercise.  Also discuss use of nitroglycerin and activating EMS.  Review options of places to exercise outside of rehab.  Review guidelines for sex with heart disease.   Cardiac Drugs I:  -Group instruction provided by verbal instruction and written materials to support subject.  Instructor reviews cardiac drug classes: antiplatelets, anticoagulants, beta blockers, and statins.  Instructor discusses reasons, side effects, and lifestyle considerations for each drug class.   Cardiac Drugs II:  -Group instruction provided by verbal instruction and written materials to support subject.  Instructor reviews cardiac drug classes: angiotensin converting enzyme inhibitors (ACE-I), angiotensin II receptor blockers (ARBs), nitrates, and calcium channel blockers.  Instructor discusses reasons, side effects, and lifestyle considerations for each drug class.   Anatomy and Physiology of the Circulatory System:  Group verbal and written instruction and models provide basic cardiac anatomy and physiology, with the coronary electrical and arterial systems. Review of: AMI, Angina, Valve disease, Heart Failure, Peripheral Artery Disease, Cardiac Arrhythmia, Pacemakers, and the ICD.   Other Education:  -Group or individual verbal, written, or video instructions that support the educational goals of the cardiac rehab program.   Holiday Eating Survival Tips:  -Group instruction provided by PowerPoint slides, verbal discussion, and written materials to support subject matter. The instructor gives patients tips, tricks, and techniques to help them not only survive but enjoy the holidays despite the onslaught of food that accompanies the holidays.   Knowledge Questionnaire Score: Knowledge Questionnaire Score - 10/26/18 1109      Knowledge Questionnaire Score   Pre Score  24/24       Core  Components/Risk Factors/Patient Goals at Admission: Personal Goals and Risk Factors at Admission - 10/26/18 1114      Core Components/Risk Factors/Patient Goals on Admission    Weight Management  Weight Loss    Hypertension  Yes    Intervention  Provide education on lifestyle modifcations including regular physical activity/exercise, weight management, moderate sodium restriction and increased consumption of fresh fruit, vegetables, and low fat dairy, alcohol moderation, and smoking cessation.;Monitor prescription use compliance.    Expected Outcomes  Short Term: Continued assessment and intervention until BP is < 140/8mm HG in hypertensive participants. < 130/30mm HG in hypertensive participants with diabetes, heart failure or  chronic kidney disease.;Long Term: Maintenance of blood pressure at goal levels.    Lipids  Yes    Intervention  Provide education and support for participant on nutrition & aerobic/resistive exercise along with prescribed medications to achieve LDL 70mg , HDL >40mg .    Expected Outcomes  Short Term: Participant states understanding of desired cholesterol values and is compliant with medications prescribed. Participant is following exercise prescription and nutrition guidelines.;Long Term: Cholesterol controlled with medications as prescribed, with individualized exercise RX and with personalized nutrition plan. Value goals: LDL < 70mg , HDL > 40 mg.       Core Components/Risk Factors/Patient Goals Review:    Core Components/Risk Factors/Patient Goals at Discharge (Final Review):    ITP Comments: ITP Comments    Row Name 10/26/18 1004           ITP Comments  Dr. [name], Medical Director          Comments: Patient attended orientation on 10/26/2018 to review rules and guidelines for program.  Completed 6 minute walk test, Intitial ITP, and exercise prescription.  VSS. Telemetry-SR.  Asymptomatic. Safety measures and social distancing in place per CDC  guidelines.

## 2018-10-30 ENCOUNTER — Encounter (HOSPITAL_COMMUNITY)
Admission: RE | Admit: 2018-10-30 | Discharge: 2018-10-30 | Disposition: A | Discharge status: Home / Self Care | Payer: 59 | Source: Ambulatory Visit | Attending: Cardiology | Admitting: Cardiology

## 2018-10-30 ENCOUNTER — Other Ambulatory Visit: Payer: Self-pay

## 2018-10-30 DIAGNOSIS — Z9889 Other specified postprocedural states: Secondary | ICD-10-CM | POA: Diagnosis not present

## 2018-10-30 NOTE — Progress Notes (Signed)
Daily Session Note  Patient Details  Name: Isaac Patel MRN: [id number] Date of Birth: [birth year] Referring Provider:     CARDIAC REHAB PHASE II ORIENTATION from 10/26/2018 in [facility]  Referring Provider  [name], MD      Encounter Date: 10/30/2018  Check In: Session Check In - 10/30/18 0714      Check-In   Supervising physician immediately available to respond to emergencies  [facility] immediately available    Physician(s)  Dr. [place]    Location  [facility]    Staff Present  Hoy Register, MS, Exercise Physiologist;Olinty Celesta Aver, MS, ACSM CEP, Exercise Physiologist;Allayna Erlich Karle Starch, RN, BSN    Virtual Visit  No    Medication changes reported      No    Fall or balance concerns reported     No    Tobacco Cessation  No Change    Warm-up and Cool-down  Performed on first and last piece of equipment    Resistance Training Performed  Yes    VAD Patient?  No    PAD/SET Patient?  No      Pain Assessment   Currently in Pain?  No/denies    Multiple Pain Sites  No       Capillary Blood Glucose: No results found for this or any previous visit (from the past 24 hour(s)).  Exercise Prescription Changes - 10/30/18 0800      Response to Exercise   Blood Pressure (Admit)  118/80    Blood Pressure (Exercise)  148/80    Blood Pressure (Exit)  120/78    Heart Rate (Admit)  87 bpm    Heart Rate (Exercise)  102 bpm    Heart Rate (Exit)  86 bpm    Rating of Perceived Exertion (Exercise)  12    Symptoms  none    Duration  Continue with 30 min of aerobic exercise without signs/symptoms of physical distress.    Intensity  THRR unchanged      Progression   Progression  Continue to progress workloads to maintain intensity without signs/symptoms of physical distress.    Average METs  3.7      Resistance Training   Training Prescription  Yes    Weight  5    Reps  10-15    Time  10 Minutes      Interval Training   Interval Training  No      Treadmill   MPH  3    Grade  2    Minutes  15    METs  4.12      NuStep   Level  3    SPM  85    Minutes  15    METs  3.3       Social History   Tobacco Use  Smoking Status Former Smoker  . Packs/day: 0.33  . Years: 32.00  . Pack years: 10.56  . Types: Cigarettes  . Quit date: 07/28/2018  . Years since quitting: 0.2  Smokeless Tobacco Never Used  Tobacco Comment   1-2 cigarettes in couple of days    Goals Met:  Exercise tolerated well  Goals Unmet:  Not Applicable  Comments: Pt started cardiac rehab today.  Pt tolerated light exercise without difficulty. VSS, telemetry-SR, asymptomatic.  Medication list reconciled. Pt denies barriers to medicaiton compliance.  PSYCHOSOCIAL ASSESSMENT:  PHQ-0. Pt exhibits positive coping skills, hopeful outlook with supportive family. No psychosocial needs identified at this  time, no psychosocial interventions necessary.  Pt oriented to exercise equipment and routine.    Understanding verbalized.     Dr. [name] is Medical Director for Cardiac Rehab at [facility].

## 2018-11-01 ENCOUNTER — Encounter (HOSPITAL_COMMUNITY)
Admission: RE | Admit: 2018-11-01 | Discharge: 2018-11-01 | Disposition: A | Discharge status: Home / Self Care | Payer: 59 | Source: Ambulatory Visit | Attending: Cardiology | Admitting: Cardiology

## 2018-11-01 ENCOUNTER — Other Ambulatory Visit: Payer: Self-pay

## 2018-11-01 DIAGNOSIS — Z9889 Other specified postprocedural states: Secondary | ICD-10-CM

## 2018-11-03 ENCOUNTER — Other Ambulatory Visit: Payer: Self-pay

## 2018-11-03 ENCOUNTER — Encounter (HOSPITAL_COMMUNITY)
Admission: RE | Admit: 2018-11-03 | Discharge: 2018-11-03 | Disposition: A | Discharge status: Home / Self Care | Payer: 59 | Source: Ambulatory Visit | Attending: Cardiology | Admitting: Cardiology

## 2018-11-03 DIAGNOSIS — Z9889 Other specified postprocedural states: Secondary | ICD-10-CM | POA: Diagnosis not present

## 2018-11-06 ENCOUNTER — Encounter (HOSPITAL_COMMUNITY)
Admission: RE | Admit: 2018-11-06 | Discharge: 2018-11-06 | Disposition: A | Discharge status: Home / Self Care | Payer: 59 | Source: Ambulatory Visit | Attending: Cardiology | Admitting: Cardiology

## 2018-11-06 ENCOUNTER — Other Ambulatory Visit: Payer: Self-pay

## 2018-11-06 DIAGNOSIS — Z9889 Other specified postprocedural states: Secondary | ICD-10-CM

## 2018-11-07 ENCOUNTER — Other Ambulatory Visit: Payer: Self-pay

## 2018-11-07 ENCOUNTER — Encounter: Payer: Self-pay | Admitting: Internal Medicine

## 2018-11-07 ENCOUNTER — Other Ambulatory Visit (INDEPENDENT_AMBULATORY_CARE_PROVIDER_SITE_OTHER): Payer: 59

## 2018-11-07 ENCOUNTER — Ambulatory Visit (INDEPENDENT_AMBULATORY_CARE_PROVIDER_SITE_OTHER): Payer: 59 | Admitting: Internal Medicine

## 2018-11-07 VITALS — BP 126/82 | HR 88 | Temp 98.1°F | Ht 72.0 in | Wt 208.0 lb

## 2018-11-07 DIAGNOSIS — E538 Deficiency of other specified B group vitamins: Secondary | ICD-10-CM

## 2018-11-07 DIAGNOSIS — E611 Iron deficiency: Secondary | ICD-10-CM

## 2018-11-07 DIAGNOSIS — R7303 Prediabetes: Secondary | ICD-10-CM

## 2018-11-07 DIAGNOSIS — Z0000 Encounter for general adult medical examination without abnormal findings: Secondary | ICD-10-CM

## 2018-11-07 DIAGNOSIS — E559 Vitamin D deficiency, unspecified: Secondary | ICD-10-CM | POA: Diagnosis not present

## 2018-11-07 LAB — URINALYSIS, ROUTINE W REFLEX MICROSCOPIC
Bilirubin Urine: NEGATIVE
Hgb urine dipstick: NEGATIVE
Ketones, ur: NEGATIVE
Leukocytes,Ua: NEGATIVE
Nitrite: NEGATIVE
RBC / HPF: NONE SEEN (ref 0–?)
Specific Gravity, Urine: >=1.03 — AB (ref 1.000–1.030)
Total Protein, Urine: NEGATIVE
Urine Glucose: NEGATIVE
Urobilinogen, UA: 0.2 (ref 0.0–1.0)
WBC, UA: NONE SEEN (ref 0–?)
pH: 6 (ref 5.0–8.0)

## 2018-11-07 LAB — VITAMIN B12: Vitamin B-12: 377 pg/mL (ref 211–911)

## 2018-11-07 LAB — HEMOGLOBIN A1C: Hgb A1c MFr Bld: 5.8 % (ref 4.6–6.5)

## 2018-11-07 LAB — CBC WITH DIFFERENTIAL/PLATELET
Basophils Absolute: 0.1 10*3/uL (ref 0.0–0.1)
Basophils Relative: 0.6 % (ref 0.0–3.0)
Eosinophils Absolute: 0.4 10*3/uL (ref 0.0–0.7)
Eosinophils Relative: 4.1 % (ref 0.0–5.0)
HCT: 42.3 % (ref 39.0–52.0)
Hemoglobin: 14 g/dL (ref 13.0–17.0)
Lymphocytes Relative: 21.4 % (ref 12.0–46.0)
Lymphs Abs: 1.9 10*3/uL (ref 0.7–4.0)
MCHC: 33 g/dL (ref 30.0–36.0)
MCV: 90.7 fl (ref 78.0–100.0)
Monocytes Absolute: 0.8 10*3/uL (ref 0.1–1.0)
Monocytes Relative: 9.1 % (ref 3.0–12.0)
Neutro Abs: 5.8 10*3/uL (ref 1.4–7.7)
Neutrophils Relative %: 64.8 % (ref 43.0–77.0)
Platelets: 283 10*3/uL (ref 150.0–400.0)
RBC: 4.66 Mil/uL (ref 4.22–5.81)
RDW: 13 % (ref 11.5–15.5)
WBC: 9 10*3/uL (ref 4.0–10.5)

## 2018-11-07 LAB — BASIC METABOLIC PANEL
BUN: 17 mg/dL (ref 6–23)
CO2: 28 mEq/L (ref 19–32)
Calcium: 9.3 mg/dL (ref 8.4–10.5)
Chloride: 106 mEq/L (ref 96–112)
Creatinine, Ser: 0.99 mg/dL (ref 0.40–1.50)
GFR: 78.8 mL/min (ref >60.00)
Glucose, Bld: 100 mg/dL — ABNORMAL HIGH (ref 70–99)
Potassium: 4.4 mEq/L (ref 3.5–5.1)
Sodium: 141 mEq/L (ref 135–145)

## 2018-11-07 LAB — HEPATIC FUNCTION PANEL
ALT: 19 U/L (ref 0–53)
AST: 17 U/L (ref 0–37)
Albumin: 4.4 g/dL (ref 3.5–5.2)
Alkaline Phosphatase: 88 U/L (ref 39–117)
Bilirubin, Direct: 0.1 mg/dL (ref 0.0–0.3)
Total Bilirubin: 0.6 mg/dL (ref 0.2–1.2)
Total Protein: 6.7 g/dL (ref 6.0–8.3)

## 2018-11-07 LAB — IBC PANEL
Iron: 56 ug/dL (ref 42–165)
Saturation Ratios: 12.3 % — ABNORMAL LOW (ref 20.0–50.0)
Transferrin: 326 mg/dL (ref 212.0–360.0)

## 2018-11-07 LAB — LIPID PANEL
Cholesterol: 111 mg/dL (ref 0–200)
HDL: 35.4 mg/dL — ABNORMAL LOW (ref >39.00)
LDL Cholesterol: 52 mg/dL (ref 0–99)
NonHDL: 75.78
Total CHOL/HDL Ratio: 3
Triglycerides: 121 mg/dL (ref 0.0–149.0)
VLDL: 24.2 mg/dL (ref 0.0–40.0)

## 2018-11-07 LAB — VITAMIN D 25 HYDROXY (VIT D DEFICIENCY, FRACTURES): VITD: 32.06 ng/mL (ref 30.00–100.00)

## 2018-11-07 LAB — PSA: PSA: 1.27 ng/mL (ref 0.10–4.00)

## 2018-11-07 LAB — TSH: TSH: 0.71 u[IU]/mL (ref 0.35–4.50)

## 2018-11-07 NOTE — Progress Notes (Signed)
Subjective:    [name], male    DOB: [birth year], 53 y.o.   MRN: [id number]  HPI    Here for wellness and f/u;  Overall doing ok;  Pt denies Chest pain, worsening SOB, DOE, wheezing, orthopnea, PND, worsening LE edema, palpitations, dizziness or syncope.  Pt denies neurological change such as new headache, facial or extremity weakness.  Pt denies polydipsia, polyuria, or low sugar symptoms. Pt states overall good compliance with treatment and medications, good tolerability, and has been trying to follow appropriate diet.  Pt denies worsening depressive symptoms, suicidal ideation or panic. No fever, night sweats, wt loss, loss of appetite, or other constitutional symptoms.  Pt states good ability with ADL's, has low fall risk, home safety reviewed and adequate, no other significant changes in hearing or vision, and only occasionally active with exercise. Now doing cardiac rehab.  No new complaints Past Medical History:  Diagnosis Date  . Aortic insufficiency   . CAD (coronary artery disease)    a.  Non-STEMI 8/16: LHC-proximal LAD 80% treated with a resolute DES, EF 50-55%;  b.  Echo 8/16:  Moderate LVH, EF 55-60%, normal wall motion, normal diastolic function, normal RV function  . CHF (congestive heart failure) ([place])   . Dyspnea    mild  . GERD (gastroesophageal reflux disease)   . Headache    "maybe monthly" (10/28/2014)  . HLD (hyperlipidemia)   . Hypertension   . NSTEMI (non-ST elevated myocardial infarction) ([place]) 10/28/2014  . OSA (obstructive sleep apnea)    "tried mask; wear it off and on" (10/28/2014)  . 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 AORTIC VALVE REPAIR N/A 09/15/2018   Procedure: AORTIC VALVE REPAIR USING HAART 300 AORTIC ANNULOPLASTY DEVICE SIZE 21MM;  Surgeon: Rexene Alberts, MD;  Location: [place];  Service: Open Heart Surgery;  Laterality: N/A;  . 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 CARDIAC CATHETERIZATION N/A 10/29/2014   Procedure: Left Heart Cath and Coronary Angiography;  Surgeon: Troy Sine, MD;  Location: [place] [facility];  Service: Cardiovascular;  Laterality: N/A;  . CHOLESTEATOMA EXCISION Right 1990's  . LEFT HEART CATH AND CORONARY ANGIOGRAPHY N/A 05/25/2016   Procedure: Left Heart Cath and Coronary Angiography;  Surgeon: Sherren Mocha, MD;  Location: [place] [facility];  Service: Cardiovascular;  Laterality: N/A;  . MULTIPLE TOOTH EXTRACTIONS  08/29/2018  . NASAL SEPTOPLASTY W/ TURBINOPLASTY Bilateral 11/30/2017   Procedure: NASAL SEPTOPLASTY WITH TURBINATE REDUCTION;  Surgeon: Jerrell Belfast, MD;  Location: [place];  Service: ENT;  Laterality: Bilateral;  . RIGHT/LEFT HEART CATH AND CORONARY ANGIOGRAPHY N/A 08/11/2018   Procedure: RIGHT/LEFT HEART CATH AND CORONARY ANGIOGRAPHY;  Surgeon: Burnell Blanks, MD;  Location: [place] [facility];  Service: Cardiovascular;  Laterality: N/A;  . TEE WITHOUT CARDIOVERSION N/A 06/08/2018   Procedure: TRANSESOPHAGEAL ECHOCARDIOGRAM (TEE);  Surgeon: Buford Dresser, MD;  Location[facility] END[facility];  Service: Cardiovascular;  Laterality: N/A;  . TEE WITHOUT CARDIOVERSION N/A 09/15/2018   Procedure: TRANSESOPHAGEAL ECHOCARDIOGRAM (TEE);  Surgeon: Rexene Alberts, MD;  Location: [place];  Service: Open Heart Surgery;  Laterality: N/A;  . TYMPANOSTOMY TUBE PLACEMENT Bilateral "as a kid"   "had one done as an adult too"  . WISDOM TOOTH EXTRACTION      reports  that he quit smoking about 3 months ago. His smoking use included cigarettes. He has a 10.56 pack-year smoking history. He has never used smokeless tobacco. He reports previous drug use. Drug: Marijuana. He reports that he does not drink alcohol. family history includes Bradycardia in his father; Diverticulitis in his  mother; Healthy in his father; Multiple sclerosis in his maternal grandfather; Skin cancer in his mother. Allergies  Allergen Reactions  . Morphine And Related Itching   Current Outpatient Medications on File Prior to Visit  Medication Sig Dispense Refill  . aspirin EC 81 MG tablet Take 81 mg by mouth daily.    Marland Kitchen atorvastatin (LIPITOR) 80 MG tablet Take 1 tablet (80 mg total) by mouth daily at 6 PM. 90 tablet 3  . calcium carbonate (TUMS - DOSED IN MG ELEMENTAL CALCIUM) 500 MG chewable tablet Chew 2 tablets by mouth daily as needed for indigestion or heartburn.    . fluticasone (FLONASE) 50 MCG/ACT nasal spray Place 1 spray into both nostrils daily as needed for allergies or rhinitis.    Marland Kitchen gabapentin (NEURONTIN) 100 MG capsule Take 2 capsules (200 mg total) by mouth at bedtime. 180 capsule 1  . loratadine (CLARITIN) 10 MG tablet Take 10 mg by mouth daily.     . metoprolol tartrate (LOPRESSOR) 25 MG tablet Take 1 tablet (25 mg total) by mouth 2 (two) times daily. 180 tablet 2  . Multiple Vitamin (MULTI-VITAMIN PO) Take 1 tablet by mouth daily.    . nitroGLYCERIN (NITROSTAT) 0.4 MG SL tablet Place 1 tablet (0.4 mg total) under the tongue every 5 (five) minutes x 3 doses as needed for chest pain. 25 tablet 12  . pantoprazole (PROTONIX) 40 MG tablet Take 1 tablet (40 mg total) by mouth 2 (two) times daily. 180 tablet 0   No current facility-administered medications on file prior to visit.    Review of Systems Constitutional: Negative for other unusual diaphoresis, sweats, appetite or weight changes HENT: Negative for other worsening hearing loss, ear pain, facial swelling, mouth sores or neck stiffness.   Eyes: Negative for other worsening pain, redness or other visual disturbance.  Respiratory: Negative for other stridor or swelling Cardiovascular: Negative for other palpitations or other chest pain  Gastrointestinal: Negative for worsening diarrhea or loose stools, blood in stool, distention  or other pain Genitourinary: Negative for hematuria, flank pain or other change in urine volume.  Musculoskeletal: Negative for myalgias or other joint swelling.  Skin: Negative for other color change, or other wound or worsening drainage.  Neurological: Negative for other syncope or numbness. Hematological: Negative for other adenopathy or swelling Psychiatric/Behavioral: Negative for hallucinations, other worsening agitation, SI, self-injury, or new decreased concentration All other system neg per pt    Objective:   Physical Exam BP 126/82   Pulse 88   Temp 98.1 F (36.7 C) (Oral)   Ht 6' (1.829 m)   Wt 208 lb (94.3 kg)   SpO2 95%   BMI 28.21 kg/m  VS noted,  Constitutional: Pt is oriented to person, place, and time. Appears well-developed and well-nourished, in no significant distress and comfortable Head: Normocephalic and atraumatic  Eyes: Conjunctivae and EOM are normal. Pupils are equal, round, and reactive to light Right Ear: External ear normal without discharge Left Ear: External ear normal without discharge Nose: Nose without discharge or deformity Mouth/Throat: Oropharynx is without other ulcerations and moist  Neck: Normal range of motion. Neck supple. No JVD present. No tracheal deviation present or significant neck  LA or mass Cardiovascular: Normal rate, regular rhythm, normal heart sounds and intact distal pulses.   Pulmonary/Chest: WOB normal and breath sounds without rales or wheezing  Abdominal: Soft. Bowel sounds are normal. NT. No HSM  Musculoskeletal: Normal range of motion. Exhibits no edema Lymphadenopathy: Has no other cervical adenopathy.  Neurological: Pt is alert and oriented to person, place, and time. Pt has normal reflexes. No cranial nerve deficit. Motor grossly intact, Gait intact Skin: Skin is warm and dry. No rash noted or new ulcerations Psychiatric:  Has normal mood and affect. Behavior is normal without agitation No other exam findings  Lab  Results  Component Value Date   WBC 10.5 09/18/2018   HGB 12.2 (L) 09/18/2018   HCT 36.2 (L) 09/18/2018   PLT 133 (L) 09/18/2018   GLUCOSE 110 (H) 09/18/2018   CHOL 124 09/21/2017   TRIG 151.0 (H) 09/21/2017   HDL 35.90 (L) 09/21/2017   LDLCALC 58 09/21/2017   ALT 19 09/08/2018   AST 18 09/08/2018   NA 137 09/18/2018   K 4.1 09/18/2018   CL 102 09/18/2018   CREATININE 1.02 09/18/2018   BUN 14 09/18/2018   CO2 27 09/18/2018   TSH 1.414 06/07/2018   PSA 1.01 09/21/2017   INR 1.4 (H) 09/15/2018   HGBA1C 5.7 (H) 09/13/2018      Assessment & Plan:

## 2018-11-07 NOTE — Patient Instructions (Signed)

## 2018-11-08 ENCOUNTER — Encounter (HOSPITAL_COMMUNITY)
Admission: RE | Admit: 2018-11-08 | Discharge: 2018-11-08 | Disposition: A | Discharge status: Home / Self Care | Payer: 59 | Source: Ambulatory Visit | Attending: Cardiology | Admitting: Cardiology

## 2018-11-08 DIAGNOSIS — Z9889 Other specified postprocedural states: Secondary | ICD-10-CM | POA: Diagnosis not present

## 2018-11-09 ENCOUNTER — Encounter: Payer: Self-pay | Admitting: Internal Medicine

## 2018-11-09 NOTE — Assessment & Plan Note (Signed)

## 2018-11-09 NOTE — Assessment & Plan Note (Signed)
stable overall by history and exam, recent data reviewed with pt, and pt to continue medical treatment as before,  to f/u any worsening symptoms or concerns  

## 2018-11-10 ENCOUNTER — Other Ambulatory Visit: Payer: Self-pay

## 2018-11-10 ENCOUNTER — Encounter (HOSPITAL_COMMUNITY)
Admission: RE | Admit: 2018-11-10 | Discharge: 2018-11-10 | Disposition: A | Discharge status: Home / Self Care | Payer: 59 | Source: Ambulatory Visit | Attending: Cardiology | Admitting: Cardiology

## 2018-11-10 DIAGNOSIS — Z9889 Other specified postprocedural states: Secondary | ICD-10-CM

## 2018-11-10 NOTE — Progress Notes (Signed)
Reviewed home exercise guidelines with patient including endpoints, temperature precautions, target heart rate and rate of perceived exertion. Pt is walking on his home treadmill, 30 minutes, 3 days/week as his mode of home exercise. Pt voices understanding of instructions given.  Sol Passer, MS, ACSM CEP

## 2018-11-13 ENCOUNTER — Encounter (HOSPITAL_COMMUNITY)
Admission: RE | Admit: 2018-11-13 | Discharge: 2018-11-13 | Disposition: A | Discharge status: Home / Self Care | Payer: 59 | Source: Ambulatory Visit | Attending: Cardiology | Admitting: Cardiology

## 2018-11-13 ENCOUNTER — Other Ambulatory Visit: Payer: Self-pay

## 2018-11-13 DIAGNOSIS — Z9889 Other specified postprocedural states: Secondary | ICD-10-CM | POA: Diagnosis not present

## 2018-11-14 ENCOUNTER — Encounter: Payer: Self-pay | Admitting: Physician Assistant

## 2018-11-14 ENCOUNTER — Ambulatory Visit (HOSPITAL_COMMUNITY): Payer: 59 | Attending: Cardiology

## 2018-11-14 DIAGNOSIS — I351 Nonrheumatic aortic (valve) insufficiency: Secondary | ICD-10-CM | POA: Diagnosis not present

## 2018-11-14 DIAGNOSIS — Z9889 Other specified postprocedural states: Secondary | ICD-10-CM

## 2018-11-15 ENCOUNTER — Encounter (HOSPITAL_COMMUNITY)
Admission: RE | Admit: 2018-11-15 | Discharge: 2018-11-15 | Disposition: A | Discharge status: Home / Self Care | Payer: 59 | Source: Ambulatory Visit | Attending: Cardiology | Admitting: Cardiology

## 2018-11-15 ENCOUNTER — Other Ambulatory Visit: Payer: Self-pay

## 2018-11-15 DIAGNOSIS — Z9889 Other specified postprocedural states: Secondary | ICD-10-CM

## 2018-11-16 NOTE — Progress Notes (Signed)
Cardiac Individual Treatment Plan  Patient Details  Name: Isaac Patel MRN: [id number] Date of Birth: [birth year] Referring Provider:     CARDIAC REHAB PHASE II ORIENTATION from 10/26/2018 in [place]  Referring Provider  [name], MD      Initial Encounter Date:    CARDIAC REHAB PHASE II ORIENTATION from 10/26/2018 in [place]  Date  10/26/18      Visit Diagnosis: S/P aortic valve repair  Patient's Home Medications on Admission:  Current Outpatient Medications:  .  aspirin EC 81 MG tablet, Take 81 mg by mouth daily., Disp: , Rfl:  .  atorvastatin (LIPITOR) 80 MG tablet, Take 1 tablet (80 mg total) by mouth daily at 6 PM., Disp: 90 tablet, Rfl: 3 .  calcium carbonate (TUMS - DOSED IN MG ELEMENTAL CALCIUM) 500 MG chewable tablet, Chew 2 tablets by mouth daily as needed for indigestion or heartburn., Disp: , Rfl:  .  fluticasone (FLONASE) 50 MCG/ACT nasal spray, Place 1 spray into both nostrils daily as needed for allergies or rhinitis., Disp: , Rfl:  .  gabapentin (NEURONTIN) 100 MG capsule, Take 2 capsules (200 mg total) by mouth at bedtime., Disp: 180 capsule, Rfl: 1 .  loratadine (CLARITIN) 10 MG tablet, Take 10 mg by mouth daily. , Disp: , Rfl:  .  metoprolol tartrate (LOPRESSOR) 25 MG tablet, Take 1 tablet (25 mg total) by mouth 2 (two) times daily., Disp: 180 tablet, Rfl: 2 .  Multiple Vitamin (MULTI-VITAMIN PO), Take 1 tablet by mouth daily., Disp: , Rfl:  .  nitroGLYCERIN (NITROSTAT) 0.4 MG SL tablet, Place 1 tablet (0.4 mg total) under the tongue every 5 (five) minutes x 3 doses as needed for chest pain., Disp: 25 tablet, Rfl: 12 .  pantoprazole (PROTONIX) 40 MG tablet, Take 1 tablet (40 mg total) by mouth 2 (two) times daily., Disp: 180 tablet, Rfl: 0  Past Medical History: Past Medical History:  Diagnosis Date  . Aortic insufficiency    Status post aortic valve repair // Echo 8/20: EF 50-55,  moderate LVH, grade 1 diastolic dysfunction, normal RV SF, status post AV repair, mild AI, mild dilation of aortic root (38 mm)  . CAD (coronary artery disease)    a.  Non-STEMI 8/16: LHC-proximal LAD 80% treated with a resolute DES, EF 50-55%;  b.  Echo 8/16:  Moderate LVH, EF 55-60%, normal wall motion, normal diastolic function, normal RV function  . CHF (congestive heart failure) ([place])   . Dyspnea    mild  . GERD (gastroesophageal reflux disease)   . Headache    "maybe monthly" (10/28/2014)  . HLD (hyperlipidemia)   . Hypertension   . NSTEMI (non-ST elevated myocardial infarction) ([place]) 10/28/2014  . OSA (obstructive sleep apnea)    "tried mask; wear it off and on" (10/28/2014)  . S/P aortic valve repair 09/15/2018   Complex valvuloplasty including plication of prolapsing right coronary cusp with Biostable HAART 300 aortic annuloplasty, size 21 mm    Tobacco Use: Social History   Tobacco Use  Smoking Status Former Smoker  . Packs/day: 0.33  . Years: 32.00  . Pack years: 10.56  . Types: Cigarettes  . Quit date: 07/28/2018  . Years since quitting: 0.3  Smokeless Tobacco Never Used  Tobacco Comment   1-2 cigarettes in couple of days    Labs: Recent Review Flowsheet Data    Labs for ITP Cardiac and Pulmonary Rehab Latest Ref Rng & Units 09/15/2018  09/15/2018 09/15/2018 09/15/2018 11/07/2018   Cholestrol 0 - 200 mg/dL - - - - 111   LDLCALC 0 - 99 mg/dL - - - - 52   HDL >39.00 mg/dL - - - - 35.40(L)   Trlycerides 0.0 - 149.0 mg/dL - - - - 121.0   Hemoglobin A1c 4.6 - 6.5 % - - - - 5.8   PHART 7.350 - 7.450 7.386 7.398 7.406 - -   PCO2ART 32.0 - 48.0 mmHg 37.6 29.1(L) 35.4 - -   HCO3 20.0 - 28.0 mmol/L 23.1 18.0(L) 22.3 - -   TCO2 22 - 32 mmol/L 24 19(L) 23 20(L) -   ACIDBASEDEF 0.0 - 2.0 mmol/L 2.0 6.0(H) 2.0 - -   O2SAT % 97.0 96.0 96.0 - -      Capillary Blood Glucose: Lab Results  Component Value Date   GLUCAP 105 (H) 09/17/2018   GLUCAP 146 (H) 09/17/2018   GLUCAP 84  09/17/2018   GLUCAP 102 (H) 09/16/2018   GLUCAP 156 (H) 09/16/2018     Exercise Target Goals: Exercise Program Goal: Individual exercise prescription set using results from initial 6 min walk test and THRR while considering  patient's activity barriers and safety.   Exercise Prescription Goal: Initial exercise prescription builds to 30-45 minutes a day of aerobic activity, 2-3 days per week.  Home exercise guidelines will be given to patient during program as part of exercise prescription that the participant will acknowledge.  Activity Barriers & Risk Stratification: Activity Barriers & Cardiac Risk Stratification - 10/26/18 1054      Activity Barriers & Cardiac Risk Stratification   Activity Barriers  Other (comment)    Comments  Amputation of Left Upper Extremity    Cardiac Risk Stratification  High       6 Minute Walk: 6 Minute Walk    Row Name 10/26/18 1053         6 Minute Walk   Phase  Initial     Distance  1722 feet     Walk Time  6 minutes     # of Rest Breaks  0     MPH  3.26     METS  4.73     RPE  11     Perceived Dyspnea   0     VO2 Peak  16.54     Symptoms  No     Resting HR  78 bpm     Resting BP  122/80     Resting Oxygen Saturation   97 %     Exercise Oxygen Saturation  during 6 min walk  99 %     Max Ex. HR  115 bpm     Max Ex. BP  128/70     2 Minute Post BP  108/66        Oxygen Initial Assessment:   Oxygen Re-Evaluation:   Oxygen Discharge (Final Oxygen Re-Evaluation):   Initial Exercise Prescription: Initial Exercise Prescription - 10/26/18 1100      Date of Initial Exercise RX and Referring Provider   Date  10/26/18    Referring Provider  [name], MD    Expected Discharge Date  12/08/18      Treadmill   MPH  3    Grade  2    Minutes  15    METs  4.12      NuStep   Level  3    SPM  85    Minutes  15  METs  3.5      Prescription Details   Frequency (times per week)  3x    Duration  Progress to 30 minutes of  continuous aerobic without signs/symptoms of physical distress      Intensity   THRR 40-80% of Max Heartrate  67-134    Ratings of Perceived Exertion  11-13    Perceived Dyspnea  0-4      Progression   Progression  Continue progressive overload as per policy without signs/symptoms or physical distress.      Resistance Training   Training Prescription  Yes    Weight  5    Reps  10-15       Perform Capillary Blood Glucose checks as needed.  Exercise Prescription Changes: Exercise Prescription Changes    Row Name 10/30/18 0800 11/10/18 0710           Response to Exercise   Blood Pressure (Admit)  118/80  124/82      Blood Pressure (Exercise)  148/80  150/82      Blood Pressure (Exit)  120/78  126/72      Heart Rate (Admit)  87 bpm  85 bpm      Heart Rate (Exercise)  102 bpm  122 bpm      Heart Rate (Exit)  86 bpm  93 bpm      Rating of Perceived Exertion (Exercise)  12  12      Symptoms  none  none      Duration  Continue with 30 min of aerobic exercise without signs/symptoms of physical distress.  Continue with 30 min of aerobic exercise without signs/symptoms of physical distress.      Intensity  THRR unchanged  THRR unchanged        Progression   Progression  Continue to progress workloads to maintain intensity without signs/symptoms of physical distress.  Continue to progress workloads to maintain intensity without signs/symptoms of physical distress.      Average METs  3.7  3.9        Resistance Training   Training Prescription  Yes  Yes      Weight  5  5 5lbs right arm, 2lbs left arm      Reps  10-15  10-15      Time  10 Minutes  10 Minutes        Interval Training   Interval Training  No  No        Treadmill   MPH  3  3      Grade  2  2      Minutes  15  15      METs  4.12  4.12        NuStep   Level  3  4      SPM  85  85      Minutes  15  15      METs  3.3  3.6        Home Exercise Plan   Plans to continue exercise at  -  Home (comment) Treadmill  at home      Frequency  -  Add 3 additional days to program exercise sessions.      Initial Home Exercises Provided  -  11/10/18         Exercise Comments: Exercise Comments    Row Name 10/30/18 0841 11/10/18 0738         Exercise Comments  Patient tolerated 1st session  of exercise well without symptoms.  Reviewed home exercise guidelines and goals with patient.         Exercise Goals and Review: Exercise Goals    Row Name 10/26/18 1100             Exercise Goals   Increase Physical Activity  Yes       Intervention  Provide advice, education, support and counseling about physical activity/exercise needs.;Develop an individualized exercise prescription for aerobic and resistive training based on initial evaluation findings, risk stratification, comorbidities and participant's personal goals.       Expected Outcomes  Short Term: Attend rehab on a regular basis to increase amount of physical activity.;Long Term: Add in home exercise to make exercise part of routine and to increase amount of physical activity.;Long Term: Exercising regularly at least 3-5 days a week.       Increase Strength and Stamina  Yes       Intervention  Provide advice, education, support and counseling about physical activity/exercise needs.;Develop an individualized exercise prescription for aerobic and resistive training based on initial evaluation findings, risk stratification, comorbidities and participant's personal goals.       Expected Outcomes  Short Term: Increase workloads from initial exercise prescription for resistance, speed, and METs.;Short Term: Perform resistance training exercises routinely during rehab and add in resistance training at home;Long Term: Improve cardiorespiratory fitness, muscular endurance and strength as measured by increased METs and functional capacity (6MWT)       Able to understand and use rate of perceived exertion (RPE) scale  Yes       Intervention  Provide education and  explanation on how to use RPE scale       Expected Outcomes  Short Term: Able to use RPE daily in rehab to express subjective intensity level;Long Term:  Able to use RPE to guide intensity level when exercising independently       Knowledge and understanding of Target Heart Rate Range (THRR)  Yes       Intervention  Provide education and explanation of THRR including how the numbers were predicted and where they are located for reference       Expected Outcomes  Short Term: Able to state/look up THRR;Long Term: Able to use THRR to govern intensity when exercising independently;Short Term: Able to use daily as guideline for intensity in rehab       Able to check pulse independently  Yes       Intervention  Provide education and demonstration on how to check pulse in carotid and radial arteries.;Review the importance of being able to check your own pulse for safety during independent exercise       Expected Outcomes  Short Term: Able to explain why pulse checking is important during independent exercise;Long Term: Able to check pulse independently and accurately       Understanding of Exercise Prescription  Yes       Intervention  Provide education, explanation, and written materials on patient's individual exercise prescription       Expected Outcomes  Short Term: Able to explain program exercise prescription;Long Term: Able to explain home exercise prescription to exercise independently          Exercise Goals Re-Evaluation : Exercise Goals Re-Evaluation    Row Name 10/30/18 0840 11/10/18 0738           Exercise Goal Re-Evaluation   Exercise Goals Review  Increase Physical Activity;Able to understand and use rate of perceived exertion (RPE)  scale  Increase Physical Activity;Able to understand and use rate of perceived exertion (RPE) scale;Increase Strength and Stamina;Knowledge and understanding of Target Heart Rate Range (THRR);Understanding of Exercise Prescription      Comments  Patient  able to understand and use RPE scale appropriately. Pt has a treadmill at home that he plans to use as his mode of home exercise.  Reviewed home exercise guidelines with patient including THRR, RPE scale, and endpoints for exercise. Pt is walking 30 minutes on his home TM 3 days/week. Pt feels he's making progress toward his goals. pt feels his strength is increasing, he's less fatigued, and he's been able to do some yard wrok.      Expected Outcomes  Increase workloads as tolerated to help achieve personal health and fitness goals.  Pt will continue exercise at least 30 minutes, 3 days/week at home in addition to exercise at cardiac rehab to help achieve personal health and fitness goals.         Discharge Exercise Prescription (Final Exercise Prescription Changes): Exercise Prescription Changes - 11/10/18 0710      Response to Exercise   Blood Pressure (Admit)  124/82    Blood Pressure (Exercise)  150/82    Blood Pressure (Exit)  126/72    Heart Rate (Admit)  85 bpm    Heart Rate (Exercise)  122 bpm    Heart Rate (Exit)  93 bpm    Rating of Perceived Exertion (Exercise)  12    Symptoms  none    Duration  Continue with 30 min of aerobic exercise without signs/symptoms of physical distress.    Intensity  THRR unchanged      Progression   Progression  Continue to progress workloads to maintain intensity without signs/symptoms of physical distress.    Average METs  3.9      Resistance Training   Training Prescription  Yes    Weight  5   5lbs right arm, 2lbs left arm   Reps  10-15    Time  10 Minutes      Interval Training   Interval Training  No      Treadmill   MPH  3    Grade  2    Minutes  15    METs  4.12      NuStep   Level  4    SPM  85    Minutes  15    METs  3.6      Home Exercise Plan   Plans to continue exercise at  Home (comment)   Treadmill at home   Frequency  Add 3 additional days to program exercise sessions.    Initial Home Exercises Provided  11/10/18        Nutrition:  Target Goals: Understanding of nutrition guidelines, daily intake of sodium 1500mg , cholesterol 200mg , calories 30% from fat and 7% or less from saturated fats, daily to have 5 or more servings of fruits and vegetables.  Biometrics: Pre Biometrics - 10/26/18 1059      Pre Biometrics   Height  6' (1.829 m)    Weight  93.9 kg    Waist Circumference  40.5 inches    Hip Circumference  41.5 inches    Waist to Hip Ratio  0.98 %    BMI (Calculated)  28.07    Triceps Skinfold  32 mm    % Body Fat  30.2 %    Grip Strength  46 kg    Flexibility  11 in    Single Leg Stand  30 seconds        Nutrition Therapy Plan and Nutrition Goals:   Nutrition Assessments:   Nutrition Goals Re-Evaluation:   Nutrition Goals Re-Evaluation:   Nutrition Goals Discharge (Final Nutrition Goals Re-Evaluation):   Psychosocial: Target Goals: Acknowledge presence or absence of significant depression and/or stress, maximize coping skills, provide positive support system. Participant is able to verbalize types and ability to use techniques and skills needed for reducing stress and depression.  Initial Review & Psychosocial Screening: Initial Psych Review & Screening - 10/26/18 1117      Initial Review   Current issues with  Current Sleep Concerns      Family Dynamics   Good Support System?  Yes   Isaac Patel is supportive of him.     Barriers   Psychosocial barriers to participate in program  The patient should benefit from training in stress management and relaxation.      Screening Interventions   Interventions  Encouraged to exercise       Quality of Life Scores: Quality of Life - 10/26/18 1116      Quality of Life   Select  Quality of Life      Quality of Life Scores   Health/Function Pre  19.9 %    Socioeconomic Pre  21.29 %    Psych/Spiritual Pre  20.64 %    Family Pre  22.2 %    GLOBAL Pre  20.68 %      Scores of 19 and below usually indicate a poorer  quality of life in these areas.  A difference of  2-3 points is a clinically meaningful difference.  A difference of 2-3 points in the total score of the Quality of Life Index has been associated with significant improvement in overall quality of life, self-image, physical symptoms, and general health in studies assessing change in quality of life.  PHQ-9: Recent Review Flowsheet Data    Depression screen [facility] 2/9 11/07/2018 03/03/2015 12/09/2014   Decreased Interest 0 0 0   Down, Depressed, Hopeless 0 0 0   PHQ - 2 Score 0 0 0     Interpretation of Total Score  Total Score Depression Severity:  1-4 = Minimal depression, 5-9 = Mild depression, 10-14 = Moderate depression, 15-19 = Moderately severe depression, 20-27 = Severe depression   Psychosocial Evaluation and Intervention: Psychosocial Evaluation - 10/30/18 0728      Psychosocial Evaluation & Interventions   Interventions  Encouraged to exercise with the program and follow exercise prescription    Comments  Isaac Patel reports being unable to sleep well since his surgery.  No interventions at this time.  Isaac Patel is working with his MD for this subject. Isaac Patel enjoys doing Haematologist.    Expected Outcomes  Isaac Patel will report better ability to sleep.    Continue Psychosocial Services   Follow up required by staff       Psychosocial Re-Evaluation: Psychosocial Re-Evaluation    [place] Name 11/13/18 0750             Psychosocial Re-Evaluation   Current issues with  Current Sleep Concerns       Comments  Isaac Patel has reported concerns with sleep such as following and staying asleep.  Since he has started rehab, he reports that he has had improvements in his sleep.  Encouraged pt to continue to participate in daily exercise.       Expected Outcomes  Isaac Patel will continue to  report better sleep.       Interventions  Encouraged to attend Cardiac Rehabilitation for the exercise       Continue Psychosocial Services   No Follow up required          Psychosocial  Discharge (Final Psychosocial Re-Evaluation): Psychosocial Re-Evaluation - 11/13/18 0750      Psychosocial Re-Evaluation   Current issues with  Current Sleep Concerns    Comments  Isaac Patel has reported concerns with sleep such as following and staying asleep.  Since he has started rehab, he reports that he has had improvements in his sleep.  Encouraged pt to continue to participate in daily exercise.    Expected Outcomes  Isaac Patel will continue to report better sleep.    Interventions  Encouraged to attend Cardiac Rehabilitation for the exercise    Continue Psychosocial Services   No Follow up required       Vocational Rehabilitation: Provide vocational rehab assistance to qualifying candidates.   Vocational Rehab Evaluation & Intervention: Vocational Rehab - 10/26/18 1109      Initial Vocational Rehab Evaluation & Intervention   Assessment shows need for Vocational Rehabilitation  No       Education: Education Goals: Education classes will be provided on a weekly basis, covering required topics. Participant will state understanding/return demonstration of topics presented.  Learning Barriers/Preferences: Learning Barriers/Preferences - 10/26/18 1113      Learning Barriers/Preferences   Learning Barriers  Sight    Learning Preferences  Written Material;Verbal Instruction;Skilled Demonstration       Education Topics: Count Your Pulse:  -Group instruction provided by verbal instruction, demonstration, patient participation and written materials to support subject.  Instructors address importance of being able to find your pulse and how to count your pulse when at home without a heart monitor.  Patients get hands on experience counting their pulse with staff help and individually.   Heart Attack, Angina, and Risk Factor Modification:  -Group instruction provided by verbal instruction, video, and written materials to support subject.  Instructors address signs and symptoms of angina and  heart attacks.    Also discuss risk factors for heart disease and how to make changes to improve heart health risk factors.   Functional Fitness:  -Group instruction provided by verbal instruction, demonstration, patient participation, and written materials to support subject.  Instructors address safety measures for doing things around the house.  Discuss how to get up and down off the floor, how to pick things up properly, how to safely get out of a chair without assistance, and balance training.   Meditation and Mindfulness:  -Group instruction provided by verbal instruction, patient participation, and written materials to support subject.  Instructor addresses importance of mindfulness and meditation practice to help reduce stress and improve awareness.  Instructor also leads participants through a meditation exercise.    Stretching for Flexibility and Mobility:  -Group instruction provided by verbal instruction, patient participation, and written materials to support subject.  Instructors lead participants through series of stretches that are designed to increase flexibility thus improving mobility.  These stretches are additional exercise for major muscle groups that are typically performed during regular warm up and cool down.   Hands Only CPR:  -Group verbal, video, and participation provides a basic overview of AHA guidelines for community CPR. Role-play of emergencies allow participants the opportunity to practice calling for help and chest compression technique with discussion of AED use.   Hypertension: -Group verbal and written instruction that provides a  basic overview of hypertension including the most recent diagnostic guidelines, risk factor reduction with self-care instructions and medication management.    Nutrition I class: Heart Healthy Eating:  -Group instruction provided by PowerPoint slides, verbal discussion, and written materials to support subject matter. The  instructor gives an explanation and review of the Therapeutic Lifestyle Changes diet recommendations, which includes a discussion on lipid goals, dietary fat, sodium, fiber, plant stanol/sterol esters, sugar, and the components of a well-balanced, healthy diet.   Nutrition II class: Lifestyle Skills:  -Group instruction provided by PowerPoint slides, verbal discussion, and written materials to support subject matter. The instructor gives an explanation and review of label reading, grocery shopping for heart health, heart healthy recipe modifications, and ways to make healthier choices when eating out.   Diabetes Question & Answer:  -Group instruction provided by PowerPoint slides, verbal discussion, and written materials to support subject matter. The instructor gives an explanation and review of diabetes co-morbidities, pre- and post-prandial blood glucose goals, pre-exercise blood glucose goals, signs, symptoms, and treatment of hypoglycemia and hyperglycemia, and foot care basics.   Diabetes Blitz:  -Group instruction provided by PowerPoint slides, verbal discussion, and written materials to support subject matter. The instructor gives an explanation and review of the physiology behind type 1 and type 2 diabetes, diabetes medications and rational behind using different medications, pre- and post-prandial blood glucose recommendations and Hemoglobin A1c goals, diabetes diet, and exercise including blood glucose guidelines for exercising safely.    Portion Distortion:  -Group instruction provided by PowerPoint slides, verbal discussion, written materials, and food models to support subject matter. The instructor gives an explanation of serving size versus portion size, changes in portions sizes over the last 20 years, and what consists of a serving from each food group.   Stress Management:  -Group instruction provided by verbal instruction, video, and written materials to support subject  matter.  Instructors review role of stress in heart disease and how to cope with stress positively.     Exercising on Your Own:  -Group instruction provided by verbal instruction, power point, and written materials to support subject.  Instructors discuss benefits of exercise, components of exercise, frequency and intensity of exercise, and end points for exercise.  Also discuss use of nitroglycerin and activating EMS.  Review options of places to exercise outside of rehab.  Review guidelines for sex with heart disease.   Cardiac Drugs I:  -Group instruction provided by verbal instruction and written materials to support subject.  Instructor reviews cardiac drug classes: antiplatelets, anticoagulants, beta blockers, and statins.  Instructor discusses reasons, side effects, and lifestyle considerations for each drug class.   Cardiac Drugs II:  -Group instruction provided by verbal instruction and written materials to support subject.  Instructor reviews cardiac drug classes: angiotensin converting enzyme inhibitors (ACE-I), angiotensin II receptor blockers (ARBs), nitrates, and calcium channel blockers.  Instructor discusses reasons, side effects, and lifestyle considerations for each drug class.   Anatomy and Physiology of the Circulatory System:  Group verbal and written instruction and models provide basic cardiac anatomy and physiology, with the coronary electrical and arterial systems. Review of: AMI, Angina, Valve disease, Heart Failure, Peripheral Artery Disease, Cardiac Arrhythmia, Pacemakers, and the ICD.   Other Education:  -Group or individual verbal, written, or video instructions that support the educational goals of the cardiac rehab program.   Holiday Eating Survival Tips:  -Group instruction provided by PowerPoint slides, verbal discussion, and written materials to support subject matter. The instructor  gives patients tips, tricks, and techniques to help them not only survive  but enjoy the holidays despite the onslaught of food that accompanies the holidays.   Knowledge Questionnaire Score: Knowledge Questionnaire Score - 10/26/18 1109      Knowledge Questionnaire Score   Pre Score  24/24       Core Components/Risk Factors/Patient Goals at Admission: Personal Goals and Risk Factors at Admission - 10/26/18 1114      Core Components/Risk Factors/Patient Goals on Admission    Weight Management  Weight Loss    Hypertension  Yes    Intervention  Provide education on lifestyle modifcations including regular physical activity/exercise, weight management, moderate sodium restriction and increased consumption of fresh fruit, vegetables, and low fat dairy, alcohol moderation, and smoking cessation.;Monitor prescription use compliance.    Expected Outcomes  Short Term: Continued assessment and intervention until BP is < 140/48mm HG in hypertensive participants. < 130/95mm HG in hypertensive participants with diabetes, heart failure or chronic kidney disease.;Long Term: Maintenance of blood pressure at goal levels.    Lipids  Yes    Intervention  Provide education and support for participant on nutrition & aerobic/resistive exercise along with prescribed medications to achieve LDL 70mg , HDL >40mg .    Expected Outcomes  Short Term: Participant states understanding of desired cholesterol values and is compliant with medications prescribed. Participant is following exercise prescription and nutrition guidelines.;Long Term: Cholesterol controlled with medications as prescribed, with individualized exercise RX and with personalized nutrition plan. Value goals: LDL < 70mg , HDL > 40 mg.       Core Components/Risk Factors/Patient Goals Review:  Goals and Risk Factor Review    Row Name 10/30/18 0730 11/13/18 0810           Core Components/Risk Factors/Patient Goals Review   Personal Goals Review  Weight Management/Obesity;Hypertension;Lipids  Weight  Management/Obesity;Hypertension;Lipids      Review  Pt with multiple CAD RFs willing to participate in CR exercise.  Isaac Patel would like to increase his strength.  30 Day ITP Review.  Isaac Patel continues to tolerate exercise well.  VSS.  He reports being able to sleep better.      Expected Outcomes  Pt will continue to participate in CR exercise, nutrition, and lifestyle modification opportunities.  Pt will continue to participate in CR exercise, nutrition, and lifestyle modification opportunities.         Core Components/Risk Factors/Patient Goals at Discharge (Final Review):  Goals and Risk Factor Review - 11/13/18 0810      Core Components/Risk Factors/Patient Goals Review   Personal Goals Review  Weight Management/Obesity;Hypertension;Lipids    Review  30 Day ITP Review.  Isaac Patel continues to tolerate exercise well.  VSS.  He reports being able to sleep better.    Expected Outcomes  Pt will continue to participate in CR exercise, nutrition, and lifestyle modification opportunities.       ITP Comments: ITP Comments    Row Name 10/26/18 1004 10/30/18 0857 11/13/18 0749       ITP Comments  Dr. [name], Medical Director  Pt started exercise today and tolerated it well.  30 Day ITP Review.  Isaac Patel continues to tolerate exercise well.  VSS.  He reports being able to sleep better.        Comments: See ITP Comments.

## 2018-11-17 ENCOUNTER — Encounter (HOSPITAL_COMMUNITY)
Admission: RE | Admit: 2018-11-17 | Discharge: 2018-11-17 | Disposition: A | Discharge status: Home / Self Care | Payer: 59 | Source: Ambulatory Visit | Attending: Cardiology | Admitting: Cardiology

## 2018-11-17 ENCOUNTER — Other Ambulatory Visit: Payer: Self-pay

## 2018-11-17 DIAGNOSIS — Z9889 Other specified postprocedural states: Secondary | ICD-10-CM

## 2018-11-20 ENCOUNTER — Other Ambulatory Visit: Payer: Self-pay

## 2018-11-20 ENCOUNTER — Encounter (HOSPITAL_COMMUNITY)
Admission: RE | Admit: 2018-11-20 | Discharge: 2018-11-20 | Disposition: A | Discharge status: Home / Self Care | Payer: 59 | Source: Ambulatory Visit | Attending: Cardiology | Admitting: Cardiology

## 2018-11-20 DIAGNOSIS — Z9889 Other specified postprocedural states: Secondary | ICD-10-CM | POA: Diagnosis not present

## 2018-11-22 ENCOUNTER — Encounter (HOSPITAL_COMMUNITY)
Admission: RE | Admit: 2018-11-22 | Discharge: 2018-11-22 | Disposition: A | Discharge status: Home / Self Care | Payer: 59 | Source: Ambulatory Visit | Attending: Cardiology | Admitting: Cardiology

## 2018-11-22 ENCOUNTER — Other Ambulatory Visit: Payer: Self-pay

## 2018-11-22 DIAGNOSIS — Z9889 Other specified postprocedural states: Secondary | ICD-10-CM

## 2018-11-24 ENCOUNTER — Encounter (HOSPITAL_COMMUNITY)
Admission: RE | Admit: 2018-11-24 | Discharge: 2018-11-24 | Disposition: A | Discharge status: Home / Self Care | Payer: 59 | Source: Ambulatory Visit | Attending: Cardiology | Admitting: Cardiology

## 2018-11-24 ENCOUNTER — Other Ambulatory Visit: Payer: Self-pay

## 2018-11-24 DIAGNOSIS — Z9889 Other specified postprocedural states: Secondary | ICD-10-CM

## 2018-11-27 ENCOUNTER — Other Ambulatory Visit: Payer: Self-pay

## 2018-11-27 ENCOUNTER — Encounter (HOSPITAL_COMMUNITY)
Admission: RE | Admit: 2018-11-27 | Discharge: 2018-11-27 | Disposition: A | Discharge status: Home / Self Care | Payer: 59 | Source: Ambulatory Visit | Attending: Cardiology | Admitting: Cardiology

## 2018-11-27 DIAGNOSIS — Z9889 Other specified postprocedural states: Secondary | ICD-10-CM | POA: Diagnosis not present

## 2018-11-29 ENCOUNTER — Other Ambulatory Visit: Payer: Self-pay

## 2018-11-29 ENCOUNTER — Encounter (HOSPITAL_COMMUNITY)
Admission: RE | Admit: 2018-11-29 | Discharge: 2018-11-29 | Disposition: A | Discharge status: Home / Self Care | Payer: 59 | Source: Ambulatory Visit | Attending: Cardiology | Admitting: Cardiology

## 2018-11-29 DIAGNOSIS — Z9889 Other specified postprocedural states: Secondary | ICD-10-CM | POA: Insufficient documentation

## 2018-12-01 ENCOUNTER — Encounter (HOSPITAL_COMMUNITY)
Admission: RE | Admit: 2018-12-01 | Discharge: 2018-12-01 | Disposition: A | Discharge status: Home / Self Care | Payer: 59 | Source: Ambulatory Visit | Attending: Cardiology | Admitting: Cardiology

## 2018-12-01 ENCOUNTER — Other Ambulatory Visit: Payer: Self-pay

## 2018-12-01 DIAGNOSIS — Z9889 Other specified postprocedural states: Secondary | ICD-10-CM

## 2018-12-06 ENCOUNTER — Other Ambulatory Visit: Payer: Self-pay

## 2018-12-06 ENCOUNTER — Encounter (HOSPITAL_COMMUNITY)
Admission: RE | Admit: 2018-12-06 | Discharge: 2018-12-06 | Disposition: A | Discharge status: Home / Self Care | Payer: 59 | Source: Ambulatory Visit | Attending: Cardiology | Admitting: Cardiology

## 2018-12-06 DIAGNOSIS — Z9889 Other specified postprocedural states: Secondary | ICD-10-CM

## 2018-12-08 ENCOUNTER — Encounter (HOSPITAL_COMMUNITY)
Admission: RE | Admit: 2018-12-08 | Discharge: 2018-12-08 | Disposition: A | Discharge status: Home / Self Care | Payer: 59 | Source: Ambulatory Visit | Attending: Cardiology | Admitting: Cardiology

## 2018-12-08 ENCOUNTER — Other Ambulatory Visit: Payer: Self-pay

## 2018-12-08 VITALS — BP 116/72 | HR 85 | Temp 97.9°F | Ht 72.0 in | Wt 208.8 lb

## 2018-12-08 DIAGNOSIS — Z9889 Other specified postprocedural states: Secondary | ICD-10-CM | POA: Diagnosis not present

## 2018-12-11 ENCOUNTER — Encounter: Payer: Self-pay | Admitting: Thoracic Surgery (Cardiothoracic Vascular Surgery)

## 2018-12-11 ENCOUNTER — Ambulatory Visit (INDEPENDENT_AMBULATORY_CARE_PROVIDER_SITE_OTHER): Payer: Self-pay | Admitting: Thoracic Surgery (Cardiothoracic Vascular Surgery)

## 2018-12-11 ENCOUNTER — Other Ambulatory Visit: Payer: Self-pay

## 2018-12-11 VITALS — BP 122/86 | HR 89 | Temp 97.7°F | Resp 16 | Ht 72.0 in | Wt 204.0 lb

## 2018-12-11 DIAGNOSIS — Z9889 Other specified postprocedural states: Secondary | ICD-10-CM

## 2018-12-11 NOTE — Progress Notes (Signed)
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    His postoperative recovery was uneventful and he was last seen here in our office on October 16, 2018 at which time he was doing well.  He recently underwent routine follow-up echocardiogram and he returns to our office today for follow-up.  He reports that he is doing exceptionally well.  He has minimal residual soreness in his chest related to his sternotomy.  He has no shortness of breath.  He is quite active physically and he hopes to go back to work.  He specifically denies any symptoms of exertional shortness of breath or chest discomfort.   Current Outpatient Medications  Medication Sig Dispense Refill   aspirin EC 81 MG tablet Take 81 mg by mouth daily.     atorvastatin (LIPITOR) 80 MG tablet Take 1 tablet (80 mg total) by mouth daily at 6 PM. 90 tablet 3   calcium carbonate (TUMS - DOSED IN MG ELEMENTAL CALCIUM) 500 MG chewable tablet Chew 2 tablets by mouth daily as needed for indigestion or heartburn.     fluticasone (FLONASE) 50 MCG/ACT nasal spray Place 1 spray into both nostrils daily as needed for allergies or rhinitis.     gabapentin (NEURONTIN) 100 MG capsule Take 2 capsules (200 mg total) by mouth at bedtime. 180 capsule 1   loratadine (CLARITIN) 10 MG tablet Take 10 mg by mouth daily.       metoprolol tartrate (LOPRESSOR) 25 MG tablet Take 1 tablet (25 mg total) by mouth 2 (two) times daily. 180 tablet 2   Multiple Vitamin (MULTI-VITAMIN PO) Take 1 tablet by mouth daily.     nitroGLYCERIN (NITROSTAT) 0.4 MG SL tablet Place 1 tablet (0.4 mg total) under the tongue every 5 (five) minutes x 3 doses as needed for chest pain. 25 tablet 12   pantoprazole (PROTONIX) 40 MG tablet Take 1 tablet (40 mg total) by mouth 2 (two) times daily. 180 tablet 0   No current facility-administered medications for this visit.       Physical Exam:   BP 122/86 (BP Location: Right Arm, Patient Position: Sitting, Cuff Size: Normal)    Pulse 89    Temp 97.7 F (36.5 C)    Resp 16    Ht 6' (1.829 m)    Wt 204 lb (92.5 kg)    SpO2 95% Comment: RA   BMI 27.67 kg/m   General:  Well-appearing  Chest:   Clear to auscultation  CV:   Regular rate and rhythm without murmur  Incisions:  Well-healed, sternum is stable  Abdomen:  Soft nontender  Extremities:  Warm and well-perfused   Diagnostic Tests:    ECHOCARDIOGRAM REPORT       Patient Name:   ADAMA ADDIS Date of Exam: 11/14/2018 Medical Rec #:  [id number]  Height:       72.0 in Accession #:    [id number]       Weight:       208.0 lb Date of Birth:  Aug 24, 1965        BSA:          2.17 m Patient Age:    37 years         BP:           127/84 mmHg Patient Gender: M                HR:           73 bpm. Exam Location:  [facility]    Procedure: 2D Echo, Cardiac Doppler and Color Doppler  Indications:    I35.9   History:        Patient has prior history of Echocardiogram examinations, most                 recent 06/07/2018. Aortic Valve Disease and S/p aortic valve                 repair Risk Factors: Hypertension and Dyslipidemia.   Sonographer:    Coralyn Helling RDCS Referring Phys: [place]    1. The left ventricle has low normal systolic function, with an ejection fraction of 50-55%. The cavity  size was normal. There is moderately increased left ventricular wall thickness. Left ventricular diastolic Doppler parameters are consistent with  impaired relaxation.  2. The right ventricle has normal systolic function. The cavity was normal. There is no increase in right ventricular wall thicknss. Right ventricular systolic pressure could not be assessed.  3. Mild sclerosis of the aortic valve. S/P AV repair with 86mm annuloplasty ring. Aortic valve regurgitation is mild by color flow Doppler.  4. There is mild dilatation of the aortic root measuring 38 mm.  5. The interatrial septum appears to be lipomatous.  6. Compared to prior study, there is now an AV repair and LVF appears midly decreased from prior study.  FINDINGS  Left Ventricle: The left ventricle has low normal systolic function, with an ejection fraction of 50-55%. The cavity size was normal. There is moderately increased left ventricular wall thickness. Left ventricular diastolic Doppler parameters are  consistent with impaired relaxation. Normal left ventricular filling pressures  Right Ventricle: The right ventricle has normal systolic function. The cavity was normal. There is no increase in right ventricular wall thickness. Right ventricular systolic pressure could not be assessed.  Left Atrium: Left atrial size was normal in size.  Right Atrium: Right atrial size was normal in size.  Interatrial Septum: No atrial level shunt detected by color flow Doppler. Increased thickness of the atrial septum sparing the fossa ovalis consistent with The interatrial septum appears to be lipomatous.  Pericardium: There is no evidence of pericardial effusion.  Mitral Valve: The mitral valve is normal in structure. Mitral valve regurgitation was not assessed by color flow Doppler.  Tricuspid Valve: The tricuspid valve is normal in structure. Tricuspid valve regurgitation is trivial by color flow Doppler.  Aortic Valve: The  aortic valve has been repaired/replaced . S/P AV repair with 59mm annuloplasty ring. Mild sclerosis of the aortic valve. Aortic valve regurgitation is mild by color flow Doppler.  Pulmonic Valve: The pulmonic valve was normal in structure. Pulmonic valve regurgitation is mild by color flow Doppler.  Aorta: The aorta is abnormal unless otherwise noted. There is mild dilatation of  the aortic root measuring 38 mm.  Venous: The inferior vena cava measures 1.30 cm, is normal in size with greater than 50% respiratory variability.    +--------------+--------++  LEFT VENTRICLE            +----------------+---------++ +--------------+--------++  Diastology                    PLAX 2D                   +----------------+---------++ +--------------+--------++  LV e' lateral:   7.07 cm/s    LVIDd:         4.85 cm    +----------------+---------++ +--------------+--------++  LV E/e' lateral: 7.9          LVIDs:         3.55 cm    +----------------+---------++ +--------------+--------++  LV e' medial:    6.09 cm/s    LV PW:         1.30 cm    +----------------+---------++ +--------------+--------++  LV E/e' medial:  9.2          LV IVS:        1.30 cm    +----------------+---------++ +--------------+--------++  LVOT diam:     2.10 cm    +--------------+--------++  LV SV:         58 ml      +--------------+--------++  LV SV Index:   26.06      +--------------+--------++  LVOT Area:     3.46 cm   +--------------+--------++                            +--------------+--------++  +---------------+---------++  RIGHT VENTRICLE             +---------------+---------++  RV S prime:     9.57 cm/s   +---------------+---------++  TAPSE (M-mode): 1.7 cm      +---------------+---------++  +-------------+-------++-----------++  LEFT ATRIUM            Index         +-------------+-------++-----------++  LA diam:      3.50 cm  1.62 cm/m    +-------------+-------++-----------++  LA Vol  (A2C): 47.5 ml  21.93 ml/m   +-------------+-------++-----------++  LA Vol (A4C): 49.4 ml  22.80 ml/m   +-------------+-------++-----------++ +------------+---------++-----------++  RIGHT ATRIUM            Index         +------------+---------++-----------++  RA Pressure: 3.00 mmHg                +------------+---------++-----------++  RA Area:     17.20 cm                +------------+---------++-----------++  RA Volume:   47.30 ml   21.83 ml/m   +------------+---------++-----------++  +------------------+------------++  AORTIC VALVE                      +------------------+------------++  AV Area (Vmax):    0.93 cm       +------------------+------------++  AV Area (Vmean):   1.06 cm       +------------------+------------++  AV Area (VTI):     1.02 cm       +------------------+------------++  AV Vmax:           236.50 cm/s    +------------------+------------++  AV Vmean:          152.000 cm/s   +------------------+------------++  AV VTI:  0.447 m        +------------------+------------++  AV Peak Grad:      22.4 mmHg      +------------------+------------++  AV Mean Grad:      11.0 mmHg      +------------------+------------++  LVOT Vmax:         63.30 cm/s     +------------------+------------++  LVOT Vmean:        46.300 cm/s    +------------------+------------++  LVOT VTI:          0.132 m        +------------------+------------++  LVOT/AV VTI ratio: 0.30           +------------------+------------++  AR PHT:            722 msec       +------------------+------------++   +-------------+-------++  AORTA                   +-------------+-------++  Ao Root diam: 3.80 cm   +-------------+-------++  Ao Asc diam:  3.40 cm   +-------------+-------++  +--------------+--------++   +---------------+---------++  MITRAL VALVE                 TRICUSPID VALVE             +--------------+--------++   +---------------+---------++  MV Area (PHT):                Estimated RAP:  3.00 mmHg   +--------------+--------++   +---------------+---------++                            +--------------+--------++   +--------------+-------+  MV Decel Time: 415 msec      SHUNTS                  +--------------+--------++   +--------------+-------+ +--------------+----------++  Systemic VTI:  0.13 m    MV E velocity: 56.10 cm/s   +--------------+-------+ +--------------+----------++  Systemic Diam: 2.10 cm   MV A velocity: 85.70 cm/s   +--------------+-------+ +--------------+----------++  MV E/A ratio:  0.65         +--------------+----------++  +---------+-------+  IVC                +---------+-------+  IVC diam: 1.30 cm  +---------+-------+    [name] MD Electronically signed by [name] MD Signature Date/Time: 11/14/2018/11:26:15 AM      Impression:  Patient is doing very well approximately 3 months status post aortic valve repair.  I have personally reviewed the patient's recent follow-up echocardiogram which demonstrates intact valve repair with stable appearance of the annuloplasty ring and normal valve function.  There is mild central aortic insufficiency which is best seen on apical four-chamber views.  Mean transvalvular gradient across aortic valve was estimated 11 mmHg.  Left ventricular function appears normal.   Plan:  We have not recommended any change the patient's current medications.  I have encouraged the patient to continue his physical activity as tolerated without any particular limitations.  He may return to work without restriction.  The patient will continue to follow-up with Dr. Radford Pax.  We plan to see the patient back in follow-up next June, approximately 1 year following his surgery.  We recommend annual follow-up echocardiograms for the foreseeable future to assess stability of valve repair.     Valentina Gu. Roxy Manns, MD 12/11/2018 3:12 PM

## 2018-12-11 NOTE — Patient Instructions (Signed)
You may resume unrestricted physical activity without any particular limitations at this time.  Continue all previous medications without any changes at this time  Endocarditis is a potentially serious infection of heart valves or inside lining of the heart.  It occurs more commonly in patients with diseased heart valves (such as patient's with aortic or mitral valve disease) and in patients who have undergone heart valve repair or replacement.  Certain surgical and dental procedures may put you at risk, such as dental cleaning, other dental procedures, or any surgery involving the respiratory, urinary, gastrointestinal tract, gallbladder or prostate gland.   To minimize your chances for develooping endocarditis, maintain good oral health and seek prompt medical attention for any infections involving the mouth, teeth, gums, skin or urinary tract.    Always notify your doctor or dentist about your underlying heart valve condition before having any invasive procedures. You will need to take antibiotics before certain procedures, including all routine dental cleanings or other dental procedures.  Your cardiologist or dentist should prescribe these antibiotics for you to be taken ahead of time.      

## 2018-12-14 NOTE — Progress Notes (Signed)
Discharge Progress Report  Patient Details  Name: Isaac Patel MRN: [id number] Date of Birth: 07-27-1965 Referring Provider:     CARDIAC REHAB PHASE II ORIENTATION from 10/26/2018 in [facility]  Referring Provider  [name], MD       Number of Visits: 17  Reason for Discharge:  Patient reached a stable level of exercise. Patient independent in their exercise. Patient has met program and personal goals.  Smoking History:  Social History   Tobacco Use  Smoking Status Former Smoker  . Packs/day: 0.33  . Years: 32.00  . Pack years: 10.56  . Types: Cigarettes  . Quit date: 07/28/2018  . Years since quitting: 0.3  Smokeless Tobacco Never Used  Tobacco Comment   1-2 cigarettes in couple of days    Diagnosis:  S/P aortic valve repair  ADL [facility]SD:   Initial Exercise Prescription: Initial Exercise Prescription - 10/26/18 1100      Date of Initial Exercise RX and Referring Provider   Date  10/26/18    Referring Provider  [name], MD    Expected Discharge Date  12/08/18      Treadmill   MPH  3    Grade  2    Minutes  15    METs  4.12      NuStep   Level  3    SPM  85    Minutes  15    METs  3.5      Prescription Details   Frequency (times per week)  3x    Duration  Progress to 30 minutes of continuous aerobic without signs/symptoms of physical distress      Intensity   THRR 40-80% of Max Heartrate  67-134    Ratings of Perceived Exertion  11-13    Perceived Dyspnea  0-4      Progression   Progression  Continue progressive overload as per policy without signs/symptoms or physical distress.      Resistance Training   Training Prescription  Yes    Weight  5    Reps  10-15       Discharge Exercise Prescription (Final Exercise Prescription Changes): Exercise Prescription Changes - 12/08/18 0707      Response to Exercise   Blood Pressure (Admit)  116/72    Blood Pressure (Exercise)  128/64    Blood Pressure  (Exit)  116/72    Heart Rate (Admit)  85 bpm    Heart Rate (Exercise)  115 bpm    Heart Rate (Exit)  92 bpm    Rating of Perceived Exertion (Exercise)  13    Symptoms  none    Duration  Continue with 30 min of aerobic exercise without signs/symptoms of physical distress.    Intensity  THRR unchanged      Progression   Progression  Continue to progress workloads to maintain intensity without signs/symptoms of physical distress.    Average METs  4      Resistance Training   Training Prescription  Yes    Weight  5   5lbs right arm, 2lbs left arm   Reps  10-15    Time  10 Minutes      Interval Training   Interval Training  No      Treadmill   MPH  3    Grade  2    Minutes  15    METs  4.12      NuStep   Level  5  SPM  85    Minutes  15    METs  3.9      Home Exercise Plan   Plans to continue exercise at  Home (comment)   Treadmill at home, pool walking   Frequency  Add 3 additional days to program exercise sessions.    Initial Home Exercises Provided  11/10/18       Functional Capacity: 6 Minute Walk    Row Name 10/26/18 1053 12/01/18 0703       6 Minute Walk   Phase  Initial  Discharge    Distance  1722 feet  1925 feet    Distance % Change  -  11.79 %    Walk Time  6 minutes  6 minutes    # of Rest Breaks  0  0    MPH  3.26  3.65    METS  4.73  4.94    RPE  11  12    Perceived Dyspnea   0  0    VO2 Peak  16.54  17.3    Symptoms  No  No    Resting HR  78 bpm  76 bpm    Resting BP  122/80  110/68    Resting Oxygen Saturation   97 %  -    Exercise Oxygen Saturation  during 6 min walk  99 %  -    Max Ex. HR  115 bpm  107 bpm    Max Ex. BP  128/70  122/80    2 Minute Post BP  108/66  118/80       Psychological, QOL, Others - Outcomes: PHQ 2/9: Depression screen [facility] 2/9 11/07/2018 03/03/2015 12/09/2014  Decreased Interest 0 0 0  Down, Depressed, Hopeless 0 0 0  PHQ - 2 Score 0 0 0    Quality of Life: Quality of Life - 12/01/18 0953      Quality  of Life   Select  Quality of Life      Quality of Life Scores   Health/Function Pre  19.9 %    Health/Function Post  23.9 %    Health/Function % Change  20.1 %    Socioeconomic Pre  21.29 %    Socioeconomic Post  20.43 %    Socioeconomic % Change   -4.04 %    Psych/Spiritual Pre  20.64 %    Psych/Spiritual Post  22 %    Psych/Spiritual % Change  6.59 %    Family Pre  22.2 %    Family Post  22.5 %    Family % Change  1.35 %    GLOBAL Pre  20.68 %    GLOBAL Post  22.59 %    GLOBAL % Change  9.24 %       Personal Goals: Goals established at orientation with interventions provided to work toward goal. Personal Goals and Risk Factors at Admission - 10/26/18 1114      Core Components/Risk Factors/Patient Goals on Admission    Weight Management  Weight Loss    Hypertension  Yes    Intervention  Provide education on lifestyle modifcations including regular physical activity/exercise, weight management, moderate sodium restriction and increased consumption of fresh fruit, vegetables, and low fat dairy, alcohol moderation, and smoking cessation.;Monitor prescription use compliance.    Expected Outcomes  Short Term: Continued assessment and intervention until BP is < 140/65m HG in hypertensive participants. < 130/867mHG in hypertensive participants with diabetes, heart failure or chronic  kidney disease.;Long Term: Maintenance of blood pressure at goal levels.    Lipids  Yes    Intervention  Provide education and support for participant on nutrition & aerobic/resistive exercise along with prescribed medications to achieve LDL <77m, HDL >472m    Expected Outcomes  Short Term: Participant states understanding of desired cholesterol values and is compliant with medications prescribed. Participant is following exercise prescription and nutrition guidelines.;Long Term: Cholesterol controlled with medications as prescribed, with individualized exercise RX and with personalized nutrition plan.  Value goals: LDL < 7024mHDL > 40 mg.        Personal Goals Discharge: Goals and Risk Factor Review    Row Name 10/30/18 0730 11/13/18 0810 12/08/18 0726         Core Components/Risk Factors/Patient Goals Review   Personal Goals Review  Weight Management/Obesity;Hypertension;Lipids  Weight Management/Obesity;Hypertension;Lipids  Weight Management/Obesity;Hypertension;Lipids     Review  Pt with multiple CAD RFs willing to participate in CR exercise.  KenYvone Neuuld like to increase his strength.  30 Day ITP Review.  KenYvone Neuntinues to tolerate exercise well.  VSS.  He reports being able to sleep better.  Ken gradauted from [facility] [place] completed sessions.  He feels that increased his strength and build an exercise regimen.  He feels that his sleep continues to improve but still faces issues with falling asleep.     Expected Outcomes  Pt will continue to participate in CR exercise, nutrition, and lifestyle modification opportunities.  Pt will continue to participate in CR exercise, nutrition, and lifestyle modification opportunities.  Pt will continue to participate in CR exercise, nutrition, and lifestyle modification opportunities.  He plans to walk outside, use his treadmill, and pool for exercise.        Exercise Goals and Review: Exercise Goals    Row Name 10/26/18 1100             Exercise Goals   Increase Physical Activity  Yes       Intervention  Provide advice, education, support and counseling about physical activity/exercise needs.;Develop an individualized exercise prescription for aerobic and resistive training based on initial evaluation findings, risk stratification, comorbidities and participant's personal goals.       Expected Outcomes  Short Term: Attend rehab on a regular basis to increase amount of physical activity.;Long Term: Add in home exercise to make exercise part of routine and to increase amount of physical activity.;Long Term: Exercising regularly at least 3-5 days a  week.       Increase Strength and Stamina  Yes       Intervention  Provide advice, education, support and counseling about physical activity/exercise needs.;Develop an individualized exercise prescription for aerobic and resistive training based on initial evaluation findings, risk stratification, comorbidities and participant's personal goals.       Expected Outcomes  Short Term: Increase workloads from initial exercise prescription for resistance, speed, and METs.;Short Term: Perform resistance training exercises routinely during rehab and add in resistance training at home;Long Term: Improve cardiorespiratory fitness, muscular endurance and strength as measured by increased METs and functional capacity (6MWT)       Able to understand and use rate of perceived exertion (RPE) scale  Yes       Intervention  Provide education and explanation on how to use RPE scale       Expected Outcomes  Short Term: Able to use RPE daily in rehab to express subjective intensity level;Long Term:  Able to use RPE to guide intensity  level when exercising independently       Knowledge and understanding of Target Heart Rate Range (THRR)  Yes       Intervention  Provide education and explanation of THRR including how the numbers were predicted and where they are located for reference       Expected Outcomes  Short Term: Able to state/look up THRR;Long Term: Able to use THRR to govern intensity when exercising independently;Short Term: Able to use daily as guideline for intensity in rehab       Able to check pulse independently  Yes       Intervention  Provide education and demonstration on how to check pulse in carotid and radial arteries.;Review the importance of being able to check your own pulse for safety during independent exercise       Expected Outcomes  Short Term: Able to explain why pulse checking is important during independent exercise;Long Term: Able to check pulse independently and accurately        Understanding of Exercise Prescription  Yes       Intervention  Provide education, explanation, and written materials on patient's individual exercise prescription       Expected Outcomes  Short Term: Able to explain program exercise prescription;Long Term: Able to explain home exercise prescription to exercise independently          Exercise Goals Re-Evaluation: Exercise Goals Re-Evaluation    Row Name 10/30/18 0840 11/10/18 0738 12/06/18 0730 12/11/18 1031       Exercise Goal Re-Evaluation   Exercise Goals Review  Increase Physical Activity;Able to understand and use rate of perceived exertion (RPE) scale  Increase Physical Activity;Able to understand and use rate of perceived exertion (RPE) scale;Increase Strength and Stamina;Knowledge and understanding of Target Heart Rate Range (THRR);Understanding of Exercise Prescription  Increase Physical Activity;Able to understand and use rate of perceived exertion (RPE) scale;Increase Strength and Stamina;Knowledge and understanding of Target Heart Rate Range (THRR);Understanding of Exercise Prescription  Increase Physical Activity;Able to understand and use rate of perceived exertion (RPE) scale;Increase Strength and Stamina;Knowledge and understanding of Target Heart Rate Range (THRR);Understanding of Exercise Prescription    Comments  Patient able to understand and use RPE scale appropriately. Pt has a treadmill at home that he plans to use as his mode of home exercise.  Reviewed home exercise guidelines with patient including THRR, RPE scale, and endpoints for exercise. Pt is walking 30 minutes on his home TM 3 days/week. Pt feels he's making progress toward his goals. pt feels his strength is increasing, he's less fatigued, and he's been able to do some yard wrok.  Patient will complete the phase 2 cardiac rehab program on Friday 12/08/2018. Pt has progressed well achieving 4.4 METs with exercies. Pt is walking on his treadmill 30 minutes, 3-4 days/week  and is pool walking 15-20 minutes occassionally as well.  Pt completed the phase 2 cardiac rehab program and will continue home exercise routine walking on his treadmill 30 minutes, 3-4 days/week and is pool walking 15-20 minutes occassionally as well.    Expected Outcomes  Increase workloads as tolerated to help achieve personal health and fitness goals.  Pt will continue exercise at least 30 minutes, 3 days/week at home in addition to exercise at cardiac rehab to help achieve personal health and fitness goals.  Patient will continue exercise routine at least 30 minutes, 3-4 days/week to maintain health and fitness gains.  Patient will continue exercise routine at least 30 minutes, 3-4 days/week to maintain  health and fitness gains.       Nutrition & Weight - Outcomes: Pre Biometrics - 10/26/18 1059      Pre Biometrics   Height  6' (1.829 m)    Weight  93.9 kg    Waist Circumference  40.5 inches    Hip Circumference  41.5 inches    Waist to Hip Ratio  0.98 %    BMI (Calculated)  28.07    Triceps Skinfold  32 mm    % Body Fat  30.2 %    Grip Strength  46 kg    Flexibility  11 in    Single Leg Stand  30 seconds      Post Biometrics - 12/08/18 0707       Post  Biometrics   Height  6' (1.829 m)    Weight  94.7 kg    Waist Circumference  39.5 inches    Hip Circumference  41 inches    Waist to Hip Ratio  0.96 %    BMI (Calculated)  28.31    Triceps Skinfold  22 mm    % Body Fat  28.2 %    Grip Strength  43.5 kg    Flexibility  11 in    Single Leg Stand  30 seconds       Nutrition:   Nutrition Discharge:   Education Questionnaire Score: Knowledge Questionnaire Score - 12/01/18 0954      Knowledge Questionnaire Score   Pre Score  24/24    Post Score  23/24       Goals reviewed with patient; copy given to patient.

## 2019-01-03 ENCOUNTER — Ambulatory Visit: Payer: 59 | Admitting: Physician Assistant

## 2019-01-03 ENCOUNTER — Encounter: Payer: Self-pay | Admitting: Physician Assistant

## 2019-01-03 ENCOUNTER — Other Ambulatory Visit: Payer: Self-pay

## 2019-01-03 VITALS — BP 118/60 | HR 70 | Ht 72.0 in | Wt 209.8 lb

## 2019-01-03 DIAGNOSIS — Z9889 Other specified postprocedural states: Secondary | ICD-10-CM

## 2019-01-03 DIAGNOSIS — E785 Hyperlipidemia, unspecified: Secondary | ICD-10-CM

## 2019-01-03 DIAGNOSIS — I351 Nonrheumatic aortic (valve) insufficiency: Secondary | ICD-10-CM | POA: Diagnosis not present

## 2019-01-03 DIAGNOSIS — I2510 Atherosclerotic heart disease of native coronary artery without angina pectoris: Secondary | ICD-10-CM

## 2019-01-03 DIAGNOSIS — I10 Essential (primary) hypertension: Secondary | ICD-10-CM | POA: Diagnosis not present

## 2019-01-03 MED ORDER — METOPROLOL TARTRATE 25 MG PO TABS: 25.0000 mg | ORAL_TABLET | Freq: Two times a day (BID) | ORAL | 3 refills | Status: DC

## 2019-01-03 MED ORDER — ATORVASTATIN CALCIUM 80 MG PO TABS: 80.0000 mg | ORAL_TABLET | Freq: Every day | ORAL | 3 refills | Status: DC

## 2019-01-03 MED ORDER — NITROGLYCERIN 0.4 MG SL SUBL: 0.4000 mg | SUBLINGUAL_TABLET | SUBLINGUAL | 3 refills | Status: DC | PRN

## 2019-01-03 NOTE — Patient Instructions (Signed)
Medication Instructions:   Your physician recommends that you continue on your current medications as directed. Please refer to the Current Medication list given to you today.  If you need a refill on your cardiac medications before your next appointment, please call your pharmacy.   Lab work:  None ordered today  Testing/Procedures:  None ordered today  Follow-Up: At [facility], you and your health needs are our priority.  As part of our continuing mission to provide you with exceptional heart care, we have created designated Provider Care Teams.  These Care Teams include your primary Cardiologist (physician) and Advanced Practice Providers (APPs -  Physician Assistants and Nurse Practitioners) who all work together to provide you with the care you need, when you need it. You will need a follow up appointment in 12 months.  Please call our office 2 months in advance to schedule this appointment.  You may see [name], MD or one of the following Advanced Practice Providers on your designated Care Team:   [place], PA-C Melina Copa, PA-C . Ermalinda Barrios, PA-C

## 2019-01-03 NOTE — Progress Notes (Signed)
Cardiology Office Note:    Date:  01/03/2019   ID:  [name], DOB 08-10-1965, MRN [id number]  PCP:  Biagio Borg, MD  Cardiologist:  [name], MD / Richardson Dopp, PA-C  Electrophysiologist:  None   Referring MD: Biagio Borg, MD   Chief Complaint  Patient presents with  . Follow-up    CAD, aortic insufficiency s/p AV repair    History of Present Illness:    Isaac Patel is a 53 y.o. male with:   Coronary artery disease  ? S/p NSTEMI 10/2014 >> PCI:  DES to prox LAD ? Aten 2018 - Patent LAD stent ? LHC 08/11/2018:  LAD stent patent  Aortic insufficiency ? S/p Aortic Valve repair 08/2018 (HAART Aortic Ring Annuloplasty size 21 mm)  Hypertension  Hyperlipidemia  OSA  Traumatic amputation of L upper ext  Isaac Patel was last seen in July 2020.  Follow-up echo demonstrated normally functioning aortic valve repair and normal LV function.  Isaac Patel returns for follow-up.  Isaac Patel is here alone.  Since last seen, Isaac Patel has done well.  Isaac Patel feels that his energy is improving.  Isaac Patel still has a little bit of chest soreness.  However, this is improving.  Isaac Patel has not had shortness of breath, leg swelling, syncope.    Prior CV studies:   The following studies were reviewed today:  Echocardiogram 11/14/2018 EF 50-55, moderate LVH, impaired relaxation (Gr 1 DD), normal RV SF, aortic valve repair with mild AI, mean aortic valve gradient 11 mmHg, mild dilation of aortic root (38 mm)  Carotid US 09/13/2018 bilateral ICA 1-39  Cardiac catheterization 08/11/2018 LAD stent patent LCx 10  Transesophageal echocardiogram 06/08/2018 EF 50-55, moderately dilated, normal RV SF, severe AI, mild plaque descending aorta  Echocardiogram 06/07/2018 EF 55-60, moderately dilated, normal RV SF, mild MAC, moderate to severe AI  LHC 05/25/16 LAD prox stent ok LCx irregs RCA normal EF 55  Echo 10/30/14 Moderate LVH, EF 55-60%, normal wall motion, normal diastolic function, normal RV function   LHC 10/29/14 LAD: Proximal-mid 80% EF 50-55% with small focal region of mild mid anterolateral hypokinesis PCI: 3 x 15 mm resolute DES to the LAD  Past Medical History:  Diagnosis Date  . Aortic insufficiency    Status post aortic valve repair // Echo 8/20: EF 50-55, moderate LVH, grade 1 diastolic dysfunction, normal RV SF, status post AV repair, mild AI, mild dilation of aortic root (38 mm)  . CAD (coronary artery disease)    a.  Non-STEMI 8/16: LHC-proximal LAD 80% treated with a resolute DES, EF 50-55%;  b.  Echo 8/16:  Moderate LVH, EF 55-60%, normal wall motion, normal diastolic function, normal RV function  . CHF (congestive heart failure) ([place])   . Dyspnea    mild  . GERD (gastroesophageal reflux disease)   . Headache    "maybe monthly" (10/28/2014)  . HLD (hyperlipidemia)   . Hypertension   . NSTEMI (non-ST elevated myocardial infarction) ([place]) 10/28/2014  . OSA (obstructive sleep apnea)    "tried mask; wear it off and on" (10/28/2014)  . S/P aortic valve repair 09/15/2018   Complex valvuloplasty including plication of prolapsing right coronary cusp with Biostable HAART 300 aortic annuloplasty, size 21 mm   Surgical Hx: The patient  has a past surgical history that includes Arm amputation through forearm (Left, 2009); Tympanostomy tube placement (Bilateral, "as a kid"); Cholesteatoma excision (Right, 1990's); Cardiac catheterization (N/A, 10/29/2014); LEFT HEART CATH AND CORONARY ANGIOGRAPHY (N/A, 05/25/2016); Wisdom tooth  extraction; Nasal septoplasty w/ turbinoplasty (Bilateral, 11/30/2017); TEE without cardioversion (N/A, 06/08/2018); RIGHT/LEFT HEART CATH AND CORONARY ANGIOGRAPHY (N/A, 08/11/2018); Multiple tooth extractions (08/29/2018); Aortic valve repair (N/A, 09/15/2018); and TEE without cardioversion (N/A, 09/15/2018).   Current Medications: Current Meds  Medication Sig  . aspirin EC 81 MG tablet Take 81 mg by mouth daily.  Marland Kitchen atorvastatin (LIPITOR) 80 MG tablet Take 1 tablet (80 mg  total) by mouth daily at 6 PM.  . calcium carbonate (TUMS - DOSED IN MG ELEMENTAL CALCIUM) 500 MG chewable tablet Chew 2 tablets by mouth daily as needed for indigestion or heartburn.  . fluticasone (FLONASE) 50 MCG/ACT nasal spray Place 1 spray into both nostrils daily as needed for allergies or rhinitis.  Marland Kitchen loratadine (CLARITIN) 10 MG tablet Take 10 mg by mouth daily.   . metoprolol tartrate (LOPRESSOR) 25 MG tablet Take 1 tablet (25 mg total) by mouth 2 (two) times daily.  . Multiple Vitamin (MULTI-VITAMIN PO) Take 1 tablet by mouth daily.  . nitroGLYCERIN (NITROSTAT) 0.4 MG SL tablet Place 1 tablet (0.4 mg total) under the tongue every 5 (five) minutes x 3 doses as needed for chest pain.  . pantoprazole (PROTONIX) 40 MG tablet Take 40 mg by mouth daily.  . [DISCONTINUED] atorvastatin (LIPITOR) 80 MG tablet Take 1 tablet (80 mg total) by mouth daily at 6 PM.  . [DISCONTINUED] metoprolol tartrate (LOPRESSOR) 25 MG tablet Take 1 tablet (25 mg total) by mouth 2 (two) times daily.  . [DISCONTINUED] nitroGLYCERIN (NITROSTAT) 0.4 MG SL tablet Place 1 tablet (0.4 mg total) under the tongue every 5 (five) minutes x 3 doses as needed for chest pain.     Allergies:   Morphine and related   Social History   Tobacco Use  . Smoking status: Former Smoker    Packs/day: 0.33    Years: 32.00    Pack years: 10.56    Types: Cigarettes    Quit date: 07/28/2018    Years since quitting: 0.4  . Smokeless tobacco: Never Used  . Tobacco comment: 1-2 cigarettes in couple of days  Substance Use Topics  . Alcohol use: No    Alcohol/week: 2.0 - 3.0 standard drinks    Types: 2 - 3 Standard drinks or equivalent per week    Frequency: Never    Comment: stopped 06/19/2015,   . Drug use: Not Currently    Types: Marijuana    Comment: 05/24/2016  "stopped marijuana in the early 2000's"     Family Hx: The patient's family history includes Bradycardia in his father; Diverticulitis in his mother; Healthy in his  father; Multiple sclerosis in his maternal grandfather; Skin cancer in his mother. There is no history of Colon cancer, Stomach cancer, Rectal cancer, Liver cancer, or Esophageal cancer.  ROS:   Please see the history of present illness.    ROS All other systems reviewed and are negative.   EKGs/Labs/Other Test Reviewed:    EKG:  EKG is not ordered today.  The ekg ordered today demonstrates n/a  Recent Labs: 09/08/2018: NT-Pro BNP 127 09/16/2018: Magnesium 2.5 11/07/2018: ALT 19; BUN 17; Creatinine, Ser 0.99; Hemoglobin 14.0; Platelets 283.0; Potassium 4.4; Sodium 141; TSH 0.71   Recent Lipid Panel Lab Results  Component Value Date/Time   CHOL 111 11/07/2018 10:50 AM   TRIG 121.0 11/07/2018 10:50 AM   HDL 35.40 (L) 11/07/2018 10:50 AM   CHOLHDL 3 11/07/2018 10:50 AM   LDLCALC 52 11/07/2018 10:50 AM    Physical Exam:  VS:  BP 118/60   Pulse 70   Ht 6' (1.829 m)   Wt 209 lb 12.8 oz (95.2 kg)   SpO2 97%   BMI 28.45 kg/m     Wt Readings from Last 3 Encounters:  01/03/19 209 lb 12.8 oz (95.2 kg)  12/11/18 204 lb (92.5 kg)  12/08/18 208 lb 12.4 oz (94.7 kg)     Physical Exam  Constitutional: Isaac Patel is oriented to person, place, and time. Isaac Patel appears well-developed and well-nourished. No distress.  HENT:  Head: Normocephalic and atraumatic.  Eyes: No scleral icterus.  Neck: No JVD present. No thyromegaly present.  Cardiovascular: Normal rate, regular rhythm, S1 normal and S2 normal.  Murmur heard.  Early systolic murmur is present with a grade of 2/6 at the upper right sternal border and upper left sternal border. Pulmonary/Chest: Effort normal and breath sounds normal. Isaac Patel has no rales.  Abdominal: Soft. There is no hepatomegaly.  Musculoskeletal:        General: Deformity (L Upper Ext prosthesis in place ) present. No edema.  Lymphadenopathy:    Isaac Patel has no cervical adenopathy.  Neurological: Isaac Patel is alert and oriented to person, place, and time.  Skin: Skin is warm and dry.   Psychiatric: Isaac Patel has a normal mood and affect.    ASSESSMENT & PLAN:    1. Severe aortic insufficiency 2. S/P aortic valve repair Isaac Patel is doing well.  Follow-up echocardiogram demonstrated well-functioning aortic valve repair.  Continue current therapy.  Isaac Patel understands the need for SBE prophylaxis.  3. Coronary artery disease involving native coronary artery of native heart without angina pectoris History of non-ST ovation myocardial infarction 2016 treated with DES to LAD.  His stent was patent prior to his aortic valve repair.  Isaac Patel is not having angina.  Continue aspirin, statin, beta-blocker.  4. Essential hypertension The patient's blood pressure is controlled on his current regimen.  Continue current therapy.   5. Hyperlipidemia, unspecified hyperlipidemia type LDL optimal on most recent lab work.  Continue current Rx.     Dispo:  Return in about 1 year (around 01/03/2020) for Routine Follow Up w/ Dr. Radford Pax, or Richardson Dopp, PA-C, in person.   Medication Adjustments/Labs and Tests Ordered: Current medicines are reviewed at length with the patient today.  Concerns regarding medicines are outlined above.  Tests Ordered: No orders of the defined types were placed in this encounter.  Medication Changes: Meds ordered this encounter  Medications  . atorvastatin (LIPITOR) 80 MG tablet    Sig: Take 1 tablet (80 mg total) by mouth daily at 6 PM.    Dispense:  90 tablet    Refill:  3  . metoprolol tartrate (LOPRESSOR) 25 MG tablet    Sig: Take 1 tablet (25 mg total) by mouth 2 (two) times daily.    Dispense:  180 tablet    Refill:  3  . nitroGLYCERIN (NITROSTAT) 0.4 MG SL tablet    Sig: Place 1 tablet (0.4 mg total) under the tongue every 5 (five) minutes x 3 doses as needed for chest pain.    Dispense:  25 tablet    Refill:  3    Signed, Richardson Dopp, PA-C  01/03/2019 11:24 AM    [facility] [address], [place], [place]  [address] Phone: [phone]; Fax: [phone]

## 2019-03-01 ENCOUNTER — Encounter: Payer: Self-pay | Admitting: Internal Medicine

## 2019-04-03 ENCOUNTER — Encounter: Payer: Self-pay | Admitting: Internal Medicine

## 2019-04-04 MED ORDER — PANTOPRAZOLE SODIUM 40 MG PO TBEC: 40.0000 mg | DELAYED_RELEASE_TABLET | Freq: Every day | ORAL | 1 refills | Status: DC

## 2019-08-09 ENCOUNTER — Encounter: Payer: Self-pay | Admitting: Internal Medicine

## 2019-08-09 ENCOUNTER — Other Ambulatory Visit: Payer: Self-pay

## 2019-08-09 ENCOUNTER — Other Ambulatory Visit (INDEPENDENT_AMBULATORY_CARE_PROVIDER_SITE_OTHER): Payer: 59

## 2019-08-09 ENCOUNTER — Ambulatory Visit: Payer: 59 | Admitting: Internal Medicine

## 2019-08-09 VITALS — BP 140/82 | HR 89 | Temp 98.2°F | Ht 72.0 in | Wt 207.0 lb

## 2019-08-09 DIAGNOSIS — F411 Generalized anxiety disorder: Secondary | ICD-10-CM

## 2019-08-09 DIAGNOSIS — R1013 Epigastric pain: Secondary | ICD-10-CM | POA: Diagnosis not present

## 2019-08-09 DIAGNOSIS — I10 Essential (primary) hypertension: Secondary | ICD-10-CM | POA: Diagnosis not present

## 2019-08-09 DIAGNOSIS — K2100 Gastro-esophageal reflux disease with esophagitis, without bleeding: Secondary | ICD-10-CM

## 2019-08-09 DIAGNOSIS — R079 Chest pain, unspecified: Secondary | ICD-10-CM

## 2019-08-09 DIAGNOSIS — R7303 Prediabetes: Secondary | ICD-10-CM

## 2019-08-09 LAB — CBC WITH DIFFERENTIAL/PLATELET
Basophils Absolute: 0.1 10*3/uL (ref 0.0–0.1)
Basophils Relative: 1 % (ref 0.0–3.0)
Eosinophils Absolute: 0.3 10*3/uL (ref 0.0–0.7)
Eosinophils Relative: 3.6 % (ref 0.0–5.0)
HCT: 44.4 % (ref 39.0–52.0)
Hemoglobin: 15.2 g/dL (ref 13.0–17.0)
Lymphocytes Relative: 23.8 % (ref 12.0–46.0)
Lymphs Abs: 1.9 10*3/uL (ref 0.7–4.0)
MCHC: 34.2 g/dL (ref 30.0–36.0)
MCV: 90.7 fl (ref 78.0–100.0)
Monocytes Absolute: 0.7 10*3/uL (ref 0.1–1.0)
Monocytes Relative: 8.8 % (ref 3.0–12.0)
Neutro Abs: 5.1 10*3/uL (ref 1.4–7.7)
Neutrophils Relative %: 62.8 % (ref 43.0–77.0)
Platelets: 236 10*3/uL (ref 150.0–400.0)
RBC: 4.9 Mil/uL (ref 4.22–5.81)
RDW: 15.3 % (ref 11.5–15.5)
WBC: 8.1 10*3/uL (ref 4.0–10.5)

## 2019-08-09 NOTE — Patient Instructions (Signed)
Ok to try the protonix at 40 mg twice per day for the next week  Please continue all other medications as before, and refills have been done if requested.  Please have the pharmacy call with any other refills you may need.  Please continue your efforts at being more active, low cholesterol diet, and weight control.  Please keep your appointments with your specialists as you may have planned  - Cardiology, Cardiothoracic, and Pulmonary  Please go to the XRAY Department in the first floor for the x-ray testing - at the [facility] site  Please go to the LAB at the blood drawing area for the tests to be done - at the [facility] will be contacted by phone if any changes need to be made immediately.  Otherwise, you will receive a letter about your results with an explanation, but please check with MyChart first.  Please remember to sign up for MyChart if you have not done so, as this will be important to you in the future with finding out test results, communicating by private email, and scheduling acute appointments online when needed.

## 2019-08-09 NOTE — Progress Notes (Signed)
Subjective:    [name], male    DOB: 09-30-1965, 54 y.o.   MRN: [id number]  HPI  Here to f/u with c/o cp/upper abd pain on awakening on cPAP today, sharp and dull, hard to other wise characterize, worse to move the left arm and shoulder, but also with vague 3-4 wks upper abd discomfort but Denies worsening reflux, dysphagia, n/v, bowel change or blood.   No other worsening sob, diaphoresis, n/v, palps or syncope.  Pt denies new neurological symptoms such as new headache, or facial or extremity weakness or numbness   Pt denies polydipsia, polyuria  Pt denies fever, wt loss, night sweats, loss of appetite, or other constitutional symptoms.  Has f/u dr turner may 25, dr Ricard Dillon 1 yr, and plans to make f/u appt with pulm soon Past Medical History:  Diagnosis Date  . Aortic insufficiency    Status post aortic valve repair // Echo 8/20: EF 50-55, moderate LVH, grade 1 diastolic dysfunction, normal RV SF, status post AV repair, mild AI, mild dilation of aortic root (38 mm)  . CAD (coronary artery disease)    a.  Non-STEMI 8/16: LHC-proximal LAD 80% treated with a resolute DES, EF 50-55%;  b.  Echo 8/16:  Moderate LVH, EF 55-60%, normal wall motion, normal diastolic function, normal RV function  . CHF (congestive heart failure) ([place])   . Dyspnea    mild  . GERD (gastroesophageal reflux disease)   . Headache    "maybe monthly" (10/28/2014)  . HLD (hyperlipidemia)   . Hypertension   . NSTEMI (non-ST elevated myocardial infarction) ([place]) 10/28/2014  . OSA (obstructive sleep apnea)    "tried mask; wear it off and on" (10/28/2014)  . 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 AORTIC VALVE REPAIR N/A 09/15/2018   Procedure: AORTIC VALVE REPAIR USING HAART 300 AORTIC ANNULOPLASTY DEVICE SIZE 21MM;  Surgeon:  Rexene Alberts, MD;  Location: [facility];  Service: Open Heart Surgery;  Laterality: N/A;  . 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 CARDIAC CATHETERIZATION N/A 10/29/2014   Procedure: Left Heart Cath and Coronary Angiography;  Surgeon: Troy Sine, MD;  Location: [facility];  Service: Cardiovascular;  Laterality: N/A;  . CHOLESTEATOMA EXCISION Right 1990's  . LEFT HEART CATH AND CORONARY ANGIOGRAPHY N/A 05/25/2016   Procedure: Left Heart Cath and Coronary Angiography;  Surgeon: Sherren Mocha, MD;  Location: [facility];  Service: Cardiovascular;  Laterality: N/A;  . MULTIPLE TOOTH EXTRACTIONS  08/29/2018  . NASAL SEPTOPLASTY W/ TURBINOPLASTY Bilateral 11/30/2017   Procedure: NASAL SEPTOPLASTY WITH TURBINATE REDUCTION;  Surgeon: Jerrell Belfast, MD;  Location: [facility];  Service: ENT;  Laterality: Bilateral;  . RIGHT/LEFT HEART CATH AND CORONARY ANGIOGRAPHY N/A 08/11/2018   Procedure: RIGHT/LEFT HEART CATH AND CORONARY ANGIOGRAPHY;  Surgeon: Burnell Blanks, MD;  Location: [facility];  Service: Cardiovascular;  Laterality: N/A;  . TEE WITHOUT CARDIOVERSION N/A 06/08/2018   Procedure: TRANSESOPHAGEAL ECHOCARDIOGRAM (TEE);  Surgeon: Buford Dresser, MD;  Location: [facility];  Service: Cardiovascular;  Laterality: N/A;  . TEE WITHOUT CARDIOVERSION N/A 09/15/2018   Procedure: TRANSESOPHAGEAL ECHOCARDIOGRAM (TEE);  Surgeon: Rexene Alberts, MD;  Location: [facility];  Service: Open Heart Surgery;  Laterality: N/A;  . TYMPANOSTOMY TUBE PLACEMENT Bilateral "as a kid"   "  had one done as an adult too"  . WISDOM TOOTH EXTRACTION      reports that he quit smoking about a year ago. His smoking use included cigarettes. He has a 10.56 pack-year smoking history. He has never used smokeless tobacco. He reports previous drug use. Drug: Marijuana. He reports that he does not drink alcohol. family history includes Bradycardia in his father; Diverticulitis in his  mother; Healthy in his father; Multiple sclerosis in his maternal grandfather; Skin cancer in his mother. Allergies  Allergen Reactions  . Morphine And Related Itching   Current Outpatient Medications on File Prior to Visit  Medication Sig Dispense Refill  . aspirin EC 81 MG tablet Take 81 mg by mouth daily.    Marland Kitchen atorvastatin (LIPITOR) 80 MG tablet Take 1 tablet (80 mg total) by mouth daily at 6 PM. 90 tablet 3  . calcium carbonate (TUMS - DOSED IN MG ELEMENTAL CALCIUM) 500 MG chewable tablet Chew 2 tablets by mouth daily as needed for indigestion or heartburn.    . fluticasone (FLONASE) 50 MCG/ACT nasal spray Place 1 spray into both nostrils daily as needed for allergies or rhinitis.    Marland Kitchen loratadine (CLARITIN) 10 MG tablet Take 10 mg by mouth daily.     . metoprolol tartrate (LOPRESSOR) 25 MG tablet Take 1 tablet (25 mg total) by mouth 2 (two) times daily. 180 tablet 3  . Multiple Vitamin (MULTI-VITAMIN PO) Take 1 tablet by mouth daily.    . nitroGLYCERIN (NITROSTAT) 0.4 MG SL tablet Place 1 tablet (0.4 mg total) under the tongue every 5 (five) minutes x 3 doses as needed for chest pain. 25 tablet 3  . pantoprazole (PROTONIX) 40 MG tablet Take 1 tablet (40 mg total) by mouth daily. 90 tablet 1   No current facility-administered medications on file prior to visit.   Review of Systems All otherwise neg per pt     Objective:   Physical Exam BP 140/82 (BP Location: Right Arm, Patient Position: Sitting, Cuff Size: Large)   Pulse 89   Temp 98.2 F (36.8 C) (Oral)   Ht 6' (1.829 m)   Wt 207 lb (93.9 kg)   SpO2 97%   BMI 28.07 kg/m  VS noted,  Constitutional: Pt appears in NAD HENT: Head: NCAT.  Right Ear: External ear normal.  Left Ear: External ear normal.  Eyes: . Pupils are equal, round, and reactive to light. Conjunctivae and EOM are normal Nose: without d/c or deformity Neck: Neck supple. Gross normal ROM Cardiovascular: Normal rate and regular rhythm.   Pulmonary/Chest:  Effort normal and breath sounds without rales or wheezing.  Abd:  Soft, NT, ND, + BS, no organomegaly Neurological: Pt is alert. At baseline orientation, motor grossly intact Skin: Skin is warm. No rashes, other new lesions, no LE edema Psychiatric: Pt behavior is normal without agitation  All otherwise neg per pt  Lab Results  Component Value Date   WBC 8.1 08/09/2019   HGB 15.2 08/09/2019   HCT 44.4 08/09/2019   PLT 236.0 08/09/2019   GLUCOSE 111 (H) 08/09/2019   CHOL 111 11/07/2018   TRIG 121.0 11/07/2018   HDL 35.40 (L) 11/07/2018   LDLCALC 52 11/07/2018   ALT 18 08/09/2019   AST 22 08/09/2019   NA 141 08/09/2019   K 4.3 08/09/2019   CL 108 08/09/2019   CREATININE 1.11 08/09/2019   BUN 28 (H) 08/09/2019   CO2 23 08/09/2019   TSH 0.71 11/07/2018   PSA 1.27 11/07/2018  INR 1.4 (H) 09/15/2018   HGBA1C 5.8 11/07/2018   ECG today I have personally interpreted - NSR with non specific st t wave changes    Assessment & Plan:

## 2019-08-10 ENCOUNTER — Encounter: Payer: Self-pay | Admitting: Internal Medicine

## 2019-08-10 ENCOUNTER — Other Ambulatory Visit: Payer: Self-pay | Admitting: Internal Medicine

## 2019-08-10 DIAGNOSIS — R3129 Other microscopic hematuria: Secondary | ICD-10-CM

## 2019-08-10 DIAGNOSIS — G4733 Obstructive sleep apnea (adult) (pediatric): Secondary | ICD-10-CM

## 2019-08-10 LAB — URINALYSIS, ROUTINE W REFLEX MICROSCOPIC
Bilirubin Urine: NEGATIVE
Ketones, ur: NEGATIVE
Leukocytes,Ua: NEGATIVE
Nitrite: NEGATIVE
Specific Gravity, Urine: >=1.03 — AB (ref 1.000–1.030)
Total Protein, Urine: NEGATIVE
Urine Glucose: NEGATIVE
Urobilinogen, UA: 0.2 (ref 0.0–1.0)
pH: 5 (ref 5.0–8.0)

## 2019-08-10 LAB — BASIC METABOLIC PANEL WITH GFR
BUN: 28 mg/dL — ABNORMAL HIGH (ref 6–23)
CO2: 23 meq/L (ref 19–32)
Calcium: 9.3 mg/dL (ref 8.4–10.5)
Chloride: 108 meq/L (ref 96–112)
Creatinine, Ser: 1.11 mg/dL (ref 0.40–1.50)
GFR: 68.86 mL/min
Glucose, Bld: 111 mg/dL — ABNORMAL HIGH (ref 70–99)
Potassium: 4.3 meq/L (ref 3.5–5.1)
Sodium: 141 meq/L (ref 135–145)

## 2019-08-10 LAB — HEPATIC FUNCTION PANEL
ALT: 18 U/L (ref 0–53)
AST: 22 U/L (ref 0–37)
Albumin: 4.4 g/dL (ref 3.5–5.2)
Alkaline Phosphatase: 75 U/L (ref 39–117)
Bilirubin, Direct: 0.1 mg/dL (ref 0.0–0.3)
Total Bilirubin: 0.4 mg/dL (ref 0.2–1.2)
Total Protein: 7 g/dL (ref 6.0–8.3)

## 2019-08-10 LAB — TROPONIN I (HIGH SENSITIVITY): High Sens Troponin I: 4 ng/L (ref 2–17)

## 2019-08-10 LAB — LIPASE: Lipase: 14 U/L (ref 11.0–59.0)

## 2019-08-11 ENCOUNTER — Encounter: Payer: Self-pay | Admitting: Internal Medicine

## 2019-08-11 NOTE — Assessment & Plan Note (Signed)
Mild situational,  Declines med tx change, to f/u any worsening symptoms or concerns

## 2019-08-11 NOTE — Assessment & Plan Note (Signed)
Exam benign, for labs today

## 2019-08-11 NOTE — Assessment & Plan Note (Addendum)
Etiology unclear but suspect msk, ecg reviewed, for labs including troponin, f/u card/cardiothoracic as planned  I spent 41 minutes in preparing to see the patient by review of recent labs, imaging and procedures, obtaining and reviewing separately obtained history, communicating with the patient and family or caregiver, ordering medications, tests or procedures, and documenting clinical information in the EHR including the differential Dx, treatment, and any further evaluation and other management of cp, epigatric pain/gerd, anxiety, preDM, htn,

## 2019-08-11 NOTE — Assessment & Plan Note (Signed)
stable overall by history and exam, recent data reviewed with pt, and pt to continue medical treatment as before,  to f/u any worsening symptoms or concerns  

## 2019-08-11 NOTE — Assessment & Plan Note (Signed)
Denies worsening reflux, cont ppi

## 2019-08-13 ENCOUNTER — Telehealth: Payer: Self-pay | Admitting: Internal Medicine

## 2019-08-13 ENCOUNTER — Other Ambulatory Visit: Payer: Self-pay | Admitting: Internal Medicine

## 2019-08-13 NOTE — Telephone Encounter (Signed)
Isaac Patel with [facility] called back and said to leave the orders the way that were, per CT tech- Wells Guiles.

## 2019-08-13 NOTE — Telephone Encounter (Signed)
    [facility] calling to request order for CT scan (5/18) be changed to with AND without contrast

## 2019-08-14 ENCOUNTER — Ambulatory Visit
Admission: RE | Admit: 2019-08-14 | Discharge: 2019-08-14 | Disposition: A | Discharge status: Home / Self Care | Payer: 59 | Source: Ambulatory Visit | Attending: Internal Medicine | Admitting: Internal Medicine

## 2019-08-14 DIAGNOSIS — R1013 Epigastric pain: Secondary | ICD-10-CM

## 2019-08-14 DIAGNOSIS — R079 Chest pain, unspecified: Secondary | ICD-10-CM | POA: Diagnosis not present

## 2019-08-14 DIAGNOSIS — F411 Generalized anxiety disorder: Secondary | ICD-10-CM

## 2019-08-14 DIAGNOSIS — R3129 Other microscopic hematuria: Secondary | ICD-10-CM

## 2019-08-14 DIAGNOSIS — N200 Calculus of kidney: Secondary | ICD-10-CM | POA: Diagnosis not present

## 2019-08-21 ENCOUNTER — Encounter: Payer: Self-pay | Admitting: Cardiology

## 2019-08-21 ENCOUNTER — Ambulatory Visit (INDEPENDENT_AMBULATORY_CARE_PROVIDER_SITE_OTHER): Payer: 59 | Admitting: Cardiology

## 2019-08-21 ENCOUNTER — Other Ambulatory Visit: Payer: Self-pay

## 2019-08-21 VITALS — BP 114/80 | HR 67 | Ht 71.0 in | Wt 208.8 lb

## 2019-08-21 DIAGNOSIS — I2510 Atherosclerotic heart disease of native coronary artery without angina pectoris: Secondary | ICD-10-CM

## 2019-08-21 DIAGNOSIS — I351 Nonrheumatic aortic (valve) insufficiency: Secondary | ICD-10-CM

## 2019-08-21 DIAGNOSIS — E7800 Pure hypercholesterolemia, unspecified: Secondary | ICD-10-CM

## 2019-08-21 DIAGNOSIS — G4733 Obstructive sleep apnea (adult) (pediatric): Secondary | ICD-10-CM

## 2019-08-21 DIAGNOSIS — I10 Essential (primary) hypertension: Secondary | ICD-10-CM

## 2019-08-21 NOTE — Progress Notes (Signed)
Cardiology Office Note:    Date:  08/21/2019   ID:  Isaac Patel, DOB 1965/12/19, MRN [id number]  PCP:  Biagio Borg, MD  Cardiologist:  [name], MD    Referring MD: Biagio Borg, MD   Chief Complaint  Patient presents with  . Coronary Artery Disease  . Hypertension  . Hyperlipidemia  . Aortic Insuffiency    History of Present Illness:    Isaac Patel is a 54 y.o. male  with CAD s/p NSTEMI in 8/16 tx with DES to the proximal LAD. Cardiac Catheterizationin 2/2018demonstrated a patent LAD stent. He was admitted 3/10-3/02/2019 with chest and abdominal pain.  He ruled out for ACS.  He was noted to have murmur on exam and an echocardiogram demonstrated moderate to severe aortic insufficiency.  Transesophageal echocardiogram confirmed severe aortic insufficiency.  His LV was dilated and he was felt to meet criteria for aortic valve replacement.    He was seen by Richardson Dopp, PA 06/16/2018 and was complaining of exertional fatigue with minimal SOB but no exertional CP.  He had had a URI a few weeks prior to that.  He was seen back again 08/04/2018 in televisit with complaints of SOB and chest pain along with abdominal fullness, orthopnea but no PND.  He also was complaining of LE edema.  He underwent Cath 08/11/2018 showing patent stent in the oLAD-pLAD and 10% pLCx with normal LV filling pressures.  He underwent AVR repair with HAART aortic ring annuloplasty 78m in June 2020 and had followup last fall with SRichardson Dopp PA and was doing well. Followup echo 10/2018 showed low normal LVF with mild AI and aortic root 376m    He is here today for followup and is doing well.  He denies any chest pain or pressure, SOB, DOE, PND, orthopnea, LE edema, dizziness, palpitations or syncope. He has had some problems with waking up in the am with a pinching pain in his thoracic area and in his arm and thinks it is related to cervical spine disease.  He saw his PCP and trop and EKG were normal.   He is going to see his PCP again to get films of his neck.  He has been noticing occasional floaters in his eyes that are transient. He is compliant with his meds and is tolerating meds with no SE.    Past Medical History:  Diagnosis Date  . Aortic insufficiency    Status post aortic valve repair // Echo 8/20: EF 50-55, moderate LVH, grade 1 diastolic dysfunction, normal RV SF, status post AV repair, mild AI, mild dilation of aortic root (38 mm)  . CAD (coronary artery disease)    a.  Non-STEMI 8/16: LHC-proximal LAD 80% treated with a resolute DES, EF 50-55%;  b.  Echo 8/16:  Moderate LVH, EF 55-60%, normal wall motion, normal diastolic function, normal RV function  . CHF (congestive heart failure) ([facility]  . Dyspnea    mild  . GERD (gastroesophageal reflux disease)   . Headache    "maybe monthly" (10/28/2014)  . HLD (hyperlipidemia)   . Hypertension   . NSTEMI (non-ST elevated myocardial infarction) ([facility]8/03/2014  . OSA (obstructive sleep apnea)    "tried mask; wear it off and on" (10/28/2014)  . S/P aortic valve repair 09/15/2018   Complex valvuloplasty including plication of prolapsing right coronary cusp with Biostable HAART 300 aortic annuloplasty, size 21 mm    Past Surgical History:  Procedure Laterality Date  . AORTIC VALVE REPAIR  N/A 09/15/2018   Procedure: AORTIC VALVE REPAIR USING HAART 300 AORTIC ANNULOPLASTY DEVICE SIZE 21MM;  Surgeon: Rexene Alberts, MD;  Location: [place];  Service: Open Heart Surgery;  Laterality: N/A;  . 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 CARDIAC CATHETERIZATION N/A 10/29/2014   Procedure: Left Heart Cath and Coronary Angiography;  Surgeon: Troy Sine, MD;  Location: [place] [facility];  Service: Cardiovascular;  Laterality: N/A;  . CHOLESTEATOMA EXCISION Right 1990's  . LEFT HEART CATH AND CORONARY ANGIOGRAPHY N/A 05/25/2016   Procedure: Left Heart Cath and Coronary Angiography;  Surgeon: Sherren Mocha, MD;  Location: [place] [facility];  Service: Cardiovascular;  Laterality: N/A;  . MULTIPLE TOOTH EXTRACTIONS  08/29/2018  . NASAL SEPTOPLASTY W/ TURBINOPLASTY Bilateral 11/30/2017   Procedure: NASAL SEPTOPLASTY WITH TURBINATE REDUCTION;  Surgeon: Jerrell Belfast, MD;  Location: [place];  Service: ENT;  Laterality: Bilateral;  . RIGHT/LEFT HEART CATH AND CORONARY ANGIOGRAPHY N/A 08/11/2018   Procedure: RIGHT/LEFT HEART CATH AND CORONARY ANGIOGRAPHY;  Surgeon: Burnell Blanks, MD;  Location: [place] [facility];  Service: Cardiovascular;  Laterality: N/A;  . TEE WITHOUT CARDIOVERSION N/A 06/08/2018   Procedure: TRANSESOPHAGEAL ECHOCARDIOGRAM (TEE);  Surgeon: Buford Dresser, MD;  Location: [facility];  Service: Cardiovascular;  Laterality: N/A;  . TEE WITHOUT CARDIOVERSION N/A 09/15/2018   Procedure: TRANSESOPHAGEAL ECHOCARDIOGRAM (TEE);  Surgeon: Rexene Alberts, MD;  Location: [place];  Service: Open Heart Surgery;  Laterality: N/A;  . TYMPANOSTOMY TUBE PLACEMENT Bilateral "as a kid"   "had one done as an adult too"  . WISDOM TOOTH EXTRACTION      Current Medications: Current Meds  Medication Sig  . aspirin EC 81 MG tablet Take 81 mg by mouth daily.  Marland Kitchen atorvastatin (LIPITOR) 80 MG tablet Take 1 tablet (80 mg total) by mouth daily at 6 PM.  . calcium carbonate (TUMS - DOSED IN MG ELEMENTAL CALCIUM) 500 MG chewable tablet Chew 2 tablets by mouth daily as needed for indigestion or heartburn.  . fluticasone (FLONASE) 50 MCG/ACT nasal spray Place 1 spray into both nostrils daily as needed for allergies or rhinitis.  Marland Kitchen loratadine (CLARITIN) 10 MG tablet Take 10 mg by mouth daily.   . metoprolol tartrate (LOPRESSOR) 25 MG tablet Take 1 tablet (25 mg total) by mouth 2 (two) times daily.  . Multiple Vitamin (MULTI-VITAMIN PO) Take 1 tablet by mouth daily.  . nitroGLYCERIN (NITROSTAT) 0.4 MG SL tablet Place 1 tablet (0.4 mg total) under the tongue every 5 (five) minutes x 3 doses as needed for chest pain.  .  pantoprazole (PROTONIX) 40 MG tablet Take 1 tablet (40 mg total) by mouth daily.     Allergies:   Morphine and related   Social History   Socioeconomic History  . Marital status: Married    Spouse name: Not on file  . Number of children: 2  . Years of education: 51  . Highest education level: Not on file  Occupational History  . Occupation: CMA  Tobacco Use  . Smoking status: Former Smoker    Packs/day: 0.33    Years: 32.00    Pack years: 10.56    Types: Cigarettes    Quit date: 07/28/2018    Years since quitting: 1.0  . Smokeless tobacco: Never Used  . Tobacco comment: 1-2 cigarettes in couple of days  Substance and Sexual Activity  . Alcohol use: No    Alcohol/week: 2.0 - 3.0 standard drinks  Types: 2 - 3 Standard drinks or equivalent per week    Comment: stopped 06/19/2015,   . Drug use: Not Currently    Types: Marijuana    Comment: 05/24/2016  "stopped marijuana in the early 2000's"  . Sexual activity: Yes  Other Topics Concern  . Not on file  Social History Narrative   Born and raised in [place], [place].  Currently resides in a house with his wife and children. 1 dog. Fun: play computer games, sports.    Denies any religious beliefs effecting health care.    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 Difficulty of Paying Living Expenses:   Food Insecurity:   . Worried About Charity fundraiser in the Last Year:   . Arboriculturist in the Last Year:   Transportation Needs:   . Film/video editor (Medical):   Marland Kitchen Lack of Transportation (Non-Medical):   Physical Activity:   . Days of Exercise per Week:   . Minutes of Exercise per Session:   Stress:   . Feeling of Stress :   Social Connections:   . Frequency of Communication with Friends and Family:   . Frequency of Social Gatherings with Friends and Family:   . Attends Religious Services:   . Active  Member of Clubs or Organizations:   . Attends Archivist Meetings:   Marland Kitchen Marital Status:      Family History: The patient's family history includes Bradycardia in his father; Diverticulitis in his mother; Healthy in his father; Multiple sclerosis in his maternal grandfather; Skin cancer in his mother. There is no history of Colon cancer, Stomach cancer, Rectal cancer, Liver cancer, or Esophageal cancer.  ROS:   Please see the history of present illness.    ROS  All other systems reviewed and negative.   EKGs/Labs/Other Studies Reviewed:    The following studies were reviewed today: Echocardiogram 11/14/2018 EF 50-55, moderate LVH, impaired relaxation (Gr 1 DD), normal RV SF, aortic valve repair with mild AI, mean aortic valve gradient 11 mmHg, mild dilation of aortic root (38 mm)  Carotid US 09/13/2018 bilateral ICA 1-39  Cardiac catheterization 08/11/2018 LAD stent patent LCx 10  Transesophageal echocardiogram 06/08/2018 EF 50-55, moderately dilated, normal RV SF, severe AI, mild plaque descending aorta  Echocardiogram 06/07/2018 EF 55-60, moderately dilated, normal RV SF, mild MAC, moderate to severe AI  LHC 05/25/16 LAD prox stent ok LCx irregs RCA normal EF 55  Echo 10/30/14 Moderate LVH, EF 55-60%, normal wall motion, normal diastolic function, normal RV function  LHC 10/29/14 LAD: Proximal-mid 80% EF 50-55% with small focal region of mild mid anterolateral hypokinesis PCI: 3 x 15 mm resolute DES to the LAD  EKG:  EKG is ordered today and showed NSR at 67bpm with no ST changes  Recent Labs: 09/08/2018: NT-Pro BNP 127 09/16/2018: Magnesium 2.5 11/07/2018: TSH 0.71 08/09/2019: ALT 18; BUN 28; Creatinine, Ser 1.11; Hemoglobin 15.2; Platelets 236.0; Potassium 4.3; Sodium 141   Recent Lipid Panel    Component Value Date/Time   CHOL 111 11/07/2018 1050   TRIG 121.0 11/07/2018 1050   HDL 35.40 (L) 11/07/2018 1050   CHOLHDL 3 11/07/2018 1050   VLDL 24.2  11/07/2018 1050   LDLCALC 52 11/07/2018 1050    Physical Exam:    VS:  BP 114/80   Pulse 67  Ht _0  (1.803 m)   Wt 208 lb 12.8 oz (94.7 kg)   BMI 29.12 kg/m     Wt Readings from Last 3 Encounters:  08/21/19 208 lb 12.8 oz (94.7 kg)  08/09/19 207 lb (93.9 kg)  01/03/19 209 lb 12.8 oz (95.2 kg)     GEN: Well nourished, well developed in no acute distress HEENT: Normal NECK: No JVD; No carotid bruits LYMPHATICS: No lymphadenopathy CARDIAC:RRR, no murmurs, rubs, gallops RESPIRATORY:  Clear to auscultation without rales, wheezing or rhonchi  ABDOMEN: Soft, non-tender, non-distended MUSCULOSKELETAL:  No edema; No deformity  SKIN: Warm and dry NEUROLOGIC:  Alert and oriented x 3 PSYCHIATRIC:  Normal affect    ASSESSMENT:    1. Coronary artery disease involving native coronary artery of native heart without angina pectoris   2. Severe aortic insufficiency   3. Essential hypertension   4. Pure hypercholesterolemia    PLAN:    In order of problems listed above:  1.  ASCAD -s/p NSTEMI in 8/16 tx with DES to the proximal LAD.  -s/p cath 07/2018 showed widely patent oLAD stent and 10% pLCX stenosis. -he denies any anginal sx -continue ASA 29m daily, BB and statin  2.  Severe AI -s/p Complex valvuloplasty including plication of prolapsing right coronary cusp with Biostable HAART 300 aortic annuloplasty, size 21 mm -followup echo 10/2018 showed mild AI and minimal AV gradient -discussed need for ongoing SBE prophylaxis with dental procedures. -he has had some episodes of transient bright floaters and then vision goes blurry -I will get a 2D echo to make sure valve looks ok.  Carotid dopplers showed mild stenosis a year ago and he is on ASA -refer to opthalmology  3.  Hypertension -BP is well controlled -continue on Lopressor 263mBID  4.  OSA - This is followed by Pulmonary  5  Hyperlipidemia -LDL goal < 70 -check FLP and ALT in Aug 2021 -he will continue on  atorvastatin 809maily.    Medication Adjustments/Labs and Tests Ordered: Current medicines are reviewed at length with the patient today.  Concerns regarding medicines are outlined above.  No orders of the defined types were placed in this encounter.  No orders of the defined types were placed in this encounter.   Signed, TraFransico HimD  08/21/2019 3:46 PM    [facility]

## 2019-08-21 NOTE — Addendum Note (Signed)
Addended by: Antonieta Iba on: 08/21/2019 04:03 PM   Modules accepted: Orders

## 2019-08-21 NOTE — Patient Instructions (Signed)
Medication Instructions:  Your physician recommends that you continue on your current medications as directed. Please refer to the Current Medication list given to you today.  *If you need a refill on your cardiac medications before your next appointment, please call your pharmacy*   Lab Work: Fasting lipids and ALT in August. If you have labs (blood work) drawn today and your tests are completely normal, you will receive your results only by: Marland Kitchen MyChart Message (if you have MyChart) OR . A paper copy in the mail If you have any lab test that is abnormal or we need to change your treatment, we will call you to review the results.   Testing/Procedures: Your physician has requested that you have an echocardiogram. Echocardiography is a painless test that uses sound waves to create images of your heart. It provides your doctor with information about the size and shape of your heart and how well your heart's chambers and valves are working. This procedure takes approximately one hour. There are no restrictions for this procedure.  Follow-Up: At [facility], you and your health needs are our priority.  As part of our continuing mission to provide you with exceptional heart care, we have created designated Provider Care Teams.  These Care Teams include your primary Cardiologist (physician) and Advanced Practice Providers (APPs -  Physician Assistants and Nurse Practitioners) who all work together to provide you with the care you need, when you need it.  We recommend signing up for the patient portal called "MyChart".  Sign up information is provided on this After Visit Summary.  MyChart is used to connect with patients for Virtual Visits (Telemedicine).  Patients are able to view lab/test results, encounter notes, upcoming appointments, etc.  Non-urgent messages can be sent to your provider as well.   To learn more about what you can do with MyChart, go to [email or web address].    Your next  appointment:   6 month(s)  The format for your next appointment:   In Person  Provider:   You may see [name], MD or one of the following Advanced Practice Providers on your designated Care Team:    Melina Copa, PA-C  Ermalinda Barrios, PA-C   You have been referred to opthalmology.

## 2019-08-22 NOTE — Addendum Note (Signed)
Addended by: Antonieta Iba on: 08/22/2019 12:01 PM   Modules accepted: Orders

## 2019-08-23 NOTE — Addendum Note (Signed)
Addended by: Patterson Hammersmith A on: 08/23/2019 07:57 AM   Modules accepted: Orders

## 2019-08-28 ENCOUNTER — Other Ambulatory Visit: Payer: Self-pay

## 2019-08-28 ENCOUNTER — Ambulatory Visit: Payer: 59 | Admitting: Adult Health

## 2019-08-28 ENCOUNTER — Encounter: Payer: Self-pay | Admitting: Adult Health

## 2019-08-28 DIAGNOSIS — G4733 Obstructive sleep apnea (adult) (pediatric): Secondary | ICD-10-CM

## 2019-08-28 NOTE — Patient Instructions (Addendum)
Continue on CPAP At bedtime   Change to dream wear full face mask. -call back if this mask works and we will send Rx to DME .  Work on healthy weight .  Do not drive if sleepy.  Follow up with Dr. Elsworth Soho  In 1 year and As needed

## 2019-08-28 NOTE — Assessment & Plan Note (Signed)
Excellent control and compliance on nocturnal CPAP  Plan  Patient Instructions  Continue on CPAP At bedtime   Change to dream wear full face mask. -call back if this mask works and we will send Rx to DME .  Work on healthy weight .  Do not drive if sleepy.  Follow up with Dr. Elsworth Soho  In 1 year and As needed

## 2019-08-28 NOTE — Progress Notes (Signed)
@Patient  ID: Isaac Patel, male    DOB: [birth year], 54 y.o.   MRN: [id number]  Chief Complaint  Patient presents with  . Follow-up    OSA    Referring provider: Biagio Borg, MD  HPI: 54 year old male followed for obstructive sleep apnea on nocturnal CPAP Medical history significant for CAD s/p CABG   TEST/EVENTS :   05/2011 HST - AHI 38/h  08/28/2019 Follow up : OSA  Patient presents for a follow-up visit for obstructive sleep apnea.  He was last seen September 2018. Has 2 CPAP machines  that he use 1 machine  during week and 1 on the weekend.  Wants new order for supplies.  Feels that the CPAP mask is causing him to sleep funny in bed so that the mask does not leak. Patient feels rested with no significant daytime sleepiness. CPAP download shows excellent compliance with daily average usage at 6.5 hours.  Patient is on auto CPAP 5 to 15 cm H2O.  AHI 5.7.     Allergies  Allergen Reactions  . Morphine And Related Itching    Immunization History  Administered Date(s) Administered  . Influenza,inj,Quad PF,6+ Mos 12/27/2017  . Influenza-Unspecified 01/06/2015  . PFIZER SARS-COV-2 Vaccination 05/13/2019, 05/30/2019  . Tdap 09/21/2017    Past Medical History:  Diagnosis Date  . Aortic insufficiency    Status post aortic valve repair // Echo 8/20: EF 50-55, moderate LVH, grade 1 diastolic dysfunction, normal RV SF, status post AV repair, mild AI, mild dilation of aortic root (38 mm)  . CAD (coronary artery disease)    a.  Non-STEMI 8/16: LHC-proximal LAD 80% treated with a resolute DES, EF 50-55%;  b.  Echo 8/16:  Moderate LVH, EF 55-60%, normal wall motion, normal diastolic function, normal RV function  . CHF (congestive heart failure) ([place])   . Dyspnea    mild  . GERD (gastroesophageal reflux disease)   . Headache    "maybe monthly" (10/28/2014)  . HLD (hyperlipidemia)   . Hypertension   . NSTEMI (non-ST elevated myocardial infarction) ([place]) 10/28/2014  . OSA  (obstructive sleep apnea)    "tried mask; wear it off and on" (10/28/2014)  . S/P aortic valve repair 09/15/2018   Complex valvuloplasty including plication of prolapsing right coronary cusp with Biostable HAART 300 aortic annuloplasty, size 21 mm    Tobacco History: Social History   Tobacco Use  Smoking Status Current Every Day Smoker  . Packs/day: 0.33  . Years: 32.00  . Pack years: 10.56  . Types: Cigarettes  . Last attempt to quit: 07/28/2018  . Years since quitting: 1.0  Smokeless Tobacco Never Used  Tobacco Comment   6 cigarettes per day 08/28/19 [facility]    Ready to quit: Not Answered Counseling given: Not Answered Comment: 6 cigarettes per day 08/28/19 [facility]    Outpatient Medications Prior to Visit  Medication Sig Dispense Refill  . aspirin EC 81 MG tablet Take 81 mg by mouth daily.    Marland Kitchen atorvastatin (LIPITOR) 80 MG tablet Take 1 tablet (80 mg total) by mouth daily at 6 PM. 90 tablet 3  . calcium carbonate (TUMS - DOSED IN MG ELEMENTAL CALCIUM) 500 MG chewable tablet Chew 2 tablets by mouth daily as needed for indigestion or heartburn.    . fluticasone (FLONASE) 50 MCG/ACT nasal spray Place 1 spray into both nostrils daily as needed for allergies or rhinitis.    Marland Kitchen loratadine (CLARITIN) 10 MG tablet Take 10 mg by mouth daily.     Marland Kitchen  metoprolol tartrate (LOPRESSOR) 25 MG tablet Take 1 tablet (25 mg total) by mouth 2 (two) times daily. 180 tablet 3  . Multiple Vitamin (MULTI-VITAMIN PO) Take 1 tablet by mouth daily.    . nitroGLYCERIN (NITROSTAT) 0.4 MG SL tablet Place 1 tablet (0.4 mg total) under the tongue every 5 (five) minutes x 3 doses as needed for chest pain. 25 tablet 3  . pantoprazole (PROTONIX) 40 MG tablet Take 1 tablet (40 mg total) by mouth daily. 90 tablet 1   No facility-administered medications prior to visit.     Review of Systems:   Constitutional:   No  weight loss, night sweats,  Fevers, chills, fatigue, or  lassitude.  HEENT:   No headaches,  Difficulty  swallowing,  Tooth/dental problems, or  Sore throat,                No sneezing, itching, ear ache, nasal congestion, post nasal drip,   CV:  No chest pain,  Orthopnea, PND, swelling in lower extremities, anasarca, dizziness, palpitations, syncope.   GI  No heartburn, indigestion, abdominal pain, nausea, vomiting, diarrhea, change in bowel habits, loss of appetite, bloody stools.   Resp: No shortness of breath with exertion or at rest.  No excess mucus, no productive cough,  No non-productive cough,  No coughing up of blood.  No change in color of mucus.  No wheezing.  No chest wall deformity  Skin: no rash or lesions.  GU: no dysuria, change in color of urine, no urgency or frequency.  No flank pain, no hematuria   MS:  No joint pain or swelling.  No decreased range of motion.  No back pain.    Physical Exam  BP 112/66 (BP Location: Right Arm, Cuff Size: Normal)   Pulse 92   Temp 98 F (36.7 C) (Oral)   Ht 5\' 11"  (1.803 m)   Wt 205 lb 12.8 oz (93.4 kg)   SpO2 96%   BMI 28.70 kg/m   GEN: A/Ox3; pleasant , NAD, well nourished    HEENT:  [place]/AT,  EACs-clear, TMs-wnl, NOSE-clear, THROAT-clear, no lesions, no postnasal drip or exudate noted.   NECK:  Supple w/ fair ROM; no JVD; normal carotid impulses w/o bruits; no thyromegaly or nodules palpated; no lymphadenopathy.    RESP  Clear  P & A; w/o, wheezes/ rales/ or rhonchi. no accessory muscle use, no dullness to percussion  CARD:  RRR, no m/r/g, no peripheral edema, pulses intact, no cyanosis or clubbing.  GI:   Soft & nt; nml bowel sounds; no organomegaly or masses detected.   Musco: Warm bil, no deformities or joint swelling noted.   Neuro: alert, no focal deficits noted.    Skin: Warm, no lesions or rashes    Lab Results:   BNP No results found for: BNP      No flowsheet data found.  No results found for: NITRICOXIDE      Assessment & Plan:   Obstructive sleep apnea Excellent control and compliance  on nocturnal CPAP  Plan  Patient Instructions  Continue on CPAP At bedtime   Change to dream wear full face mask. -call back if this mask works and we will send Rx to DME .  Work on healthy weight .  Do not drive if sleepy.  Follow up with Dr. Elsworth Soho  In 1 year and As needed           Rexene Edison, NP 08/28/2019

## 2019-09-09 IMAGING — DX CHEST - 2 VIEW
2 series · 2 of 2 positions shown · non-contrast
Comparison: None.

CLINICAL DATA: Mid chest pain.

EXAM:
CHEST - 2 VIEW

[w chest pa]
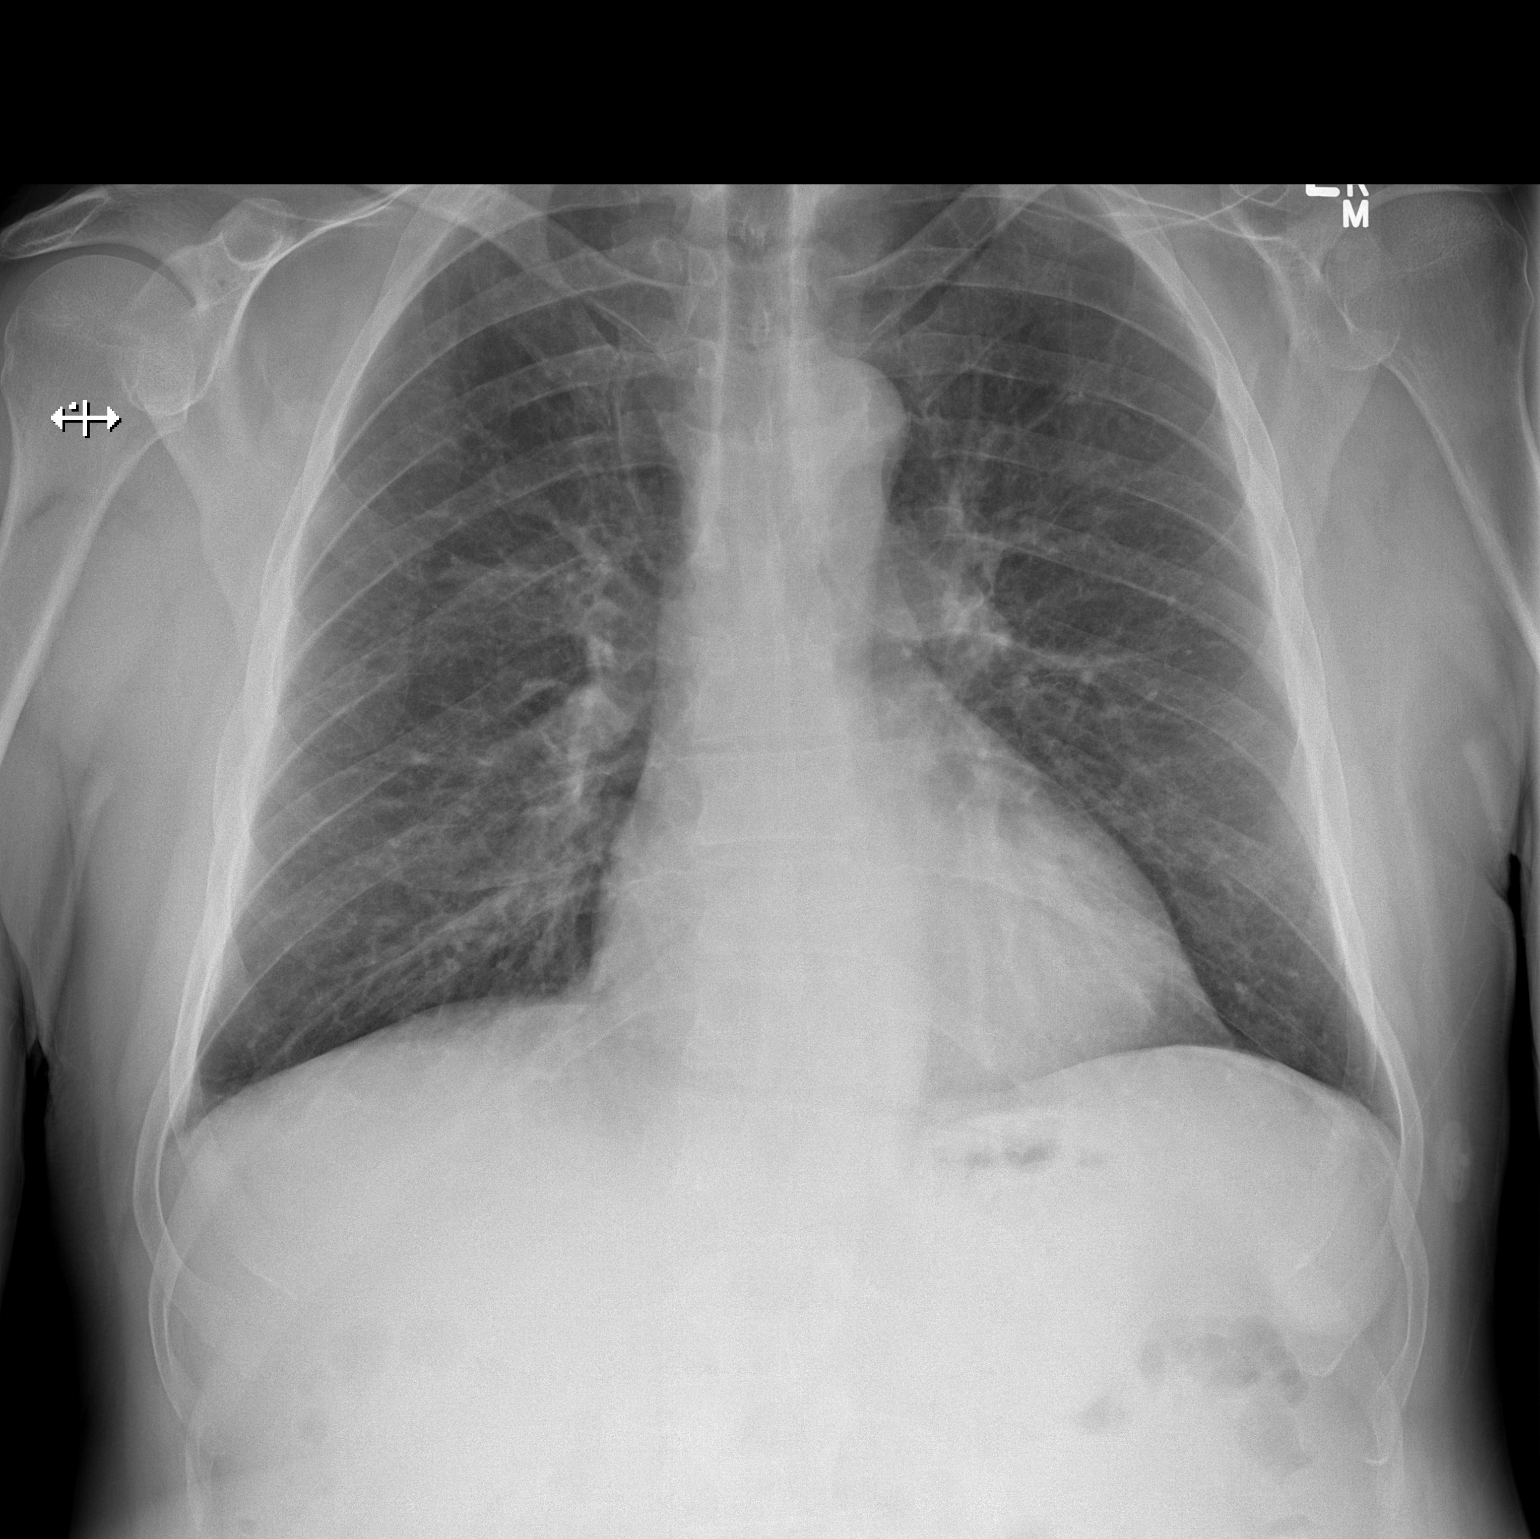

[w chest lat]
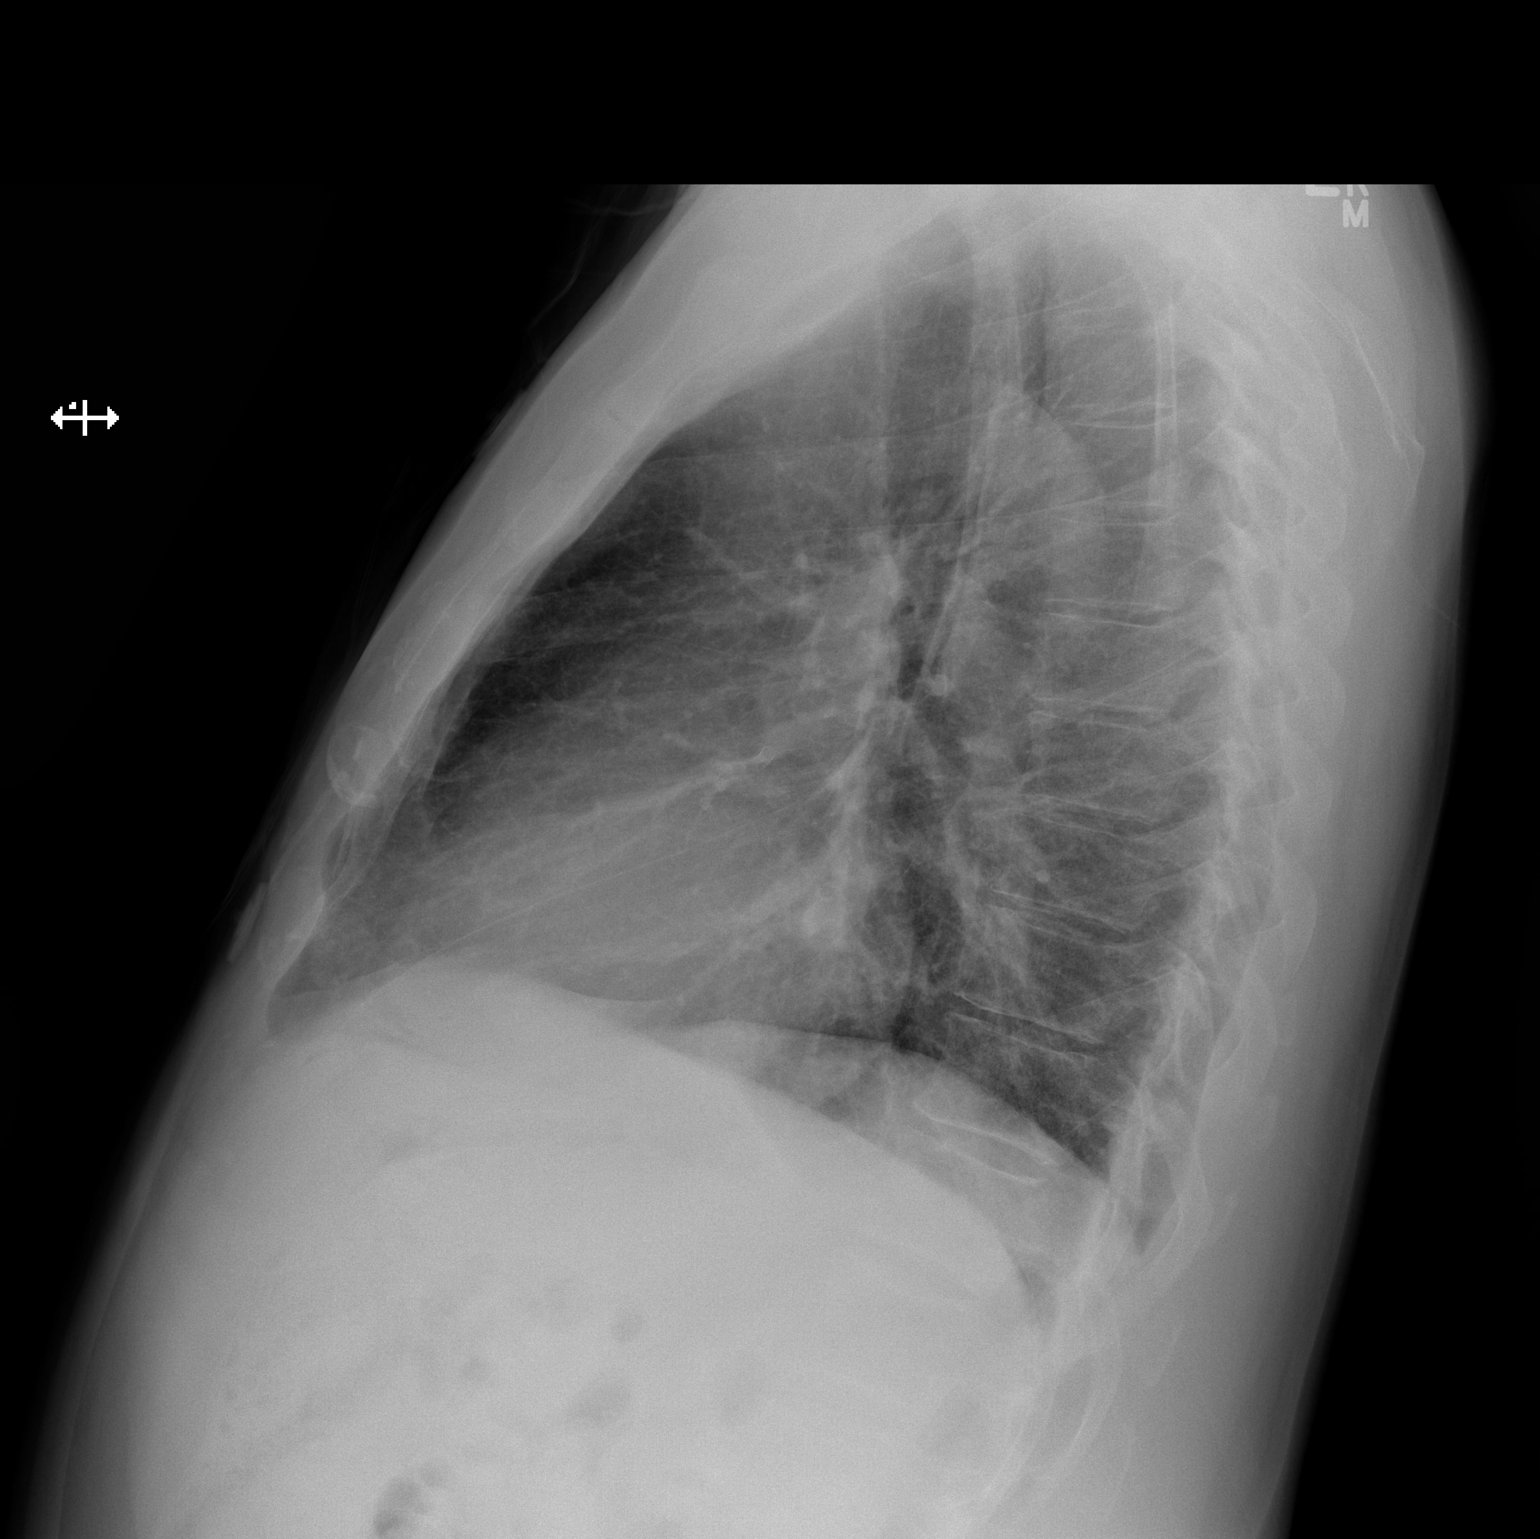

[2 of 2 positions shown; findings below may reference images not displayed]

FINDINGS: The heart size and mediastinal contours are within normal limits.
Both lungs are clear. The visualized skeletal structures are
unremarkable.
IMPRESSION: No active cardiopulmonary disease.

## 2019-09-10 ENCOUNTER — Encounter: Payer: Self-pay | Admitting: Thoracic Surgery (Cardiothoracic Vascular Surgery)

## 2019-09-10 ENCOUNTER — Telehealth: Payer: Self-pay

## 2019-09-10 ENCOUNTER — Ambulatory Visit (HOSPITAL_COMMUNITY): Payer: 59 | Attending: Cardiology

## 2019-09-10 ENCOUNTER — Ambulatory Visit: Payer: 59 | Admitting: Thoracic Surgery (Cardiothoracic Vascular Surgery)

## 2019-09-10 ENCOUNTER — Other Ambulatory Visit: Payer: Self-pay

## 2019-09-10 VITALS — BP 116/79 | HR 88 | Temp 97.5°F | Resp 16 | Ht 71.0 in | Wt 205.0 lb

## 2019-09-10 DIAGNOSIS — Z9889 Other specified postprocedural states: Secondary | ICD-10-CM

## 2019-09-10 DIAGNOSIS — I351 Nonrheumatic aortic (valve) insufficiency: Secondary | ICD-10-CM | POA: Insufficient documentation

## 2019-09-10 NOTE — Progress Notes (Signed)
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   His postoperative recovery was uneventful and he was last seen here in our office on December 11, 2018 at which time he was doing well.  Since then he has continued to do well from a cardiac standpoint.  He was seen in follow-up recently by Dr. Radford Pax in follow-up echocardiogram performed earlier today reveals stable intact valve repair with only mild residual aortic insufficiency.  Patient reports that he is doing well.  He has been having some abdominal bloating recently for which she has been referred to a gastroenterologist.  He denies symptoms of exertional shortness of breath.  Overall he states that from a cardiac standpoint he feels "much improved" in comparison with how he felt prior to surgery.  He states that he only gets short of breath with strenuous physical exertion and his exercise tolerance is notably considerably better than it was prior to surgery.  He is not having exertional chest pain or chest tightness.  He is quite active physically and overall getting along quite well.   Current Outpatient Medications  Medication Sig Dispense Refill  . aspirin EC 81 MG tablet Take 81 mg by mouth daily.    Marland Kitchen atorvastatin (LIPITOR) 80 MG tablet Take 1 tablet (80 mg total) by mouth daily at 6 PM. 90 tablet 3  . calcium carbonate (TUMS - DOSED IN MG ELEMENTAL  CALCIUM) 500 MG chewable tablet Chew 2 tablets by mouth daily as needed for indigestion or heartburn.    . fluticasone (FLONASE) 50 MCG/ACT nasal spray Place 1 spray into both nostrils daily as needed for allergies or rhinitis.    Marland Kitchen loratadine (CLARITIN) 10 MG tablet Take 10 mg by mouth daily.     . metoprolol tartrate (LOPRESSOR) 25 MG tablet Take 1 tablet (25 mg total) by mouth 2 (two) times daily. 180 tablet 3  . Multiple Vitamin (MULTI-VITAMIN PO) Take 1 tablet by mouth daily.    . nitroGLYCERIN (NITROSTAT) 0.4 MG SL tablet Place 1 tablet (0.4 mg total) under the tongue every 5 (five) minutes x 3 doses as needed for chest pain. 25 tablet 3  . pantoprazole (PROTONIX) 40 MG tablet Take 1 tablet (40 mg total) by mouth daily. 90 tablet 1   No current facility-administered medications for this visit.      Physical Exam:   BP 116/79 (BP Location: Right Arm, Patient Position: Sitting, Cuff Size: Normal)   Pulse 88   Temp (!) 97.5 F (36.4 C)   Resp 16   Ht 5\' 11"  (1.803 m)   Wt 205 lb (93 kg)   SpO2 93% Comment: RA  BMI 28.59 kg/m   General:  Well-appearing  Chest:   Clear to auscultation  CV:   Regular rate and rhythm without murmur  Incisions:  Completely healed, sternum is stable  Abdomen:  Soft and  nontender  Extremities:  Warm and well-perfused  Diagnostic Tests:  ECHOCARDIOGRAM REPORT       Patient Name:  LEBRON NAUERT Date of Exam: 09/10/2019  Medical Rec #: [id number]    Height:    71.0 in  Accession #:  [id number]    Weight:    205.8 lb  Date of Birth: [birth year]    BSA:     2.134 m  Patient Age:  54 years     BP:      112/66 mmHg  Patient Gender: M        HR:      70 bpm.  Exam Location: [facility]   Procedure: 2D Echo, Cardiac Doppler and Color Doppler   Indications:  I35.1 Nonrheumatic aortic (valve) insufficiency    History:    Patient has prior history of Echocardiogram examinations,  most           recent 11/14/2018. CHF, Previous Myocardial Infarction and  CAD,         Signs/Symptoms:Dyspnea; Risk Factors:Hypertension and         Dyslipidemia. Aortic valve disease. Aortic valve repair.         Obstructive sleep apnea.         Aortic Valve: 21 mm Annulplasty ring valve is present in  the         aortic position.    Sonographer:  Diamond Nickel RCS  Referring Phys: [place]    1. Left ventricular ejection fraction, by estimation, is 50 to 55%. The  left ventricle has low normal function. The left ventricle has no regional  wall motion abnormalities. There is moderate left ventricular hypertrophy  of the basal-septal segment.  Left ventricular diastolic parameters are consistent with Grade I  diastolic dysfunction (impaired relaxation).  2. Right ventricular systolic function is normal. The right ventricular  size is normal. There is normal pulmonary artery systolic pressure. The  estimated right ventricular systolic pressure is 67.2 mmHg.  3. The mitral valve is normal in structure. Trivial mitral valve  regurgitation. No evidence of mitral stenosis.  4. The aortic valve has been repaired/replaced. Aortic valve  regurgitation is mild. No aortic stenosis is present. There is a 21 mm  Annulplasty ring present in the aortic position. Aortic regurgitation PHT  measures 1207 msec. Aortic valve area, by VTI  measures 1.45 cm. Aortic valve mean gradient measures 8.0 mmHg. Aortic  valve Vmax measures 2.11 m/s.  5. Aortic dilatation noted. There is mild dilatation of the aortic root  measuring 38 mm.  6. The inferior vena cava is normal in size with greater than 50%  respiratory variability, suggesting right atrial pressure of 3 mmHg.  7. No change from prior echo 10/2018.   FINDINGS  Left Ventricle: Left ventricular ejection fraction, by estimation, is 50  to 55%. The left ventricle  has low normal function. The left ventricle has  no regional wall motion abnormalities. The left ventricular internal  cavity size was normal in size.  There is moderate left ventricular hypertrophy of the basal-septal  segment. Left ventricular diastolic parameters are consistent with Grade I  diastolic dysfunction (impaired relaxation). Normal left ventricular  filling pressure.   Right Ventricle: The right ventricular size is normal. No increase in  right ventricular wall thickness. Right ventricular systolic function is  normal. There is normal pulmonary artery systolic pressure. The tricuspid  regurgitant velocity is 2.08 m/s, and  with an assumed right atrial pressure of  3 mmHg, the estimated right  ventricular systolic pressure is 35.3 mmHg.   Left Atrium: Left atrial size was normal in size.   Right Atrium: Right atrial size was normal in size.   Pericardium: There is no evidence of pericardial effusion.   Mitral Valve: The mitral valve is normal in structure. Normal mobility of  the mitral valve leaflets. Trivial mitral valve regurgitation. No evidence  of mitral valve stenosis.   Tricuspid Valve: The tricuspid valve is normal in structure. Tricuspid  valve regurgitation is trivial. No evidence of tricuspid stenosis.   Aortic Valve: The aortic valve has been repaired/replaced. Aortic valve  regurgitation is mild. Aortic regurgitation PHT measures 1207 msec. No  aortic stenosis is present. Aortic valve mean gradient measures 8.0 mmHg.  Aortic valve peak gradient measures  17.8 mmHg. Aortic valve area, by VTI measures 1.45 cm. There is a 21 mm  Annulplasty ring present in the aortic position.   Pulmonic Valve: The pulmonic valve was normal in structure. Pulmonic valve  regurgitation is trivial. No evidence of pulmonic stenosis.   Aorta: Aortic dilatation noted. There is mild dilatation of the aortic  root measuring 38 mm.   Venous: The inferior vena cava is normal in  size with greater than 50%  respiratory variability, suggesting right atrial pressure of 3 mmHg.   IAS/Shunts: No atrial level shunt detected by color flow Doppler.     LEFT VENTRICLE  PLAX 2D  LVIDd:     4.72 cm Diastology  LVIDs:     3.60 cm LV e' lateral:  7.72 cm/s  LV PW:     1.07 cm LV E/e' lateral: 7.7  LV IVS:    1.54 cm LV e' medial:  6.53 cm/s  LVOT diam:   2.00 cm LV E/e' medial: 9.1  LV SV:     58  LV SV Index:  27  LVOT Area:   3.14 cm     RIGHT VENTRICLE  RV Basal diam: 2.35 cm  RV S prime:   12.30 cm/s  TAPSE (M-mode): 2.3 cm  RVSP:      20.3 mmHg   LEFT ATRIUM       Index    RIGHT ATRIUM      Index  LA diam:    3.40 cm 1.59 cm/m RA Pressure: 3.00 mmHg  LA Vol (A2C):  48.3 ml 22.63 ml/m RA Area:   15.10 cm  LA Vol (A4C):  36.8 ml 17.24 ml/m RA Volume:  35.40 ml 16.59 ml/m  LA Biplane Vol: 43.6 ml 20.43 ml/m  AORTIC VALVE  AV Area (Vmax):  1.21 cm  AV Area (Vmean):  1.23 cm  AV Area (VTI):   1.45 cm  AV Vmax:      211.00 cm/s  AV Vmean:     132.000 cm/s  AV VTI:      0.402 m  AV Peak Grad:   17.8 mmHg  AV Mean Grad:   8.0 mmHg  LVOT Vmax:     81.30 cm/s  LVOT Vmean:    51.500 cm/s  LVOT VTI:     0.186 m  LVOT/AV VTI ratio: 0.46  AI PHT:      1207 msec    AORTA  Ao Root diam: 3.80 cm   MITRAL VALVE        TRICUSPID VALVE  MV Area (PHT): 2.24 cm  TR Peak grad:  17.3 mmHg  MV Decel Time: 338 msec  TR Vmax:    208.00 cm/s  MV E velocity: 59.20 cm/s Estimated RAP: 3.00 mmHg  MV A velocity: 76.60 cm/s RVSP:      20.3 mmHg  MV E/A ratio: 0.77               SHUNTS               Systemic VTI: 0.19 m               Systemic Diam: 2.00 cm   [name] MD  Electronically signed by [name] MD  Signature Date/Time: 09/10/2019/10:04:16 AM       Impression:  Patient is doing very well approximately 1 year status post aortic valve repair  Plan:  We have not recommended any change the patient's current medications.  He will continue to follow-up periodically with Dr. Radford Pax.  He will return to our office in approximately 1 year to review the results of his most recent follow-up echocardiogram.  I spent in excess of 15 minutes during the conduct of this office consultation and >50% of this time involved direct face-to-face encounter with the patient for counseling and/or coordination of their care.    Valentina Gu. Roxy Manns, MD 09/10/2019 4:50 PM

## 2019-09-10 NOTE — Patient Instructions (Signed)

## 2019-09-10 NOTE — Telephone Encounter (Signed)
-----   Message from Sueanne Margarita, MD sent at 09/10/2019 11:28 AM EDT ----- Echo showed low normal heart function with moderately thickened heart muscle and stable AV repair with mildly leaky AV - repeat echo in 1 year for AI

## 2019-09-20 DIAGNOSIS — H43393 Other vitreous opacities, bilateral: Secondary | ICD-10-CM | POA: Diagnosis not present

## 2019-09-25 ENCOUNTER — Other Ambulatory Visit: Payer: Self-pay | Admitting: Internal Medicine

## 2019-09-25 NOTE — Telephone Encounter (Signed)
Please refill as per office routine med refill policy (all routine meds refilled for 3 mo or monthly per pt preference up to one year from last visit, then month to month grace period for 3 mo, then further med refills will have to be denied)  

## 2019-09-26 ENCOUNTER — Other Ambulatory Visit: Payer: Self-pay | Admitting: Internal Medicine

## 2019-10-15 DIAGNOSIS — R3912 Poor urinary stream: Secondary | ICD-10-CM | POA: Diagnosis not present

## 2019-10-15 DIAGNOSIS — R311 Benign essential microscopic hematuria: Secondary | ICD-10-CM | POA: Diagnosis not present

## 2019-10-15 DIAGNOSIS — N401 Enlarged prostate with lower urinary tract symptoms: Secondary | ICD-10-CM | POA: Diagnosis not present

## 2019-10-15 DIAGNOSIS — N200 Calculus of kidney: Secondary | ICD-10-CM | POA: Diagnosis not present

## 2019-10-29 ENCOUNTER — Other Ambulatory Visit: Payer: Self-pay

## 2019-10-29 ENCOUNTER — Other Ambulatory Visit: Payer: 59

## 2019-10-29 DIAGNOSIS — E7800 Pure hypercholesterolemia, unspecified: Secondary | ICD-10-CM

## 2019-10-29 LAB — LIPID PANEL
Chol/HDL Ratio: 2.6 ratio (ref 0.0–5.0)
Cholesterol, Total: 105 mg/dL (ref 100–199)
HDL: 40 mg/dL (ref >39)
LDL Chol Calc (NIH): 52 mg/dL (ref 0–99)
Triglycerides: 59 mg/dL (ref 0–149)
VLDL Cholesterol Cal: 13 mg/dL (ref 5–40)

## 2019-10-29 LAB — ALT: ALT: 24 IU/L (ref 0–44)

## 2019-11-08 ENCOUNTER — Encounter: Payer: 59 | Admitting: Internal Medicine

## 2019-12-06 ENCOUNTER — Encounter: Payer: Self-pay | Admitting: Internal Medicine

## 2019-12-06 ENCOUNTER — Other Ambulatory Visit: Payer: Self-pay

## 2019-12-06 ENCOUNTER — Ambulatory Visit (INDEPENDENT_AMBULATORY_CARE_PROVIDER_SITE_OTHER): Payer: 59 | Admitting: Internal Medicine

## 2019-12-06 VITALS — BP 130/80 | HR 96 | Temp 98.8°F | Ht 71.0 in | Wt 206.0 lb

## 2019-12-06 DIAGNOSIS — Z0000 Encounter for general adult medical examination without abnormal findings: Secondary | ICD-10-CM | POA: Diagnosis not present

## 2019-12-06 DIAGNOSIS — Z23 Encounter for immunization: Secondary | ICD-10-CM | POA: Diagnosis not present

## 2019-12-06 DIAGNOSIS — Z1159 Encounter for screening for other viral diseases: Secondary | ICD-10-CM | POA: Diagnosis not present

## 2019-12-06 DIAGNOSIS — R7303 Prediabetes: Secondary | ICD-10-CM

## 2019-12-06 NOTE — Progress Notes (Signed)
Subjective:    [name], male    DOB: 10-14-1965, 54 y.o.   MRN: [id number]  HPI  Here for wellness and f/u;  Overall doing ok;  Pt denies Chest pain, worsening SOB, DOE, wheezing, orthopnea, PND, worsening LE edema, palpitations, dizziness or syncope.  Pt denies neurological change such as new headache, facial or extremity weakness.  Pt denies polydipsia, polyuria, or low sugar symptoms. Pt states overall good compliance with treatment and medications, good tolerability, and has been trying to follow appropriate diet.  Pt denies worsening depressive symptoms, suicidal ideation or panic. No fever, night sweats, wt loss, loss of appetite, or other constitutional symptoms.  Pt states good ability with ADL's, has low fall risk, home safety reviewed and adequate, no other significant changes in hearing or vision, and only occasionally active with exercise. No new complaints Past Medical History:  Diagnosis Date  . Aortic insufficiency    Status post aortic valve repair // Echo 8/20: EF 50-55, moderate LVH, grade 1 diastolic dysfunction, normal RV SF, status post AV repair, mild AI, mild dilation of aortic root (38 mm)  . CAD (coronary artery disease)    a.  Non-STEMI 8/16: LHC-proximal LAD 80% treated with a resolute DES, EF 50-55%;  b.  Echo 8/16:  Moderate LVH, EF 55-60%, normal wall motion, normal diastolic function, normal RV function  . CHF (congestive heart failure) ([facility])   . Dyspnea    mild  . GERD (gastroesophageal reflux disease)   . Headache    "maybe monthly" (10/28/2014)  . HLD (hyperlipidemia)   . Hypertension   . NSTEMI (non-ST elevated myocardial infarction) ([facility]) 10/28/2014  . OSA (obstructive sleep apnea)    "tried mask; wear it off and on" (10/28/2014)  . S/P aortic valve repair 09/15/2018   Complex valvuloplasty including plication of prolapsing right coronary cusp with Biostable HAART 300 aortic annuloplasty, size 21 mm   Past Surgical History:  Procedure  Laterality Date  . AORTIC VALVE REPAIR N/A 09/15/2018   Procedure: AORTIC VALVE REPAIR USING HAART 300 AORTIC ANNULOPLASTY DEVICE SIZE 21MM;  Surgeon: Rexene Alberts, MD;  Location: [facility];  Service: Open Heart Surgery;  Laterality: N/A;  . 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 CARDIAC CATHETERIZATION N/A 10/29/2014   Procedure: Left Heart Cath and Coronary Angiography;  Surgeon: Troy Sine, MD;  Location: [facility];  Service: Cardiovascular;  Laterality: N/A;  . CHOLESTEATOMA EXCISION Right 1990's  . LEFT HEART CATH AND CORONARY ANGIOGRAPHY N/A 05/25/2016   Procedure: Left Heart Cath and Coronary Angiography;  Surgeon: Sherren Mocha, MD;  Location: [facility];  Service: Cardiovascular;  Laterality: N/A;  . MULTIPLE TOOTH EXTRACTIONS  08/29/2018  . NASAL SEPTOPLASTY W/ TURBINOPLASTY Bilateral 11/30/2017   Procedure: NASAL SEPTOPLASTY WITH TURBINATE REDUCTION;  Surgeon: Jerrell Belfast, MD;  Location: [facility];  Service: ENT;  Laterality: Bilateral;  . RIGHT/LEFT HEART CATH AND CORONARY ANGIOGRAPHY N/A 08/11/2018   Procedure: RIGHT/LEFT HEART CATH AND CORONARY ANGIOGRAPHY;  Surgeon: Burnell Blanks, MD;  Location: [facility];  Service: Cardiovascular;  Laterality: N/A;  . TEE WITHOUT CARDIOVERSION N/A 06/08/2018   Procedure: TRANSESOPHAGEAL ECHOCARDIOGRAM (TEE);  Surgeon: Buford Dresser, MD;  [facility];  Service: Cardiovascular;  Laterality: N/A;  . TEE WITHOUT CARDIOVERSION N/A 09/15/2018   Procedure: TRANSESOPHAGEAL ECHOCARDIOGRAM (TEE);  Surgeon: Rexene Alberts, MD;  Location: [facility];  Service: Open Heart Surgery;  Laterality: N/A;  .  TYMPANOSTOMY TUBE PLACEMENT Bilateral "as a kid"   "had one done as an adult too"  . WISDOM TOOTH EXTRACTION      reports that he has been smoking cigarettes. He has a 10.56 pack-year smoking history. He has never used smokeless tobacco. He reports previous drug use. Drug: Marijuana. He  reports that he does not drink alcohol. family history includes Bradycardia in his father; Diverticulitis in his mother; Healthy in his father; Multiple sclerosis in his maternal grandfather; Skin cancer in his mother. Allergies  Allergen Reactions  . Morphine And Related Itching   Current Outpatient Medications on File Prior to Visit  Medication Sig Dispense Refill  . aspirin EC 81 MG tablet Take 81 mg by mouth daily.    Marland Kitchen atorvastatin (LIPITOR) 80 MG tablet Take 1 tablet (80 mg total) by mouth daily at 6 PM. 90 tablet 3  . calcium carbonate (TUMS - DOSED IN MG ELEMENTAL CALCIUM) 500 MG chewable tablet Chew 2 tablets by mouth daily as needed for indigestion or heartburn.    . fluticasone (FLONASE) 50 MCG/ACT nasal spray Place 1 spray into both nostrils daily as needed for allergies or rhinitis.    Marland Kitchen loratadine (CLARITIN) 10 MG tablet Take 10 mg by mouth daily.     . metoprolol tartrate (LOPRESSOR) 25 MG tablet Take 1 tablet (25 mg total) by mouth 2 (two) times daily. 180 tablet 3  . Multiple Vitamin (MULTI-VITAMIN PO) Take 1 tablet by mouth daily.    . nitroGLYCERIN (NITROSTAT) 0.4 MG SL tablet Place 1 tablet (0.4 mg total) under the tongue every 5 (five) minutes x 3 doses as needed for chest pain. 25 tablet 3  . pantoprazole (PROTONIX) 40 MG tablet TAKE 1 TABLET (40 MG TOTAL) BY MOUTH DAILY. 90 tablet 1   No current facility-administered medications on file prior to visit.   Review of Systems All otherwise neg per pt    Objective:   Physical Exam BP 130/80 (BP Location: Left Arm, Patient Position: Sitting, Cuff Size: Large)   Pulse 96   Temp 98.8 F (37.1 C) (Oral)   Ht 5\' 11"  (1.803 m)   Wt 206 lb (93.4 kg)   SpO2 99%   BMI 28.73 kg/m  VS noted,  Constitutional: Pt appears in NAD HENT: Head: NCAT.  Right Ear: External ear normal.  Left Ear: External ear normal.  Eyes: . Pupils are equal, round, and reactive to light. Conjunctivae and EOM are normal Nose: without d/c or  deformity Neck: Neck supple. Gross normal ROM Cardiovascular: Normal rate and regular rhythm.   Pulmonary/Chest: Effort normal and breath sounds without rales or wheezing.  Abd:  Soft, NT, ND, + BS, no organomegaly Neurological: Pt is alert. At baseline orientation, motor grossly intact Skin: Skin is warm. No rashes, other new lesions, no LE edema Psychiatric: Pt behavior is normal without agitation  All otherwise neg per pt Lab Results  Component Value Date   WBC 8.1 08/09/2019   HGB 15.2 08/09/2019   HCT 44.4 08/09/2019   PLT 236.0 08/09/2019   GLUCOSE 111 (H) 08/09/2019   CHOL 105 10/29/2019   TRIG 59 10/29/2019   HDL 40 10/29/2019   LDLCALC 52 10/29/2019   ALT 24 10/29/2019   AST 22 08/09/2019   NA 141 08/09/2019   K 4.3 08/09/2019   CL 108 08/09/2019   CREATININE 1.11 08/09/2019   BUN 28 (H) 08/09/2019   CO2 23 08/09/2019   TSH 0.71 11/07/2018   PSA 1.27 11/07/2018  INR 1.4 (H) 09/15/2018   HGBA1C 5.8 11/07/2018      Assessment & Plan:

## 2019-12-06 NOTE — Patient Instructions (Addendum)
You had the Shingles shot #1 today  Please return for a nurse visit appt in 2 months (or soon after) for shingle shot #2  Please continue all other medications as before, and refills have been done if requested.  Please have the pharmacy call with any other refills you may need.  Please continue your efforts at being more active, low cholesterol diet, and weight control.  You are otherwise up to date with prevention measures today.  Please keep your appointments with your specialists as you may have planned  Please go to the LAB at the blood drawing area for the tests to be done - at the [facility] lab at your convenience  You will be contacted by phone if any changes need to be made immediately.  Otherwise, you will receive a letter about your results with an explanation, but please check with MyChart first.  Please remember to sign up for MyChart if you have not done so, as this will be important to you in the future with finding out test results, communicating by private email, and scheduling acute appointments online when needed.  Please make an Appointment to return for your 1 year visit, or sooner if needed

## 2019-12-08 ENCOUNTER — Encounter: Payer: Self-pay | Admitting: Internal Medicine

## 2019-12-08 NOTE — Assessment & Plan Note (Signed)
stable overall by history and exam, recent data reviewed with pt, and pt to continue medical treatment as before,  to f/u any worsening symptoms or concerns  

## 2019-12-08 NOTE — Assessment & Plan Note (Signed)

## 2019-12-10 ENCOUNTER — Encounter: Payer: Self-pay | Admitting: Internal Medicine

## 2019-12-10 ENCOUNTER — Other Ambulatory Visit (INDEPENDENT_AMBULATORY_CARE_PROVIDER_SITE_OTHER): Payer: 59

## 2019-12-10 DIAGNOSIS — Z1159 Encounter for screening for other viral diseases: Secondary | ICD-10-CM

## 2019-12-10 DIAGNOSIS — Z0000 Encounter for general adult medical examination without abnormal findings: Secondary | ICD-10-CM

## 2019-12-10 DIAGNOSIS — R7303 Prediabetes: Secondary | ICD-10-CM

## 2019-12-10 LAB — URINALYSIS, ROUTINE W REFLEX MICROSCOPIC
Bilirubin Urine: NEGATIVE
Ketones, ur: NEGATIVE
Leukocytes,Ua: NEGATIVE
Nitrite: NEGATIVE
Specific Gravity, Urine: 1.015 (ref 1.000–1.030)
Total Protein, Urine: NEGATIVE
Urine Glucose: NEGATIVE
Urobilinogen, UA: 0.2 (ref 0.0–1.0)
pH: 6.5 (ref 5.0–8.0)

## 2019-12-10 LAB — CBC WITH DIFFERENTIAL/PLATELET
Basophils Absolute: 0.1 K/uL (ref 0.0–0.1)
Basophils Relative: 1 % (ref 0.0–3.0)
Eosinophils Absolute: 0.3 K/uL (ref 0.0–0.7)
Eosinophils Relative: 3.8 % (ref 0.0–5.0)
HCT: 44.2 % (ref 39.0–52.0)
Hemoglobin: 15.4 g/dL (ref 13.0–17.0)
Lymphocytes Relative: 15.5 % (ref 12.0–46.0)
Lymphs Abs: 1.3 K/uL (ref 0.7–4.0)
MCHC: 34.8 g/dL (ref 30.0–36.0)
MCV: 93.6 fl (ref 78.0–100.0)
Monocytes Absolute: 0.8 K/uL (ref 0.1–1.0)
Monocytes Relative: 9.8 % (ref 3.0–12.0)
Neutro Abs: 6 K/uL (ref 1.4–7.7)
Neutrophils Relative %: 69.9 % (ref 43.0–77.0)
Platelets: 202 K/uL (ref 150.0–400.0)
RBC: 4.73 Mil/uL (ref 4.22–5.81)
RDW: 14.4 % (ref 11.5–15.5)
WBC: 8.6 K/uL (ref 4.0–10.5)

## 2019-12-10 LAB — COMPREHENSIVE METABOLIC PANEL
ALT: 17 U/L (ref 0–53)
AST: 15 U/L (ref 0–37)
Albumin: 4.2 g/dL (ref 3.5–5.2)
Alkaline Phosphatase: 70 U/L (ref 39–117)
BUN: 20 mg/dL (ref 6–23)
CO2: 24 mEq/L (ref 19–32)
Calcium: 8.8 mg/dL (ref 8.4–10.5)
Chloride: 105 mEq/L (ref 96–112)
Creatinine, Ser: 0.94 mg/dL (ref 0.40–1.50)
GFR: 83.32 mL/min (ref >60.00)
Glucose, Bld: 94 mg/dL (ref 70–99)
Potassium: 3.8 mEq/L (ref 3.5–5.1)
Sodium: 140 mEq/L (ref 135–145)
Total Bilirubin: 0.6 mg/dL (ref 0.2–1.2)
Total Protein: 6.7 g/dL (ref 6.0–8.3)

## 2019-12-10 LAB — LIPID PANEL
Cholesterol: 113 mg/dL (ref 0–200)
HDL: 38.7 mg/dL — ABNORMAL LOW (ref >39.00)
LDL Cholesterol: 57 mg/dL (ref 0–99)
NonHDL: 74.64
Total CHOL/HDL Ratio: 3
Triglycerides: 86 mg/dL (ref 0.0–149.0)
VLDL: 17.2 mg/dL (ref 0.0–40.0)

## 2019-12-10 LAB — PSA: PSA: 0.89 ng/mL (ref 0.10–4.00)

## 2019-12-10 LAB — HEMOGLOBIN A1C: Hgb A1c MFr Bld: 6.1 % (ref 4.6–6.5)

## 2019-12-10 LAB — TSH: TSH: 1.64 u[IU]/mL (ref 0.35–4.50)

## 2019-12-10 NOTE — Addendum Note (Signed)
Addended by: Trenda Moots on: 08/19/7996 08:40 AM   Modules accepted: Orders

## 2019-12-11 LAB — HEPATITIS C ANTIBODY
Hepatitis C Ab: NONREACTIVE
SIGNAL TO CUT-OFF: 0.01 (ref <1.00)

## 2019-12-21 IMAGING — DX PORTABLE CHEST - 1 VIEW
1 series · 1 of 1 positions shown · non-contrast
Comparison: 09/16/2018

CLINICAL DATA: Atelectasis.

EXAM:
PORTABLE CHEST 1 VIEW

[chest ap]
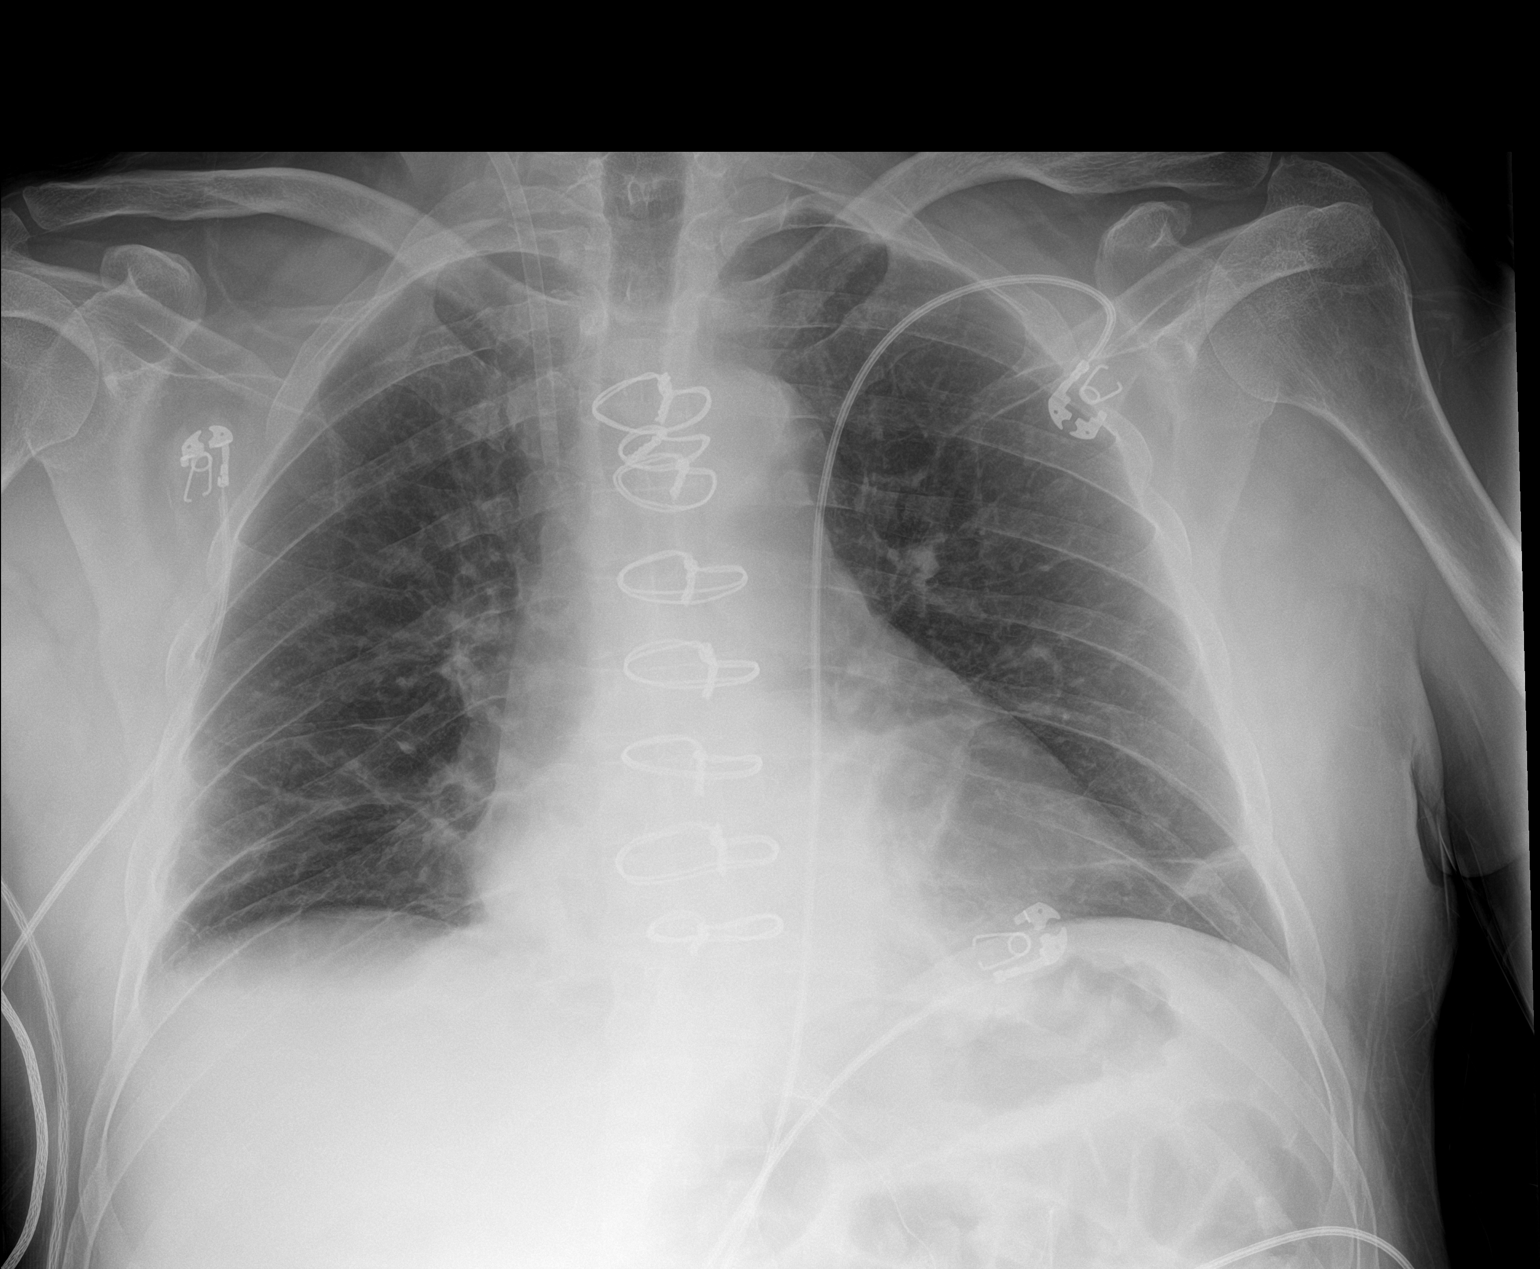

[1 of 1 positions shown; findings below may reference images not displayed]

FINDINGS: Right IJ central venous sheath has tip over the SVC. Sternotomy
wires unchanged. Lungs are somewhat hypoinflated with improving
bibasilar atelectasis. No significant effusion. No pneumothorax.
Cardiomediastinal silhouette and remainder of the exam is unchanged.
IMPRESSION: Hypoinflation with improving bibasilar atelectasis.

## 2020-03-26 ENCOUNTER — Other Ambulatory Visit: Payer: Self-pay | Admitting: Family Medicine

## 2020-03-26 ENCOUNTER — Ambulatory Visit: Payer: Self-pay

## 2020-03-26 ENCOUNTER — Encounter: Payer: Self-pay | Admitting: Family Medicine

## 2020-03-26 ENCOUNTER — Other Ambulatory Visit: Payer: Self-pay

## 2020-03-26 ENCOUNTER — Ambulatory Visit: Payer: 59 | Admitting: Family Medicine

## 2020-03-26 VITALS — BP 132/86 | HR 83 | Ht 71.0 in | Wt 201.0 lb

## 2020-03-26 DIAGNOSIS — M25512 Pain in left shoulder: Secondary | ICD-10-CM

## 2020-03-26 DIAGNOSIS — M542 Cervicalgia: Secondary | ICD-10-CM | POA: Diagnosis not present

## 2020-03-26 DIAGNOSIS — S58112S Complete traumatic amputation at level between elbow and wrist, left arm, sequela: Secondary | ICD-10-CM | POA: Diagnosis not present

## 2020-03-26 DIAGNOSIS — M5030 Other cervical disc degeneration, unspecified cervical region: Secondary | ICD-10-CM

## 2020-03-26 MED ORDER — GABAPENTIN 100 MG PO CAPS: 200.0000 mg | ORAL_CAPSULE | Freq: Every day | ORAL | 0 refills | Status: DC

## 2020-03-26 NOTE — Progress Notes (Signed)
[place] Phone: [phone] Subjective:   [name] No, am serving as a scribe for Dr. Hulan Saas. This visit occurred during the SARS-CoV-2 public health emergency.  Safety protocols were in place, including screening questions prior to the visit, additional usage of staff PPE, and extensive cleaning of exam room while observing appropriate contact time as indicated for disinfecting solutions.   I'm seeing this patient by the request  of:  Biagio Borg, MD  CC: Left arm greater than right arm pain  [id number]  Isaac Patel is a 54 y.o. male coming in with complaint of neck and left shoulder pain. Patient states that he sometimes has pain in right trap as well. Notes stiffness in neck. Does get numbness in hands. For 15 years has been wearing prosthetic on left lower arm.  This was secondary to a injury at work.  Feels imbalanced. Does have hard time sleeping. Uses Tylenol for pain.  Patient avoids anti-inflammatories secondary to heart condition  Cervical xray 2019 IMPRESSION: Moderate degenerative cervical spondylosis for age with multilevel disc disease and facet disease. There is also mild multilevel foraminal narrowing due to uncinate spurring.       Past Medical History:  Diagnosis Date  . Aortic insufficiency    Status post aortic valve repair // Echo 8/20: EF 50-55, moderate LVH, grade 1 diastolic dysfunction, normal RV SF, status post AV repair, mild AI, mild dilation of aortic root (38 mm)  . CAD (coronary artery disease)    a.  Non-STEMI 8/16: LHC-proximal LAD 80% treated with a resolute DES, EF 50-55%;  b.  Echo 8/16:  Moderate LVH, EF 55-60%, normal wall motion, normal diastolic function, normal RV function  . CHF (congestive heart failure) ([place])   . Dyspnea    mild  . GERD (gastroesophageal reflux disease)   . Headache    "maybe monthly" (10/28/2014)  . HLD (hyperlipidemia)   .  Hypertension   . NSTEMI (non-ST elevated myocardial infarction) ([place]) 10/28/2014  . OSA (obstructive sleep apnea)    "tried mask; wear it off and on" (10/28/2014)  . 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 AORTIC VALVE REPAIR N/A 09/15/2018   Procedure: AORTIC VALVE REPAIR USING HAART 300 AORTIC ANNULOPLASTY DEVICE SIZE 21MM;  Surgeon: Rexene Alberts, MD;  Location: [facility];  Service: Open Heart Surgery;  Laterality: N/A;  . 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 CARDIAC CATHETERIZATION N/A 10/29/2014   Procedure: Left Heart Cath and Coronary Angiography;  Surgeon: Troy Sine, MD;  Location: [facility];  Service: Cardiovascular;  Laterality: N/A;  . CHOLESTEATOMA EXCISION Right 1990's  . LEFT HEART CATH AND CORONARY ANGIOGRAPHY N/A 05/25/2016   Procedure: Left Heart Cath and Coronary Angiography;  Surgeon: Sherren Mocha, MD;  Location: [facility];  Service: Cardiovascular;  Laterality: N/A;  . MULTIPLE TOOTH EXTRACTIONS  08/29/2018  . NASAL SEPTOPLASTY W/ TURBINOPLASTY Bilateral 11/30/2017   Procedure: NASAL SEPTOPLASTY WITH TURBINATE REDUCTION;  Surgeon: Jerrell Belfast, MD;  Location: [facility];  Service: ENT;  Laterality: Bilateral;  . RIGHT/LEFT HEART CATH AND CORONARY ANGIOGRAPHY N/A 08/11/2018   Procedure: RIGHT/LEFT HEART CATH AND CORONARY ANGIOGRAPHY;  Surgeon: Burnell Blanks, MD;  Location: [facility];  Service: Cardiovascular;  Laterality: N/A;  . TEE WITHOUT CARDIOVERSION N/A 06/08/2018   Procedure: TRANSESOPHAGEAL ECHOCARDIOGRAM (TEE);  Surgeon: Jodelle Red, MD;  Location[facility];  Service: Cardiovascular;  Laterality: N/A;  . TEE WITHOUT CARDIOVERSION N/A 09/15/2018   Procedure: TRANSESOPHAGEAL ECHOCARDIOGRAM (TEE);  Surgeon: Purcell Nails, MD;  Location: [facility] OR;  Service: Open Heart Surgery;  Laterality: N/A;  . TYMPANOSTOMY TUBE PLACEMENT Bilateral "as a kid"   "had one done as an adult too"  . WISDOM TOOTH EXTRACTION     Social History   Socioeconomic History  . Marital status: Married    Spouse name: Not on file  . Number of children: 2  . Years of education: 17  . Highest education level: Not on file  Occupational History  . Occupation: CMA  Tobacco Use  . Smoking status: Current Every Day Smoker    Packs/day: 0.33    Years: 32.00    Pack years: 10.56    Types: Cigarettes    Last attempt to quit: 07/28/2018    Years since quitting: 1.6  . Smokeless tobacco: Never Used  . Tobacco comment: 6 cigarettes per day 08/28/19 [facility]   Vaping Use  . Vaping Use: Former  Substance and Sexual Activity  . Alcohol use: No    Alcohol/week: 2.0 - 3.0 standard drinks    Types: 2 - 3 Standard drinks or equivalent per week    Comment: stopped 06/19/2015,   . Drug use: Not Currently    Types: Marijuana    Comment: 05/24/2016  "stopped marijuana in the early 2000's"  . Sexual activity: Yes  Other Topics Concern  . Not on file  Social History Narrative   Born and raised in [place], [place].  Currently resides in a house with his wife and children. 1 dog. Fun: play computer games, sports.    Denies any religious beliefs effecting health care.    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 Morphine And Related Itching   Family History  Problem Relation Age of Onset  . Diverticulitis Mother   . Skin cancer Mother   . Healthy Father   . Bradycardia Father   . Multiple sclerosis Maternal Grandfather   . Colon cancer Neg Hx    . Stomach cancer Neg Hx   . Rectal cancer Neg Hx   . Liver cancer Neg Hx   . Esophageal cancer Neg Hx      Current Outpatient Medications (Cardiovascular):  .  atorvastatin (LIPITOR) 80 MG tablet, Take 1 tablet (80 mg total) by mouth daily at 6 PM. .  metoprolol tartrate (LOPRESSOR) 25 MG tablet, Take 1 tablet (25 mg total) by mouth 2 (two) times daily. .  nitroGLYCERIN (NITROSTAT) 0.4 MG SL tablet, Place 1 tablet (0.4 mg total) under the tongue every 5 (five) minutes x 3 doses as needed for chest pain.  Current Outpatient Medications (Respiratory):  .  fluticasone (FLONASE) 50 MCG/ACT nasal spray, Place 1 spray into both nostrils daily as needed for allergies or rhinitis. Marland Kitchen  loratadine (CLARITIN) 10 MG tablet, Take 10 mg by mouth daily.  Current Outpatient Medications (Analgesics):  .  aspirin EC 81 MG tablet, Take 81 mg by mouth daily.   Current Outpatient Medications (Other):  .  calcium carbonate (TUMS - DOSED IN MG ELEMENTAL CALCIUM) 500 MG chewable tablet, Chew 2 tablets by mouth daily as needed for indigestion or heartburn. .  gabapentin (NEURONTIN) 100 MG capsule, Take 2 capsules (200 mg total) by mouth at bedtime. .  Multiple Vitamin (MULTI-VITAMIN PO), Take 1 tablet by mouth daily. .  pantoprazole (PROTONIX) 40 MG tablet, TAKE 1 TABLET (40 MG TOTAL) BY MOUTH DAILY.   Reviewed prior external information including notes and imaging from  primary care provider As well as notes that were available from care everywhere and other healthcare systems.  Past medical history, social, surgical and family history all reviewed in electronic medical record.  No pertanent information unless stated regarding to the chief complaint.   Review of Systems:  No headache, visual changes, nausea, vomiting, diarrhea, constipation, dizziness, abdominal pain, skin rash, fevers, chills, night sweats, weight loss, swollen lymph nodes, body aches, joint swelling, chest pain, shortness of breath,  mood changes. POSITIVE muscle aches  Objective  Blood pressure 132/86, pulse 83, height 5\' 11"  (1.803 m), weight 201 lb (91.2 kg), SpO2 98 %.   General: No apparent distress alert and oriented x3 mood and affect normal, dressed appropriately.  HEENT: Pupils equal, extraocular movements intact  Respiratory: Patient's speak in full sentences and does not appear short of breath  Cardiovascular: No lower extremity edema, non tender, no erythema   Left arm shows the postsurgical changes.  Patient does have stump noted with no erythema noted.  Patient does have some mild atrophy noted of the posterior aspect of the shoulder.  Very mild impingement with Hawkins and Neer's.  Neck exam significant tightness noted with lacking the last 10 degrees of extension.  Multiple trigger points noted in the left parascapular region mostly around T3, T4 and T5 in the trapezius and rhomboids negative Spurling's noted today. Right-sided neck tender to palpation but negative Spurling's on the right.  Patient has good range of motion of the right shoulder.  After verbal consent patient was prepped with alcohol swabs and with a 25-gauge half inch needle injected in 3 distinct trigger points in the trapezius and rhomboid muscles with a total of 1 cc of 0.5% Marcaine and 1 cc of Kenalog 40 mg/mL.  Minimal blood loss.  Postinjection instructions given after Band-Aid placement.  Patient able to take deep breaths with no significant shortness of breath or difficulty talking    Impression and Recommendations:     The above documentation has been reviewed and is accurate and complete Lyndal Pulley, DO

## 2020-03-26 NOTE — Patient Instructions (Addendum)
Gabapentin 200mg  at night Trigger point injections today PT will call you See me again in 4-6 weeks

## 2020-03-26 NOTE — Assessment & Plan Note (Signed)
Patient did have what appeared to be more trigger points into the trapezius and rhomboid on the left side.  Patient does have some atrophy of the posterior aspect of the shoulder that is likely secondary to patient's previous injury as well as patient using the heavy prosthesis on this arm.  Differential does include the cervical radiculopathy and started on gabapentin.  We will start formal physical therapy for strengthening.  Patient will follow up with me again in 4 to 6 weeks

## 2020-03-26 NOTE — Assessment & Plan Note (Signed)
Patient on exam today does have some limited range of motion.  X-rays from 2019 did show moderate degenerative disc disease at multiple levels and facet arthropathy.  Could be giving this more of a cervical radiculopathy that is causing some of the discomfort of the arms bilaterally.  I do feel we should have a low threshold for advanced imaging or patient could respond well to epidural if necessary.  Some of patient's other comorbidities such as the coronary artery disease and non-STEMI limit the possibility of anti-inflammatories.  Follow-up with me again 4 to 6 weeks

## 2020-03-26 NOTE — Assessment & Plan Note (Signed)
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  Patient does wear a prosthesis that is fairly heavy.  We discussed that this could be contributing to some of the aches and pains.  Patient has had some muscle imbalances and some atrophy noted of the posterior shoulder girdle that could be contributing to this.  Attempted trigger point injections today.  Differential includes medial cervical radiculopathy.  Does have x-rays showing moderate arthritic changes.  Due to patient's amputation we will have patient start with physical therapy in a more controlled setting.  Given a low dose of gabapentin.  Worsening symptoms may need to consider advanced imaging of the neck patient will follow up with me again in 4 to 6 weeks

## 2020-03-27 ENCOUNTER — Other Ambulatory Visit: Payer: Self-pay

## 2020-03-27 ENCOUNTER — Encounter: Payer: Self-pay | Admitting: Family Medicine

## 2020-03-27 DIAGNOSIS — M5030 Other cervical disc degeneration, unspecified cervical region: Secondary | ICD-10-CM

## 2020-03-27 DIAGNOSIS — M25512 Pain in left shoulder: Secondary | ICD-10-CM

## 2020-03-27 DIAGNOSIS — S58112S Complete traumatic amputation at level between elbow and wrist, left arm, sequela: Secondary | ICD-10-CM

## 2020-04-01 ENCOUNTER — Other Ambulatory Visit: Payer: Self-pay | Admitting: Internal Medicine

## 2020-04-01 ENCOUNTER — Other Ambulatory Visit: Payer: Self-pay | Admitting: Physician Assistant

## 2020-04-01 NOTE — Telephone Encounter (Signed)
Please refill as per office routine med refill policy (all routine meds refilled for 3 mo or monthly per pt preference up to one year from last visit, then month to month grace period for 3 mo, then further med refills will have to be denied)  

## 2020-04-02 ENCOUNTER — Other Ambulatory Visit: Payer: Self-pay | Admitting: Cardiology

## 2020-04-02 MED ORDER — ATORVASTATIN CALCIUM 80 MG PO TABS: 80.0000 mg | ORAL_TABLET | Freq: Every day | ORAL | 1 refills | Status: DC

## 2020-04-02 MED ORDER — METOPROLOL TARTRATE 25 MG PO TABS: 25.0000 mg | ORAL_TABLET | Freq: Two times a day (BID) | ORAL | 1 refills | Status: DC

## 2020-04-03 ENCOUNTER — Other Ambulatory Visit: Payer: Self-pay | Admitting: Internal Medicine

## 2020-04-17 ENCOUNTER — Other Ambulatory Visit: Payer: Self-pay

## 2020-04-17 ENCOUNTER — Ambulatory Visit: Payer: 59 | Attending: Family Medicine | Admitting: Physical Therapy

## 2020-04-17 DIAGNOSIS — R252 Cramp and spasm: Secondary | ICD-10-CM | POA: Insufficient documentation

## 2020-04-17 DIAGNOSIS — R293 Abnormal posture: Secondary | ICD-10-CM | POA: Diagnosis not present

## 2020-04-17 DIAGNOSIS — M542 Cervicalgia: Secondary | ICD-10-CM | POA: Diagnosis not present

## 2020-04-17 NOTE — Patient Instructions (Signed)
Access Code: [email or web address]: [email or web address]: 01/20/2022Prepared by: Anderson Malta PaaExercises  Supine Chin Tuck - 2 x daily - 7 x weekly - 2 sets - 10 reps - 5 hold  Seated Cervical Sidebending Stretch - 2 x daily - 7 x weekly - 1 sets - 5 reps - 30 hold  Gentle Levator Scapulae Stretch - 2 x daily - 7 x weekly - 1 sets - 5 reps - 30 hold  Reverse Fly with Anchored Resistance - 1 x daily - 7 x weekly - 2 sets - 10 reps - 5 hold  Corner Pec Major Stretch - 2 x daily - 7 x weekly - 1 sets - 5 reps - 30 hold

## 2020-04-17 NOTE — Therapy (Signed)
[place], [place], [address] Phone: [phone]   Fax:  [phone]  Physical Therapy Evaluation  Patient Details  Name: Isaac Patel MRN: [id number] Date of Birth: 1965-10-04 Referring Provider (PT): Dr. Hulan Saas   Encounter Date: 04/17/2020   PT End of Session - 04/17/20 1234    Visit Number 1    Number of Visits 8    Date for PT Re-Evaluation 06/13/20    Authorization Type UMR    PT Start Time 0915    PT Stop Time 1000    PT Time Calculation (min) 45 min    Activity Tolerance Patient tolerated treatment well    Behavior During Therapy [facility] for tasks assessed/performed           Past Medical History:  Diagnosis Date  . Aortic insufficiency    Status post aortic valve repair // Echo 8/20: EF 50-55, moderate LVH, grade 1 diastolic dysfunction, normal RV SF, status post AV repair, mild AI, mild dilation of aortic root (38 mm)  . CAD (coronary artery disease)    a.  Non-STEMI 8/16: LHC-proximal LAD 80% treated with a resolute DES, EF 50-55%;  b.  Echo 8/16:  Moderate LVH, EF 55-60%, normal wall motion, normal diastolic function, normal RV function  . CHF (congestive heart failure) ([place])   . Dyspnea    mild  . GERD (gastroesophageal reflux disease)   . Headache    "maybe monthly" (10/28/2014)  . HLD (hyperlipidemia)   . Hypertension   . NSTEMI (non-ST elevated myocardial infarction) ([place]) 10/28/2014  . OSA (obstructive sleep apnea)    "tried mask; wear it off and on" (10/28/2014)  . S/P aortic valve repair 09/15/2018   Complex valvuloplasty including plication of prolapsing right coronary cusp with Biostable HAART 300 aortic annuloplasty, size 21 mm    Past Surgical History:  Procedure Laterality Date  . AORTIC VALVE REPAIR N/A 09/15/2018   Procedure: AORTIC VALVE REPAIR USING HAART 300 AORTIC ANNULOPLASTY DEVICE SIZE 21MM;  Surgeon: Rexene Alberts, MD;  Location: [place];  Service: Open Heart  Surgery;  Laterality: N/A;  . 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 CARDIAC CATHETERIZATION N/A 10/29/2014   Procedure: Left Heart Cath and Coronary Angiography;  Surgeon: Troy Sine, MD;  Location: [place] [facility];  Service: Cardiovascular;  Laterality: N/A;  . CHOLESTEATOMA EXCISION Right 1990's  . LEFT HEART CATH AND CORONARY ANGIOGRAPHY N/A 05/25/2016   Procedure: Left Heart Cath and Coronary Angiography;  Surgeon: Sherren Mocha, MD;  Location: [place] [facility];  Service: Cardiovascular;  Laterality: N/A;  . MULTIPLE TOOTH EXTRACTIONS  08/29/2018  . NASAL SEPTOPLASTY W/ TURBINOPLASTY Bilateral 11/30/2017   Procedure: NASAL SEPTOPLASTY WITH TURBINATE REDUCTION;  Surgeon: Jerrell Belfast, MD;  Location: [place];  Service: ENT;  Laterality: Bilateral;  . RIGHT/LEFT HEART CATH AND CORONARY ANGIOGRAPHY N/A 08/11/2018   Procedure: RIGHT/LEFT HEART CATH AND CORONARY ANGIOGRAPHY;  Surgeon: Burnell Blanks, MD;  Location: [place] [facility];  Service: Cardiovascular;  Laterality: N/A;  . TEE WITHOUT CARDIOVERSION N/A 06/08/2018   Procedure: TRANSESOPHAGEAL ECHOCARDIOGRAM (TEE);  Surgeon: Buford Dresser, MD;  Location[facility] ENDOSCOPY;  Service: Cardiovascular;  Laterality: N/A;  . TEE WITHOUT CARDIOVERSION N/A 09/15/2018   Procedure: TRANSESOPHAGEAL ECHOCARDIOGRAM (TEE);  Surgeon: Rexene Alberts, MD;  Location: [place];  Service: Open Heart Surgery;  Laterality: N/A;  . TYMPANOSTOMY TUBE PLACEMENT Bilateral "as a kid"   "had one  done as an adult too"  . WISDOM TOOTH EXTRACTION      There were no vitals filed for this visit.    Subjective Assessment - 04/17/20 0919    Subjective Patient reports ongoing chronic intermittent neck and shoulder pain (L>R) .  Consisnte for about 6 mos .  L sided upper trap pain has nearly resoleved flare ups occ.  Neck pain is the main complaint and he endroes a radiating pain Rt side (18 mos ago).  He denies weakness.  He has  trouble tunring his head especially with driving. Reminds himself of his posture during the day.    Pertinent History AVR in 2020, L UE trauma with loss of limb    Limitations Sitting;Lifting;House hold activities;Other (comment)   sleeping   Diagnostic tests 2019 XR showed diffiuse degenerative disease and spondylosis in cervical spine with foraminal narrowing    Patient Stated Goals Patient would like to work on HEP and prevent progression of issues, avoid surgery    Currently in Pain? Yes    Pain Score 5    this AM   Pain Location Neck    Pain Orientation Right;Left;Posterior    Pain Descriptors / Indicators Aching;Sore    Pain Type Chronic pain    Pain Radiating Towards across both shoulders    Pain Onset More than a month ago    Pain Frequency Intermittent    Aggravating Factors  AM hours,    Pain Relieving Factors throughout the time    Multiple Pain Sites No              [facility] PT Assessment - 04/17/20 0001      Assessment   Medical Diagnosis Neck pain , L upper trap trigger point    Referring Provider (PT) Dr. Hulan Saas    Onset Date/Surgical Date --   6 mos , chronic   Hand Dominance Right    Prior Therapy long ago      Precautions   Precautions None      Restrictions   Weight Bearing Restrictions No      Balance Screen   Has the patient fallen in the past 6 months No      [place] residence    Living Arrangements Spouse/significant other;Children    Type of [place]      Prior Function   Level of Independence Independent    Vocation Full time employment    Education officer, museum    Leisure [place], [employer] " junkie"      Cognition   Overall Cognitive Status Within Functional Limits for tasks assessed      Observation/Other Assessments   Focus on Therapeutic Outcomes (FOTO)  51%      Sensation   Light Touch Impaired by gross assessment;Not tested    Additional Comments Rt hand goes numb  when looking down for long      Coordination   Gross Motor Movements are Fluid and Coordinated Not tested      Posture/Postural Control   Posture/Postural Control Postural limitations    Postural Limitations Rounded Shoulders;Forward head    Posture Comments L upper trap bulkier than R , atrophy along L upper arm      AROM   Cervical Flexion 41    Cervical Extension 60    Cervical - Right Side Bend 38   combined with rotation   Cervical - Left Side Bend 35   rotates  Cervical - Right Rotation 65    Cervical - Left Rotation 65      PROM   Overall PROM Comments limited in sidebending      Strength   Right Shoulder Flexion 4/5    Right Shoulder ABduction 4/5    Left Shoulder Flexion 3+/5    Left Shoulder ABduction 3+/5    Right Elbow Flexion 4+/5    Right Elbow Extension 4/5      Palpation   Palpation comment sore, painful along suboccipitals and L upper traps, levator scap                      Objective measurements completed on examination: See above findings.       [facility] Adult PT Treatment/Exercise - 04/17/20 0001      Self-Care   Self-Care Posture;Other Self-Care Comments    Other Self-Care Comments  trigger points, DN, posture, HEP      Neck Exercises: Stabilization   Stabilization supine chin tuck      Shoulder Exercises: Standing   Horizontal ABduction Weight (lbs) reverse fly x 10 on wall    External Rotation Weight (lbs) combined with abduction x 10 on wall      Shoulder Exercises: Stretch   Corner Stretch 2 reps;30 seconds                  PT Education - 04/17/20 1234    Education Details PT/POC, self care    Person(s) Educated Patient    Methods Explanation;Demonstration;Handout    Comprehension Verbalized understanding;Returned demonstration            PT Short Term Goals - 04/17/20 1237      PT SHORT TERM GOAL #1   Title Pt will be I with HEP for cervical spine, posture    Time 4    Period Weeks    Status New     Target Date 05/15/20      PT SHORT TERM GOAL #2   Title Pt will notice increased cervical rotation for driving, scanning the environment, less restriction.    Time 4    Period Weeks    Status New    Target Date 05/15/20      PT SHORT TERM GOAL #3   Title Pt will be able to report greater postural awareness with activities at work and recreation.    Time 4    Period Weeks    Status New    Target Date 05/15/20             PT Long Term Goals - 04/17/20 1240      PT LONG TERM GOAL #1   Title FOTO score will improve to 58% ability or more as of last visit    Baseline 51%    Time 8    Period Weeks    Status New    Target Date 06/12/20      PT LONG TERM GOAL #2   Title Pt will be able to report walking with min pain in the neck , 50% of the time, improved from moderate-severe.    Time 8    Period Weeks    Status New    Target Date 06/12/20      PT LONG TERM GOAL #3   Title Pt will be able to report consistent HEP to strengthen upper body and optimize posture    Time 8    Period Weeks    Status New  Target Date 06/12/20      PT LONG TERM GOAL #4   Title Pt will demonstrate pain free AROM of cervical spine in all planes , WFL for IADLs    Time 8    Period Weeks    Status New    Target Date 06/12/20                  Plan - 04/17/20 1246    Clinical Impression Statement Pt presents to PT for low complexity eval of neck and L sided upper trapezius pain and tension. He demonstrates somewhat impaired postural alignment, UE weakness and decrease in cervical AROM.  He complains mostly of pain in neck early in AM or while sleeping.  Pain wakes him from sleep and does improve during the day.  He received a trigger point injection in L upper trap and that has improved but he does continue to have discomfort in a localized area.  He likely uses proximal UE to compensate for L hand, wrist impairments with work activities.  He should expect moderate improvement with skilled  PT intervention.    Personal Factors and Comorbidities Comorbidity 2;Time since onset of injury/illness/exacerbation    Comorbidities cardiac, L UE prosthesis    Examination-Activity Limitations Sleep;Bed Mobility;Caring for Others;Carry;Other;Locomotion Level;Reach Overhead   yardwork   Examination-Participation Restrictions Interpersonal Relationship;Occupation;Community Activity;Yard Work    Stability/Clinical Decision Making Stable/Uncomplicated    Clinical Decision Making Low    Rehab Potential Good    PT Frequency 1x / week    PT Duration 8 weeks    PT Treatment/Interventions ADLs/Self Care Home Management;Cryotherapy;Patient/family education;Therapeutic exercise;Manual techniques;Moist Heat;Electrical Stimulation;Functional mobility training;Therapeutic activities;Taping;Neuromuscular re-education;Dry needling;Passive range of motion    PT Next Visit Plan HEP, UBE, manual/DN    PT Home Exercise Plan [email or web address]    Consulted and Agree with Plan of Care Patient           Patient will benefit from skilled therapeutic intervention in order to improve the following deficits and impairments:  Decreased mobility,Decreased activity tolerance,Impaired flexibility,Impaired UE functional use,Postural dysfunction,Pain,Decreased strength,Increased fascial restricitons,Decreased range of motion,Prosthetic Dependency  Visit Diagnosis: Abnormal posture  Cervicalgia  Cramp and spasm     Problem List Patient Active Problem List   Diagnosis Date Noted  . Trigger point of left shoulder region 03/26/2020  . Degenerative cervical disc 03/26/2020  . Chest pain at rest 08/09/2019  . Epigastric pain 08/09/2019  . S/P aortic valve repair 09/15/2018  . SOB (shortness of breath) 09/12/2018  . Fatigue due to excessive exertion 09/12/2018  . Dizziness   . Heart murmur   . Severe aortic insufficiency   . Chest pain 06/06/2018  . Neck pain 05/31/2018  . Pre-diabetes 05/10/2018  . Deviated septum  10/31/2017  . Conductive hearing loss of right ear with unrestricted hearing of left ear 10/31/2017  . Impacted cerumen 10/31/2017  . Mastoiditis, chronic, right 10/31/2017  . Nasal turbinate hypertrophy 10/31/2017  . Fatty liver 07/20/2017  . Abdominal bloating 07/20/2017  . GERD with esophagitis 06/08/2016  . Essential hypertension 06/08/2016  . 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 Anxiety state 11/15/2014  . CAD (coronary artery disease) 10/30/2014  . Tobacco abuse 10/30/2014  . HLD (hyperlipidemia) 10/30/2014  . History of non-ST elevation myocardial infarction (NSTEMI) 10/28/2014  . Preventative health care 05/29/2014  . Obstructive sleep apnea 05/08/2014  . Cholesteatoma of ear 05/08/2014    Isaac Patel 04/17/2020, 12:58 PM  [facility]  [place], [place], [address] Phone: [phone]   Fax:  [phone]  Name: Isaac Patel MRN: [id number] Date of Birth: 04/03/1965   Raeford Razor, PT 04/17/20 12:58 PM Phone: [phone] Fax: [phone]

## 2020-04-23 DIAGNOSIS — G4733 Obstructive sleep apnea (adult) (pediatric): Secondary | ICD-10-CM

## 2020-04-23 NOTE — Telephone Encounter (Signed)
Received the following message from patient:   "I saw you back in June 2021 and during that visit you gave me a sample of a medium dream wear sleep mask. I have been using it for the past 6-7 months and needless to say it is in need of replacement.  Could you please send an Rx to [employer] for my CPAP supplies and this mask in particular.  It works much better than the [employer] Fx"  I looked at this last office note. It looks like he was given a sample of a DreamWear Full Face mask.   TP, please advise if you are ok with ordering this for him. Thanks!

## 2020-04-24 NOTE — Telephone Encounter (Signed)
Yes that is fine please send order to DME company

## 2020-04-25 NOTE — Telephone Encounter (Signed)
Order has been placed.

## 2020-04-27 ENCOUNTER — Emergency Department (HOSPITAL_COMMUNITY)
Admission: EM | Admit: 2020-04-27 | Discharge: 2020-04-28 | Disposition: A | Discharge status: Home / Self Care | Payer: 59 | Attending: Emergency Medicine | Admitting: Emergency Medicine

## 2020-04-27 ENCOUNTER — Emergency Department (HOSPITAL_COMMUNITY): Payer: 59

## 2020-04-27 ENCOUNTER — Other Ambulatory Visit: Payer: Self-pay

## 2020-04-27 DIAGNOSIS — I2510 Atherosclerotic heart disease of native coronary artery without angina pectoris: Secondary | ICD-10-CM | POA: Insufficient documentation

## 2020-04-27 DIAGNOSIS — I509 Heart failure, unspecified: Secondary | ICD-10-CM | POA: Diagnosis not present

## 2020-04-27 DIAGNOSIS — R109 Unspecified abdominal pain: Secondary | ICD-10-CM | POA: Diagnosis not present

## 2020-04-27 DIAGNOSIS — Z7982 Long term (current) use of aspirin: Secondary | ICD-10-CM | POA: Diagnosis not present

## 2020-04-27 DIAGNOSIS — R0602 Shortness of breath: Secondary | ICD-10-CM | POA: Insufficient documentation

## 2020-04-27 DIAGNOSIS — F17210 Nicotine dependence, cigarettes, uncomplicated: Secondary | ICD-10-CM | POA: Diagnosis not present

## 2020-04-27 DIAGNOSIS — Z79899 Other long term (current) drug therapy: Secondary | ICD-10-CM | POA: Insufficient documentation

## 2020-04-27 DIAGNOSIS — R079 Chest pain, unspecified: Secondary | ICD-10-CM | POA: Diagnosis not present

## 2020-04-27 DIAGNOSIS — I110 Hypertensive heart disease with heart failure: Secondary | ICD-10-CM | POA: Diagnosis not present

## 2020-04-27 DIAGNOSIS — R072 Precordial pain: Secondary | ICD-10-CM | POA: Diagnosis not present

## 2020-04-27 LAB — CBC
HCT: 44.7 % (ref 39.0–52.0)
Hemoglobin: 16.1 g/dL (ref 13.0–17.0)
MCH: 33.1 pg (ref 26.0–34.0)
MCHC: 36 g/dL (ref 30.0–36.0)
MCV: 91.8 fL (ref 80.0–100.0)
Platelets: 219 10*3/uL (ref 150–400)
RBC: 4.87 MIL/uL (ref 4.22–5.81)
RDW: 12.6 % (ref 11.5–15.5)
WBC: 7.3 10*3/uL (ref 4.0–10.5)
nRBC: 0 % (ref 0.0–0.2)

## 2020-04-27 MED ORDER — OXYCODONE-ACETAMINOPHEN 5-325 MG PO TABS
1.0000 | ORAL_TABLET | ORAL | Status: DC | PRN
  Administered 2020-04-27: 1 via ORAL
  Filled 2020-04-27: qty 1

## 2020-04-27 NOTE — ED Triage Notes (Addendum)
Pt presents to ED POV. Pt c/o L CP that radiates towards L arm and back. Pt reports that he has been feeling discomfort all day in chest but it increased 1h ago at rest. Pt has hx of MI w/ stent placement and valve replacements. Pt took nitor x2 at home w/ no relief

## 2020-04-27 NOTE — ED Notes (Signed)
Isaac Patel [phone] wife would like updates when they are possible

## 2020-04-28 DIAGNOSIS — Z79899 Other long term (current) drug therapy: Secondary | ICD-10-CM | POA: Diagnosis not present

## 2020-04-28 DIAGNOSIS — I2510 Atherosclerotic heart disease of native coronary artery without angina pectoris: Secondary | ICD-10-CM | POA: Diagnosis not present

## 2020-04-28 DIAGNOSIS — R072 Precordial pain: Secondary | ICD-10-CM | POA: Diagnosis not present

## 2020-04-28 DIAGNOSIS — I509 Heart failure, unspecified: Secondary | ICD-10-CM | POA: Diagnosis not present

## 2020-04-28 DIAGNOSIS — Z7982 Long term (current) use of aspirin: Secondary | ICD-10-CM | POA: Diagnosis not present

## 2020-04-28 DIAGNOSIS — I110 Hypertensive heart disease with heart failure: Secondary | ICD-10-CM | POA: Diagnosis not present

## 2020-04-28 DIAGNOSIS — R0602 Shortness of breath: Secondary | ICD-10-CM | POA: Diagnosis not present

## 2020-04-28 DIAGNOSIS — R109 Unspecified abdominal pain: Secondary | ICD-10-CM | POA: Diagnosis not present

## 2020-04-28 DIAGNOSIS — F17210 Nicotine dependence, cigarettes, uncomplicated: Secondary | ICD-10-CM | POA: Diagnosis not present

## 2020-04-28 LAB — BASIC METABOLIC PANEL
Anion gap: 10 (ref 5–15)
BUN: 18 mg/dL (ref 6–20)
CO2: 23 mmol/L (ref 22–32)
Calcium: 8.9 mg/dL (ref 8.9–10.3)
Chloride: 106 mmol/L (ref 98–111)
Creatinine, Ser: 0.99 mg/dL (ref 0.61–1.24)
GFR, Estimated: >60 mL/min (ref >60)
Glucose, Bld: 101 mg/dL — ABNORMAL HIGH (ref 70–99)
Potassium: 3.6 mmol/L (ref 3.5–5.1)
Sodium: 139 mmol/L (ref 135–145)

## 2020-04-28 LAB — TROPONIN I (HIGH SENSITIVITY)
Troponin I (High Sensitivity): 5 ng/L (ref <18)
Troponin I (High Sensitivity): 5 ng/L (ref <18)

## 2020-04-28 NOTE — ED Provider Notes (Signed)
[facility]ERGENCY DEPARTMENT Provider Note   CSN: [id number] Arrival date & time: 04/27/20  2240     History Chief Complaint  Patient presents with  . Chest Pain    Isaac Patel is a 55 y.o. male.  The history is provided by the patient.  Chest Pain Pain location:  L chest Pain quality: aching and dull   Pain radiates to:  L shoulder and L arm Pain severity:  Moderate Onset quality:  Gradual Duration:  1 hour Timing:  Constant Progression:  Improving Chronicity:  New Relieved by:  None tried Worsened by:  Nothing Associated symptoms: abdominal pain and shortness of breath   Associated symptoms: no cough, no diaphoresis, no fever, no lower extremity edema and no vomiting   Risk factors: coronary artery disease, male sex and smoking   Risk factors: no prior DVT/PE     HPI: A 55 year old patient with a history of hypertension and hypercholesterolemia presents for evaluation of chest pain. Initial onset of pain was approximately 3-6 hours ago. The patient's chest pain is not worse with exertion. The patient's chest pain is middle- or left-sided, is not well-localized, is not described as heaviness/pressure/tightness, is not sharp and does radiate to the arms/jaw/neck. The patient does not complain of nausea and denies diaphoresis. The patient has smoked in the past 90 days. The patient has no history of stroke, has no history of peripheral artery disease, denies any history of treated diabetes, has no relevant family history of coronary artery disease (first degree relative at less than age 51) and does not have an elevated BMI (>=30).  Patient reports onset of chest pain while at rest.  He reports it was a dull ache that started the left chest and radiates to his shoulder and left arm.  Reports mild shortness of breath.  No pleuritic pain.  There is no ripping or tearing sensation.  He reports recent post nasal drip He reports generalized fatigue recently,  but no other new issues.  No recent dyspnea on exertion.  Past Medical History:  Diagnosis Date  . Aortic insufficiency    Status post aortic valve repair // Echo 8/20: EF 50-55, moderate LVH, grade 1 diastolic dysfunction, normal RV SF, status post AV repair, mild AI, mild dilation of aortic root (38 mm)  . CAD (coronary artery disease)    a.  Non-STEMI 8/16: LHC-proximal LAD 80% treated with a resolute DES, EF 50-55%;  b.  Echo 8/16:  Moderate LVH, EF 55-60%, normal wall motion, normal diastolic function, normal RV function  . CHF (congestive heart failure) ([place])   . Dyspnea    mild  . GERD (gastroesophageal reflux disease)   . Headache    "maybe monthly" (10/28/2014)  . HLD (hyperlipidemia)   . Hypertension   . NSTEMI (non-ST elevated myocardial infarction) ([place]) 10/28/2014  . OSA (obstructive sleep apnea)    "tried mask; wear it off and on" (10/28/2014)  . S/P aortic valve repair 09/15/2018   Complex valvuloplasty including plication of prolapsing right coronary cusp with Biostable HAART 300 aortic annuloplasty, size 21 mm    Patient Active Problem List   Diagnosis Date Noted  . Trigger point of left shoulder region 03/26/2020  . Degenerative cervical disc 03/26/2020  . Chest pain at rest 08/09/2019  . Epigastric pain 08/09/2019  . S/P aortic valve repair 09/15/2018  . SOB (shortness of breath) 09/12/2018  . Fatigue due to excessive exertion 09/12/2018  . Dizziness   . Heart murmur   .  Severe aortic insufficiency   . Chest pain 06/06/2018  . Neck pain 05/31/2018  . Pre-diabetes 05/10/2018  . Deviated septum 10/31/2017  . Conductive hearing loss of right ear with unrestricted hearing of left ear 10/31/2017  . Impacted cerumen 10/31/2017  . Mastoiditis, chronic, right 10/31/2017  . Nasal turbinate hypertrophy 10/31/2017  . Fatty liver 07/20/2017  . Abdominal bloating 07/20/2017  . GERD with esophagitis 06/08/2016  . Essential hypertension 06/08/2016  . 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 Anxiety state 11/15/2014  . CAD (coronary artery disease) 10/30/2014  . Tobacco abuse 10/30/2014  . HLD (hyperlipidemia) 10/30/2014  . History of non-ST elevation myocardial infarction (NSTEMI) 10/28/2014  . Preventative health care 05/29/2014  . Obstructive sleep apnea 05/08/2014  . 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 AORTIC VALVE REPAIR N/A 09/15/2018   Procedure: AORTIC VALVE REPAIR USING HAART 300 AORTIC ANNULOPLASTY DEVICE SIZE 21MM;  Surgeon: Rexene Alberts, MD;  Location: [facility];  Service: Open Heart Surgery;  Laterality: N/A;  . 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 CARDIAC CATHETERIZATION N/A 10/29/2014   Procedure: Left Heart Cath and Coronary Angiography;  Surgeon: Troy Sine, MD;  Location: [facility];  Service: Cardiovascular;  Laterality: N/A;  . CHOLESTEATOMA EXCISION Right 1990's  . LEFT HEART CATH AND CORONARY ANGIOGRAPHY N/A 05/25/2016   Procedure: Left Heart Cath and Coronary Angiography;  Surgeon: Sherren Mocha, MD;  Location: [facility];  Service: Cardiovascular;  Laterality: N/A;  . MULTIPLE TOOTH EXTRACTIONS  08/29/2018  . NASAL SEPTOPLASTY W/ TURBINOPLASTY Bilateral 11/30/2017   Procedure: NASAL SEPTOPLASTY WITH TURBINATE REDUCTION;  Surgeon: Jerrell Belfast, MD;  Location: [facility];  Service: ENT;  Laterality: Bilateral;  . RIGHT/LEFT HEART CATH AND CORONARY ANGIOGRAPHY N/A 08/11/2018   Procedure: RIGHT/LEFT HEART CATH AND CORONARY ANGIOGRAPHY;  Surgeon: Burnell Blanks, MD;  Location: [facility];  Service: Cardiovascular;  Laterality: N/A;  . TEE WITHOUT CARDIOVERSION N/A 06/08/2018   Procedure: TRANSESOPHAGEAL ECHOCARDIOGRAM (TEE);  Surgeon: Buford Dresser, MD;  Location: [facility] ENDOSCOPY;  Service: Cardiovascular;  Laterality: N/A;  . TEE WITHOUT CARDIOVERSION N/A 09/15/2018   Procedure: TRANSESOPHAGEAL ECHOCARDIOGRAM  (TEE);  Surgeon: Rexene Alberts, MD;  Location: [facility];  Service: Open Heart Surgery;  Laterality: N/A;  . TYMPANOSTOMY TUBE PLACEMENT Bilateral "as a kid"   "had one done as an adult too"  . WISDOM TOOTH EXTRACTION         Family History  Problem Relation Age of Onset  . Diverticulitis Mother   . Skin cancer Mother   . Healthy Father   . Bradycardia Father   . Multiple sclerosis Maternal Grandfather   . Colon cancer Neg Hx   . Stomach cancer Neg Hx   . Rectal cancer Neg Hx   . Liver cancer Neg Hx   . Esophageal cancer Neg Hx     Social History   Tobacco Use  . Smoking status: Current Every Day Smoker    Packs/day: 0.33    Years: 32.00    Pack years: 10.56    Types: Cigarettes    Last attempt to quit: 07/28/2018    Years since quitting: 1.7  . Smokeless tobacco: Never Used  . Tobacco comment: 6 cigarettes per day 08/28/19 [facility]   Vaping Use  . Vaping Use: Former  Substance Use Topics  . Alcohol use: No    Alcohol/week:  2.0 - 3.0 standard drinks    Types: 2 - 3 Standard drinks or equivalent per week    Comment: stopped 06/19/2015,   . Drug use: Not Currently    Types: Marijuana    Comment: 05/24/2016  "stopped marijuana in the early 2000's"    Home Medications Prior to Admission medications   Medication Sig Start Date End Date Taking? Authorizing Provider  aspirin EC 81 MG tablet Take 81 mg by mouth daily.    [provider]  atorvastatin (LIPITOR) 80 MG tablet Take 1 tablet (80 mg total) by mouth daily at 6 PM. 04/02/20   Turner, Eber Hong, MD  calcium carbonate (TUMS - DOSED IN MG ELEMENTAL CALCIUM) 500 MG chewable tablet Chew 2 tablets by mouth daily as needed for indigestion or heartburn.    [provider]  fluticasone (FLONASE) 50 MCG/ACT nasal spray Place 1 spray into both nostrils daily as needed for allergies or rhinitis.    [provider]  gabapentin (NEURONTIN) 100 MG capsule Take 2 capsules (200 mg total) by mouth at bedtime. 03/26/20    Lyndal Pulley, DO  loratadine (CLARITIN) 10 MG tablet Take 10 mg by mouth daily.    [provider]  metoprolol tartrate (LOPRESSOR) 25 MG tablet Take 1 tablet (25 mg total) by mouth 2 (two) times daily. 04/02/20   Sueanne Margarita, MD  Multiple Vitamin (MULTI-VITAMIN PO) Take 1 tablet by mouth daily.    [provider]  nitroGLYCERIN (NITROSTAT) 0.4 MG SL tablet Place 1 tablet (0.4 mg total) under the tongue every 5 (five) minutes x 3 doses as needed for chest pain. 01/03/19   Richardson Dopp T, PA-C  pantoprazole (PROTONIX) 40 MG tablet TAKE 1 TABLET (40 MG TOTAL) BY MOUTH DAILY. 04/03/20   Biagio Borg, MD    Allergies    Morphine and related  Review of Systems   Review of Systems  Constitutional: Negative for diaphoresis and fever.  Respiratory: Positive for shortness of breath. Negative for cough.   Cardiovascular: Positive for chest pain. Negative for leg swelling.  Gastrointestinal: Positive for abdominal pain. Negative for vomiting.       Chronic abdominal pain  All other systems reviewed and are negative.   Physical Exam Updated Vital Signs BP 128/70 (BP Location: Right Arm)   Pulse 73   Temp (!) 97.3 F (36.3 C) (Oral)   Resp 20   SpO2 99%   Physical Exam CONSTITUTIONAL: Well developed/well nourished HEAD: Normocephalic/atraumatic EYES: EOMI/PERRL ENMT: Mucous membranes moist NECK: supple no meningeal signs SPINE/BACK:entire spine nontender CV: S1/S2 noted, no loud harsh murmurs LUNGS: Lungs are clear to auscultation bilaterally, no apparent distress ABDOMEN: soft, nontender, no rebound or guarding, bowel sounds noted throughout abdomen GU:no cva tenderness NEURO: Pt is awake/alert/appropriate, moves all extremitiesx4.  No facial droop.   EXTREMITIES: pulses normal/equalx4, full ROM, previous left arm amputation noted.  No calf tenderness is noted SKIN: warm, color normal PSYCH: no abnormalities of mood noted, alert and oriented to situation  ED  Results / Procedures / Treatments   Labs (all labs ordered are listed, but only abnormal results are displayed) Labs Reviewed  BASIC METABOLIC PANEL - Abnormal; Notable for the following components:      Result Value   Glucose, Bld 101 (*)    All other components within normal limits  CBC  TROPONIN I (HIGH SENSITIVITY)  TROPONIN I (HIGH SENSITIVITY)    EKG EKG Interpretation  Date/Time:  Sunday April 27 2020  22:52:12 EST Ventricular Rate:  75 PR Interval:  142 QRS Duration: 86 QT Interval:  394 QTC Calculation: 439 R Axis:   53 Text Interpretation: Normal sinus rhythm Normal ECG No significant change since last tracing Confirmed by Ripley Fraise [phone]) on 04/28/2020 3:54:57 AM   Radiology DG Chest 2 View  Result Date: 04/27/2020 CLINICAL DATA:  Chest pain radiating to the left arm and back EXAM: CHEST - 2 VIEW COMPARISON:  08/14/2019, 10/16/2018 FINDINGS: Postsurgical changes from prior sternotomy. Intact and aligned sternal sutures. Cardiomediastinal contours are unremarkable. No consolidation, features of edema, pneumothorax, or effusion. No acute osseous or soft tissue abnormality. IMPRESSION: No acute cardiopulmonary abnormality. Electronically Signed   By: Lovena Le M.D.   On: 04/27/2020 23:19    Procedures Procedures   Medications Ordered in ED Medications  oxyCODONE-acetaminophen (PERCOCET/ROXICET) 5-325 MG per tablet 1 tablet (1 tablet Oral Given 04/27/20 2310)    ED Course  I have reviewed the triage vital signs and the nursing notes.  Pertinent labs & imaging results that were available during my care of the patient were reviewed by me and considered in my medical decision making (see chart for details).    MDM Rules/Calculators/A&P HEAR Score: 4                        4:26 AM Patient with history of CAD and aortic valve replacement presenting with chest pain. He reports the pain felt like a dull ache that radiated to the shoulder.  He reports his  previous non-STEMI felt like heartburn and this pain is different. It was already improving by the time he came to the ER.  He is nearly pain-free. Patient is in no acute distress at this time. Low suspicion for PE/dissection at this time.   I discussion with patient and his wife and I gave the option of admission due to his history of CAD and aortic valve repair, the patient would like to be discharged home.  This seems to be a reasonable plan.  I did suggest a cardiology follow-up in the next 2 weeks due to this ongoing fatigue. We discussed strict return precautions. Final Clinical Impression(s) / ED Diagnoses Final diagnoses:  Precordial pain    Rx / DC Orders ED Discharge Orders    None       Ripley Fraise, MD 04/28/20 [phone]

## 2020-04-28 NOTE — ED Notes (Signed)
Pt and family verbalized understanding of d/c instructions, follow up and medications.  Pt ambulatory to WR, steady gait, with wife

## 2020-04-28 NOTE — ED Notes (Signed)
Updated patients wife, she will be in room momentarily

## 2020-04-28 NOTE — Discharge Instructions (Addendum)

## 2020-04-30 ENCOUNTER — Encounter: Payer: Self-pay | Admitting: Internal Medicine

## 2020-04-30 ENCOUNTER — Other Ambulatory Visit: Payer: Self-pay

## 2020-04-30 ENCOUNTER — Other Ambulatory Visit: Payer: Self-pay | Admitting: Internal Medicine

## 2020-04-30 ENCOUNTER — Ambulatory Visit: Payer: 59 | Admitting: Internal Medicine

## 2020-04-30 VITALS — BP 124/76 | HR 54 | Temp 97.5°F | Ht 71.0 in | Wt 200.0 lb

## 2020-04-30 DIAGNOSIS — R079 Chest pain, unspecified: Secondary | ICD-10-CM

## 2020-04-30 DIAGNOSIS — R1013 Epigastric pain: Secondary | ICD-10-CM

## 2020-04-30 DIAGNOSIS — I10 Essential (primary) hypertension: Secondary | ICD-10-CM | POA: Diagnosis not present

## 2020-04-30 DIAGNOSIS — M5412 Radiculopathy, cervical region: Secondary | ICD-10-CM

## 2020-04-30 DIAGNOSIS — R202 Paresthesia of skin: Secondary | ICD-10-CM

## 2020-04-30 DIAGNOSIS — M545 Low back pain, unspecified: Secondary | ICD-10-CM

## 2020-04-30 DIAGNOSIS — Z23 Encounter for immunization: Secondary | ICD-10-CM

## 2020-04-30 MED ORDER — PREDNISONE 10 MG PO TABS: ORAL_TABLET | ORAL | 0 refills | Status: DC

## 2020-04-30 NOTE — Progress Notes (Signed)
Established Patient Office Visit  Subjective:  [name], male    DOB: October 23, 1965  Age: 55 y.o. MRN: [id number]       Chief Complaint: follow up left neck and chest pain, found to have o2 89% on walking in, but not reproduced at rest or with exertion this visit        HPI:  Isaac Patel is a 55 y.o. male here with c/o left neck pain burning like with radiation to the LUE with numbnes, pain and slight weakness for the past wk.  Worse to move the neck, nothing else makes better or worse.  Also c/o lower back pain with bilateral LE numbness paresthesias but no other pain or weakness, falls.  Also recenlty had transient URI symptoms x 2 wks, home covid neg.  Has seen sport med for neck pain dn referred for PT, and already had 1st tx, not sure if didn't make this worse.  Also seen in ED jan 3 with cardiac rules out  Overall states the neck pain is worse than the LE symtpoms   Due for shingles shot #2 Wt Readings from Last 3 Encounters:  04/30/20 200 lb (90.7 kg)  03/26/20 201 lb (91.2 kg)  12/06/19 206 lb (93.4 kg)   BP Readings from Last 3 Encounters:  04/30/20 124/76  04/28/20 126/83  03/26/20 132/86         Past Medical History:  Diagnosis Date  . Aortic insufficiency    Status post aortic valve repair // Echo 8/20: EF 50-55, moderate LVH, grade 1 diastolic dysfunction, normal RV SF, status post AV repair, mild AI, mild dilation of aortic root (38 mm)  . CAD (coronary artery disease)    a.  Non-STEMI 8/16: LHC-proximal LAD 80% treated with a resolute DES, EF 50-55%;  b.  Echo 8/16:  Moderate LVH, EF 55-60%, normal wall motion, normal diastolic function, normal RV function  . CHF (congestive heart failure) ([place])   . Dyspnea    mild  . GERD (gastroesophageal reflux disease)   . Headache    "maybe monthly" (10/28/2014)  . HLD (hyperlipidemia)   . Hypertension   . NSTEMI (non-ST elevated myocardial infarction) ([place]) 10/28/2014  . OSA (obstructive sleep apnea)     "tried mask; wear it off and on" (10/28/2014)  . 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 AORTIC VALVE REPAIR N/A 09/15/2018   Procedure: AORTIC VALVE REPAIR USING HAART 300 AORTIC ANNULOPLASTY DEVICE SIZE 21MM;  Surgeon: Rexene Alberts, MD;  Location: [facility] [place] [facility];  Service: Open Heart Surgery;  Laterality: N/A;  . 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 CARDIAC CATHETERIZATION N/A 10/29/2014   Procedure: Left Heart Cath and Coronary Angiography;  Surgeon: Troy Sine, MD;  Location: [facility];  Service: Cardiovascular;  Laterality: N/A;  . CHOLESTEATOMA EXCISION Right 1990's  . LEFT HEART CATH AND CORONARY ANGIOGRAPHY N/A 05/25/2016   Procedure: Left Heart Cath and Coronary Angiography;  Surgeon: Sherren Mocha, MD;  Location: [facility];  Service: Cardiovascular;  Laterality: N/A;  . MULTIPLE TOOTH EXTRACTIONS  08/29/2018  . NASAL SEPTOPLASTY W/ TURBINOPLASTY Bilateral 11/30/2017   Procedure: NASAL SEPTOPLASTY WITH TURBINATE REDUCTION;  Surgeon: Jerrell Belfast, MD;  Location: [facility];  Service: ENT;  Laterality: Bilateral;  . RIGHT/LEFT HEART  CATH AND CORONARY ANGIOGRAPHY N/A 08/11/2018   Procedure: RIGHT/LEFT HEART CATH AND CORONARY ANGIOGRAPHY;  Surgeon: Burnell Blanks, MD;  Location: [facility];  Service: Cardiovascular;  Laterality: N/A;  . TEE WITHOUT CARDIOVERSION N/A 06/08/2018   Procedure: TRANSESOPHAGEAL ECHOCARDIOGRAM (TEE);  Surgeon: Buford Dresser, MD;  Location: [facility] ENDOSCOPY;  Service: Cardiovascular;  Laterality: N/A;  . TEE WITHOUT CARDIOVERSION N/A 09/15/2018   Procedure: TRANSESOPHAGEAL ECHOCARDIOGRAM (TEE);  Surgeon: Rexene Alberts, MD;  Location: [facility];  Service: Open Heart Surgery;  Laterality: N/A;  . TYMPANOSTOMY TUBE PLACEMENT  Bilateral "as a kid"   "had one done as an adult too"  . WISDOM TOOTH EXTRACTION      reports that he has been smoking cigarettes. He has a 10.56 pack-year smoking history. He has never used smokeless tobacco. He reports previous drug use. Drug: Marijuana. He reports that he does not drink alcohol. family history includes Bradycardia in his father; Diverticulitis in his mother; Healthy in his father; Multiple sclerosis in his maternal grandfather; Skin cancer in his mother. Allergies  Allergen Reactions  . Morphine And Related Itching   Current Outpatient Medications on File Prior to Visit  Medication Sig Dispense Refill  . aspirin EC 81 MG tablet Take 81 mg by mouth daily.    Marland Kitchen atorvastatin (LIPITOR) 80 MG tablet Take 1 tablet (80 mg total) by mouth daily at 6 PM. 90 tablet 1  . calcium carbonate (TUMS - DOSED IN MG ELEMENTAL CALCIUM) 500 MG chewable tablet Chew 2 tablets by mouth daily as needed for indigestion or heartburn.    . fluticasone (FLONASE) 50 MCG/ACT nasal spray Place 1 spray into both nostrils daily as needed for allergies or rhinitis.    Marland Kitchen gabapentin (NEURONTIN) 100 MG capsule Take 2 capsules (200 mg total) by mouth at bedtime. 180 capsule 0  . loratadine (CLARITIN) 10 MG tablet Take 10 mg by mouth daily.    . metoprolol tartrate (LOPRESSOR) 25 MG tablet Take 1 tablet (25 mg total) by mouth 2 (two) times daily. 180 tablet 1  . Multiple Vitamin (MULTI-VITAMIN PO) Take 1 tablet by mouth daily.    . nitroGLYCERIN (NITROSTAT) 0.4 MG SL tablet Place 1 tablet (0.4 mg total) under the tongue every 5 (five) minutes x 3 doses as needed for chest pain. 25 tablet 3  . pantoprazole (PROTONIX) 40 MG tablet TAKE 1 TABLET (40 MG TOTAL) BY MOUTH DAILY. 90 tablet 1   No current facility-administered medications on file prior to visit.        ROS:  All others reviewed and negative.  Objective        PE:  BP 124/76   Pulse (!) 54   Temp (!) 97.5 F (36.4 C) (Oral)   Ht 5\' 11"  (1.803 m)    Wt 200 lb (90.7 kg)   SpO2 (!) 89%   BMI 27.89 kg/m                 Constitutional: Pt appears in NAD               HENT: Head: NCAT.                Right Ear: External ear normal.                 Left Ear: External ear normal.                Eyes: . Pupils are equal, round, and reactive to light.  Conjunctivae and EOM are normal               Nose: without d/c or deformity               Neck: Neck supple. Gross normal ROM               Cardiovascular: Normal rate and regular rhythm.                 Pulmonary/Chest: Effort normal and breath sounds without rales or wheezing.                Abd:  Soft, NT, ND, + BS, no organomegaly               Neurological: Pt is alert. At baseline orientation, motor intact except 4+/5  LUE               Skin: Skin is warm. No rashes, no other new lesions, LE edema - none               Psychiatric: Pt behavior is normal without agitation   Assessment/Plan:  Isaac Patel is a 55 y.o. White or Caucasian [1] male with  has a past medical history of Aortic insufficiency, CAD (coronary artery disease), CHF (congestive heart failure) ([place]), Dyspnea, GERD (gastroesophageal reflux disease), Headache, HLD (hyperlipidemia), Hypertension, NSTEMI (non-ST elevated myocardial infarction) ([place]) (10/28/2014), OSA (obstructive sleep apnea), and S/P aortic valve repair (09/15/2018).   Micro: none  Cardiac tracings I have personally interpreted today:  none  Pertinent Radiological findings (summarize): none   Lab Results  Component Value Date   WBC 7.3 04/27/2020   HGB 16.1 04/27/2020   HCT 44.7 04/27/2020   PLT 219 04/27/2020   GLUCOSE 101 (H) 04/27/2020   CHOL 113 12/10/2019   TRIG 86.0 12/10/2019   HDL 38.70 (L) 12/10/2019   LDLCALC 57 12/10/2019   ALT 17 12/10/2019   AST 15 12/10/2019   NA 139 04/27/2020   K 3.6 04/27/2020   CL 106 04/27/2020   CREATININE 0.99 04/27/2020   BUN 18 04/27/2020   CO2 23 04/27/2020   TSH 1.64 12/10/2019   PSA 0.89  12/10/2019   INR 1.4 (H) 09/15/2018   HGBA1C 6.1 12/10/2019     Assessment & Plan:   Problem List Items Addressed This Visit      Medium   Left cervical radiculopathy - Primary    1 wk worsening, for gabapentin 200 qhs and 1 qam, predpac asd, and MRI      Relevant Orders   MR Cervical Spine Wo Contrast   Ambulatory referral to Neurology   Essential hypertension    BP Readings from Last 3 Encounters:  04/30/20 124/76  04/28/20 126/83  03/26/20 132/86   Stable, pt to continue medical treatment lopressor  * Current Outpatient Medications (Endocrine & Metabolic):  .  predniSONE (DELTASONE) 10 MG tablet, 3 tabs by mouth per day for 3 days,2tabs per day for 3 days,1tab per day for 3 days  Current Outpatient Medications (Cardiovascular):  .  atorvastatin (LIPITOR) 80 MG tablet, Take 1 tablet (80 mg total) by mouth daily at 6 PM. .  metoprolol tartrate (LOPRESSOR) 25 MG tablet, Take 1 tablet (25 mg total) by mouth 2 (two) times daily. .  nitroGLYCERIN (NITROSTAT) 0.4 MG SL tablet, Place 1 tablet (0.4 mg total) under the tongue every 5 (five) minutes x 3 doses as needed for chest pain.  Current Outpatient Medications (Respiratory):  .  fluticasone (FLONASE) 50 MCG/ACT nasal spray, Place 1 spray into both nostrils daily as needed for allergies or rhinitis. Marland Kitchen  loratadine (CLARITIN) 10 MG tablet, Take 10 mg by mouth daily.  Current Outpatient Medications (Analgesics):  .  aspirin EC 81 MG tablet, Take 81 mg by mouth daily.   Current Outpatient Medications (Other):  .  calcium carbonate (TUMS - DOSED IN MG ELEMENTAL CALCIUM) 500 MG chewable tablet, Chew 2 tablets by mouth daily as needed for indigestion or heartburn. .  gabapentin (NEURONTIN) 100 MG capsule, Take 2 capsules (200 mg total) by mouth at bedtime. .  Multiple Vitamin (MULTI-VITAMIN PO), Take 1 tablet by mouth daily. .  pantoprazole (PROTONIX) 40 MG tablet, TAKE 1 TABLET (40 MG TOTAL) BY MOUTH DAILY.         Low    Midline low back pain    Neck pain is more concerning for now, but may need MRI ls spine as well, to see neurology      Relevant Medications   predniSONE (DELTASONE) 10 MG tablet   Other Relevant Orders   Ambulatory referral to Neurology   Bilateral leg paresthesia    Suspect is lower back related, for neurology referral as well      Relevant Orders   Ambulatory referral to Neurology    Other Visit Diagnoses    Need for shingles vaccine       Relevant Orders   Varicella-zoster vaccine IM (Completed)      Meds ordered this encounter  Medications  . predniSONE (DELTASONE) 10 MG tablet    Sig: 3 tabs by mouth per day for 3 days,2tabs per day for 3 days,1tab per day for 3 days    Dispense:  18 tablet    Refill:  0    Follow-up: Return in about 3 months (around 07/28/2020).   Cathlean Cower, MD 04/30/2020 3:18 PM [facility]

## 2020-04-30 NOTE — Patient Instructions (Addendum)
Please take all new medication as prescribed - the prednisone  You will be contacted regarding the referral for: Neurology, and MRI for the cervical spine  Neurology may want to add MRI for the Lower back due to the leg symptoms  Please be careful with PT, as you may want to hold if it makes your symptoms worse  OK for the Shingles Shot #2 today  Please continue all other medications as before, and refills have been done if requested.  Please have the pharmacy call with any other refills you may need.  Please keep your appointments with your specialists as you may have planned  Please make an Appointment to return in 3 months

## 2020-05-01 DIAGNOSIS — G4733 Obstructive sleep apnea (adult) (pediatric): Secondary | ICD-10-CM | POA: Diagnosis not present

## 2020-05-02 ENCOUNTER — Ambulatory Visit: Payer: 59 | Admitting: Physical Therapy

## 2020-05-04 ENCOUNTER — Encounter: Payer: Self-pay | Admitting: Internal Medicine

## 2020-05-04 DIAGNOSIS — R202 Paresthesia of skin: Secondary | ICD-10-CM | POA: Insufficient documentation

## 2020-05-04 DIAGNOSIS — M5412 Radiculopathy, cervical region: Secondary | ICD-10-CM | POA: Insufficient documentation

## 2020-05-04 DIAGNOSIS — M545 Low back pain, unspecified: Secondary | ICD-10-CM | POA: Insufficient documentation

## 2020-05-04 NOTE — Assessment & Plan Note (Signed)
Neck pain is more concerning for now, but may need MRI ls spine as well, to see neurology

## 2020-05-04 NOTE — Assessment & Plan Note (Signed)
1 wk worsening, for gabapentin 200 qhs and 1 qam, predpac asd, and MRI

## 2020-05-04 NOTE — Assessment & Plan Note (Signed)
Suspect is lower back related, for neurology referral as well

## 2020-05-04 NOTE — Assessment & Plan Note (Signed)
BP Readings from Last 3 Encounters:  04/30/20 124/76  04/28/20 126/83  03/26/20 132/86   Stable, pt to continue medical treatment lopressor  * Current Outpatient Medications (Endocrine & Metabolic):  .  predniSONE (DELTASONE) 10 MG tablet, 3 tabs by mouth per day for 3 days,2tabs per day for 3 days,1tab per day for 3 days  Current Outpatient Medications (Cardiovascular):  .  atorvastatin (LIPITOR) 80 MG tablet, Take 1 tablet (80 mg total) by mouth daily at 6 PM. .  metoprolol tartrate (LOPRESSOR) 25 MG tablet, Take 1 tablet (25 mg total) by mouth 2 (two) times daily. .  nitroGLYCERIN (NITROSTAT) 0.4 MG SL tablet, Place 1 tablet (0.4 mg total) under the tongue every 5 (five) minutes x 3 doses as needed for chest pain.  Current Outpatient Medications (Respiratory):  .  fluticasone (FLONASE) 50 MCG/ACT nasal spray, Place 1 spray into both nostrils daily as needed for allergies or rhinitis. Marland Kitchen  loratadine (CLARITIN) 10 MG tablet, Take 10 mg by mouth daily.  Current Outpatient Medications (Analgesics):  .  aspirin EC 81 MG tablet, Take 81 mg by mouth daily.   Current Outpatient Medications (Other):  .  calcium carbonate (TUMS - DOSED IN MG ELEMENTAL CALCIUM) 500 MG chewable tablet, Chew 2 tablets by mouth daily as needed for indigestion or heartburn. .  gabapentin (NEURONTIN) 100 MG capsule, Take 2 capsules (200 mg total) by mouth at bedtime. .  Multiple Vitamin (MULTI-VITAMIN PO), Take 1 tablet by mouth daily. .  pantoprazole (PROTONIX) 40 MG tablet, TAKE 1 TABLET (40 MG TOTAL) BY MOUTH DAILY.

## 2020-05-07 ENCOUNTER — Other Ambulatory Visit: Payer: Self-pay

## 2020-05-07 ENCOUNTER — Encounter: Payer: Self-pay | Admitting: Physical Therapy

## 2020-05-07 ENCOUNTER — Ambulatory Visit: Payer: 59 | Admitting: Family Medicine

## 2020-05-07 ENCOUNTER — Ambulatory Visit: Payer: 59 | Attending: Family Medicine | Admitting: Physical Therapy

## 2020-05-07 DIAGNOSIS — R293 Abnormal posture: Secondary | ICD-10-CM | POA: Diagnosis not present

## 2020-05-07 DIAGNOSIS — R252 Cramp and spasm: Secondary | ICD-10-CM | POA: Insufficient documentation

## 2020-05-07 DIAGNOSIS — M542 Cervicalgia: Secondary | ICD-10-CM | POA: Insufficient documentation

## 2020-05-07 NOTE — Therapy (Signed)
[place], [place], [address] Phone: [phone]   Fax:  [phone]  Physical Therapy Treatment  Patient Details  Name: Isaac Patel MRN: [id number] Date of Birth: September 08, 1965 Referring Provider (PT): Dr. Hulan Saas   Encounter Date: 05/07/2020   PT End of Session - 05/07/20 1429    Visit Number 2    Number of Visits 8    Date for PT Re-Evaluation 06/13/20    Authorization Type UMR    PT Start Time 0931    PT Stop Time 1015    PT Time Calculation (min) 44 min    Activity Tolerance Patient tolerated treatment well    Behavior During Therapy [facility] for tasks assessed/performed           Past Medical History:  Diagnosis Date  . Aortic insufficiency    Status post aortic valve repair // Echo 8/20: EF 50-55, moderate LVH, grade 1 diastolic dysfunction, normal RV SF, status post AV repair, mild AI, mild dilation of aortic root (38 mm)  . CAD (coronary artery disease)    a.  Non-STEMI 8/16: LHC-proximal LAD 80% treated with a resolute DES, EF 50-55%;  b.  Echo 8/16:  Moderate LVH, EF 55-60%, normal wall motion, normal diastolic function, normal RV function  . CHF (congestive heart failure) ([place])   . Dyspnea    mild  . GERD (gastroesophageal reflux disease)   . Headache    "maybe monthly" (10/28/2014)  . HLD (hyperlipidemia)   . Hypertension   . NSTEMI (non-ST elevated myocardial infarction) ([place]) 10/28/2014  . OSA (obstructive sleep apnea)    "tried mask; wear it off and on" (10/28/2014)  . S/P aortic valve repair 09/15/2018   Complex valvuloplasty including plication of prolapsing right coronary cusp with Biostable HAART 300 aortic annuloplasty, size 21 mm    Past Surgical History:  Procedure Laterality Date  . AORTIC VALVE REPAIR N/A 09/15/2018   Procedure: AORTIC VALVE REPAIR USING HAART 300 AORTIC ANNULOPLASTY DEVICE SIZE 21MM;  Surgeon: Rexene Alberts, MD;  Location: [place];  Service: Open Heart  Surgery;  Laterality: N/A;  . 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 CARDIAC CATHETERIZATION N/A 10/29/2014   Procedure: Left Heart Cath and Coronary Angiography;  Surgeon: Troy Sine, MD;  Location: [place] CV LAB;  Service: Cardiovascular;  Laterality: N/A;  . CHOLESTEATOMA EXCISION Right 1990's  . LEFT HEART CATH AND CORONARY ANGIOGRAPHY N/A 05/25/2016   Procedure: Left Heart Cath and Coronary Angiography;  Surgeon: Sherren Mocha, MD;  Location: [place] CV LAB;  Service: Cardiovascular;  Laterality: N/A;  . MULTIPLE TOOTH EXTRACTIONS  08/29/2018  . NASAL SEPTOPLASTY W/ TURBINOPLASTY Bilateral 11/30/2017   Procedure: NASAL SEPTOPLASTY WITH TURBINATE REDUCTION;  Surgeon: Jerrell Belfast, MD;  Location: [place];  Service: ENT;  Laterality: Bilateral;  . RIGHT/LEFT HEART CATH AND CORONARY ANGIOGRAPHY N/A 08/11/2018   Procedure: RIGHT/LEFT HEART CATH AND CORONARY ANGIOGRAPHY;  Surgeon: Burnell Blanks, MD;  Location: [place] CV LAB;  Service: Cardiovascular;  Laterality: N/A;  . TEE WITHOUT CARDIOVERSION N/A 06/08/2018   Procedure: TRANSESOPHAGEAL ECHOCARDIOGRAM (TEE);  Surgeon: Buford Dresser, MD;  Location: [facility] ENDOSCOPY;  Service: Cardiovascular;  Laterality: N/A;  . TEE WITHOUT CARDIOVERSION N/A 09/15/2018   Procedure: TRANSESOPHAGEAL ECHOCARDIOGRAM (TEE);  Surgeon: Rexene Alberts, MD;  Location: [place];  Service: Open Heart Surgery;  Laterality: N/A;  . TYMPANOSTOMY TUBE PLACEMENT Bilateral "as a kid"   "had one  done as an adult too"  . WISDOM TOOTH EXTRACTION      There were no vitals filed for this visit.   Subjective Assessment - 05/07/20 1421    Subjective Still noting neck pain with radiating into left upper trapezius region and upper arm and is concerned over potential cervical nerve root impingment. Pt. is scheduled for cervical MRI 05/26/20. Of note pt. went to ED 04/27/20 with symptoms of chest pain with radiating into left UE but  cardiac workup was (-).    Pertinent History AVR in 2020, L UE trauma with loss of limb              [facility] PT Assessment - 05/07/20 0001      Special Tests   Other special tests Hoffmans and inverted supinator sign (-), right bicep/tricep and brachioradialis reflex 2+                         [facility] Adult PT Treatment/Exercise - 05/07/20 0001      Exercises   Exercises Neck      Neck Exercises: Seated   Neck Retraction Limitations brief practice seated retractions for HEP      Neck Exercises: Supine   Neck Retraction 15 reps    Cervical Rotation Limitations attempted right rotation in supine but held due to onset right UE radiating symptoms      Manual Therapy   Manual Therapy Joint mobilization;Manual Traction    Joint Mobilization Thoracic PAs grade I-IV    Manual Traction cervical manual traction and suboccipital release      Neck Exercises: Stretches   Upper Trapezius Stretch Left;3 reps;30 seconds    Levator Stretch Left;3 reps;30 seconds            Trigger Point Dry Needling - 05/07/20 0001    Consent Given? Yes    Education Handout Provided Yes    Muscles Treated Head and Neck Upper trapezius    Muscles Treated Upper Quadrant Infraspinatus   needled due to radiating pain into shoulder region noted with trigger point palpation   Dry Needling Comments needling in prone with 30 gauge 30 mm needles for upper trapezius and 32 gauge 50 mm needle for infraspinatus                PT Education - 05/07/20 1429    Education Details dry needling, HEP, potential symptom etiology    Person(s) Educated Patient    Methods Explanation;Handout    Comprehension Verbalized understanding            PT Short Term Goals - 04/17/20 1237      PT SHORT TERM GOAL #1   Title Pt will be I with HEP for cervical spine, posture    Time 4    Period Weeks    Status New    Target Date 05/15/20      PT SHORT TERM GOAL #2   Title Pt will notice increased  cervical rotation for driving, scanning the environment, less restriction.    Time 4    Period Weeks    Status New    Target Date 05/15/20      PT SHORT TERM GOAL #3   Title Pt will be able to report greater postural awareness with activities at work and recreation.    Time 4    Period Weeks    Status New    Target Date 05/15/20  PT Long Term Goals - 04/17/20 1240      PT LONG TERM GOAL #1   Title FOTO score will improve to 58% ability or more as of last visit    Baseline 51%    Time 8    Period Weeks    Status New    Target Date 06/12/20      PT LONG TERM GOAL #2   Title Pt will be able to report walking with min pain in the neck , 50% of the time, improved from moderate-severe.    Time 8    Period Weeks    Status New    Target Date 06/12/20      PT LONG TERM GOAL #3   Title Pt will be able to report consistent HEP to strengthen upper body and optimize posture    Time 8    Period Weeks    Status New    Target Date 06/12/20      PT LONG TERM GOAL #4   Title Pt will demonstrate pain free AROM of cervical spine in all planes , WFL for IADLs    Time 8    Period Weeks    Status New    Target Date 06/12/20                 Plan - 05/07/20 1429    Clinical Impression Statement Performed trial dry needling to left upper trapezius region trigger point and also included left infrspinatus due to trigger point noted with associated radiating pain into left shoulder region on palpation. Attempted directional preference exercises with good tolerance retractions but pt. noted radiating symptoms into right arm with right cervical rotation. Given bilat. UE symptoms checked Hoffman's and Inverted supinator sign as well as reflexes to screen for myelopathy (clinical tests all negative) and no other myelopathy symptoms (gait difficulties or dysphagia/dysarthria) noted. Trial manual traction today as well which was well-tolerated, differential diagnosis could include  radiculopathy so plan continue PT including traction and exercises as tolerated with dry needling to help address radicular symptoms and associated myofascial pain.    Personal Factors and Comorbidities Comorbidity 2;Time since onset of injury/illness/exacerbation    Comorbidities cardiac, L UE prosthesis    Examination-Activity Limitations Sleep;Bed Mobility;Caring for Others;Carry;Other;Locomotion Level;Reach Overhead    Examination-Participation Restrictions Interpersonal Relationship;Occupation;Community Activity;Yard Work    Stability/Clinical Decision Making Stable/Uncomplicated    Clinical Decision Making Low    Rehab Potential Good    PT Frequency 1x / week    PT Duration 8 weeks    PT Treatment/Interventions ADLs/Self Care Home Management;Cryotherapy;Patient/family education;Therapeutic exercise;Manual techniques;Moist Heat;Electrical Stimulation;Functional mobility training;Therapeutic activities;Taping;Neuromuscular re-education;Dry needling;Passive range of motion    PT Next Visit Plan check response dry needling, continue traction manual vs. mechanical, check response retractions for any symptom centralization (also discussed potential repeated flexion if no improvement with retractions, work on stretches and postural strengthening    PT Home Exercise Plan [email or web address]    Consulted and Agree with Plan of Care Patient           Patient will benefit from skilled therapeutic intervention in order to improve the following deficits and impairments:  Decreased mobility,Decreased activity tolerance,Impaired flexibility,Impaired UE functional use,Postural dysfunction,Pain,Decreased strength,Increased fascial restricitons,Decreased range of motion,Prosthetic Dependency  Visit Diagnosis: Abnormal posture  Cervicalgia  Cramp and spasm     Problem List Patient Active Problem List   Diagnosis Date Noted  . Left cervical radiculopathy 05/04/2020  . Midline low back pain 05/04/2020  .  Bilateral leg paresthesia 05/04/2020  . Trigger point of left shoulder region 03/26/2020  . Degenerative cervical disc 03/26/2020  . Chest pain at rest 08/09/2019  . Epigastric pain 08/09/2019  . S/P aortic valve repair 09/15/2018  . SOB (shortness of breath) 09/12/2018  . Fatigue due to excessive exertion 09/12/2018  . Dizziness   . Heart murmur   . Severe aortic insufficiency   . Chest pain 06/06/2018  . Neck pain 05/31/2018  . Pre-diabetes 05/10/2018  . Deviated septum 10/31/2017  . Conductive hearing loss of right ear with unrestricted hearing of left ear 10/31/2017  . Impacted cerumen 10/31/2017  . Mastoiditis, chronic, right 10/31/2017  . Nasal turbinate hypertrophy 10/31/2017  . Fatty liver 07/20/2017  . Abdominal bloating 07/20/2017  . GERD with esophagitis 06/08/2016  . Essential hypertension 06/08/2016  . 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 Anxiety state 11/15/2014  . CAD (coronary artery disease) 10/30/2014  . Tobacco abuse 10/30/2014  . HLD (hyperlipidemia) 10/30/2014  . History of non-ST elevation myocardial infarction (NSTEMI) 10/28/2014  . Preventative health care 05/29/2014  . Obstructive sleep apnea 05/08/2014  . Cholesteatoma of ear 05/08/2014    Beaulah Dinning, PT, DPT 05/07/20 2:37 PM  [facility] [address] [place], [place], [address] Phone: [phone]   Fax:  [phone]  Name: Isaac Patel MRN: [id number] Date of Birth: Aug 25, 1965

## 2020-05-07 NOTE — Patient Instructions (Signed)

## 2020-05-14 ENCOUNTER — Other Ambulatory Visit: Payer: Self-pay

## 2020-05-14 ENCOUNTER — Ambulatory Visit: Payer: 59 | Admitting: Physical Therapy

## 2020-05-14 DIAGNOSIS — R293 Abnormal posture: Secondary | ICD-10-CM

## 2020-05-14 DIAGNOSIS — M542 Cervicalgia: Secondary | ICD-10-CM | POA: Diagnosis not present

## 2020-05-14 DIAGNOSIS — R252 Cramp and spasm: Secondary | ICD-10-CM | POA: Diagnosis not present

## 2020-05-14 NOTE — Therapy (Signed)
[place], [place], [address] Phone: [phone]   Fax:  [phone]  Physical Therapy Treatment  Patient Details  Name: Isaac Patel MRN: [id number] Date of Birth: 1965-09-09 Referring Provider (PT): Dr. Hulan Saas   Encounter Date: 05/14/2020   PT End of Session - 05/14/20 0828    Visit Number 3    Number of Visits 8    Date for PT Re-Evaluation 06/13/20    Authorization Type UMR    PT Start Time 0800    PT Stop Time 0842    PT Time Calculation (min) 42 min    Activity Tolerance Patient tolerated treatment well    Behavior During Therapy [facility] for tasks assessed/performed           Past Medical History:  Diagnosis Date  . Aortic insufficiency    Status post aortic valve repair // Echo 8/20: EF 50-55, moderate LVH, grade 1 diastolic dysfunction, normal RV SF, status post AV repair, mild AI, mild dilation of aortic root (38 mm)  . CAD (coronary artery disease)    a.  Non-STEMI 8/16: LHC-proximal LAD 80% treated with a resolute DES, EF 50-55%;  b.  Echo 8/16:  Moderate LVH, EF 55-60%, normal wall motion, normal diastolic function, normal RV function  . CHF (congestive heart failure) ([place])   . Dyspnea    mild  . GERD (gastroesophageal reflux disease)   . Headache    "maybe monthly" (10/28/2014)  . HLD (hyperlipidemia)   . Hypertension   . NSTEMI (non-ST elevated myocardial infarction) ([place]) 10/28/2014  . OSA (obstructive sleep apnea)    "tried mask; wear it off and on" (10/28/2014)  . S/P aortic valve repair 09/15/2018   Complex valvuloplasty including plication of prolapsing right coronary cusp with Biostable HAART 300 aortic annuloplasty, size 21 mm    Past Surgical History:  Procedure Laterality Date  . AORTIC VALVE REPAIR N/A 09/15/2018   Procedure: AORTIC VALVE REPAIR USING HAART 300 AORTIC ANNULOPLASTY DEVICE SIZE 21MM;  Surgeon: Rexene Alberts, MD;  Location: [place];  Service: Open Heart  Surgery;  Laterality: N/A;  . 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 CARDIAC CATHETERIZATION N/A 10/29/2014   Procedure: Left Heart Cath and Coronary Angiography;  Surgeon: Troy Sine, MD;  Location: [place] CV LAB;  Service: Cardiovascular;  Laterality: N/A;  . CHOLESTEATOMA EXCISION Right 1990's  . LEFT HEART CATH AND CORONARY ANGIOGRAPHY N/A 05/25/2016   Procedure: Left Heart Cath and Coronary Angiography;  Surgeon: Sherren Mocha, MD;  Location: [place] CV LAB;  Service: Cardiovascular;  Laterality: N/A;  . MULTIPLE TOOTH EXTRACTIONS  08/29/2018  . NASAL SEPTOPLASTY W/ TURBINOPLASTY Bilateral 11/30/2017   Procedure: NASAL SEPTOPLASTY WITH TURBINATE REDUCTION;  Surgeon: Jerrell Belfast, MD;  Location: [place];  Service: ENT;  Laterality: Bilateral;  . RIGHT/LEFT HEART CATH AND CORONARY ANGIOGRAPHY N/A 08/11/2018   Procedure: RIGHT/LEFT HEART CATH AND CORONARY ANGIOGRAPHY;  Surgeon: Burnell Blanks, MD;  Location: [place] CV LAB;  Service: Cardiovascular;  Laterality: N/A;  . TEE WITHOUT CARDIOVERSION N/A 06/08/2018   Procedure: TRANSESOPHAGEAL ECHOCARDIOGRAM (TEE);  Surgeon: Buford Dresser, MD;  Location[facility];  Service: Cardiovascular;  Laterality: N/A;  . TEE WITHOUT CARDIOVERSION N/A 09/15/2018   Procedure: TRANSESOPHAGEAL ECHOCARDIOGRAM (TEE);  Surgeon: Rexene Alberts, MD;  Location: [place];  Service: Open Heart Surgery;  Laterality: N/A;  . TYMPANOSTOMY TUBE PLACEMENT Bilateral "as a kid"   "had one  done as an adult too"  . WISDOM TOOTH EXTRACTION      There were no vitals filed for this visit.   Subjective Assessment - 05/14/20 0820    Subjective Patient continues to have pain biut he feels like the nbeedling helped. His pain is still radiating down his armsbut not as far    Pertinent History AVR in 2020, L UE trauma with loss of limb    Limitations Sitting;Lifting;House hold activities;Other (comment)    Diagnostic tests  2019 XR showed diffiuse degenerative disease and spondylosis in cervical spine with foraminal narrowing    Patient Stated Goals Patient would like to work on HEP and prevent progression of issues, avoid surgery    Currently in Pain? Yes    Pain Score 6     Pain Location Thoracic    Pain Orientation Right    Pain Descriptors / Indicators Aching    Pain Type Chronic pain    Pain Onset More than a month ago    Pain Frequency Constant    Aggravating Factors  in the morning    Pain Relieving Factors improves with time    Effect of Pain on Daily Activities pain in the morning                             [facility] Adult PT Treatment/Exercise - 05/14/20 0001      Shoulder Exercises: Standing   Horizontal ABduction Weight (lbs) reverse fly x 10 on wall    External Rotation Weight (lbs) combined with abduction x 10 on wall      Manual Therapy   Manual Therapy Joint mobilization;Manual Traction    Manual therapy comments skilled palpation of trigger points    Joint Mobilization Thoracic PAs grade I-IV    Manual Traction cervical manual traction and suboccipital release      Neck Exercises: Stretches   Upper Trapezius Stretch Left;3 reps;30 seconds    Levator Stretch Left;3 reps;30 seconds            Trigger Point Dry Needling - 05/14/20 0001    Consent Given? Yes    Education Handout Provided Yes    Muscles Treated Head and Neck Upper trapezius;Levator scapulae    Dry Needling Comments 2 spots in left upper trap and 2 sapots in left levator 2x30 sec    Upper Trapezius Response Twitch reponse elicited;Palpable increased muscle length    Levator Scapulae Response Twitch response elicited;Palpable increased muscle length                PT Education - 05/14/20 0827    Education Details risks and benefits of TPDN    Person(s) Educated Patient    Methods Explanation;Demonstration;Tactile cues;Verbal cues    Comprehension Verbalized understanding;Returned  demonstration;Verbal cues required;Tactile cues required            PT Short Term Goals - 04/17/20 1237      PT SHORT TERM GOAL #1   Title Pt will be I with HEP for cervical spine, posture    Time 4    Period Weeks    Status New    Target Date 05/15/20      PT SHORT TERM GOAL #2   Title Pt will notice increased cervical rotation for driving, scanning the environment, less restriction.    Time 4    Period Weeks    Status New    Target Date 05/15/20  PT SHORT TERM GOAL #3   Title Pt will be able to report greater postural awareness with activities at work and recreation.    Time 4    Period Weeks    Status New    Target Date 05/15/20             PT Long Term Goals - 04/17/20 1240      PT LONG TERM GOAL #1   Title FOTO score will improve to 58% ability or more as of last visit    Baseline 51%    Time 8    Period Weeks    Status New    Target Date 06/12/20      PT LONG TERM GOAL #2   Title Pt will be able to report walking with min pain in the neck , 50% of the time, improved from moderate-severe.    Time 8    Period Weeks    Status New    Target Date 06/12/20      PT LONG TERM GOAL #3   Title Pt will be able to report consistent HEP to strengthen upper body and optimize posture    Time 8    Period Weeks    Status New    Target Date 06/12/20      PT LONG TERM GOAL #4   Title Pt will demonstrate pain free AROM of cervical spine in all planes , WFL for IADLs    Time 8    Period Weeks    Status New    Target Date 06/12/20                 Plan - 05/14/20 0814    Clinical Impression Statement Patient had a great twitch respose in his upper trap and levator. His motion appears to be inmproving but is sitll limtied to the left. The pain is coming out of his arms a bit. he has an MRI schedueld for 2 weeks from now. We will continue to work on posterior strengthenign and manual therapy until that time. Most of his pain was in his thoracic spoine.  Therapy perfromed PA's to his thoriacic spine.    Personal Factors and Comorbidities Comorbidity 2;Time since onset of injury/illness/exacerbation    Comorbidities cardiac, L UE prosthesis    Examination-Activity Limitations Sleep;Bed Mobility;Caring for Others;Carry;Other;Locomotion Level;Reach Overhead    Examination-Participation Restrictions Interpersonal Relationship;Occupation;Community Activity;Yard Work    Stability/Clinical Decision Making Stable/Uncomplicated    Clinical Decision Making Low    Rehab Potential Good    PT Frequency 1x / week    PT Duration 8 weeks    PT Treatment/Interventions ADLs/Self Care Home Management;Cryotherapy;Patient/family education;Therapeutic exercise;Manual techniques;Moist Heat;Electrical Stimulation;Functional mobility training;Therapeutic activities;Taping;Neuromuscular re-education;Dry needling;Passive range of motion           Patient will benefit from skilled therapeutic intervention in order to improve the following deficits and impairments:  Decreased mobility,Decreased activity tolerance,Impaired flexibility,Impaired UE functional use,Postural dysfunction,Pain,Decreased strength,Increased fascial restricitons,Decreased range of motion,Prosthetic Dependency  Visit Diagnosis: Abnormal posture  Cervicalgia  Cramp and spasm     Problem List Patient Active Problem List   Diagnosis Date Noted  . Left cervical radiculopathy 05/04/2020  . Midline low back pain 05/04/2020  . Bilateral leg paresthesia 05/04/2020  . Trigger point of left shoulder region 03/26/2020  . Degenerative cervical disc 03/26/2020  . Chest pain at rest 08/09/2019  . Epigastric pain 08/09/2019  . S/P aortic valve repair 09/15/2018  . SOB (shortness of breath) 09/12/2018  .  Fatigue due to excessive exertion 09/12/2018  . Dizziness   . Heart murmur   . Severe aortic insufficiency   . Chest pain 06/06/2018  . Neck pain 05/31/2018  . Pre-diabetes 05/10/2018  .  Deviated septum 10/31/2017  . Conductive hearing loss of right ear with unrestricted hearing of left ear 10/31/2017  . Impacted cerumen 10/31/2017  . Mastoiditis, chronic, right 10/31/2017  . Nasal turbinate hypertrophy 10/31/2017  . Fatty liver 07/20/2017  . Abdominal bloating 07/20/2017  . GERD with esophagitis 06/08/2016  . Essential hypertension 06/08/2016  . 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 Anxiety state 11/15/2014  . CAD (coronary artery disease) 10/30/2014  . Tobacco abuse 10/30/2014  . HLD (hyperlipidemia) 10/30/2014  . History of non-ST elevation myocardial infarction (NSTEMI) 10/28/2014  . Preventative health care 05/29/2014  . Obstructive sleep apnea 05/08/2014  . Cholesteatoma of ear 05/08/2014    Carney Living PT DPT  05/14/2020, 1:35 PM  [facility] [address] [place], [place], [address] Phone: [phone]   Fax:  [phone]  Name: Kaeson Kleinert MRN: [id number] Date of Birth: 01/28/1966

## 2020-05-16 ENCOUNTER — Encounter: Payer: Self-pay | Admitting: Neurology

## 2020-05-21 ENCOUNTER — Encounter: Payer: Self-pay | Admitting: Physical Therapy

## 2020-05-21 ENCOUNTER — Other Ambulatory Visit: Payer: Self-pay

## 2020-05-21 ENCOUNTER — Ambulatory Visit: Payer: 59 | Admitting: Physical Therapy

## 2020-05-21 DIAGNOSIS — R252 Cramp and spasm: Secondary | ICD-10-CM

## 2020-05-21 DIAGNOSIS — M542 Cervicalgia: Secondary | ICD-10-CM | POA: Diagnosis not present

## 2020-05-21 DIAGNOSIS — R293 Abnormal posture: Secondary | ICD-10-CM | POA: Diagnosis not present

## 2020-05-21 NOTE — Therapy (Signed)
[place], [place], [address] Phone: [phone]   Fax:  [phone]  Physical Therapy Treatment  Patient Details  Name: Isaac Patel MRN: [id number] Date of Birth: 12/31/1965 Referring Provider (PT): Dr. Hulan Saas   Encounter Date: 05/21/2020   PT End of Session - 05/21/20 0855    Visit Number 4    Number of Visits 8    Date for PT Re-Evaluation 06/13/20    Authorization Type UMR    PT Start Time 0800    PT Stop Time 0846    PT Time Calculation (min) 46 min    Activity Tolerance Patient tolerated treatment well    Behavior During Therapy [facility] for tasks assessed/performed           Past Medical History:  Diagnosis Date  . Aortic insufficiency    Status post aortic valve repair // Echo 8/20: EF 50-55, moderate LVH, grade 1 diastolic dysfunction, normal RV SF, status post AV repair, mild AI, mild dilation of aortic root (38 mm)  . CAD (coronary artery disease)    a.  Non-STEMI 8/16: LHC-proximal LAD 80% treated with a resolute DES, EF 50-55%;  b.  Echo 8/16:  Moderate LVH, EF 55-60%, normal wall motion, normal diastolic function, normal RV function  . CHF (congestive heart failure) ([place])   . Dyspnea    mild  . GERD (gastroesophageal reflux disease)   . Headache    "maybe monthly" (10/28/2014)  . HLD (hyperlipidemia)   . Hypertension   . NSTEMI (non-ST elevated myocardial infarction) ([place]) 10/28/2014  . OSA (obstructive sleep apnea)    "tried mask; wear it off and on" (10/28/2014)  . S/P aortic valve repair 09/15/2018   Complex valvuloplasty including plication of prolapsing right coronary cusp with Biostable HAART 300 aortic annuloplasty, size 21 mm    Past Surgical History:  Procedure Laterality Date  . AORTIC VALVE REPAIR N/A 09/15/2018   Procedure: AORTIC VALVE REPAIR USING HAART 300 AORTIC ANNULOPLASTY DEVICE SIZE 21MM;  Surgeon: Rexene Alberts, MD;  Location: [place];  Service: Open Heart  Surgery;  Laterality: N/A;  . 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 CARDIAC CATHETERIZATION N/A 10/29/2014   Procedure: Left Heart Cath and Coronary Angiography;  Surgeon: Troy Sine, MD;  Location: [place] [facility];  Service: Cardiovascular;  Laterality: N/A;  . CHOLESTEATOMA EXCISION Right 1990's  . LEFT HEART CATH AND CORONARY ANGIOGRAPHY N/A 05/25/2016   Procedure: Left Heart Cath and Coronary Angiography;  Surgeon: Sherren Mocha, MD;  Location: [place] [facility];  Service: Cardiovascular;  Laterality: N/A;  . MULTIPLE TOOTH EXTRACTIONS  08/29/2018  . NASAL SEPTOPLASTY W/ TURBINOPLASTY Bilateral 11/30/2017   Procedure: NASAL SEPTOPLASTY WITH TURBINATE REDUCTION;  Surgeon: Jerrell Belfast, MD;  Location: [facility];  Service: ENT;  Laterality: Bilateral;  . RIGHT/LEFT HEART CATH AND CORONARY ANGIOGRAPHY N/A 08/11/2018   Procedure: RIGHT/LEFT HEART CATH AND CORONARY ANGIOGRAPHY;  Surgeon: Burnell Blanks, MD;  Location: [place] [facility];  Service: Cardiovascular;  Laterality: N/A;  . TEE WITHOUT CARDIOVERSION N/A 06/08/2018   Procedure: TRANSESOPHAGEAL ECHOCARDIOGRAM (TEE);  Surgeon: Buford Dresser, MD;  Location: [facility] ENDOSCOPY;  Service: Cardiovascular;  Laterality: N/A;  . TEE WITHOUT CARDIOVERSION N/A 09/15/2018   Procedure: TRANSESOPHAGEAL ECHOCARDIOGRAM (TEE);  Surgeon: Rexene Alberts, MD;  Location: [place];  Service: Open Heart Surgery;  Laterality: N/A;  . TYMPANOSTOMY TUBE PLACEMENT Bilateral "as a kid"   "had one  done as an adult too"  . WISDOM TOOTH EXTRACTION      There were no vitals filed for this visit.   Subjective Assessment - 05/21/20 0850    Subjective Neck stiff this AM with pain 4/10. Still with radiating symptoms into left upper trapezius and superior posterior scapular region. Pt. is scheduled for MRI next week. Discussed status with pt. and plan will be to hold further PT for now until MRI and subsequent MD follow up to  determine further POC pending results.    Pertinent History AVR in 2020, L UE trauma with loss of limb    Currently in Pain? Yes    Pain Score 4     Pain Location Neck    Pain Type Chronic pain    Pain Radiating Towards left shoulder    Pain Onset More than a month ago    Pain Frequency Constant    Aggravating Factors  worse in AM                             [facility] PT Treatment/Exercise - 05/21/20 0001      Neck Exercises: Seated   Neck Retraction Limitations brief review/practice form with cervical retractions      Neck Exercises: Supine   Neck Retraction 20 reps    Neck Retraction Limitations with therapist assist/gentle OP      Manual Therapy   Joint Mobilization thoracic PAs grade I-III, cervical lateral glides R>L fore left "opening" in supine grade II-III, prone UPAs to right mid to lower cervical region grade II-III    Manual Traction cervical manual traction and suboccipital release   straight plane and including angles with slight right cervical sidebending and rotation     Neck Exercises: Stretches   Upper Trapezius Stretch Left;3 reps;30 seconds    Levator Stretch Left;3 reps;30 seconds    Other Neck Stretches supine manual pec minor stretch 3x20 sec            Trigger Point Dry Needling - 05/21/20 0001    Consent Given? Yes    Education Handout Provided Previously provided    Muscles Treated Head and Neck Upper trapezius;Levator scapulae;Cervical multifidi    Muscles Treated Upper Quadrant Infraspinatus    Dry Needling Comments needling in prone to left side muscles as noted with 32 gauge 30 mm needles for upper trapezius and levator and 50 mm needles for infraspinatus and cervical multifidi   needling x 7 min total   Upper Trapezius Response Twitch reponse elicited;Palpable increased muscle length    Levator Scapulae Response Twitch response elicited;Palpable increased muscle length                PT Education - 05/21/20 0855     Education Details POC    Person(s) Educated Patient    Methods Explanation    Comprehension Verbalized understanding            PT Short Term Goals - 05/21/20 0859      PT SHORT TERM GOAL #1   Title Pt will be I with HEP for cervical spine, posture    Baseline met for initial HEP    Time 4    Period Weeks    Status Achieved      PT SHORT TERM GOAL #2   Title Pt will notice increased cervical rotation for driving, scanning the environment, less restriction.    Baseline still stiff in AM  Time 4    Period Weeks    Status On-going      PT SHORT TERM GOAL #3   Title Pt will be able to report greater postural awareness with activities at work and recreation.    Time 4    Period Weeks    Status On-going             PT Long Term Goals - 05/21/20 0859      PT LONG TERM GOAL #1   Title FOTO score will improve to 58% ability or more as of last visit    Baseline 51%    Time 8    Period Weeks    Status On-going      PT LONG TERM GOAL #2   Title Pt will be able to report walking with min pain in the neck , 50% of the time, improved from moderate-severe.    Baseline ongoing    Time 8    Period Weeks    Status On-going      PT LONG TERM GOAL #3   Title Pt will be able to report consistent HEP to strengthen upper body and optimize posture    Time 8    Period Weeks    Status On-going      PT LONG TERM GOAL #4   Title Pt will demonstrate pain free AROM of cervical spine in all planes , WFL for IADLs    Baseline still ongoing    Time 8    Period Weeks    Status On-going                 Plan - 05/21/20 7628    Clinical Impression Statement Continued previous tx. focus with cervical traction/manual therapy and dry needling. Fair status given continued radicular symptoms-as noted in subjective plan hold off on PT until MRI obtained and pt. can follow up with MD re: results with decision to d/c therapy vs. return pending status.    Personal Factors and  Comorbidities Comorbidity 2;Time since onset of injury/illness/exacerbation    Comorbidities cardiac, L UE prosthesis    Examination-Activity Limitations Sleep;Bed Mobility;Caring for Others;Carry;Other;Locomotion Level;Reach Overhead    Examination-Participation Restrictions Interpersonal Relationship;Occupation;Community Activity;Yard Work    Stability/Clinical Decision Making Stable/Uncomplicated    Clinical Decision Making Low    Rehab Potential Good    PT Frequency 1x / week    PT Duration 8 weeks    PT Treatment/Interventions ADLs/Self Care Home Management;Cryotherapy;Patient/family education;Therapeutic exercise;Manual techniques;Moist Heat;Electrical Stimulation;Functional mobility training;Therapeutic activities;Taping;Neuromuscular re-education;Dry needling;Passive range of motion    PT Next Visit Plan await status after MRI and MD follow up re: further plan, as needed continue traction/manual, dry needling stretches and exercises-check for any centralization response from retractions with home exercises    PT Home Exercise Plan [email or web address]    Consulted and Agree with Plan of Care Patient           Patient will benefit from skilled therapeutic intervention in order to improve the following deficits and impairments:  Decreased mobility,Decreased activity tolerance,Impaired flexibility,Impaired UE functional use,Postural dysfunction,Pain,Decreased strength,Increased fascial restricitons,Decreased range of motion,Prosthetic Dependency  Visit Diagnosis: Cervicalgia  Abnormal posture  Cramp and spasm     Problem List Patient Active Problem List   Diagnosis Date Noted  . Left cervical radiculopathy 05/04/2020  . Midline low back pain 05/04/2020  . Bilateral leg paresthesia 05/04/2020  . Trigger point of left shoulder region 03/26/2020  . Degenerative cervical disc 03/26/2020  . Chest pain  at rest 08/09/2019  . Epigastric pain 08/09/2019  . S/P aortic valve repair 09/15/2018   . SOB (shortness of breath) 09/12/2018  . Fatigue due to excessive exertion 09/12/2018  . Dizziness   . Heart murmur   . Severe aortic insufficiency   . Chest pain 06/06/2018  . Neck pain 05/31/2018  . Pre-diabetes 05/10/2018  . Deviated septum 10/31/2017  . Conductive hearing loss of right ear with unrestricted hearing of left ear 10/31/2017  . Impacted cerumen 10/31/2017  . Mastoiditis, chronic, right 10/31/2017  . Nasal turbinate hypertrophy 10/31/2017  . Fatty liver 07/20/2017  . Abdominal bloating 07/20/2017  . GERD with esophagitis 06/08/2016  . Essential hypertension 06/08/2016  . 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 Anxiety state 11/15/2014  . CAD (coronary artery disease) 10/30/2014  . Tobacco abuse 10/30/2014  . HLD (hyperlipidemia) 10/30/2014  . History of non-ST elevation myocardial infarction (NSTEMI) 10/28/2014  . Preventative health care 05/29/2014  . Obstructive sleep apnea 05/08/2014  . Cholesteatoma of ear 05/08/2014    Beaulah Dinning, PT, DPT 05/21/20 9:01 AM  [facility] [address] [place], [place], [address] Phone: [phone]   Fax:  [phone]  Name: Isaac Patel MRN: [id number] Date of Birth: 04-07-1965

## 2020-05-26 ENCOUNTER — Ambulatory Visit
Admission: RE | Admit: 2020-05-26 | Discharge: 2020-05-26 | Disposition: A | Discharge status: Home / Self Care | Payer: 59 | Source: Ambulatory Visit | Attending: Internal Medicine | Admitting: Internal Medicine

## 2020-05-26 ENCOUNTER — Other Ambulatory Visit: Payer: Self-pay

## 2020-05-26 ENCOUNTER — Encounter: Payer: Self-pay | Admitting: Internal Medicine

## 2020-05-26 DIAGNOSIS — M50321 Other cervical disc degeneration at C4-C5 level: Secondary | ICD-10-CM | POA: Diagnosis not present

## 2020-05-26 DIAGNOSIS — M50322 Other cervical disc degeneration at C5-C6 level: Secondary | ICD-10-CM | POA: Diagnosis not present

## 2020-05-26 DIAGNOSIS — M47812 Spondylosis without myelopathy or radiculopathy, cervical region: Secondary | ICD-10-CM | POA: Diagnosis not present

## 2020-05-26 DIAGNOSIS — M5412 Radiculopathy, cervical region: Secondary | ICD-10-CM

## 2020-05-26 DIAGNOSIS — M4802 Spinal stenosis, cervical region: Secondary | ICD-10-CM | POA: Diagnosis not present

## 2020-06-17 NOTE — Therapy (Signed)
[place], [place], [address] Phone: [phone]   Fax:  [phone]  Physical Therapy Treatment/Discharge  Patient Details  Name: Isaac Patel MRN: [id number] Date of Birth: 03-07-1966 Referring Provider (PT): Dr. Hulan Saas   Encounter Date: 05/21/2020    Past Medical History:  Diagnosis Date  . Aortic insufficiency    Status post aortic valve repair // Echo 8/20: EF 50-55, moderate LVH, grade 1 diastolic dysfunction, normal RV SF, status post AV repair, mild AI, mild dilation of aortic root (38 mm)  . CAD (coronary artery disease)    a.  Non-STEMI 8/16: LHC-proximal LAD 80% treated with a resolute DES, EF 50-55%;  b.  Echo 8/16:  Moderate LVH, EF 55-60%, normal wall motion, normal diastolic function, normal RV function  . CHF (congestive heart failure) ([place])   . Dyspnea    mild  . GERD (gastroesophageal reflux disease)   . Headache    "maybe monthly" (10/28/2014)  . HLD (hyperlipidemia)   . Hypertension   . NSTEMI (non-ST elevated myocardial infarction) ([place]) 10/28/2014  . OSA (obstructive sleep apnea)    "tried mask; wear it off and on" (10/28/2014)  . S/P aortic valve repair 09/15/2018   Complex valvuloplasty including plication of prolapsing right coronary cusp with Biostable HAART 300 aortic annuloplasty, size 21 mm    Past Surgical History:  Procedure Laterality Date  . AORTIC VALVE REPAIR N/A 09/15/2018   Procedure: AORTIC VALVE REPAIR USING HAART 300 AORTIC ANNULOPLASTY DEVICE SIZE 21MM;  Surgeon: Rexene Alberts, MD;  Location: [facility];  Service: Open Heart Surgery;  Laterality: N/A;  . 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 CARDIAC CATHETERIZATION N/A 10/29/2014   Procedure: Left Heart Cath and Coronary Angiography;  Surgeon: Troy Sine, MD;  Location: [place] [facility];  Service: Cardiovascular;  Laterality: N/A;  . CHOLESTEATOMA EXCISION Right 1990's  . LEFT  HEART CATH AND CORONARY ANGIOGRAPHY N/A 05/25/2016   Procedure: Left Heart Cath and Coronary Angiography;  Surgeon: Sherren Mocha, MD;  Location: [place] [facility];  Service: Cardiovascular;  Laterality: N/A;  . MULTIPLE TOOTH EXTRACTIONS  08/29/2018  . NASAL SEPTOPLASTY W/ TURBINOPLASTY Bilateral 11/30/2017   Procedure: NASAL SEPTOPLASTY WITH TURBINATE REDUCTION;  Surgeon: Jerrell Belfast, MD;  Location: [place];  Service: ENT;  Laterality: Bilateral;  . RIGHT/LEFT HEART CATH AND CORONARY ANGIOGRAPHY N/A 08/11/2018   Procedure: RIGHT/LEFT HEART CATH AND CORONARY ANGIOGRAPHY;  Surgeon: Burnell Blanks, MD;  Location: [place] [facility];  Service: Cardiovascular;  Laterality: N/A;  . TEE WITHOUT CARDIOVERSION N/A 06/08/2018   Procedure: TRANSESOPHAGEAL ECHOCARDIOGRAM (TEE);  Surgeon: Buford Dresser, MD;  Location: [facility] ENDOSCOPY;  Service: Cardiovascular;  Laterality: N/A;  . TEE WITHOUT CARDIOVERSION N/A 09/15/2018   Procedure: TRANSESOPHAGEAL ECHOCARDIOGRAM (TEE);  Surgeon: Rexene Alberts, MD;  Location: [place];  Service: Open Heart Surgery;  Laterality: N/A;  . TYMPANOSTOMY TUBE PLACEMENT Bilateral "as a kid"   "had one done as an adult too"  . WISDOM TOOTH EXTRACTION      There were no vitals filed for this visit.                                PT Short Term Goals - 05/21/20 0859      PT SHORT TERM GOAL #1   Title Pt will be I with HEP for cervical spine, posture  Baseline met for initial HEP    Time 4    Period Weeks    Status Achieved      PT SHORT TERM GOAL #2   Title Pt will notice increased cervical rotation for driving, scanning the environment, less restriction.    Baseline still stiff in AM    Time 4    Period Weeks    Status On-going      PT SHORT TERM GOAL #3   Title Pt will be able to report greater postural awareness with activities at work and recreation.    Time 4    Period Weeks    Status On-going             PT  Long Term Goals - 05/21/20 0859      PT LONG TERM GOAL #1   Title FOTO score will improve to 58% ability or more as of last visit    Baseline 51%    Time 8    Period Weeks    Status On-going      PT LONG TERM GOAL #2   Title Pt will be able to report walking with min pain in the neck , 50% of the time, improved from moderate-severe.    Baseline ongoing    Time 8    Period Weeks    Status On-going      PT LONG TERM GOAL #3   Title Pt will be able to report consistent HEP to strengthen upper body and optimize posture    Time 8    Period Weeks    Status On-going      PT LONG TERM GOAL #4   Title Pt will demonstrate pain free AROM of cervical spine in all planes , WFL for IADLs    Baseline still ongoing    Time 8    Period Weeks    Status On-going                  Patient will benefit from skilled therapeutic intervention in order to improve the following deficits and impairments:  Decreased mobility,Decreased activity tolerance,Impaired flexibility,Impaired UE functional use,Postural dysfunction,Pain,Decreased strength,Increased fascial restricitons,Decreased range of motion,Prosthetic Dependency  Visit Diagnosis: Cervicalgia  Abnormal posture  Cramp and spasm     Problem List Patient Active Problem List   Diagnosis Date Noted  . Left cervical radiculopathy 05/04/2020  . Midline low back pain 05/04/2020  . Bilateral leg paresthesia 05/04/2020  . Trigger point of left shoulder region 03/26/2020  . Degenerative cervical disc 03/26/2020  . Chest pain at rest 08/09/2019  . Epigastric pain 08/09/2019  . S/P aortic valve repair 09/15/2018  . SOB (shortness of breath) 09/12/2018  . Fatigue due to excessive exertion 09/12/2018  . Dizziness   . Heart murmur   . Severe aortic insufficiency   . Chest pain 06/06/2018  . Neck pain 05/31/2018  . Pre-diabetes 05/10/2018  . Deviated septum 10/31/2017  . Conductive hearing loss of right ear with unrestricted  hearing of left ear 10/31/2017  . Impacted cerumen 10/31/2017  . Mastoiditis, chronic, right 10/31/2017  . Nasal turbinate hypertrophy 10/31/2017  . Fatty liver 07/20/2017  . Abdominal bloating 07/20/2017  . GERD with esophagitis 06/08/2016  . Essential hypertension 06/08/2016  . 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 Anxiety state 11/15/2014  . CAD (coronary artery disease) 10/30/2014  . Tobacco abuse 10/30/2014  . HLD (hyperlipidemia) 10/30/2014  . History of non-ST elevation myocardial infarction (NSTEMI) 10/28/2014  .  Preventative health care 05/29/2014  . Obstructive sleep apnea 05/08/2014  . Cholesteatoma of ear 05/08/2014      PHYSICAL THERAPY DISCHARGE SUMMARY  Visits from Start of Care: 4  Current functional level related to goals / functional outcomes: Patient did not return for further therapy after last session 05/21/20-no further PT visits scheduled at this time-patient was following up with MD regarding MRI results and further status for treatment options   Remaining deficits: Continued cervical radiculopathy symptoms as of last attended visit   Education / Equipment: HEP Plan: Patient agrees to discharge.  Patient goals were not met. Patient is being discharged due to not returning since the last visit.  ?????           Beaulah Dinning, PT, DPT 06/17/20 12:57 PM       [facility] [address] [place], [place], [address] Phone: [phone]   Fax:  [phone]  Name: Isaac Patel MRN: [id number] Date of Birth: 1965-07-12

## 2020-06-26 ENCOUNTER — Ambulatory Visit: Payer: 59 | Admitting: Neurology

## 2020-06-26 ENCOUNTER — Encounter: Payer: Self-pay | Admitting: Neurology

## 2020-06-26 ENCOUNTER — Other Ambulatory Visit: Payer: Self-pay

## 2020-06-26 VITALS — BP 137/90 | HR 89 | Ht 71.0 in | Wt 197.0 lb

## 2020-06-26 DIAGNOSIS — M4802 Spinal stenosis, cervical region: Secondary | ICD-10-CM

## 2020-06-26 DIAGNOSIS — M47812 Spondylosis without myelopathy or radiculopathy, cervical region: Secondary | ICD-10-CM | POA: Diagnosis not present

## 2020-06-26 NOTE — Progress Notes (Signed)
Isaac Patel - Initial Visit   Date: 06/26/20  Isaac Patel MRN: [id number] DOB: [birth year]   Dear Dr. Jenny Reichmann:  Thank you for your kind referral of Isaac Patel for consultation of neck pain. Although his history is well known to you, please allow [place] to reiterate it for the purpose of our medical record. The patient was accompanied to the clinic by self.    History of Present Illness: Isaac Patel is a 55 y.o. right-handed male with CAD, hypertension, CHF, hyperlipidemia, tobacco use, and GERD presenting for evaluation of neck pain and bilateral arm pain. Starting around the fall of 2021, he began having neck pain with radicular arm pain.  He also has numbness/tingling of the right hand.  He has been doing physical therapy with variable benefit.  MRI cervical spine from late February shows cervical spondylosis with severe central canal stenosis and cord impingement at C4-5 and C5-6.  He suffered industrial accident in the left forearm below the elbow and has prosthetic left arm.   Out-side paper records, electronic medical record, and images have been reviewed where available and summarized as:  MRI cervical spine wo contrast 05/26/2020: 1. Cervical spondylosis and degenerative disc disease causing prominent impingement at C4-5 and C5-6; moderate impingement at C3-4 and C6-7; and mild impingement at C2-3.  Lab Results  Component Value Date   HGBA1C 6.1 12/10/2019   Lab Results  Component Value Date   IFOYDXAJ28 786 11/07/2018   Lab Results  Component Value Date   TSH 1.64 12/10/2019   Lab Results  Component Value Date   ESRSEDRATE 4 06/07/2018    Past Medical History:  Diagnosis Date  . Aortic insufficiency    Status post aortic valve repair // Echo 8/20: EF 50-55, moderate LVH, grade 1 diastolic dysfunction, normal RV SF, status post AV repair, mild AI, mild dilation of aortic root (38 mm)  . CAD (coronary artery disease)     a.  Non-STEMI 8/16: LHC-proximal LAD 80% treated with a resolute DES, EF 50-55%;  b.  Echo 8/16:  Moderate LVH, EF 55-60%, normal wall motion, normal diastolic function, normal RV function  . CHF (congestive heart failure) ([place])   . Dyspnea    mild  . GERD (gastroesophageal reflux disease)   . Headache    "maybe monthly" (10/28/2014)  . HLD (hyperlipidemia)   . Hypertension   . NSTEMI (non-ST elevated myocardial infarction) ([place]) 10/28/2014  . OSA (obstructive sleep apnea)    "tried mask; wear it off and on" (10/28/2014)  . S/P aortic valve repair 09/15/2018   Complex valvuloplasty including plication of prolapsing right coronary cusp with Biostable HAART 300 aortic annuloplasty, size 21 mm    Past Surgical History:  Procedure Laterality Date  . AORTIC VALVE REPAIR N/A 09/15/2018   Procedure: AORTIC VALVE REPAIR USING HAART 300 AORTIC ANNULOPLASTY DEVICE SIZE 21MM;  Surgeon: Rexene Alberts, MD;  Location: [place];  Service: Open Heart Surgery;  Laterality: N/A;  . 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 CARDIAC CATHETERIZATION N/A 10/29/2014   Procedure: Left Heart Cath and Coronary Angiography;  Surgeon: Troy Sine, MD;  Location: [place] [facility];  Service: Cardiovascular;  Laterality: N/A;  . CHOLESTEATOMA EXCISION Right 1990's  . LEFT HEART CATH AND CORONARY ANGIOGRAPHY N/A 05/25/2016   Procedure: Left Heart Cath and Coronary Angiography;  Surgeon: Sherren Mocha, MD;  Location: [place] [facility];  Service: Cardiovascular;  Laterality: N/A;  .  MULTIPLE TOOTH EXTRACTIONS  08/29/2018  . NASAL SEPTOPLASTY W/ TURBINOPLASTY Bilateral 11/30/2017   Procedure: NASAL SEPTOPLASTY WITH TURBINATE REDUCTION;  Surgeon: Jerrell Belfast, MD;  Location: [place];  Service: ENT;  Laterality: Bilateral;  . RIGHT/LEFT HEART CATH AND CORONARY ANGIOGRAPHY N/A 08/11/2018   Procedure: RIGHT/LEFT HEART CATH AND CORONARY ANGIOGRAPHY;  Surgeon: Burnell Blanks, MD;  Location: [place] [facility];  Service: Cardiovascular;  Laterality: N/A;  . TEE WITHOUT CARDIOVERSION N/A 06/08/2018   Procedure: TRANSESOPHAGEAL ECHOCARDIOGRAM (TEE);  Surgeon: Buford Dresser, MD;  Location[facility];  Service: Cardiovascular;  Laterality: N/A;  . TEE WITHOUT CARDIOVERSION N/A 09/15/2018   Procedure: TRANSESOPHAGEAL ECHOCARDIOGRAM (TEE);  Surgeon: Rexene Alberts, MD;  Location: [place];  Service: Open Heart Surgery;  Laterality: N/A;  . TYMPANOSTOMY TUBE PLACEMENT Bilateral "as a kid"   "had one done as an adult too"  . WISDOM TOOTH EXTRACTION       Medications:  Outpatient Encounter Medications as of 06/26/2020  Medication Sig  . aspirin EC 81 MG tablet Take 81 mg by mouth daily.  Marland Kitchen atorvastatin (LIPITOR) 80 MG tablet Take 1 tablet (80 mg total) by mouth daily at 6 PM.  . calcium carbonate (TUMS - DOSED IN MG ELEMENTAL CALCIUM) 500 MG chewable tablet Chew 2 tablets by mouth daily as needed for indigestion or heartburn.  . fluticasone (FLONASE) 50 MCG/ACT nasal spray Place 1 spray into both nostrils daily as needed for allergies or rhinitis.  Marland Kitchen gabapentin (NEURONTIN) 100 MG capsule Take 2 capsules (200 mg total) by mouth at bedtime.  Marland Kitchen loratadine (CLARITIN) 10 MG tablet Take 10 mg by mouth daily.  . metoprolol tartrate (LOPRESSOR) 25 MG tablet Take 1 tablet (25 mg total) by mouth 2 (two) times daily.  . Multiple Vitamin (MULTI-VITAMIN PO) Take 1 tablet by mouth daily.  . nitroGLYCERIN (NITROSTAT) 0.4 MG SL tablet Place 1 tablet (0.4 mg total) under the tongue every 5 (five) minutes x 3 doses as needed for chest pain.  . pantoprazole (PROTONIX) 40 MG tablet TAKE 1 TABLET (40 MG TOTAL) BY MOUTH DAILY.  . [DISCONTINUED] predniSONE (DELTASONE) 10 MG tablet 3 tabs by mouth per day for 3 days,2tabs per day for 3 days,1tab per day for 3 days   No facility-administered encounter medications on file as of 06/26/2020.    Allergies:  Allergies  Allergen Reactions  . Morphine And  Related Itching    Family History: Family History  Problem Relation Age of Onset  . Diverticulitis Mother   . Skin cancer Mother   . Healthy Father   . Bradycardia Father   . Multiple sclerosis Maternal Grandfather   . Colon cancer Neg Hx   . Stomach cancer Neg Hx   . Rectal cancer Neg Hx   . Liver cancer Neg Hx   . Esophageal cancer Neg Hx     Social History: Social History   Tobacco Use  . Smoking status: Current Every Day Smoker    Packs/day: 0.33    Years: 32.00    Pack years: 10.56    Types: Cigarettes    Last attempt to quit: 07/28/2018    Years since quitting: 1.9  . Smokeless tobacco: Never Used  . Tobacco comment: 6 cigarettes per day 08/28/19 [facility]   Vaping Use  . Vaping Use: Former  Substance Use Topics  . Alcohol use: Yes    Alcohol/week: 2.0 - 3.0 standard drinks    Types: 2 - 3 Standard drinks or equivalent per  week    Comment: rare  . Drug use: Not Currently    Types: Marijuana    Comment: 05/24/2016  "stopped marijuana in the early 2000's"   Social History   Social History Narrative   Born and raised in [place], [place].  Currently resides in a house with his wife and children. 1 dog. Fun: play computer games, sports.    Denies any religious beliefs effecting health care.    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  Speech is not dysarthric.  CRANIAL NERVES: II:  No visual field defects.   III-IV-VI: Pupils equal round and reactive to light.  Normal conjugate, extra-ocular eye movements in all directions of gaze.  No nystagmus.  No ptosis.   V:  Normal facial sensation.    VII:  Normal facial symmetry and movements.    VIII:  Normal hearing and vestibular function.   IX-X:  Normal palatal movement.   XI:  Normal shoulder shrug and head rotation.   XII:  Normal tongue strength and range of motion, no deviation or fasciculation.  MOTOR:  Left below elbow amputation. No atrophy, fasciculations or abnormal movements.  No pronator drift.   Upper Extremity:  Right  Left  Deltoid  5/5   5/5   Biceps  5/5     Triceps  5/5     Infraspinatus 5/5    Medial pectoralis 5/5    Wrist extensors  5/5     Wrist flexors  5/5     Finger extensors  5/5     Finger flexors  5/5     Dorsal interossei  5/5     Abductor pollicis  5/5     Tone (Ashworth scale)  0  0   Lower Extremity:  Right  Left  Hip flexors  5/5   5/5   Hip extensors  5/5   5/5   Adductor 5/5  5/5  Abductor 5/5  5/5  Knee flexors  5/5   5/5   Knee extensors  5/5   5/5   Dorsiflexors  5/5   5/5   Plantarflexors  5/5   5/5   Toe extensors  5/5   5/5   Toe flexors  5/5   5/5   Tone (Ashworth scale)  0  0   MSRs:  Right        Left                  brachioradialis 2+  2+  biceps 2+    triceps 2+    patellar 2+  2+  ankle jerk 1+  1+  Hoffman no  no  plantar response down  down   SENSORY:  Normal and symmetric perception of light touch, pinprick, vibration, and proprioception.   COORDINATION/GAIT: Normal finger-to- nose-finger.  Gait narrow based and stable. Tandem and stressed gait intact.    IMPRESSION: Cervical spondylosis with severe central canal stenosis causing impingement at C4-5 and C5-6.  Imaging reviewed with patient as well as management options.  He is currently doing PT, but continue  to have neck pain and radicular symptoms.  With the severity of findings on imaging, I will refer him to see neurosurgery for surgical evaluation.  He is not keen on surgery, but interested in hearing more about his management options.    Thank you for allowing me to participate in patient's care.  If I can answer any additional questions, I  would be pleased to do so.    Sincerely,    Ivi Griffith K. Posey Pronto, DO

## 2020-06-26 NOTE — Patient Instructions (Signed)
We will refer you to [facility]

## 2020-07-10 DIAGNOSIS — M5412 Radiculopathy, cervical region: Secondary | ICD-10-CM | POA: Diagnosis not present

## 2020-07-15 ENCOUNTER — Encounter: Payer: Self-pay | Admitting: Internal Medicine

## 2020-07-17 ENCOUNTER — Encounter: Payer: Self-pay | Admitting: Internal Medicine

## 2020-07-17 ENCOUNTER — Other Ambulatory Visit: Payer: Self-pay

## 2020-07-17 ENCOUNTER — Ambulatory Visit: Payer: 59 | Admitting: Internal Medicine

## 2020-07-17 VITALS — BP 140/86 | HR 68 | Ht 71.0 in | Wt 200.0 lb

## 2020-07-17 DIAGNOSIS — E538 Deficiency of other specified B group vitamins: Secondary | ICD-10-CM

## 2020-07-17 DIAGNOSIS — E559 Vitamin D deficiency, unspecified: Secondary | ICD-10-CM | POA: Diagnosis not present

## 2020-07-17 DIAGNOSIS — R7303 Prediabetes: Secondary | ICD-10-CM | POA: Diagnosis not present

## 2020-07-17 DIAGNOSIS — I10 Essential (primary) hypertension: Secondary | ICD-10-CM | POA: Diagnosis not present

## 2020-07-17 DIAGNOSIS — Z0000 Encounter for general adult medical examination without abnormal findings: Secondary | ICD-10-CM

## 2020-07-17 DIAGNOSIS — S58112S Complete traumatic amputation at level between elbow and wrist, left arm, sequela: Secondary | ICD-10-CM

## 2020-07-17 DIAGNOSIS — T85698A Other mechanical complication of other specified internal prosthetic devices, implants and grafts, initial encounter: Secondary | ICD-10-CM

## 2020-07-17 DIAGNOSIS — M542 Cervicalgia: Secondary | ICD-10-CM

## 2020-07-17 NOTE — Progress Notes (Signed)
[name], male   DOB: [birth year], 55 y.o.   MRN: [id number]        Chief Complaint:        HPI:  Isaac Patel is a 55 y.o. male here with c/o malfunctioning left arm prosthesis due to wear and tear, specifically an o ring type bearing collar at the wrist has cracked and broken leading to loss of all ball bearings associated and now non function of the wrist and hand.  He had this particular arm for over 10 yrs, and hand/wrist for over 3 yrs.  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  Has appt to see [employer] Monday at 1 pm for the the arm to be sent for repair.  Recent left cervical radiculitis maybe improved after saw neurology Dr patel then ortho Dr Zada Finders, s/p MRI , then recommended for PT and pt is considering ESI, and is not been considering surgury so far.   Pt denies polydipsia, polyuria,      Wt Readings from Last 3 Encounters:  07/17/20 200 lb (90.7 kg)  06/26/20 197 lb (89.4 kg)  04/30/20 200 lb (90.7 kg)   BP Readings from Last 3 Encounters:  07/17/20 140/86  06/26/20 137/90  04/30/20 124/76         Past Medical History:  Diagnosis Date  . Aortic insufficiency    Status post aortic valve repair // Echo 8/20: EF 50-55, moderate LVH, grade 1 diastolic dysfunction, normal RV SF, status post AV repair, mild AI, mild dilation of aortic root (38 mm)  . CAD (coronary artery disease)    a.  Non-STEMI 8/16: LHC-proximal LAD 80% treated with a resolute DES, EF 50-55%;  b.  Echo 8/16:  Moderate LVH, EF 55-60%, normal wall motion, normal diastolic function, normal RV function  . CHF (congestive heart failure) ([place])   . Dyspnea    mild  . GERD (gastroesophageal reflux disease)   . Headache    "maybe monthly" (10/28/2014)  . HLD (hyperlipidemia)   . Hypertension   . NSTEMI (non-ST elevated myocardial infarction) ([place]) 10/28/2014  . OSA (obstructive sleep apnea)    "tried mask;  wear it off and on" (10/28/2014)  . 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 AORTIC VALVE REPAIR N/A 09/15/2018   Procedure: AORTIC VALVE REPAIR USING HAART 300 AORTIC ANNULOPLASTY DEVICE SIZE 21MM;  Surgeon: Rexene Alberts, MD;  Location: [place];  Service: Open Heart Surgery;  Laterality: N/A;  . 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 CARDIAC CATHETERIZATION N/A 10/29/2014   Procedure: Left Heart Cath and Coronary Angiography;  Surgeon: Troy Sine, MD;  Location: [place] [facility];  Service: Cardiovascular;  Laterality: N/A;  . CHOLESTEATOMA EXCISION Right 1990's  . LEFT HEART CATH AND CORONARY ANGIOGRAPHY N/A 05/25/2016   Procedure: Left Heart Cath and Coronary Angiography;  Surgeon: Sherren Mocha, MD;  Location: [place] [facility];  Service: Cardiovascular;  Laterality: N/A;  . MULTIPLE TOOTH EXTRACTIONS  08/29/2018  . NASAL SEPTOPLASTY W/ TURBINOPLASTY Bilateral 11/30/2017   Procedure: NASAL SEPTOPLASTY WITH TURBINATE REDUCTION;  Surgeon: Jerrell Belfast, MD;  Location: [place];  Service: ENT;  Laterality: Bilateral;  . RIGHT/LEFT HEART CATH AND CORONARY ANGIOGRAPHY N/A 08/11/2018  Procedure: RIGHT/LEFT HEART CATH AND CORONARY ANGIOGRAPHY;  Surgeon: Burnell Blanks, MD;  Location: [place] [facility];  Service: Cardiovascular;  Laterality: N/A;  . TEE WITHOUT CARDIOVERSION N/A 06/08/2018   Procedure: TRANSESOPHAGEAL ECHOCARDIOGRAM (TEE);  Surgeon: Buford Dresser, MD;  [facility];  Service: Cardiovascular;  Laterality: N/A;  . TEE WITHOUT CARDIOVERSION N/A 09/15/2018   Procedure: TRANSESOPHAGEAL ECHOCARDIOGRAM (TEE);  Surgeon: Rexene Alberts, MD;  Location: [place];  Service: Open Heart Surgery;  Laterality: N/A;  . TYMPANOSTOMY TUBE PLACEMENT Bilateral "as a kid"    "had one done as an adult too"  . WISDOM TOOTH EXTRACTION      reports that he has been smoking cigarettes. He has a 10.56 pack-year smoking history. He has never used smokeless tobacco. He reports current alcohol use of about 2.0 - 3.0 standard drinks of alcohol per week. He reports previous drug use. Drug: Marijuana. family history includes Bradycardia in his father; Diverticulitis in his mother; Healthy in his father; Multiple sclerosis in his maternal grandfather; Skin cancer in his mother. Allergies  Allergen Reactions  . Morphine And Related Itching   Current Outpatient Medications on File Prior to Visit  Medication Sig Dispense Refill  . aspirin EC 81 MG tablet Take 81 mg by mouth daily.    Marland Kitchen atorvastatin (LIPITOR) 80 MG tablet TAKE 1 TABLET (80 MG TOTAL) BY MOUTH DAILY AT 6 PM. (Patient taking differently: Take by mouth daily. at 6pm) 90 tablet 1  . calcium carbonate (TUMS - DOSED IN MG ELEMENTAL CALCIUM) 500 MG chewable tablet Chew 2 tablets by mouth daily as needed for indigestion or heartburn.    . fluticasone (FLONASE) 50 MCG/ACT nasal spray Place 1 spray into both nostrils daily as needed for allergies or rhinitis.    Marland Kitchen gabapentin (NEURONTIN) 100 MG capsule TAKE 2 CAPSULES (200 MG TOTAL) BY MOUTH AT BEDTIME. 180 capsule 0  . loratadine (CLARITIN) 10 MG tablet Take 10 mg by mouth daily.    . metoprolol tartrate (LOPRESSOR) 25 MG tablet TAKE 1 TABLET (25 MG TOTAL) BY MOUTH 2 (TWO) TIMES DAILY. 180 tablet 1  . Multiple Vitamin (MULTI-VITAMIN PO) Take 1 tablet by mouth daily.    . nitroGLYCERIN (NITROSTAT) 0.4 MG SL tablet Place 1 tablet (0.4 mg total) under the tongue every 5 (five) minutes x 3 doses as needed for chest pain. 25 tablet 3  . pantoprazole (PROTONIX) 40 MG tablet TAKE 1 TABLET (40 MG TOTAL) BY MOUTH DAILY. 90 tablet 1   No current facility-administered medications on file prior to visit.        ROS:  All others reviewed and negative.  Objective        PE:  BP  140/86 (BP Location: Right Arm, Patient Position: Sitting, Cuff Size: Large)   Pulse 68   Ht 5\' 11"  (1.803 m)   Wt 200 lb (90.7 kg)   SpO2 98%   BMI 27.89 kg/m                 Constitutional: Pt appears in NAD               HENT: Head: NCAT.                Right Ear: External ear normal.                 Left Ear: External ear normal.                Eyes: .  Pupils are equal, round, and reactive to light. Conjunctivae and EOM are normal               Nose: without d/c or deformity               Neck: Neck supple. Gross normal ROM               Cardiovascular: Normal rate and regular rhythm.                 Pulmonary/Chest: Effort normal and breath sounds without rales or wheezing.                Abd:  Soft, NT, ND, + BS, no organomegaly               S/p left arm amputation below elbow; has one functional pinching type prosthesis, but other faulty prosthesis clearly has broken elements                Neurological: Pt is alert. At baseline orientation, motor grossly intact               Skin: Skin is warm. No rashes, no other new lesions, LE edema - none               Psychiatric: Pt behavior is normal without agitation   Micro: none  Cardiac tracings I have personally interpreted today:  none  Pertinent Radiological findings (summarize): none   Lab Results  Component Value Date   WBC 7.3 04/27/2020   HGB 16.1 04/27/2020   HCT 44.7 04/27/2020   PLT 219 04/27/2020   GLUCOSE 101 (H) 04/27/2020   CHOL 113 12/10/2019   TRIG 86.0 12/10/2019   HDL 38.70 (L) 12/10/2019   LDLCALC 57 12/10/2019   ALT 17 12/10/2019   AST 15 12/10/2019   NA 139 04/27/2020   K 3.6 04/27/2020   CL 106 04/27/2020   CREATININE 0.99 04/27/2020   BUN 18 04/27/2020   CO2 23 04/27/2020   TSH 1.64 12/10/2019   PSA 0.89 12/10/2019   INR 1.4 (H) 09/15/2018   HGBA1C 6.1 12/10/2019   Assessment/Plan:  Isaac Patel is a 55 y.o. White or Caucasian [1] male with  has a past medical history of Aortic  insufficiency, CAD (coronary artery disease), CHF (congestive heart failure) ([place]), Dyspnea, GERD (gastroesophageal reflux disease), Headache, HLD (hyperlipidemia), Hypertension, NSTEMI (non-ST elevated myocardial infarction) ([place]) (10/28/2014), OSA (obstructive sleep apnea), and S/P aortic valve repair (09/15/2018).  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  Cathlean Cower, MD 07/20/2020 4:15 PM [facility]

## 2020-07-17 NOTE — Patient Instructions (Addendum)
We will send the notes to [employer]  Please continue all other medications as before, and refills have been done if requested.  Please have the pharmacy call with any other refills you may need.  Please continue your efforts at being more active, low cholesterol diet, and weight control.  Please keep your appointments with your specialists as you may have planned  Please make an Appointment to return in May 2 as planned, with labs done at the [place] a few days ahead

## 2020-07-20 ENCOUNTER — Encounter: Payer: Self-pay | Admitting: Internal Medicine

## 2020-07-20 NOTE — Assessment & Plan Note (Signed)
Prosthesis clearly has broken elements, has appt mon 1 pm with [employer] for assessment for repair and/or replace; will need note today faxed

## 2020-07-20 NOTE — Assessment & Plan Note (Signed)
Somewhat improved, cont PT , may need ESI per ortho

## 2020-07-20 NOTE — Assessment & Plan Note (Signed)
Lab Results  Component Value Date   HGBA1C 6.1 12/10/2019   Stable, pt to continue current medical treatment  - diet

## 2020-07-21 ENCOUNTER — Other Ambulatory Visit (HOSPITAL_COMMUNITY): Payer: Self-pay

## 2020-07-21 MED FILL — Pantoprazole Sodium EC Tab 40 MG (Base Equiv): ORAL | 90 days supply | Qty: 90 | Fill #0 | Status: AC

## 2020-07-21 MED FILL — Metoprolol Tartrate Tab 25 MG: ORAL | 90 days supply | Qty: 180 | Fill #0 | Status: AC

## 2020-07-21 MED FILL — Atorvastatin Calcium Tab 80 MG (Base Equivalent): ORAL | 90 days supply | Qty: 90 | Fill #0 | Status: AC

## 2020-07-23 ENCOUNTER — Encounter: Payer: Self-pay | Admitting: Internal Medicine

## 2020-07-24 ENCOUNTER — Other Ambulatory Visit (INDEPENDENT_AMBULATORY_CARE_PROVIDER_SITE_OTHER): Payer: 59

## 2020-07-24 DIAGNOSIS — I10 Essential (primary) hypertension: Secondary | ICD-10-CM

## 2020-07-24 DIAGNOSIS — Z0000 Encounter for general adult medical examination without abnormal findings: Secondary | ICD-10-CM

## 2020-07-24 DIAGNOSIS — E538 Deficiency of other specified B group vitamins: Secondary | ICD-10-CM | POA: Diagnosis not present

## 2020-07-24 DIAGNOSIS — R7303 Prediabetes: Secondary | ICD-10-CM | POA: Diagnosis not present

## 2020-07-24 DIAGNOSIS — E559 Vitamin D deficiency, unspecified: Secondary | ICD-10-CM | POA: Diagnosis not present

## 2020-07-24 LAB — CBC WITH DIFFERENTIAL/PLATELET
Basophils Absolute: 0.1 10*3/uL (ref 0.0–0.1)
Basophils Relative: 1 % (ref 0.0–3.0)
Eosinophils Absolute: 0.4 10*3/uL (ref 0.0–0.7)
Eosinophils Relative: 4.4 % (ref 0.0–5.0)
HCT: 45.5 % (ref 39.0–52.0)
Hemoglobin: 15.4 g/dL (ref 13.0–17.0)
Lymphocytes Relative: 25.3 % (ref 12.0–46.0)
Lymphs Abs: 2.2 10*3/uL (ref 0.7–4.0)
MCHC: 33.8 g/dL (ref 30.0–36.0)
MCV: 96.9 fl (ref 78.0–100.0)
Monocytes Absolute: 0.8 10*3/uL (ref 0.1–1.0)
Monocytes Relative: 9.6 % (ref 3.0–12.0)
Neutro Abs: 5.2 10*3/uL (ref 1.4–7.7)
Neutrophils Relative %: 59.7 % (ref 43.0–77.0)
Platelets: 227 10*3/uL (ref 150.0–400.0)
RBC: 4.7 Mil/uL (ref 4.22–5.81)
RDW: 13.4 % (ref 11.5–15.5)
WBC: 8.7 10*3/uL (ref 4.0–10.5)

## 2020-07-24 LAB — URINALYSIS, ROUTINE W REFLEX MICROSCOPIC
Bilirubin Urine: NEGATIVE
Leukocytes,Ua: NEGATIVE
Nitrite: NEGATIVE
Specific Gravity, Urine: >=1.03 — AB (ref 1.000–1.030)
Total Protein, Urine: NEGATIVE
Urine Glucose: NEGATIVE
Urobilinogen, UA: 0.2 (ref 0.0–1.0)
WBC, UA: NONE SEEN (ref 0–?)
pH: 5 (ref 5.0–8.0)

## 2020-07-24 LAB — PSA: PSA: 0.85 ng/mL (ref 0.10–4.00)

## 2020-07-24 LAB — HEMOGLOBIN A1C: Hgb A1c MFr Bld: 5.8 % (ref 4.6–6.5)

## 2020-07-24 LAB — TSH: TSH: 1.11 u[IU]/mL (ref 0.35–4.50)

## 2020-07-25 ENCOUNTER — Ambulatory Visit: Payer: 59 | Admitting: Internal Medicine

## 2020-07-25 ENCOUNTER — Other Ambulatory Visit: Payer: Self-pay

## 2020-07-25 ENCOUNTER — Encounter: Payer: Self-pay | Admitting: Internal Medicine

## 2020-07-25 VITALS — BP 120/76 | HR 67 | Temp 98.1°F | Ht 71.0 in | Wt 196.0 lb

## 2020-07-25 DIAGNOSIS — Z0000 Encounter for general adult medical examination without abnormal findings: Secondary | ICD-10-CM | POA: Diagnosis not present

## 2020-07-25 DIAGNOSIS — E7800 Pure hypercholesterolemia, unspecified: Secondary | ICD-10-CM

## 2020-07-25 DIAGNOSIS — I10 Essential (primary) hypertension: Secondary | ICD-10-CM | POA: Diagnosis not present

## 2020-07-25 DIAGNOSIS — R7303 Prediabetes: Secondary | ICD-10-CM | POA: Diagnosis not present

## 2020-07-25 LAB — BASIC METABOLIC PANEL
BUN: 23 mg/dL (ref 6–23)
CO2: 27 mEq/L (ref 19–32)
Calcium: 9.3 mg/dL (ref 8.4–10.5)
Chloride: 104 mEq/L (ref 96–112)
Creatinine, Ser: 1.11 mg/dL (ref 0.40–1.50)
GFR: 74.69 mL/min (ref >60.00)
Glucose, Bld: 103 mg/dL — ABNORMAL HIGH (ref 70–99)
Potassium: 3.9 mEq/L (ref 3.5–5.1)
Sodium: 142 mEq/L (ref 135–145)

## 2020-07-25 LAB — HEPATIC FUNCTION PANEL
ALT: 21 U/L (ref 0–53)
AST: 19 U/L (ref 0–37)
Albumin: 4.4 g/dL (ref 3.5–5.2)
Alkaline Phosphatase: 79 U/L (ref 39–117)
Bilirubin, Direct: 0.1 mg/dL (ref 0.0–0.3)
Total Bilirubin: 0.4 mg/dL (ref 0.2–1.2)
Total Protein: 6.6 g/dL (ref 6.0–8.3)

## 2020-07-25 LAB — VITAMIN D 25 HYDROXY (VIT D DEFICIENCY, FRACTURES): VITD: 30.13 ng/mL (ref 30.00–100.00)

## 2020-07-25 LAB — LIPID PANEL
Cholesterol: 128 mg/dL (ref 0–200)
HDL: 42.2 mg/dL (ref >39.00)
LDL Cholesterol: 51 mg/dL (ref 0–99)
NonHDL: 85.71
Total CHOL/HDL Ratio: 3
Triglycerides: 172 mg/dL — ABNORMAL HIGH (ref 0.0–149.0)
VLDL: 34.4 mg/dL (ref 0.0–40.0)

## 2020-07-25 LAB — VITAMIN B12: Vitamin B-12: 361 pg/mL (ref 211–911)

## 2020-07-25 NOTE — Progress Notes (Signed)
[name], male   DOB: Apr 11, 1965, 55 y.o.   MRN: [id number]         Chief Complaint:: wellness exam       HPI:  Isaac Patel is a 55 y.o. male here for wellness exam; has yearly echo soon for post AV surgury followup; o/w up to date with preventive referrals and immunizations.                         Also Pt denies chest pain, increased sob or doe, wheezing, orthopnea, PND, increased LE swelling, palpitations, dizziness or syncope.   Pt denies polydipsia, polyuria, Denies new focal neuro s/s.  Did meet with biotech and working on new prostheses vs repair.   Pt denies fever, wt loss, night sweats, loss of appetite, or other constitutional symptoms  No other new complaints   Wt Readings from Last 3 Encounters:  07/25/20 196 lb (88.9 kg)  07/17/20 200 lb (90.7 kg)  06/26/20 197 lb (89.4 kg)   BP Readings from Last 3 Encounters:  07/25/20 120/76  07/17/20 140/86  06/26/20 137/90   Immunization History  Administered Date(s) Administered  . Influenza,inj,Quad PF,6+ Mos 12/27/2017  . Influenza-Unspecified 01/06/2015  . PFIZER(Purple Top)SARS-COV-2 Vaccination 05/13/2019, 05/30/2019  . Tdap 09/21/2017  . Zoster Recombinat (Shingrix) 12/06/2019, 04/30/2020  There are no preventive care reminders to display for this patient.    Past Medical History:  Diagnosis Date  . Aortic insufficiency    Status post aortic valve repair // Echo 8/20: EF 50-55, moderate LVH, grade 1 diastolic dysfunction, normal RV SF, status post AV repair, mild AI, mild dilation of aortic root (38 mm)  . CAD (coronary artery disease)    a.  Non-STEMI 8/16: LHC-proximal LAD 80% treated with a resolute DES, EF 50-55%;  b.  Echo 8/16:  Moderate LVH, EF 55-60%, normal wall motion, normal diastolic function, normal RV function  . CHF (congestive heart failure) ([place])   . Dyspnea    mild  . GERD (gastroesophageal reflux disease)   . Headache    "maybe monthly" (10/28/2014)  . HLD (hyperlipidemia)   .  Hypertension   . NSTEMI (non-ST elevated myocardial infarction) ([place]) 10/28/2014  . OSA (obstructive sleep apnea)    "tried mask; wear it off and on" (10/28/2014)  . 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 AORTIC VALVE REPAIR N/A 09/15/2018   Procedure: AORTIC VALVE REPAIR USING HAART 300 AORTIC ANNULOPLASTY DEVICE SIZE 21MM;  Surgeon: Rexene Alberts, MD;  Location: [facility];  Service: Open Heart Surgery;  Laterality: N/A;  . 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 CARDIAC CATHETERIZATION N/A 10/29/2014   Procedure: Left Heart Cath and Coronary Angiography;  Surgeon: Troy Sine, MD;  Location: [facility];  Service: Cardiovascular;  Laterality: N/A;  . CHOLESTEATOMA EXCISION Right 1990's  . LEFT HEART CATH AND CORONARY ANGIOGRAPHY N/A 05/25/2016   Procedure: Left Heart Cath and Coronary Angiography;  Surgeon: Sherren Mocha, MD;  Location: [facility];  Service: Cardiovascular;  Laterality: N/A;  . MULTIPLE TOOTH EXTRACTIONS  08/29/2018  . NASAL SEPTOPLASTY W/ TURBINOPLASTY Bilateral 11/30/2017   Procedure: NASAL SEPTOPLASTY WITH TURBINATE REDUCTION;  Surgeon: Jerrell Belfast, MD;  Location: [facility];  Service: ENT;  Laterality: Bilateral;  .  RIGHT/LEFT HEART CATH AND CORONARY ANGIOGRAPHY N/A 08/11/2018   Procedure: RIGHT/LEFT HEART CATH AND CORONARY ANGIOGRAPHY;  Surgeon: Burnell Blanks, MD;  Location: [facility];  Service: Cardiovascular;  Laterality: N/A;  . TEE WITHOUT CARDIOVERSION N/A 06/08/2018   Procedure: TRANSESOPHAGEAL ECHOCARDIOGRAM (TEE);  Surgeon: Buford Dresser, MD;  Location: [facility];  Service: Cardiovascular;  Laterality: N/A;  . TEE WITHOUT CARDIOVERSION N/A 09/15/2018   Procedure: TRANSESOPHAGEAL ECHOCARDIOGRAM (TEE);  Surgeon: Rexene Alberts, MD;  Location: [facility];  Service: Open Heart Surgery;  Laterality: N/A;  . TYMPANOSTOMY TUBE PLACEMENT Bilateral "as a kid"   "had one done as an adult too"  . WISDOM TOOTH EXTRACTION      reports that he has been smoking cigarettes. He has a 10.56 pack-year smoking history. He has never used smokeless tobacco. He reports current alcohol use of about 2.0 - 3.0 standard drinks of alcohol per week. He reports previous drug use. Drug: Marijuana. family history includes Bradycardia in his father; Diverticulitis in his mother; Healthy in his father; Multiple sclerosis in his maternal grandfather; Skin cancer in his mother. Allergies  Allergen Reactions  . Morphine And Related Itching   Current Outpatient Medications on File Prior to Visit  Medication Sig Dispense Refill  . aspirin EC 81 MG tablet Take 81 mg by mouth daily.    Marland Kitchen atorvastatin (LIPITOR) 80 MG tablet TAKE 1 TABLET (80 MG TOTAL) BY MOUTH DAILY AT 6 PM. 90 tablet 1  . calcium carbonate (TUMS - DOSED IN MG ELEMENTAL CALCIUM) 500 MG chewable tablet Chew 2 tablets by mouth daily as needed for indigestion or heartburn.    . fluticasone (FLONASE) 50 MCG/ACT nasal spray Place 1 spray into both nostrils daily as needed for allergies or rhinitis.    Marland Kitchen gabapentin (NEURONTIN) 100 MG capsule TAKE 2 CAPSULES (200 MG TOTAL) BY MOUTH AT BEDTIME. 180 capsule 0  . loratadine (CLARITIN) 10 MG tablet Take 10 mg by mouth daily.    . metoprolol tartrate (LOPRESSOR) 25 MG tablet TAKE 1 TABLET (25 MG TOTAL) BY MOUTH 2 (TWO) TIMES DAILY. 180 tablet 1  . Multiple Vitamin (MULTI-VITAMIN PO) Take 1 tablet by mouth daily.    . nitroGLYCERIN (NITROSTAT) 0.4 MG SL tablet Place 1 tablet (0.4 mg total) under the tongue every 5 (five) minutes x 3 doses as needed for chest pain. 25 tablet 3  . pantoprazole (PROTONIX) 40 MG tablet TAKE 1 TABLET (40 MG TOTAL) BY MOUTH DAILY. 90 tablet 1   No current facility-administered medications on file prior to visit.         ROS:  All others reviewed and negative.  Objective        PE:  BP 120/76   Pulse 67   Temp 98.1 F (36.7 C) (Oral)   Ht 5\' 11"  (1.803 m)   Wt 196 lb (88.9 kg)   SpO2 97%   BMI 27.34 kg/m                 Constitutional: Pt appears in NAD               HENT: Head: NCAT.                Right Ear: External ear normal.                 Left Ear: External ear normal.                Eyes: . Pupils  are equal, round, and reactive to light. Conjunctivae and EOM are normal               Nose: without d/c or deformity               Neck: Neck supple. Gross normal ROM               Cardiovascular: Normal rate and regular rhythm.                 Pulmonary/Chest: Effort normal and breath sounds without rales or wheezing.                Abd:  Soft, NT, ND, + BS, no organomegaly               S/p left arm amputation below the elbow               Neurological: Pt is alert. At baseline orientation, motor grossly intact               Skin: Skin is warm. No rashes, no other new lesions, LE edema - none               Psychiatric: Pt behavior is normal without agitation   Micro: none  Cardiac tracings I have personally interpreted today:  none  Pertinent Radiological findings (summarize): none   Lab Results  Component Value Date   WBC 8.7 07/24/2020   HGB 15.4 07/24/2020   HCT 45.5 07/24/2020   PLT 227.0 07/24/2020   GLUCOSE 103 (H) 07/24/2020   CHOL 128 07/24/2020   TRIG 172.0 (H) 07/24/2020   HDL 42.20 07/24/2020   LDLCALC 51 07/24/2020   ALT 21 07/24/2020   AST 19 07/24/2020   NA 142 07/24/2020   K 3.9 07/24/2020   CL 104 07/24/2020   CREATININE 1.11 07/24/2020   BUN 23 07/24/2020   CO2 27 07/24/2020   TSH 1.11 07/24/2020   PSA 0.85 07/24/2020   INR 1.4 (H) 09/15/2018   HGBA1C 5.8 07/24/2020   Assessment/Plan:  Isaac Patel is a 56 y.o. White or Caucasian [1] male with  has a past medical history of Aortic insufficiency, CAD (coronary artery disease), CHF (congestive  heart failure) ([place]), Dyspnea, GERD (gastroesophageal reflux disease), Headache, HLD (hyperlipidemia), Hypertension, NSTEMI (non-ST elevated myocardial infarction) ([place]) (10/28/2014), OSA (obstructive sleep apnea), and S/P aortic valve repair (09/15/2018).  Preventative health care Age and sex appropriate education and counseling updated with regular exercise and diet Referrals for preventative services - none needed Immunizations addressed - none needed Smoking counseling  - counseled to quit, pt not ready Evidence for depression or other mood disorder - none significant Most recent labs reviewed. I have personally reviewed and have noted: 1) the patient's medical and social history 2) The patient's current medications and supplements 3) The patient's height, weight, and BMI have been recorded in the chart   Pre-diabetes Lab Results  Component Value Date   HGBA1C 5.8 07/24/2020   Stable, pt to continue current medical treatment  - diet   Essential hypertension BP Readings from Last 3 Encounters:  07/25/20 120/76  07/17/20 140/86  06/26/20 137/90   Stable, pt to continue medical treatment  - lopressor   HLD (hyperlipidemia) Lab Results  Component Value Date   LDLCALC 51 07/24/2020   Stable, pt to continue current statin lipitor 80   Followup: Return in about 1 year (around 07/25/2021).  Cathlean Cower, MD 07/26/2020 12:28 AM [place]  [facility]

## 2020-07-26 ENCOUNTER — Encounter: Payer: Self-pay | Admitting: Internal Medicine

## 2020-07-26 NOTE — Assessment & Plan Note (Signed)
Lab Results  Component Value Date   HGBA1C 5.8 07/24/2020   Stable, pt to continue current medical treatment  - diet

## 2020-07-26 NOTE — Assessment & Plan Note (Signed)
BP Readings from Last 3 Encounters:  07/25/20 120/76  07/17/20 140/86  06/26/20 137/90   Stable, pt to continue medical treatment  - lopressor

## 2020-07-26 NOTE — Assessment & Plan Note (Signed)
Lab Results  Component Value Date   LDLCALC 51 07/24/2020   Stable, pt to continue current statin lipitor 80

## 2020-07-26 NOTE — Patient Instructions (Signed)
Please continue all other medications as before, and refills have been done if requested.  Please have the pharmacy call with any other refills you may need.  Please continue your efforts at being more active, low cholesterol diet, and weight control.  You are otherwise up to date with prevention measures today.  Please keep your appointments with your specialists as you may have planned  Please make an Appointment to return for your 1 year visit, or sooner if needed, with Lab testing by Appointment as well, to be done about 3-5 days before at the FIRST FLOOR Lab (so this is for TWO appointments - please see the scheduling desk as you leave)  Due to the ongoing Covid 19 pandemic, our lab now requires an appointment for any labs done at our office.  If you need labs done and do not have an appointment, please call our office ahead of time to schedule before presenting to the lab for your testing.   

## 2020-07-26 NOTE — Assessment & Plan Note (Signed)
Age and sex appropriate education and counseling updated with regular exercise and diet Referrals for preventative services - none needed Immunizations addressed - none needed Smoking counseling  - counseled to quit, pt not ready Evidence for depression or other mood disorder - none significant Most recent labs reviewed. I have personally reviewed and have noted: 1) the patient's medical and social history 2) The patient's current medications and supplements 3) The patient's height, weight, and BMI have been recorded in the chart  

## 2020-07-28 ENCOUNTER — Ambulatory Visit: Payer: 59 | Admitting: Internal Medicine

## 2020-07-29 DIAGNOSIS — G4733 Obstructive sleep apnea (adult) (pediatric): Secondary | ICD-10-CM | POA: Diagnosis not present

## 2020-09-09 ENCOUNTER — Encounter: Payer: Self-pay | Admitting: Cardiology

## 2020-09-09 ENCOUNTER — Ambulatory Visit (HOSPITAL_COMMUNITY): Payer: 59 | Attending: Internal Medicine

## 2020-09-09 ENCOUNTER — Other Ambulatory Visit: Payer: Self-pay

## 2020-09-09 DIAGNOSIS — I351 Nonrheumatic aortic (valve) insufficiency: Secondary | ICD-10-CM | POA: Diagnosis not present

## 2020-09-09 DIAGNOSIS — I77810 Thoracic aortic ectasia: Secondary | ICD-10-CM | POA: Insufficient documentation

## 2020-09-09 LAB — ECHOCARDIOGRAM COMPLETE
AR max vel: 1.32 cm2
AV Area VTI: 1.71 cm2
AV Area mean vel: 1.3 cm2
AV Mean grad: 8 mmHg
AV Peak grad: 15.7 mmHg
Ao pk vel: 1.98 m/s
Area-P 1/2: 3.21 cm2
P 1/2 time: 787 msec
S' Lateral: 3.7 cm

## 2020-09-09 NOTE — Progress Notes (Signed)
[name], male   DOB: 1965/06/25, 55 y.o.   MRN: [id number]  Echocardiogram 2D Echocardiogram has been performed.  Jennette Dubin 09/09/20

## 2020-09-17 ENCOUNTER — Other Ambulatory Visit: Payer: Self-pay

## 2020-09-17 ENCOUNTER — Encounter: Payer: Self-pay | Admitting: Thoracic Surgery (Cardiothoracic Vascular Surgery)

## 2020-09-17 ENCOUNTER — Telehealth (INDEPENDENT_AMBULATORY_CARE_PROVIDER_SITE_OTHER): Payer: 59 | Admitting: Thoracic Surgery (Cardiothoracic Vascular Surgery)

## 2020-09-17 DIAGNOSIS — Z9889 Other specified postprocedural states: Secondary | ICD-10-CM | POA: Diagnosis not present

## 2020-09-17 NOTE — Progress Notes (Signed)
[address]       [place] [address]             [phone]     CARDIOTHORACIC SURGERY TELEPHONE VIRTUAL OFFICE NOTE  Primary Cardiologist is [name], MD PCP is Biagio Borg, MD   HPI:  I spoke with Isaac Patel (DOB 1965/10/21 ) via telephone on 09/17/2020 at 12:17 PM and verified that I was speaking with the correct person using more than one form of identification.  We discussed the fact that I was contacting them from my office and they were located at home, as well as the reason(s) for conducting our visit virtually instead of in-person.  The patient expressed understanding the circumstances and agreed to proceed as described.   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  I spoke with the patient over the telephone today see how he is doing now that he is approximately 2 years out from his original surgery.  He reports that he has had no problems whatsoever.  He was recently down at the beach where he was quite active swimming and doing other activities with his family.  He reports no symptoms of exertional shortness of breath or chest discomfort.  He has no physical limitations whatsoever.  He recently underwent follow-up echocardiogram which revealed stable left ventricular function and mild residual aortic insufficiency.  Overall he is pleased with his outcome.   Current Outpatient Medications  Medication Sig Dispense Refill   aspirin EC 81 MG tablet Take 81 mg by mouth daily.     atorvastatin (LIPITOR) 80 MG tablet TAKE 1 TABLET (80 MG TOTAL) BY MOUTH DAILY AT 6 PM. 90 tablet 1   calcium carbonate (TUMS - DOSED  IN MG ELEMENTAL CALCIUM) 500 MG chewable tablet Chew 2 tablets by mouth daily as needed for indigestion or heartburn.     fluticasone (FLONASE) 50 MCG/ACT nasal spray Place 1 spray into both nostrils daily as needed for allergies or rhinitis.     gabapentin (NEURONTIN) 100 MG capsule TAKE 2 CAPSULES (200 MG TOTAL) BY MOUTH AT BEDTIME. 180 capsule 0   loratadine (CLARITIN) 10 MG tablet Take 10 mg by mouth daily.     metoprolol tartrate (LOPRESSOR) 25 MG tablet TAKE 1 TABLET (25 MG TOTAL) BY MOUTH 2 (TWO) TIMES DAILY. 180 tablet 1   Multiple Vitamin (MULTI-VITAMIN PO) Take 1 tablet by mouth daily.     nitroGLYCERIN (NITROSTAT) 0.4 MG SL tablet Place 1 tablet (0.4 mg total) under the tongue every 5 (five) minutes x 3 doses as needed for chest pain. 25 tablet 3   pantoprazole (PROTONIX) 40 MG tablet TAKE 1 TABLET (40 MG TOTAL) BY MOUTH DAILY. 90 tablet 1   No current facility-administered medications for this visit.     Diagnostic Tests:    ECHOCARDIOGRAM REPORT         Patient Name:   Isaac Patel Date of Exam: 09/09/2020  Medical Rec #:  [id number]        Height:       71.0 in  Accession #:    [id number]       Weight:       196.0 lb  Date of Birth:  [birth year]        BSA:          2.091 m  Patient Age:    15 years         BP:           120/76 mmHg  Patient Gender: M                HR:           63 bpm.  Exam Location:  [facility]   Procedure: 2D Echo   Indications:    Aortic Insufficiency     History:        Patient has prior history of Echocardiogram examinations,  most                  recent 09/10/2019. CHF, Previous Myocardial Infarction and  CAD;                  Risk Factors:Hypertension and Dyslipidemia.                  Aortic Valve Repair: 21 mm Annulplasty ring valve is  present in                  the aortic position. Procedure Date: 09/15/2018.     Sonographer:    Mikki Santee RDCS  Referring Phys: [place]     1. Left  ventricular ejection fraction, by estimation, is 50 to 55%. The  left ventricle has low normal function. The left ventricle has no regional  wall motion abnormalities. Left ventricular diastolic parameters are  consistent with Grade I diastolic  dysfunction (impaired relaxation).   2. Right ventricular systolic function is normal. The right ventricular  size is normal. There is normal pulmonary artery systolic pressure. The  estimated right ventricular systolic pressure is 74.9 mmHg.   3. Right atrial size was mild to moderately dilated.   4. The mitral valve is grossly normal. Trivial mitral valve  regurgitation.   5. The aortic valve has been repaired/replaced. Aortic valve  regurgitation is mild. There is a 21 mm Annulplasty ring valve present in  the aortic position. Procedure Date: 09/15/2018. Aortic valve mean gradient  measures 8.0 mmHg.   6. Aortic dilatation noted. There is borderline dilatation of the aortic  root, measuring 38 mm.   7. The inferior vena cava is normal in size with greater than 50%  respiratory variability, suggesting right atrial pressure of 3 mmHg.   Comparison(s): No significant change from prior study. 09/10/2019: LVEF  50-55%, AV repair - 21 mm annuloplasty ring, mild AI, mean gradient 8  mmHg.   FINDINGS   Left Ventricle: Left ventricular ejection fraction, by estimation, is 50  to 55%. The left ventricle has low normal function. The left ventricle has  no regional wall motion abnormalities. The left ventricular internal  cavity size was normal in size.  There is no left ventricular hypertrophy. Left ventricular diastolic  parameters are consistent with Grade I diastolic dysfunction (impaired  relaxation). Indeterminate filling pressures.   Right Ventricle: The right ventricular size is normal. No increase in  right ventricular wall thickness. Right ventricular systolic function is  normal. There is normal pulmonary artery systolic pressure. The  tricuspid  regurgitant velocity is 2.01 m/s, and   with an assumed right atrial pressure of 3 mmHg, the estimated right  ventricular systolic pressure is 44.9 mmHg.   Left Atrium:  Left atrial size was normal in size.   Right Atrium: Right atrial size was mild to moderately dilated.   Pericardium: There is no evidence of pericardial effusion.   Mitral Valve: The mitral valve is grossly normal. Trivial mitral valve  regurgitation.   Tricuspid Valve: The tricuspid valve is grossly normal. Tricuspid valve  regurgitation is trivial.   Aortic Valve: The aortic valve has been repaired/replaced. Aortic valve  regurgitation is mild. Aortic regurgitation PHT measures 787 msec. Aortic  valve mean gradient measures 8.0 mmHg. Aortic valve peak gradient measures  15.7 mmHg. Aortic valve area, by  VTI measures 1.71 cm. There is a 21 mm Annulplasty ring valve present in  the aortic position. Procedure Date: 09/15/2018.   Pulmonic Valve: The pulmonic valve was normal in structure. Pulmonic valve  regurgitation is not visualized.   Aorta: Aortic dilatation noted. There is borderline dilatation of the  aortic root, measuring 38 mm.   Venous: The inferior vena cava is normal in size with greater than 50%  respiratory variability, suggesting right atrial pressure of 3 mmHg.   IAS/Shunts: No atrial level shunt detected by color flow Doppler.      LEFT VENTRICLE  PLAX 2D  LVIDd:         5.10 cm  Diastology  LVIDs:         3.70 cm  LV e' medial:    5.87 cm/s  LV PW:         1.00 cm  LV E/e' medial:  12.4  LV IVS:        0.90 cm  LV e' lateral:   5.87 cm/s  LVOT diam:     2.40 cm  LV E/e' lateral: 12.4  LV SV:         69  LV SV Index:   33  LVOT Area:     4.52 cm      RIGHT VENTRICLE  RV S prime:     11.60 cm/s  TAPSE (M-mode): 2.0 cm   LEFT ATRIUM             Index       RIGHT ATRIUM           Index  LA diam:        3.70 cm 1.77 cm/m  RA Area:     25.70 cm  LA Vol (A2C):   37.6 ml  17.99 ml/m RA Volume:   79.00 ml  37.79 ml/m  LA Vol (A4C):   44.6 ml 21.33 ml/m  LA Biplane Vol: 41.5 ml 19.85 ml/m   AORTIC VALVE  AV Area (Vmax):    1.32 cm  AV Area (Vmean):   1.30 cm  AV Area (VTI):     1.71 cm  AV Vmax:           198.00 cm/s  AV Vmean:          128.000 cm/s  AV VTI:            0.405 m  AV Peak Grad:      15.7 mmHg  AV Mean Grad:      8.0 mmHg  LVOT Vmax:         57.90 cm/s  LVOT Vmean:        36.900 cm/s  LVOT VTI:          0.153 m  LVOT/AV VTI ratio: 0.38  AI PHT:            787 msec  AORTA  Ao Root diam: 3.80 cm  Ao Asc diam:  3.60 cm   MITRAL VALVE               TRICUSPID VALVE  MV Area (PHT): 3.21 cm    TR Peak grad:   16.2 mmHg  MV Decel Time: 236 msec    TR Vmax:        201.00 cm/s  MV E velocity: 72.80 cm/s  MV A velocity: 91.20 cm/s  SHUNTS  MV E/A ratio:  0.80        Systemic VTI:  0.15 m                             Systemic Diam: 2.40 cm   Lyman Bishop MD  Electronically signed by Lyman Bishop MD  Signature Date/Time: 09/09/2020/12:17:43 PM       Impression:  Patient is doing well and remains clinically stable approximately 2 years following aortic valve repair.  Recent follow-up echocardiogram demonstrates mild residual aortic insufficiency with stable left ventricular function.  Plan:  We have not recommended any changes to the patient's current medications.  I have reiterated to the patient that I would recommend annual follow-up echocardiograms which have already been planned by Dr. Radford Pax.  The patient will continue to follow-up with Dr. Radford Pax and call this office in the future only should specific problems or questions arise.  The patient has been reminded regarding the importance of dental hygiene and the lifelong need for antibiotic prophylaxis for all dental cleanings and other related invasive procedures.     I discussed limitations of evaluation and management via telephone.  The patient was advised to call back  for repeat telephone consultation or to seek an in-person evaluation if questions arise or the patient's clinical condition changes in any significant manner.  I spent in excess of 5 minutes of non-face-to-face time during the conduct of this telephone virtual office consultation, including pre-visit review of the patient's records and direct conversation with the patient.      Valentina Gu. Roxy Manns, MD 09/17/2020 12:17 PM

## 2020-09-17 NOTE — Patient Instructions (Signed)

## 2020-10-29 ENCOUNTER — Other Ambulatory Visit (HOSPITAL_BASED_OUTPATIENT_CLINIC_OR_DEPARTMENT_OTHER): Payer: Self-pay

## 2020-11-06 DIAGNOSIS — Z89212 Acquired absence of left upper limb below elbow: Secondary | ICD-10-CM | POA: Diagnosis not present

## 2020-11-10 ENCOUNTER — Other Ambulatory Visit: Payer: Self-pay | Admitting: Cardiology

## 2020-11-11 ENCOUNTER — Other Ambulatory Visit (HOSPITAL_BASED_OUTPATIENT_CLINIC_OR_DEPARTMENT_OTHER): Payer: Self-pay

## 2020-11-11 MED ORDER — ATORVASTATIN CALCIUM 80 MG PO TABS
ORAL_TABLET | Freq: Every day | ORAL | 0 refills | Status: DC
  Filled 2020-11-11: qty 30, 30d supply, fill #0

## 2020-11-11 MED ORDER — METOPROLOL TARTRATE 25 MG PO TABS
ORAL_TABLET | Freq: Two times a day (BID) | ORAL | 0 refills | Status: DC
  Filled 2020-11-11: qty 60, 30d supply, fill #0

## 2020-12-20 ENCOUNTER — Other Ambulatory Visit: Payer: Self-pay | Admitting: Cardiology

## 2020-12-22 ENCOUNTER — Other Ambulatory Visit (HOSPITAL_BASED_OUTPATIENT_CLINIC_OR_DEPARTMENT_OTHER): Payer: Self-pay

## 2020-12-22 MED ORDER — METOPROLOL TARTRATE 25 MG PO TABS
25.0000 mg | ORAL_TABLET | Freq: Two times a day (BID) | ORAL | 0 refills | Status: DC
  Filled 2020-12-22: qty 180, 90d supply, fill #0

## 2021-01-12 ENCOUNTER — Other Ambulatory Visit: Payer: Self-pay | Admitting: Internal Medicine

## 2021-01-12 ENCOUNTER — Other Ambulatory Visit (HOSPITAL_BASED_OUTPATIENT_CLINIC_OR_DEPARTMENT_OTHER): Payer: Self-pay

## 2021-01-12 ENCOUNTER — Other Ambulatory Visit: Payer: Self-pay | Admitting: Cardiology

## 2021-01-12 MED ORDER — ATORVASTATIN CALCIUM 80 MG PO TABS
80.0000 mg | ORAL_TABLET | Freq: Every day | ORAL | 0 refills | Status: DC
  Filled 2021-01-12: qty 30, 30d supply, fill #0

## 2021-01-12 MED ORDER — PANTOPRAZOLE SODIUM 40 MG PO TBEC
DELAYED_RELEASE_TABLET | Freq: Every day | ORAL | 0 refills | Status: DC
  Filled 2021-01-12: qty 90, 90d supply, fill #0

## 2021-01-12 NOTE — Telephone Encounter (Signed)
Please refill as per office routine med refill policy (all routine meds to be refilled for 3 mo or monthly (per pt preference) up to one year from last visit, then month to month grace period for 3 mo, then further med refills will have to be denied) ? ?

## 2021-01-29 DIAGNOSIS — G4733 Obstructive sleep apnea (adult) (pediatric): Secondary | ICD-10-CM | POA: Diagnosis not present

## 2021-02-25 ENCOUNTER — Telehealth: Payer: Self-pay | Admitting: Cardiology

## 2021-02-25 ENCOUNTER — Other Ambulatory Visit (HOSPITAL_BASED_OUTPATIENT_CLINIC_OR_DEPARTMENT_OTHER): Payer: Self-pay

## 2021-02-25 MED ORDER — ATORVASTATIN CALCIUM 80 MG PO TABS
80.0000 mg | ORAL_TABLET | Freq: Every day | ORAL | 1 refills | Status: DC
  Filled 2021-02-25: qty 30, 30d supply, fill #0
  Filled 2021-03-31: qty 30, 30d supply, fill #1

## 2021-02-25 MED ORDER — METOPROLOL TARTRATE 25 MG PO TABS
25.0000 mg | ORAL_TABLET | Freq: Two times a day (BID) | ORAL | 1 refills | Status: DC
  Filled 2021-02-25 – 2021-03-31 (×2): qty 60, 30d supply, fill #0

## 2021-02-25 NOTE — Telephone Encounter (Signed)
Pt's medications were sent to pt's pharmacy as requested. Confirmation received.  

## 2021-02-25 NOTE — Telephone Encounter (Signed)
*  STAT* If patient is at the pharmacy, call can be transferred to refill team.   1. Which medications need to be refilled? (please list name of each medication and dose if known) atorvastatin (LIPITOR) 80 MG tablet/metoprolol tartrate (LOPRESSOR) 25 MG tablet  2. Which pharmacy/location (including street and city if local pharmacy) is medication to be sent to? [place] [facility]  3. Do they need a 30 day or 90 day supply? 30   Pt needs refills until his January appt with Dr. Radford Pax

## 2021-03-02 ENCOUNTER — Ambulatory Visit: Payer: 59 | Admitting: Cardiology

## 2021-03-09 ENCOUNTER — Ambulatory Visit: Payer: 59 | Admitting: Internal Medicine

## 2021-04-01 ENCOUNTER — Other Ambulatory Visit (HOSPITAL_BASED_OUTPATIENT_CLINIC_OR_DEPARTMENT_OTHER): Payer: Self-pay

## 2021-04-13 ENCOUNTER — Other Ambulatory Visit (HOSPITAL_BASED_OUTPATIENT_CLINIC_OR_DEPARTMENT_OTHER): Payer: Self-pay

## 2021-04-13 ENCOUNTER — Telehealth (INDEPENDENT_AMBULATORY_CARE_PROVIDER_SITE_OTHER): Payer: 59 | Admitting: Cardiology

## 2021-04-13 ENCOUNTER — Other Ambulatory Visit: Payer: Self-pay

## 2021-04-13 ENCOUNTER — Encounter: Payer: Self-pay | Admitting: Cardiology

## 2021-04-13 VITALS — BP 120/70 | HR 74 | Ht 71.0 in | Wt 195.0 lb

## 2021-04-13 DIAGNOSIS — I10 Essential (primary) hypertension: Secondary | ICD-10-CM | POA: Diagnosis not present

## 2021-04-13 DIAGNOSIS — E7800 Pure hypercholesterolemia, unspecified: Secondary | ICD-10-CM | POA: Diagnosis not present

## 2021-04-13 DIAGNOSIS — I77810 Thoracic aortic ectasia: Secondary | ICD-10-CM

## 2021-04-13 DIAGNOSIS — I2510 Atherosclerotic heart disease of native coronary artery without angina pectoris: Secondary | ICD-10-CM | POA: Diagnosis not present

## 2021-04-13 DIAGNOSIS — I351 Nonrheumatic aortic (valve) insufficiency: Secondary | ICD-10-CM | POA: Diagnosis not present

## 2021-04-13 MED ORDER — METOPROLOL TARTRATE 25 MG PO TABS
25.0000 mg | ORAL_TABLET | Freq: Two times a day (BID) | ORAL | 3 refills | Status: DC
  Filled 2021-04-13 – 2021-05-18 (×3): qty 180, 90d supply, fill #0
  Filled 2021-08-17: qty 180, 90d supply, fill #1
  Filled 2021-11-17: qty 180, 90d supply, fill #2
  Filled 2022-02-14: qty 180, 90d supply, fill #3

## 2021-04-13 MED ORDER — ATORVASTATIN CALCIUM 80 MG PO TABS
80.0000 mg | ORAL_TABLET | Freq: Every day | ORAL | 3 refills | Status: DC
  Filled 2021-04-13 – 2021-04-24 (×3): qty 90, 90d supply, fill #0
  Filled 2021-08-17: qty 90, 90d supply, fill #1
  Filled 2021-11-17: qty 90, 90d supply, fill #2
  Filled 2022-02-14: qty 90, 90d supply, fill #3

## 2021-04-13 NOTE — Patient Instructions (Signed)
Medication Instructions:  Refills sent to pharmacy for Atorvastatin and Metoprolol Tartrate.  Your physician recommends that you continue on your current medications as directed. Please refer to the Current Medication list given to you today.  *If you need a refill on your cardiac medications before your next appointment, please call your pharmacy*   Lab Work: Lipids and ALT scheduled for 2/9  If you have labs (blood work) drawn today and your tests are completely normal, you will receive your results only by: [place] (if you have MyChart) OR A paper copy in the mail If you have any lab test that is abnormal or we need to change your treatment, we will call you to review the results.   Testing/Procedures: Echo ordered for June 2023. (Someone will call to schedule)   Follow-Up: At [facility], you and your health needs are our priority.  As part of our continuing mission to provide you with exceptional heart care, we have created designated Provider Care Teams.  These Care Teams include your primary Cardiologist (physician) and Advanced Practice Providers (APPs -  Physician Assistants and Nurse Practitioners) who all work together to provide you with the care you need, when you need it.   Your next appointment:   1 year(s)  The format for your next appointment:   In Person  Provider:   [name], MD     Other Instructions Your physician has requested that you have an echocardiogram. Echocardiography is a painless test that uses sound waves to create images of your heart. It provides your doctor with information about the size and shape of your heart and how well your hearts chambers and valves are working. This procedure takes approximately one hour. There are no restrictions for this procedure.

## 2021-04-13 NOTE — Progress Notes (Signed)
Virtual Visit via Video Note   This visit type was conducted due to national recommendations for restrictions regarding the COVID-19 Pandemic (e.g. social distancing) in an effort to limit this patient's exposure and mitigate transmission in our community.  Due to his co-morbid illnesses, this patient is at least at moderate risk for complications without adequate follow up.  This format is felt to be most appropriate for this patient at this time.  All issues noted in this document were discussed and addressed.  A limited physical exam was performed with this format.  Please refer to the patient's chart for his consent to telehealth for [facility].   Date:  04/13/2021   ID:  Isaac Patel, DOB 1965-04-13, MRN [id number] The patient was identified using 2 identifiers.  Patient Location: Home Provider Location: Home Office   PCP:  Biagio Borg, MD   [facility] Providers Cardiologist:  [name], MD     Evaluation Performed:  Follow-Up Visit  Chief Complaint:  CAD  History of Present Illness:    Isaac Patel is a 56 y.o. male with  CAD s/p NSTEMI in 8/16 tx with DES to the proximal LAD.  Cardiac Catheterization in 04/2016 demonstrated a patent LAD stent. He was admitted 3/10-3/02/2019 with chest and abdominal pain.  He ruled out for ACS.  He was noted to have murmur on exam and an echocardiogram demonstrated moderate to severe aortic insufficiency.  Transesophageal echocardiogram confirmed severe aortic insufficiency.  His LV was dilated and he was felt to meet criteria for aortic valve replacement.     He was seen by Richardson Dopp, PA 06/16/2018 and was complaining of exertional fatigue with minimal SOB but no exertional CP.  He had had a URI a few weeks prior to that.  He was seen back again 08/04/2018 in televisit with complaints of SOB and chest pain along with abdominal fullness, orthopnea but no PND.  He also was complaining of LE edema.  He underwent Cath 08/11/2018  showing patent stent in the oLAD-pLAD and 10% pLCx with normal LV filling pressures.  He underwent AVR repair with HAART aortic ring annuloplasty 44mm in June 2020 and had followup last fall with Richardson Dopp, PA and was doing well. Followup echo 10/2018 showed low normal LVF with mild AI and aortic root 53mm.    He is here today for followup and is doing well.  He denies any chest pain or pressure, SOB, DOE, PND, orthopnea, LE edema, dizziness, palpitations or syncope.  He is compliant with his meds and is tolerating meds with no SE.     The patient does not have symptoms concerning for COVID-19 infection (fever, chills, cough, or new shortness of breath).    Past Medical History:  Diagnosis Date   Aortic insufficiency    Status post aortic valve repair // Echo 8/20: EF 50-55, moderate LVH, grade 1 diastolic dysfunction, normal RV SF, status post AV repair, mild AI, mild dilation of aortic root (38 mm)   CAD (coronary artery disease)    a.  Non-STEMI 8/16: LHC-proximal LAD 80% treated with a resolute DES, EF 50-55%;  b.  Echo 8/16:  Moderate LVH, EF 55-60%, normal wall motion, normal diastolic function, normal RV function   CHF (congestive heart failure) ([place])    Dilated aortic root (HCC)    63mm by echo 08/2020   Dyspnea    mild   GERD (gastroesophageal reflux disease)    Headache    "maybe monthly" (10/28/2014)  HLD (hyperlipidemia)    Hypertension    NSTEMI (non-ST elevated myocardial infarction) ([place]) 10/28/2014   OSA (obstructive sleep apnea)    "tried mask; wear it off and on" (10/28/2014)   S/P aortic valve repair 09/15/2018   Complex valvuloplasty including plication of prolapsing right coronary cusp with Biostable HAART 300 aortic annuloplasty, size 21 mm   Past Surgical History:  Procedure Laterality Date   AORTIC VALVE REPAIR N/A 09/15/2018   Procedure: AORTIC VALVE REPAIR USING HAART 300 AORTIC ANNULOPLASTY DEVICE SIZE 21MM;  Surgeon: Rexene Alberts, MD;  Location: [facility];   Service: Open Heart Surgery;  Laterality: N/A;   ARM AMPUTATION THROUGH FOREARM Left 2009   traumatic injury   CARDIAC CATHETERIZATION N/A 10/29/2014   Procedure: Left Heart Cath and Coronary Angiography;  Surgeon: Troy Sine, MD;  Location: [place] CV LAB;  Service: Cardiovascular;  Laterality: N/A;   CHOLESTEATOMA EXCISION Right 1990's   LEFT HEART CATH AND CORONARY ANGIOGRAPHY N/A 05/25/2016   Procedure: Left Heart Cath and Coronary Angiography;  Surgeon: Sherren Mocha, MD;  Location: [place] [facility];  Service: Cardiovascular;  Laterality: N/A;   MULTIPLE TOOTH EXTRACTIONS  08/29/2018   NASAL SEPTOPLASTY W/ TURBINOPLASTY Bilateral 11/30/2017   Procedure: NASAL SEPTOPLASTY WITH TURBINATE REDUCTION;  Surgeon: Jerrell Belfast, MD;  Location: [place];  Service: ENT;  Laterality: Bilateral;   RIGHT/LEFT HEART CATH AND CORONARY ANGIOGRAPHY N/A 08/11/2018   Procedure: RIGHT/LEFT HEART CATH AND CORONARY ANGIOGRAPHY;  Surgeon: Burnell Blanks, MD;  Location: [place] CV [facility];  Service: Cardiovascular;  Laterality: N/A;   TEE WITHOUT CARDIOVERSION N/A 06/08/2018   Procedure: TRANSESOPHAGEAL ECHOCARDIOGRAM (TEE);  Surgeon: Buford Dresser, MD;  Location: [facility] ENDOSCOPY;  Service: Cardiovascular;  Laterality: N/A;   TEE WITHOUT CARDIOVERSION N/A 09/15/2018   Procedure: TRANSESOPHAGEAL ECHOCARDIOGRAM (TEE);  Surgeon: Rexene Alberts, MD;  Location: [place];  Service: Open Heart Surgery;  Laterality: N/A;   TYMPANOSTOMY TUBE PLACEMENT Bilateral "as a kid"   "had one done as an adult too"   WISDOM TOOTH EXTRACTION       Current Meds  Medication Sig   aspirin EC 81 MG tablet Take 81 mg by mouth daily.   atorvastatin (LIPITOR) 80 MG tablet Take 1 tablet (80 mg total) by mouth daily. Please keep upcoming appt in January 2023 with Dr. Radford Pax before anymore refills.   calcium carbonate (TUMS - DOSED IN MG ELEMENTAL CALCIUM) 500 MG chewable tablet Chew 2 tablets by mouth daily as needed for  indigestion or heartburn.   fluticasone (FLONASE) 50 MCG/ACT nasal spray Place 1 spray into both nostrils daily as needed for allergies or rhinitis.   loratadine (CLARITIN) 10 MG tablet Take 10 mg by mouth daily.   metoprolol tartrate (LOPRESSOR) 25 MG tablet Take 1 tablet (25 mg total) by mouth 2 (two) times daily. Please keep upcoming appt with Dr. Radford Pax in January 2023 before anymore refills.   Multiple Vitamin (MULTI-VITAMIN PO) Take 1 tablet by mouth daily.   nitroGLYCERIN (NITROSTAT) 0.4 MG SL tablet Place 1 tablet (0.4 mg total) under the tongue every 5 (five) minutes x 3 doses as needed for chest pain.   pantoprazole (PROTONIX) 40 MG tablet TAKE 1 TABLET (40 MG TOTAL) BY MOUTH DAILY.     Allergies:   Morphine and related   Social History   Tobacco Use   Smoking status: Every Day    Packs/day: 0.50    Years: 32.00    Pack years: 16.00  Types: Cigarettes    Last attempt to quit: 07/28/2018    Years since quitting: 2.7   Smokeless tobacco: Never   Tobacco comments:    6 cigarettes per day 08/28/19 [facility]   Vaping Use   Vaping Use: Former  Substance Use Topics   Alcohol use: Yes    Alcohol/week: 2.0 - 3.0 standard drinks    Types: 2 - 3 Standard drinks or equivalent per week    Comment: rare   Drug use: Not Currently    Types: Marijuana    Comment: 05/24/2016  "stopped marijuana in the early 2000's"     Family Hx: The patient's family history includes Bradycardia in his father; Diverticulitis in his mother; Healthy in his father; Multiple sclerosis in his maternal grandfather; Skin cancer in his mother. There is no history of Colon cancer, Stomach cancer, Rectal cancer, Liver cancer, or Esophageal cancer.  ROS:  Please see the history of present illness.     All other systems reviewed and are negative.   Prior CV studies:   The following studies were reviewed today:  none  Labs/Other Tests and Data Reviewed:    EKG:  No ECG reviewed.  Recent Labs: 07/24/2020: ALT  21; BUN 23; Creatinine, Ser 1.11; Hemoglobin 15.4; Platelets 227.0; Potassium 3.9; Sodium 142; TSH 1.11   Recent Lipid Panel Lab Results  Component Value Date/Time   CHOL 128 07/24/2020 04:48 PM   CHOL 105 10/29/2019 08:45 AM   TRIG 172.0 (H) 07/24/2020 04:48 PM   HDL 42.20 07/24/2020 04:48 PM   HDL 40 10/29/2019 08:45 AM   CHOLHDL 3 07/24/2020 04:48 PM   LDLCALC 51 07/24/2020 04:48 PM   LDLCALC 52 10/29/2019 08:45 AM    Wt Readings from Last 3 Encounters:  04/13/21 195 lb (88.5 kg)  07/25/20 196 lb (88.9 kg)  07/17/20 200 lb (90.7 kg)     Risk Assessment/Calculations:          Objective:    Vital Signs:  BP 120/70    Pulse 74    Ht 5\' 11"  (1.803 m)    Wt 195 lb (88.5 kg)    SpO2 97%    BMI 27.20 kg/m    VITAL SIGNS:  reviewed GEN:  no acute distress EYES:  sclerae anicteric, EOMI - Extraocular Movements Intact RESPIRATORY:  normal respiratory effort, symmetric expansion CARDIOVASCULAR:  no peripheral edema SKIN:  no rash, lesions or ulcers. MUSCULOSKELETAL:  no obvious deformities. NEURO:  alert and oriented x 3, no obvious focal deficit PSYCH:  normal affect  ASSESSMENT & PLAN:    1.  ASCAD -s/p NSTEMI in 8/16 tx with DES to the proximal LAD.  -s/p cath 07/2018 showed widely patent oLAD stent and 10% pLCX stenosis. -he has not had any anginal symptoms since I saw him last -Continue prescription management with aspirin 81 mg daily, Lopressor 25 mg daily and atorvastatin 80 mg daily with as needed refills    2.  Severe AI -s/p Complex valvuloplasty including plication of prolapsing right coronary cusp with Biostable HAART 300 aortic annuloplasty, size 21 mm -followup echo 09/09/2020 showed normal LV function with EF 50 to 55% with grade 1 diastolic dysfunction, mild to moderate RAE and stable 21 mm angioplasty ring in the aortic valve position with mean gradient 8 mmHg. -I again reminded him of the need for ongoing SBE prophylaxis with dental procedures and  cleanings -2D echo 09/09/2020 showed normal LV function with EF 50 to 55% with grade 1 diastolic dysfunction  and stable 21 mm annuloplasty ring in the aortic valve position with mean aortic valve gradient 8 mmHg and mild AI  3.  Hypertension -BP remains controlled -Continue prescription drug management Lopressor 25 mg twice daily with as needed refills   4.  OSA - This is followed by Pulmonary   5  Hyperlipidemia -LDL goal < 70 -Check FLP and ALT -Continue prescription drug management with atorvastatin 80 mg daily with as needed refills    6.  Dilated aortic root -2D echo 09/09/2020 showed aortic root measurement of 38 mm. -Repeat limited echo 08/2021 to make sure this remains stable      COVID-19 Education: The signs and symptoms of COVID-19 were discussed with the patient and how to seek care for testing (follow up with PCP or arrange E-visit).  The importance of social distancing was discussed today.  Time:   Today, I have spent 20 minutes with the patient with telehealth technology discussing the above problems.     Medication Adjustments/Labs and Tests Ordered: Current medicines are reviewed at length with the patient today.  Concerns regarding medicines are outlined above.   Tests Ordered: No orders of the defined types were placed in this encounter.   Medication Changes: No orders of the defined types were placed in this encounter.   Follow Up:  In Person in 1 year(s)  Signed, [name], MD  04/13/2021 10:43 AM    [place]

## 2021-04-16 ENCOUNTER — Other Ambulatory Visit (HOSPITAL_BASED_OUTPATIENT_CLINIC_OR_DEPARTMENT_OTHER): Payer: Self-pay

## 2021-04-16 ENCOUNTER — Other Ambulatory Visit: Payer: Self-pay | Admitting: Internal Medicine

## 2021-04-17 MED ORDER — PANTOPRAZOLE SODIUM 40 MG PO TBEC
DELAYED_RELEASE_TABLET | Freq: Every day | ORAL | 0 refills | Status: DC
  Filled 2021-04-24: qty 90, 90d supply, fill #0

## 2021-04-17 NOTE — Telephone Encounter (Signed)
Please refill as per office routine med refill policy (all routine meds to be refilled for 3 mo or monthly (per pt preference) up to one year from last visit, then month to month grace period for 3 mo, then further med refills will have to be denied) ? ?

## 2021-04-24 ENCOUNTER — Other Ambulatory Visit (HOSPITAL_BASED_OUTPATIENT_CLINIC_OR_DEPARTMENT_OTHER): Payer: Self-pay

## 2021-05-07 ENCOUNTER — Other Ambulatory Visit: Payer: Self-pay

## 2021-05-07 ENCOUNTER — Other Ambulatory Visit: Payer: 59

## 2021-05-07 DIAGNOSIS — I2510 Atherosclerotic heart disease of native coronary artery without angina pectoris: Secondary | ICD-10-CM | POA: Diagnosis not present

## 2021-05-07 DIAGNOSIS — E7800 Pure hypercholesterolemia, unspecified: Secondary | ICD-10-CM | POA: Diagnosis not present

## 2021-05-07 LAB — LIPID PANEL
Chol/HDL Ratio: 2.9 ratio (ref 0.0–5.0)
Cholesterol, Total: 112 mg/dL (ref 100–199)
HDL: 38 mg/dL — ABNORMAL LOW (ref >39)
LDL Chol Calc (NIH): 57 mg/dL (ref 0–99)
Triglycerides: 88 mg/dL (ref 0–149)
VLDL Cholesterol Cal: 17 mg/dL (ref 5–40)

## 2021-05-07 LAB — ALT: ALT: 17 IU/L (ref 0–44)

## 2021-05-18 ENCOUNTER — Other Ambulatory Visit (HOSPITAL_BASED_OUTPATIENT_CLINIC_OR_DEPARTMENT_OTHER): Payer: Self-pay

## 2021-06-04 ENCOUNTER — Encounter: Payer: Self-pay | Admitting: Internal Medicine

## 2021-06-04 ENCOUNTER — Other Ambulatory Visit: Payer: Self-pay

## 2021-06-04 ENCOUNTER — Ambulatory Visit: Payer: 59 | Admitting: Internal Medicine

## 2021-06-04 VITALS — BP 120/68 | HR 58 | Temp 98.6°F | Ht 71.0 in | Wt 201.0 lb

## 2021-06-04 DIAGNOSIS — Z720 Tobacco use: Secondary | ICD-10-CM

## 2021-06-04 DIAGNOSIS — J309 Allergic rhinitis, unspecified: Secondary | ICD-10-CM | POA: Insufficient documentation

## 2021-06-04 DIAGNOSIS — E559 Vitamin D deficiency, unspecified: Secondary | ICD-10-CM

## 2021-06-04 DIAGNOSIS — R7303 Prediabetes: Secondary | ICD-10-CM | POA: Diagnosis not present

## 2021-06-04 DIAGNOSIS — R42 Dizziness and giddiness: Secondary | ICD-10-CM

## 2021-06-04 DIAGNOSIS — H6983 Other specified disorders of Eustachian tube, bilateral: Secondary | ICD-10-CM | POA: Diagnosis not present

## 2021-06-04 DIAGNOSIS — E7800 Pure hypercholesterolemia, unspecified: Secondary | ICD-10-CM

## 2021-06-04 DIAGNOSIS — Z0001 Encounter for general adult medical examination with abnormal findings: Secondary | ICD-10-CM | POA: Diagnosis not present

## 2021-06-04 DIAGNOSIS — E538 Deficiency of other specified B group vitamins: Secondary | ICD-10-CM | POA: Diagnosis not present

## 2021-06-04 DIAGNOSIS — I10 Essential (primary) hypertension: Secondary | ICD-10-CM | POA: Diagnosis not present

## 2021-06-04 LAB — HEPATIC FUNCTION PANEL
ALT: 19 U/L (ref 0–53)
AST: 21 U/L (ref 0–37)
Albumin: 4.5 g/dL (ref 3.5–5.2)
Alkaline Phosphatase: 77 U/L (ref 39–117)
Bilirubin, Direct: 0.2 mg/dL (ref 0.0–0.3)
Total Bilirubin: 1 mg/dL (ref 0.2–1.2)
Total Protein: 6.6 g/dL (ref 6.0–8.3)

## 2021-06-04 LAB — CBC WITH DIFFERENTIAL/PLATELET
Basophils Absolute: 0 10*3/uL (ref 0.0–0.1)
Basophils Relative: 0.7 % (ref 0.0–3.0)
Eosinophils Absolute: 0.2 10*3/uL (ref 0.0–0.7)
Eosinophils Relative: 3 % (ref 0.0–5.0)
HCT: 44.7 % (ref 39.0–52.0)
Hemoglobin: 15.4 g/dL (ref 13.0–17.0)
Lymphocytes Relative: 18.6 % (ref 12.0–46.0)
Lymphs Abs: 1.3 10*3/uL (ref 0.7–4.0)
MCHC: 34.5 g/dL (ref 30.0–36.0)
MCV: 97 fl (ref 78.0–100.0)
Monocytes Absolute: 0.5 10*3/uL (ref 0.1–1.0)
Monocytes Relative: 6.9 % (ref 3.0–12.0)
Neutro Abs: 4.8 10*3/uL (ref 1.4–7.7)
Neutrophils Relative %: 70.8 % (ref 43.0–77.0)
Platelets: 214 10*3/uL (ref 150.0–400.0)
RBC: 4.61 Mil/uL (ref 4.22–5.81)
RDW: 13 % (ref 11.5–15.5)
WBC: 6.8 10*3/uL (ref 4.0–10.5)

## 2021-06-04 LAB — URINALYSIS, ROUTINE W REFLEX MICROSCOPIC
Bilirubin Urine: NEGATIVE
Ketones, ur: NEGATIVE
Leukocytes,Ua: NEGATIVE
Nitrite: NEGATIVE
Specific Gravity, Urine: 1.01 (ref 1.000–1.030)
Total Protein, Urine: NEGATIVE
Urine Glucose: NEGATIVE
Urobilinogen, UA: 0.2 (ref 0.0–1.0)
WBC, UA: NONE SEEN (ref 0–?)
pH: 7 (ref 5.0–8.0)

## 2021-06-04 LAB — LIPID PANEL
Cholesterol: 134 mg/dL (ref 0–200)
HDL: 47.8 mg/dL (ref >39.00)
LDL Cholesterol: 73 mg/dL (ref 0–99)
NonHDL: 85.83
Total CHOL/HDL Ratio: 3
Triglycerides: 64 mg/dL (ref 0.0–149.0)
VLDL: 12.8 mg/dL (ref 0.0–40.0)

## 2021-06-04 LAB — BASIC METABOLIC PANEL
BUN: 13 mg/dL (ref 6–23)
CO2: 29 mEq/L (ref 19–32)
Calcium: 9.3 mg/dL (ref 8.4–10.5)
Chloride: 103 mEq/L (ref 96–112)
Creatinine, Ser: 1.04 mg/dL (ref 0.40–1.50)
GFR: 80.28 mL/min (ref >60.00)
Glucose, Bld: 86 mg/dL (ref 70–99)
Potassium: 3.8 mEq/L (ref 3.5–5.1)
Sodium: 140 mEq/L (ref 135–145)

## 2021-06-04 LAB — VITAMIN D 25 HYDROXY (VIT D DEFICIENCY, FRACTURES): VITD: 32.66 ng/mL (ref 30.00–100.00)

## 2021-06-04 LAB — TSH: TSH: 1.22 u[IU]/mL (ref 0.35–5.50)

## 2021-06-04 LAB — PSA: PSA: 0.84 ng/mL (ref 0.10–4.00)

## 2021-06-04 LAB — VITAMIN B12: Vitamin B-12: 303 pg/mL (ref 211–911)

## 2021-06-04 LAB — HEMOGLOBIN A1C: Hgb A1c MFr Bld: 5.9 % (ref 4.6–6.5)

## 2021-06-04 MED ORDER — PREDNISONE 10 MG PO TABS: ORAL_TABLET | ORAL | 0 refills | Status: DC

## 2021-06-04 MED ORDER — TRIAMCINOLONE ACETONIDE 55 MCG/ACT NA AERO: 2.0000 | INHALATION_SPRAY | Freq: Every day | NASAL | 12 refills | Status: DC

## 2021-06-04 MED ORDER — GUAIFENESIN ER 600 MG PO TB12
1200.0000 mg | ORAL_TABLET | Freq: Two times a day (BID) | ORAL | 1 refills | Status: DC | PRN
Start: 2021-06-04 — End: 2022-05-31

## 2021-06-04 MED ORDER — MECLIZINE HCL 12.5 MG PO TABS: 12.5000 mg | ORAL_TABLET | Freq: Three times a day (TID) | ORAL | 2 refills | Status: DC | PRN

## 2021-06-04 MED ORDER — METHYLPREDNISOLONE ACETATE 40 MG/ML IJ SUSP
80.0000 mg | Freq: Once | INTRAMUSCULAR | Status: AC
  Administered 2021-06-04: 14:00:00 80 mg via INTRAMUSCULAR

## 2021-06-04 MED ORDER — CETIRIZINE HCL 10 MG PO TABS: 10.0000 mg | ORAL_TABLET | Freq: Every day | ORAL | 11 refills | Status: DC

## 2021-06-04 NOTE — Patient Instructions (Signed)
You had the steroid shot today ? ?Please take all new medication as prescribed  - the prednisone, antivert as needed for dizziness, zyrtec and nasacort for allergies ? ?Please continue all other medications as before, and refills have been done if requested. ? ?Please have the pharmacy call with any other refills you may need. ? ?Please continue your efforts at being more active, low cholesterol diet, and weight control. ? ?You are otherwise up to date with prevention measures today. ? ?Please keep your appointments with your specialists as you may have planned ? ?Please go to the LAB at the blood drawing area for the tests to be done ? ?You will be contacted by phone if any changes need to be made immediately.  Otherwise, you will receive a letter about your results with an explanation, but please check with MyChart first. ? ?Please remember to sign up for MyChart if you have not done so, as this will be important to you in the future with finding out test results, communicating by private email, and scheduling acute appointments online when needed. ? ?Please make an Appointment to return for your 1 year visit, or sooner if needed ?

## 2021-06-04 NOTE — Progress Notes (Signed)
[name], male   DOB: 28-Jan-1966, 56 y.o.   MRN: [id number]         Chief Complaint:: wellness exam and Office Visit (Bilateral ear fullness, dizziness)         HPI:  Isaac Patel is a 56 y.o. male here for wellness exam; declinews covid booster, o/w up to date                        Also Does have several wks ongoing nasal allergy symptoms with clearish congestion, itch and sneezing, without fever, pain, ST, cough, swelling or wheezing, but does have bilateral ear fullness and muffled hearing with vertigo mld intermittent as well.  Pt denies chest pain, increased sob or doe, orthopnea, PND, increased LE swelling, palpitations, dizziness or syncope.   Pt denies polydipsia, polyuria, or new focal neuro s/s.   Marland Kitchen Pt denies fever, wt loss, night sweats, loss of appetite, or other constitutional symptoms    Wt Readings from Last 3 Encounters:  06/04/21 201 lb (91.2 kg)  04/13/21 195 lb (88.5 kg)  07/25/20 196 lb (88.9 kg)   BP Readings from Last 3 Encounters:  06/04/21 120/68  04/13/21 120/70  07/25/20 120/76   Immunization History  Administered Date(s) Administered   Influenza,inj,Quad PF,6+ Mos 12/27/2017   Influenza-Unspecified 01/06/2015   PFIZER(Purple Top)SARS-COV-2 Vaccination 05/13/2019, 05/30/2019   Tdap 09/21/2017   Zoster Recombinat (Shingrix) 12/06/2019, 04/30/2020  There are no preventive care reminders to display for this patient.    Past Medical History:  Diagnosis Date   Aortic insufficiency    Status post aortic valve repair // Echo 8/20: EF 50-55, moderate LVH, grade 1 diastolic dysfunction, normal RV SF, status post AV repair, mild AI, mild dilation of aortic root (38 mm)   CAD (coronary artery disease)    a.  Non-STEMI 8/16: LHC-proximal LAD 80% treated with a resolute DES, EF 50-55%;  b.  Echo 8/16:  Moderate LVH, EF 55-60%, normal wall motion, normal diastolic function, normal RV function   CHF (congestive heart failure) ([place])    Dilated  aortic root (HCC)    3m by echo 08/2020   Dyspnea    mild   GERD (gastroesophageal reflux disease)    Headache    "maybe monthly" (10/28/2014)   HLD (hyperlipidemia)    Hypertension    [place]STEM[place]non-ST elevated [place] 10/28[place]2014   OSA (obstructive sleep apnea)    "tried mask; wear it off and on" (10/28/2014)   S/P aortic valve repair 09/15/2018   Complex valvuloplasty including plication of prolapsing right coronary cusp with Biostable HAART 300 aortic annuloplasty, size 21 mm   Past Surgical History:  Procedure Laterality Date   AORTIC VALVE REPAIR N/A 09/15/2018   Procedure: AORTIC VALVE REPAIR USING HAART 300 AORTIC ANNULOPLASTY DEVICE SIZE 21MM;  Surgeon: ORexene Alberts MD;  [facility]  Service: Open Heart Surgery;  Laterality: N/A;   ARM AMPUTATION THROUGH FOREARM Left 2009   traumatic injury   CARDIAC CATHETERIZATION N/A 10/29/2014   Procedure: Left Heart Cath and Coronary Angiography;  Surgeon: TTroy Sine MD;  [facility];  Service: Cardiovascular;  Laterality: N/A;   CHOLESTEATOMA EXCISION Right 1990's   LEFT HEART CATH AND CORONARY ANGIOGRAPHY N/A 05/25/2016   Procedure: Left Heart Cath and Coronary Angiography;  Surgeon: MSherren Mocha MD;  [facility];  Service: Cardiovascular;  Laterality: N/A;   MULTIPLE TOOTH EXTRACTIONS  08/29/2018  NASAL SEPTOPLASTY W/ TURBINOPLASTY Bilateral 11/30/2017   Procedure: NASAL SEPTOPLASTY WITH TURBINATE REDUCTION;  Surgeon: Jerrell Belfast, MD;  Location: [facility];  Service: ENT;  Laterality: Bilateral;   RIGHT/LEFT HEART CATH AND CORONARY ANGIOGRAPHY N/A 08/11/2018   Procedure: RIGHT/LEFT HEART CATH AND CORONARY ANGIOGRAPHY;  Surgeon: Burnell Blanks, MD;  Location: [facility];  Service: Cardiovascular;  Laterality: N/A;   TEE WITHOUT CARDIOVERSION N/A 06/08/2018   Procedure: TRANSESOPHAGEAL ECHOCARDIOGRAM (TEE);  Surgeon: Buford Dresser, MD;  [facility]  ENDOSCOPY;  Service: Cardiovascular;  Laterality: N/A;   TEE WITHOUT CARDIOVERSION N/A 09/15/2018   Procedure: TRANSESOPHAGEAL ECHOCARDIOGRAM (TEE);  Surgeon: Rexene Alberts, MD;  Location: [place];  Service: Open Heart Surgery;  Laterality: N/A;   TYMPANOSTOMY TUBE PLACEMENT Bilateral "as a kid"   "had one done as an adult too"   WISDOM TOOTH EXTRACTION      reports that he has been smoking cigarettes. He has a 16.00 pack-year smoking history. He has never used smokeless tobacco. He reports current alcohol use of about 2.0 - 3.0 standard drinks per week. He reports that he does not currently use drugs after having used the following drugs: Marijuana. family history includes Bradycardia in his father; Diverticulitis in his mother; Healthy in his father; Multiple sclerosis in his maternal grandfather; Skin cancer in his mother. Allergies  Allergen Reactions   Morphine And Related Itching   Current Outpatient Medications on File Prior to Visit  Medication Sig Dispense Refill   aspirin EC 81 MG tablet Take 81 mg by mouth daily.     atorvastatin (LIPITOR) 80 MG tablet Take 1 tablet (80 mg total) by mouth daily. 90 tablet 3   calcium carbonate (TUMS - DOSED IN MG ELEMENTAL CALCIUM) 500 MG chewable tablet Chew 2 tablets by mouth daily as needed for indigestion or heartburn.     fluticasone (FLONASE) 50 MCG/ACT nasal spray Place 1 spray into both nostrils daily as needed for allergies or rhinitis.     loratadine (CLARITIN) 10 MG tablet Take 10 mg by mouth daily.     metoprolol tartrate (LOPRESSOR) 25 MG tablet Take 1 tablet (25 mg total) by mouth 2 (two) times daily. 180 tablet 3   Multiple Vitamin (MULTI-VITAMIN PO) Take 1 tablet by mouth daily.     nitroGLYCERIN (NITROSTAT) 0.4 MG SL tablet Place 1 tablet (0.4 mg total) under the tongue every 5 (five) minutes x 3 doses as needed for chest pain. 25 tablet 3   pantoprazole (PROTONIX) 40 MG tablet TAKE 1 TABLET (40 MG TOTAL) BY MOUTH DAILY. 90 tablet 0    gabapentin (NEURONTIN) 100 MG capsule TAKE 2 CAPSULES (200 MG TOTAL) BY MOUTH AT BEDTIME. 180 capsule 0   No current facility-administered medications on file prior to visit.        ROS:  All others reviewed and negative.  Objective        PE:  BP 120/68 (BP Location: Right Arm, Patient Position: Sitting, Cuff Size: Large)    Pulse (!) 58    Temp 98.6 F (37 C) (Oral)    Ht '5\' 11"'$  (1.803 m)    Wt 201 lb (91.2 kg)    SpO2 94%    BMI 28.03 kg/m                 Constitutional: Pt appears in NAD               HENT: Head: NCAT.  Right Ear: External ear normal.                 Left Ear: External ear normal. Bilat tm's with mild erythema.  Max sinus areas non tender.  Pharynx with mild erythema, no exudate               Eyes: . Pupils are equal, round, and reactive to light. Conjunctivae and EOM are normal               Nose: without d/c or deformity               Neck: Neck supple. Gross normal ROM               Cardiovascular: Normal rate and regular rhythm.                 Pulmonary/Chest: Effort normal and breath sounds without rales or wheezing.                Abd:  Soft, NT, ND, + BS, no organomegaly               Neurological: Pt is alert. At baseline orientation, motor grossly intact               Skin: Skin is warm. No rashes, no other new lesions, LE edema - none               Psychiatric: Pt behavior is normal without agitation   Micro: none  Cardiac tracings I have personally interpreted today:  none  Pertinent Radiological findings (summarize): none   Lab Results  Component Value Date   WBC 6.8 06/04/2021   HGB 15.4 06/04/2021   HCT 44.7 06/04/2021   PLT 214.0 06/04/2021   GLUCOSE 86 06/04/2021   CHOL 134 06/04/2021   TRIG 64.0 06/04/2021   HDL 47.80 06/04/2021   LDLCALC 73 06/04/2021   ALT 19 06/04/2021   AST 21 06/04/2021   NA 140 06/04/2021   K 3.8 06/04/2021   CL 103 06/04/2021   CREATININE 1.04 06/04/2021   BUN 13 06/04/2021   CO2 29  06/04/2021   TSH 1.22 06/04/2021   PSA 0.84 06/04/2021   INR 1.4 (H) 09/15/2018   HGBA1C 5.9 06/04/2021   Assessment/Plan:  Isaac Patel is a 56 y.o. White or Caucasian [1] male with  has a past medical history of Aortic insufficiency, CAD (coronary artery disease), CHF (congestive heart failure) ([place]), Dilated aortic root ([place]), Dyspnea, GERD (gastroesophageal reflux disease), Headache, HLD (hyperlipidemia), Hypertension, NSTEMI (non-ST elevated myocardial infarction) ([place]) (10/28/2014), OSA (obstructive sleep apnea), and S/P aortic valve repair (09/15/2018).  Encounter for well adult exam with abnormal findings Age and sex appropriate education and counseling updated with regular exercise and diet Referrals for preventative services - none needed Immunizations addressed - declines covid booster Smoking counseling  - counseled to quit Evidence for depression or other mood disorder - none significant Most recent labs reviewed. I have personally reviewed and have noted: 1) the patient's medical and social history 2) The patient's current medications and supplements 3) The patient's height, weight, and BMI have been recorded in the chart   Vertigo Mild intermittent likely peripheral - for meclizine prn  Pre-diabetes Lab Results  Component Value Date   HGBA1C 5.9 06/04/2021   Stable, pt to continue current medical treatment  - diet   HLD (hyperlipidemia) Lab Results  Component Value Date   LDLCALC 73 06/04/2021   Mild uncontrolled, goal  ldl < 70 pt to continue current statin ipitor 80 and lower chol diet   Essential hypertension BP Readings from Last 3 Encounters:  06/04/21 120/68  04/13/21 120/70  07/25/20 120/76   Stable, pt to continue medical treatment lopresor   Tobacco abuse counsled to quit pt not ready  Allergic rhinitis Mild to mod, for depomedrol 80 mg IM, prednisone asd, zyrtec and nasacort asd, to f/u any worsening symptoms or concerns, declines  allergy referral for now  Acute dysfunction of Eustachian tube, bilateral Also for mucinex bid prn,  to f/u any worsening symptoms or concerns  Followup: Return in about 1 year (around 06/05/2022).  Cathlean Cower, MD 06/05/2021 4:37 AM [facility]

## 2021-06-05 ENCOUNTER — Encounter: Payer: Self-pay | Admitting: Internal Medicine

## 2021-06-05 NOTE — Assessment & Plan Note (Signed)
Lab Results  ?Component Value Date  ? HGBA1C 5.9 06/04/2021  ? ?Stable, pt to continue current medical treatment  - diet ? ?

## 2021-06-05 NOTE — Assessment & Plan Note (Signed)
counsled to quit pt not ready ?

## 2021-06-05 NOTE — Assessment & Plan Note (Signed)
Mild intermittent likely peripheral - for meclizine prn ?

## 2021-06-05 NOTE — Assessment & Plan Note (Addendum)
Age and sex appropriate education and counseling updated with regular exercise and diet ?Referrals for preventative services - none needed ?Immunizations addressed - declines covid booster ?Smoking counseling  - counseled to quit ?Evidence for depression or other mood disorder - none significant ?Most recent labs reviewed. ?I have personally reviewed and have noted: ?1) the patient's medical and social history ?2) The patient's current medications and supplements ?3) The patient's height, weight, and BMI have been recorded in the chart ? ?

## 2021-06-05 NOTE — Assessment & Plan Note (Signed)
Lab Results  ?Component Value Date  ? [place] 73 06/04/2021  ? ?Mild uncontrolled, goal ldl < 70 pt to continue current statin ipitor 80 and lower chol diet ? ?

## 2021-06-05 NOTE — Assessment & Plan Note (Addendum)
Mild to mod, for depomedrol 80 mg IM, prednisone asd, zyrtec and nasacort asd, to f/u any worsening symptoms or concerns, declines allergy referral for now ?

## 2021-06-05 NOTE — Assessment & Plan Note (Signed)
Also for mucinex bid prn,  to f/u any worsening symptoms or concerns 

## 2021-06-05 NOTE — Assessment & Plan Note (Signed)
BP Readings from Last 3 Encounters:  ?06/04/21 120/68  ?04/13/21 120/70  ?07/25/20 120/76  ? ?Stable, pt to continue medical treatment lopresor ? ?

## 2021-06-17 ENCOUNTER — Other Ambulatory Visit (HOSPITAL_BASED_OUTPATIENT_CLINIC_OR_DEPARTMENT_OTHER): Payer: Self-pay

## 2021-06-22 ENCOUNTER — Other Ambulatory Visit (HOSPITAL_BASED_OUTPATIENT_CLINIC_OR_DEPARTMENT_OTHER): Payer: Self-pay

## 2021-07-29 DIAGNOSIS — G4733 Obstructive sleep apnea (adult) (pediatric): Secondary | ICD-10-CM | POA: Diagnosis not present

## 2021-08-17 ENCOUNTER — Other Ambulatory Visit: Payer: Self-pay | Admitting: Internal Medicine

## 2021-08-17 ENCOUNTER — Other Ambulatory Visit (HOSPITAL_BASED_OUTPATIENT_CLINIC_OR_DEPARTMENT_OTHER): Payer: Self-pay

## 2021-08-17 NOTE — Telephone Encounter (Signed)
Please refill as per office routine med refill policy (all routine meds to be refilled for 3 mo or monthly (per pt preference) up to one year from last visit, then month to month grace period for 3 mo, then further med refills will have to be denied) ? ?

## 2021-08-18 ENCOUNTER — Other Ambulatory Visit (HOSPITAL_BASED_OUTPATIENT_CLINIC_OR_DEPARTMENT_OTHER): Payer: Self-pay

## 2021-08-18 MED ORDER — PANTOPRAZOLE SODIUM 40 MG PO TBEC
DELAYED_RELEASE_TABLET | Freq: Every day | ORAL | 3 refills | Status: DC
  Filled 2021-08-18: qty 90, 90d supply, fill #0
  Filled 2021-11-17: qty 90, 90d supply, fill #1
  Filled 2022-02-14: qty 90, 90d supply, fill #2

## 2021-08-27 ENCOUNTER — Encounter: Payer: Self-pay | Admitting: Cardiology

## 2021-08-27 ENCOUNTER — Ambulatory Visit (HOSPITAL_COMMUNITY): Payer: 59 | Attending: Cardiology

## 2021-08-27 ENCOUNTER — Other Ambulatory Visit: Payer: Self-pay | Admitting: Cardiology

## 2021-08-27 DIAGNOSIS — I77810 Thoracic aortic ectasia: Secondary | ICD-10-CM | POA: Insufficient documentation

## 2021-08-27 DIAGNOSIS — I351 Nonrheumatic aortic (valve) insufficiency: Secondary | ICD-10-CM | POA: Insufficient documentation

## 2021-08-27 DIAGNOSIS — I2510 Atherosclerotic heart disease of native coronary artery without angina pectoris: Secondary | ICD-10-CM | POA: Insufficient documentation

## 2021-08-27 LAB — ECHOCARDIOGRAM COMPLETE
AR max vel: 1.08 cm2
AV Area VTI: 1.2 cm2
AV Area mean vel: 1.02 cm2
AV Mean grad: 11 mmHg
AV Peak grad: 19.4 mmHg
Ao pk vel: 2.2 m/s
Area-P 1/2: 3.75 cm2
P 1/2 time: 385 msec
S' Lateral: 3.4 cm

## 2021-08-29 IMAGING — MR MR CERVICAL SPINE W/O CM
5 series · 36 of 48 positions shown · non-contrast
Comparison: Cervical spine radiographs from 03/08/2018

CLINICAL DATA: Neck pain and bilateral arm pain with right hand
numbness for 1 year

EXAM:
MRI CERVICAL SPINE WITHOUT CONTRAST
TECHNIQUE: Multiplanar, multisequence MR imaging of the cervical spine was
performed. No intravenous contrast was administered.

[Series 2: T2 · sagittal · 3.0mm · 0.41mm/px · 8 of 17 slices shown (1 of 2)]
[im 1/17]
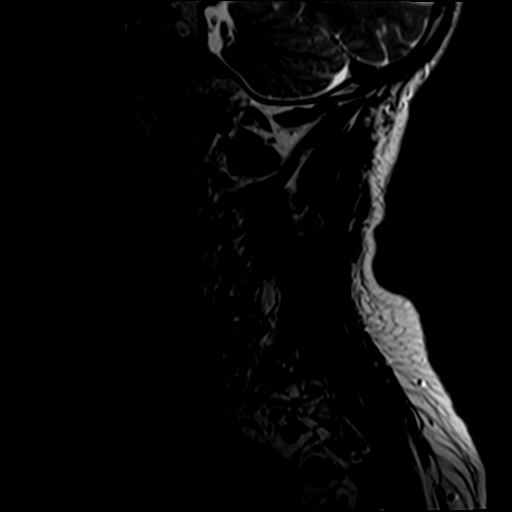
[im 3/17]
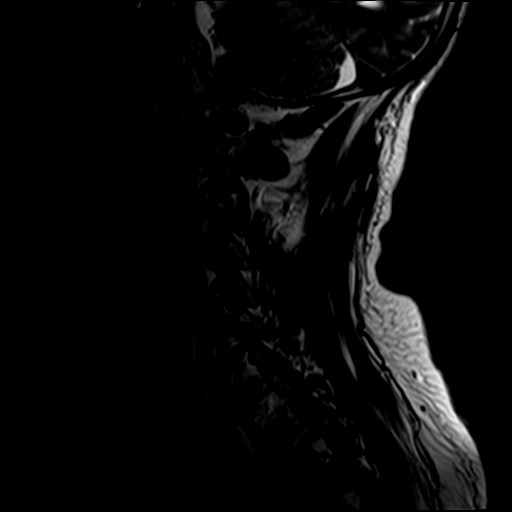
[im 5/17]
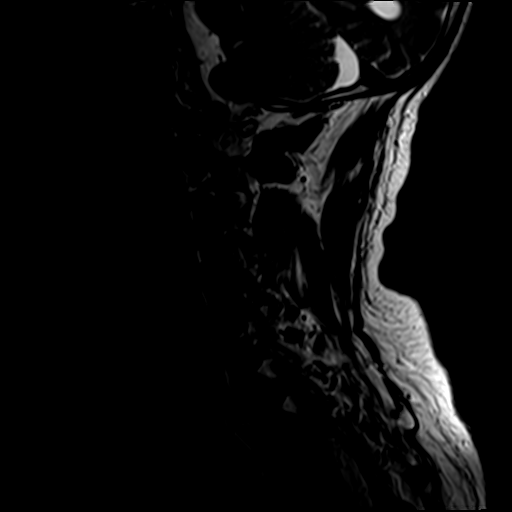
[im 7/17]
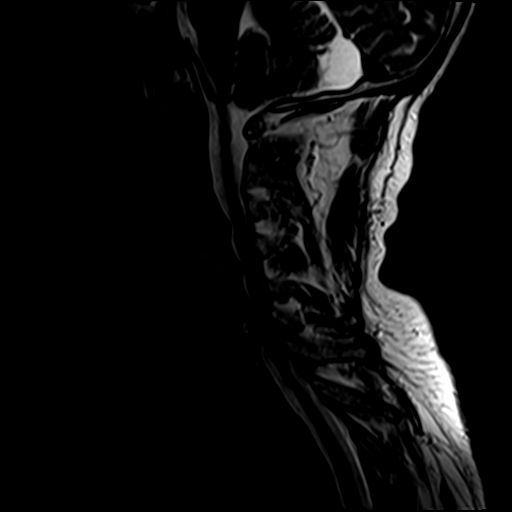
[im 10/17]
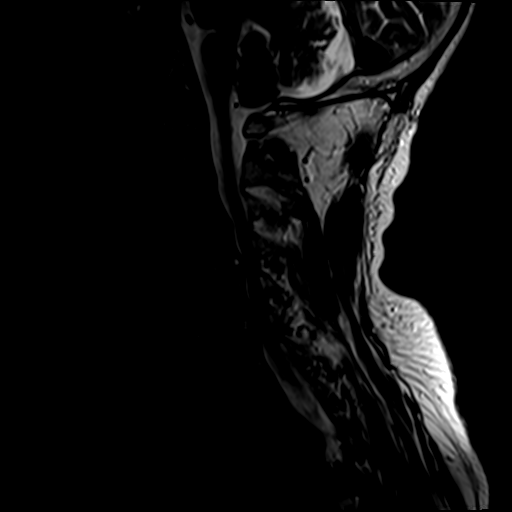
[im 12/17]
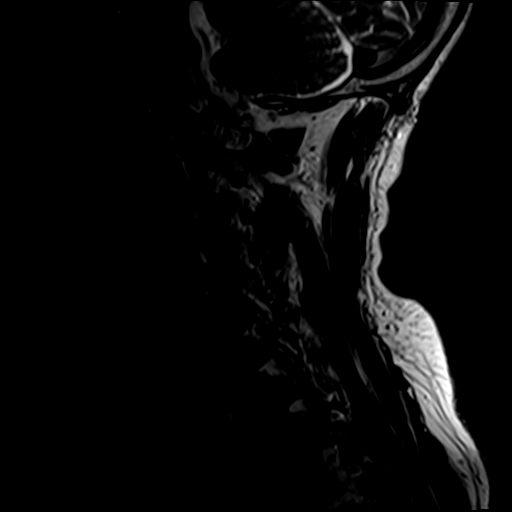
[im 14/17]
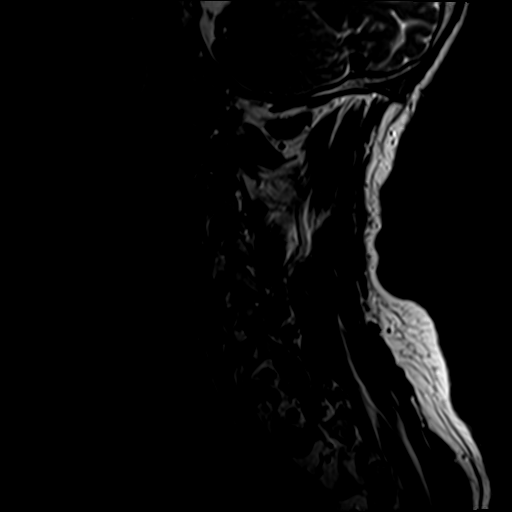
[im 17/17]
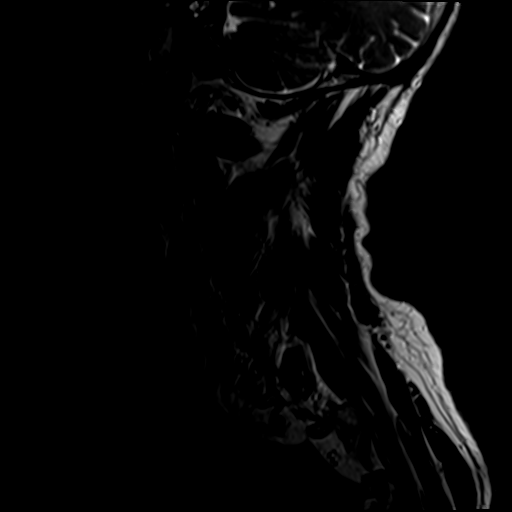

[Series 3: STIR · sagittal · 3.0mm · 0.82mm/px · 8 of 17 slices shown]
[im 1/17]
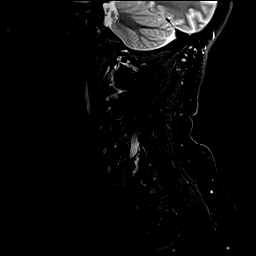
[im 3/17]
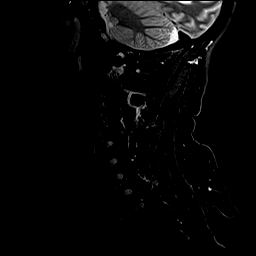
[im 5/17]
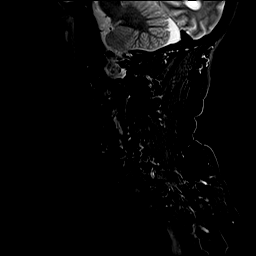
[im 7/17]
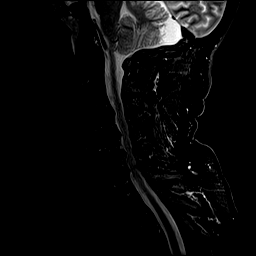
[im 10/17]
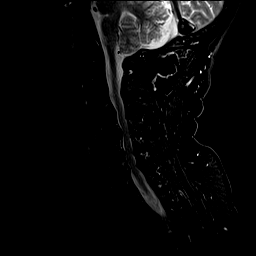
[im 12/17]
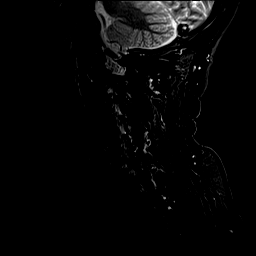
[im 14/17]
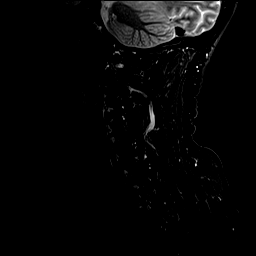
[im 17/17]
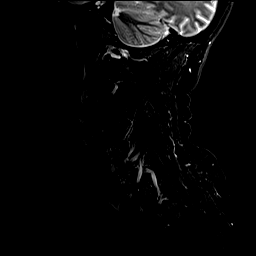

[Series 4: T1 · sagittal · 3.0mm · 0.82mm/px · 8 of 17 slices shown]
[im 1/17]
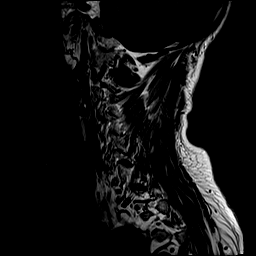
[im 3/17]
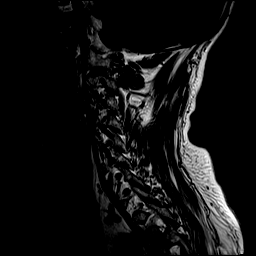
[im 5/17]
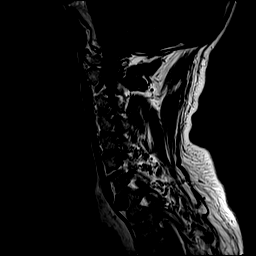
[im 7/17]
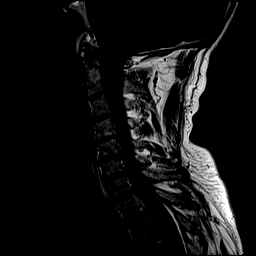
[im 10/17]
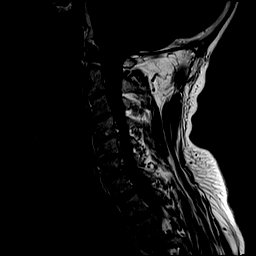
[im 12/17]
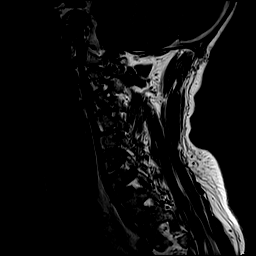
[im 14/17]
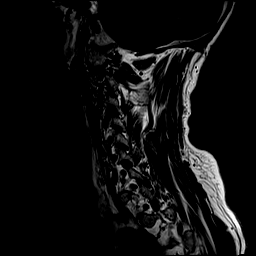
[im 17/17]
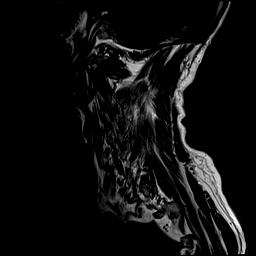

[Series 5: T2 · axial · 3.0mm · 0.70mm/px · z∈[-84,+11]mm · 9 of 27 slices shown (2 of 2)]
[im 1/27]
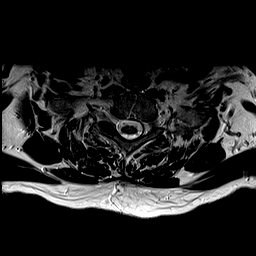
[im 5/27]
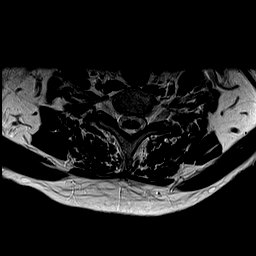
[im 8/27]
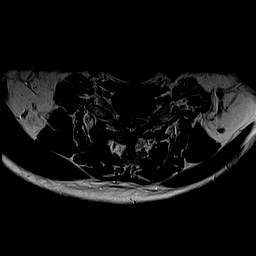
[im 12/27]
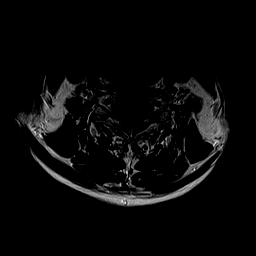
[im 15/27]
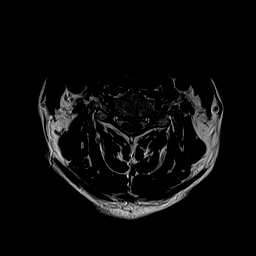
[im 19/27]
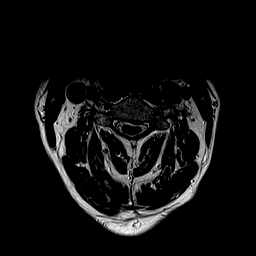
[im 22/27]
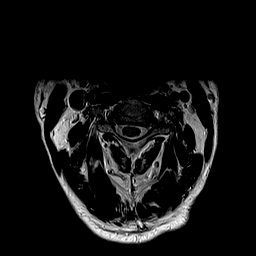
[im 24/27]
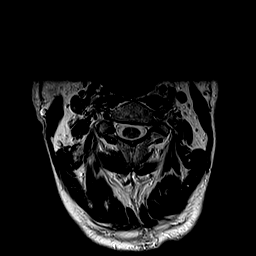
[im 27/27]
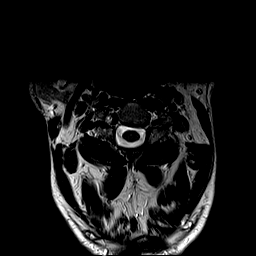

[Series 6: GRE · axial · 3.0mm · 0.35mm/px · z∈[-82,-60]mm · 3 of 26 slices shown]
[im 1/26]
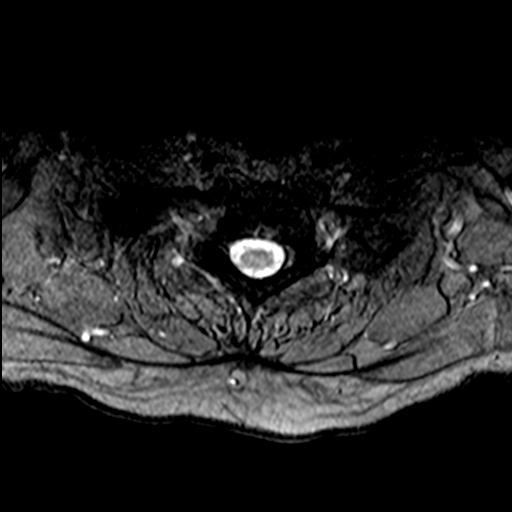
[im 5/26]
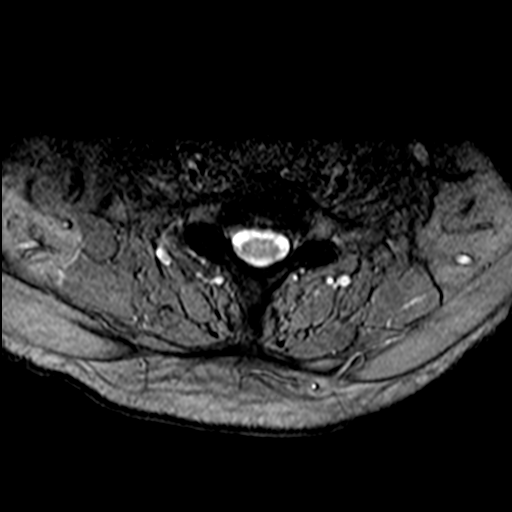
[im 7/26]
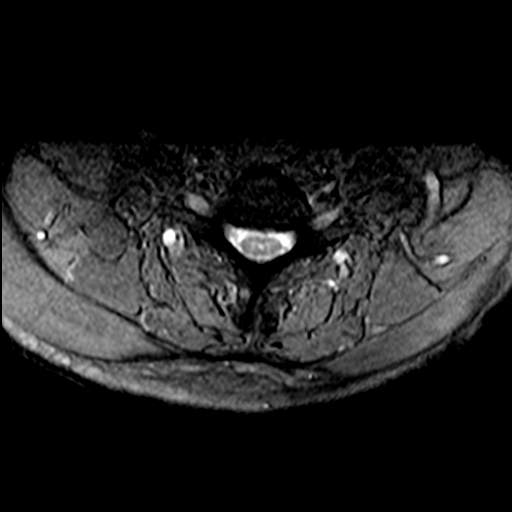

[36 of 48 positions shown; findings below may reference images not displayed]

FINDINGS: Alignment: 1.5 mm degenerative retrolisthesis at C5-6.

Vertebrae: Disc desiccation at all levels between C2 and C7 with
associated loss of disc height. No significant vertebral marrow
edema is identified.

Cord: Visibility of the central canal at the C7 level noted, but
without a T1 signal abnormality hence not thought represent a
syrinx. No discrete cord edema or cord lesion identified.

Posterior Fossa, vertebral arteries, paraspinal tissues:
Unremarkable

Disc levels:

C2-3: Mild left foraminal stenosis due to facet arthropathy.

C3-4: Moderate bilateral foraminal stenosis and mild central
narrowing of the thecal sac due to disc bulge, uncinate spurring,
and facet arthropathy.

C4-5: Prominent bilateral foraminal stenosis and moderate central
narrowing of the thecal sac due to disc osteophyte complex, uncinate
spurring, and facet arthropathy.

C5-6: Prominent right and moderate to prominent left foraminal
stenosis and moderate central narrowing of the thecal sac due to
disc osteophyte complex and uncinate spurring.

C6-7: Moderate central narrowing of the thecal sac and borderline
right foraminal stenosis due to disc bulge, central annular tear,
and mild uncinate spurring.

C7-T1: Unremarkable.
IMPRESSION: 1. Cervical spondylosis and degenerative disc disease causing
prominent impingement at C4-5 and C5-6; moderate impingement at C3-4
and C6-7; and mild impingement at C2-3.

## 2021-09-15 ENCOUNTER — Other Ambulatory Visit (HOSPITAL_BASED_OUTPATIENT_CLINIC_OR_DEPARTMENT_OTHER): Payer: Self-pay

## 2021-09-15 ENCOUNTER — Other Ambulatory Visit: Payer: Self-pay | Admitting: Family Medicine

## 2021-09-15 DIAGNOSIS — B9689 Other specified bacterial agents as the cause of diseases classified elsewhere: Secondary | ICD-10-CM

## 2021-09-15 MED ORDER — AMOXICILLIN-POT CLAVULANATE 875-125 MG PO TABS
1.0000 | ORAL_TABLET | Freq: Two times a day (BID) | ORAL | 0 refills | Status: AC
  Filled 2021-09-15: qty 20, 10d supply, fill #0

## 2021-09-15 NOTE — Progress Notes (Signed)
Strep exposure, tonsil exudate and swelling with pain and fever

## 2021-09-18 ENCOUNTER — Telehealth: Payer: Self-pay

## 2021-09-18 DIAGNOSIS — I351 Nonrheumatic aortic (valve) insufficiency: Secondary | ICD-10-CM

## 2021-09-18 DIAGNOSIS — I77810 Thoracic aortic ectasia: Secondary | ICD-10-CM

## 2021-09-30 ENCOUNTER — Other Ambulatory Visit: Payer: Self-pay | Admitting: Family Medicine

## 2021-09-30 ENCOUNTER — Other Ambulatory Visit: Payer: 59

## 2021-09-30 DIAGNOSIS — S40261A Insect bite (nonvenomous) of right shoulder, initial encounter: Secondary | ICD-10-CM

## 2021-09-30 DIAGNOSIS — R6889 Other general symptoms and signs: Secondary | ICD-10-CM | POA: Diagnosis not present

## 2021-09-30 DIAGNOSIS — W57XXXA Bitten or stung by nonvenomous insect and other nonvenomous arthropods, initial encounter: Secondary | ICD-10-CM | POA: Diagnosis not present

## 2021-09-30 NOTE — Progress Notes (Signed)
Tick bite to right posterior shoulder with surrounding erythema. Likely there for 4-5 days per pt. Has had flu-like symptoms.

## 2021-09-30 NOTE — Addendum Note (Signed)
Addended by: Baruch Gouty on: 09/30/2021 09:36 AM   Modules accepted: Orders

## 2021-10-05 ENCOUNTER — Ambulatory Visit (HOSPITAL_COMMUNITY): Payer: 59

## 2021-10-06 LAB — ALPHA-GAL PANEL
Allergen Lamb IgE: <0.1 kU/L
Beef IgE: <0.1 kU/L
IgE (Immunoglobulin E), Serum: 166 IU/mL (ref 6–495)
O215-IgE Alpha-Gal: 0.2 kU/L — AB
Pork IgE: <0.1 kU/L

## 2021-10-06 LAB — ROCKY MTN SPOTTED FVR ABS PNL(IGG+IGM)
RMSF IgG: UNDETERMINED
RMSF IgM: 0.21 index (ref 0.00–0.89)

## 2021-10-06 LAB — RMSF, IGG, IFA: RMSF, IGG, IFA: 1:64 {titer} — ABNORMAL HIGH

## 2021-10-06 LAB — LYME DISEASE SEROLOGY W/REFLEX: Lyme Total Antibody EIA: NEGATIVE

## 2021-10-08 ENCOUNTER — Encounter: Payer: Self-pay | Admitting: Gastroenterology

## 2021-10-08 ENCOUNTER — Telehealth: Payer: Self-pay | Admitting: Internal Medicine

## 2021-10-08 NOTE — Telephone Encounter (Signed)
Pt called and stated [employer] is requesting an rx from Dr. Jenny Reichmann in order to repair/replace prosthetic hand equipment. Pt has appointment scheduled with Dr. Jenny Reichmann on 10/26/21 but he was wondering if the rx can written and sent to [facility] without an office visit due to the pt needing his prosthetic equipment to perform his job. Pt stated he has paperwork confirming that his equipment needs to be replaced.     Please advise   CB: [phone].

## 2021-10-12 ENCOUNTER — Encounter: Payer: Self-pay | Admitting: Internal Medicine

## 2021-10-12 ENCOUNTER — Ambulatory Visit (HOSPITAL_COMMUNITY)
Admission: RE | Admit: 2021-10-12 | Discharge: 2021-10-12 | Disposition: A | Discharge status: Home / Self Care | Payer: 59 | Source: Ambulatory Visit | Attending: Cardiology | Admitting: Cardiology

## 2021-10-12 ENCOUNTER — Other Ambulatory Visit (HOSPITAL_BASED_OUTPATIENT_CLINIC_OR_DEPARTMENT_OTHER): Payer: Self-pay

## 2021-10-12 ENCOUNTER — Ambulatory Visit: Payer: 59 | Admitting: Internal Medicine

## 2021-10-12 ENCOUNTER — Ambulatory Visit (INDEPENDENT_AMBULATORY_CARE_PROVIDER_SITE_OTHER): Payer: 59

## 2021-10-12 VITALS — BP 112/68 | HR 70 | Temp 98.9°F | Ht 71.0 in | Wt 192.8 lb

## 2021-10-12 DIAGNOSIS — R051 Acute cough: Secondary | ICD-10-CM | POA: Diagnosis not present

## 2021-10-12 DIAGNOSIS — E559 Vitamin D deficiency, unspecified: Secondary | ICD-10-CM

## 2021-10-12 DIAGNOSIS — S58112S Complete traumatic amputation at level between elbow and wrist, left arm, sequela: Secondary | ICD-10-CM

## 2021-10-12 DIAGNOSIS — I351 Nonrheumatic aortic (valve) insufficiency: Secondary | ICD-10-CM

## 2021-10-12 DIAGNOSIS — J9811 Atelectasis: Secondary | ICD-10-CM | POA: Diagnosis not present

## 2021-10-12 DIAGNOSIS — R059 Cough, unspecified: Secondary | ICD-10-CM | POA: Diagnosis not present

## 2021-10-12 DIAGNOSIS — H9202 Otalgia, left ear: Secondary | ICD-10-CM

## 2021-10-12 DIAGNOSIS — I77810 Thoracic aortic ectasia: Secondary | ICD-10-CM

## 2021-10-12 DIAGNOSIS — I2510 Atherosclerotic heart disease of native coronary artery without angina pectoris: Secondary | ICD-10-CM | POA: Diagnosis not present

## 2021-10-12 MED ORDER — HYDROCODONE BIT-HOMATROP MBR 5-1.5 MG/5ML PO SOLN
5.0000 mL | Freq: Four times a day (QID) | ORAL | 0 refills | Status: AC | PRN
  Filled 2021-10-12: qty 180, 10d supply, fill #0

## 2021-10-12 MED ORDER — IOHEXOL 350 MG/ML SOLN
100.0000 mL | Freq: Once | INTRAVENOUS | Status: AC | PRN
  Administered 2021-10-12: 100 mL via INTRAVENOUS

## 2021-10-12 MED ORDER — CEFUROXIME AXETIL 500 MG PO TABS
250.0000 mg | ORAL_TABLET | Freq: Two times a day (BID) | ORAL | 0 refills | Status: AC
  Filled 2021-10-12: qty 10, 10d supply, fill #0

## 2021-10-12 NOTE — Patient Instructions (Signed)
Please take all new medication as prescribed - the antibiotic, cough medicine  Please take OTC Vitamin D3 at 2000 units per day, indefinitely  Please continue all other medications as before, and refills have been done if requested.  Please have the pharmacy call with any other refills you may need.  Please continue your efforts at being more active, low cholesterol diet, and weight control.  You are otherwise up to date with prevention measures today.  Please keep your appointments with your specialists as you may have planned  You will be contacted regarding the referral for: ENT  Please go to the [facility] in the first floor for the x-ray testing  You will be contacted by phone if any changes need to be made immediately.  Otherwise, you will receive a letter about your results with an explanation, but please check with MyChart first.  Please remember to sign up for MyChart if you have not done so, as this will be important to you in the future with finding out test results, communicating by private email, and scheduling acute appointments online when needed.

## 2021-10-12 NOTE — Progress Notes (Unsigned)
[name], male   DOB: [birth year], 56 y.o.   MRN: [id number]        Chief Complaint: follow up right ear pain, s/p left elbow amputation for prosthetics repair, low vit d, cough       HPI:  Isaac Patel is a 56 y.o. male here with c/o 1 wk non prod cough without fever, has mild allergy symptoms, and worsening left ear pain despite otc med use, asking for ENT referral.  Not taking Vit D.  Needs new rx for prosthetic repair.  Pt denies chest pain, increased sob or doe, wheezing, orthopnea, PND, increased LE swelling, palpitations, dizziness or syncope.   Pt denies polydipsia, polyuria, or new focal neuro s/s.    Pt denies fever, wt loss, night sweats, loss of appetite, or other constitutional symptoms         Wt Readings from Last 3 Encounters:  10/12/21 192 lb 12.8 oz (87.5 kg)  06/04/21 201 lb (91.2 kg)  04/13/21 195 lb (88.5 kg)   BP Readings from Last 3 Encounters:  10/12/21 112/68  06/04/21 120/68  04/13/21 120/70         Past Medical History:  Diagnosis Date   Aortic insufficiency    Status post aortic valve repair // Echo 8/20: EF 50-55, moderate LVH, grade 1 diastolic dysfunction, normal RV SF, status post AV repair, mild AI, mild dilation of aortic root (38 mm)   CAD (coronary artery disease)    a.  Non-STEMI 8/16: LHC-proximal LAD 80% treated with a resolute DES, EF 50-55%;  b.  Echo 8/16:  Moderate LVH, EF 55-60%, normal wall motion, normal diastolic function, normal RV function   CHF (congestive heart failure) ([place])    Dilated aortic root (HCC)    31m by echo 08/2020 and 41 mm by echo 08/2021 and 44 mm by chest CTA 09-2021   Dyspnea    mild   GERD (gastroesophageal reflux disease)    Headache    "maybe monthly" (10/28/2014)   HLD (hyperlipidemia)    Hypertension    NSTEMI (non-ST elevated myocardial infarction) [place] 10/28/2014   OSA (obstructive sleep apnea)    "tried mask; wear it off and on" (10/28/2014)   S/P aortic valve repair 09/15/2018   Complex  valvuloplasty including plication of prolapsing right coronary cusp with Biostable HAART 300 aortic annuloplasty, size 21 mm   Past Surgical History:  Procedure Laterality Date   AORTIC VALVE REPAIR N/A 09/15/2018   Procedure: AORTIC VALVE REPAIR USING HAART 300 AORTIC ANNULOPLASTY DEVICE SIZE 21MM;  Surgeon: ORexene Alberts MD;  [facility]  Service: Open Heart Surgery;  Laterality: N/A;   ARM AMPUTATION THROUGH FOREARM Left 2009   traumatic injury   CARDIAC CATHETERIZATION N/A 10/29/2014   Procedure: Left Heart Cath and Coronary Angiography;  Surgeon: TTroy Sine MD;  [facility];  Service: Cardiovascular;  Laterality: N/A;   CHOLESTEATOMA EXCISION Right 1990's   LEFT HEART CATH AND CORONARY ANGIOGRAPHY N/A 05/25/2016   Procedure: Left Heart Cath and Coronary Angiography;  Surgeon: MSherren Mocha MD;  [facility];  Service: Cardiovascular;  Laterality: N/A;   MULTIPLE TOOTH EXTRACTIONS  08/29/2018   NASAL SEPTOPLASTY W/ TURBINOPLASTY Bilateral 11/30/2017   Procedure: NASAL SEPTOPLASTY WITH TURBINATE REDUCTION;  Surgeon: SJerrell Belfast MD;  Location: [facility]  Service: ENT;  Laterality: Bilateral;   RIGHT/LEFT HEART CATH AND CORONARY ANGIOGRAPHY N/A 08/11/2018   Procedure: RIGHT/LEFT HEART CATH AND CORONARY ANGIOGRAPHY;  Surgeon: Burnell Blanks, MD;  Location: [place] [facility];  Service: Cardiovascular;  Laterality: N/A;   TEE WITHOUT CARDIOVERSION N/A 06/08/2018   Procedure: TRANSESOPHAGEAL ECHOCARDIOGRAM (TEE);  Surgeon: Buford Dresser, MD;  Location[facility];  Service: Cardiovascular;  Laterality: N/A;   TEE WITHOUT CARDIOVERSION N/A 09/15/2018   Procedure: TRANSESOPHAGEAL ECHOCARDIOGRAM (TEE);  Surgeon: Rexene Alberts, MD;  Location: [place];  Service: Open Heart Surgery;  Laterality: N/A;   TYMPANOSTOMY TUBE PLACEMENT Bilateral "as a kid"   "had one done as an adult too"   WISDOM TOOTH EXTRACTION      reports that he has  been smoking cigarettes. He has a 16.00 pack-year smoking history. He has never used smokeless tobacco. He reports current alcohol use of about 2.0 - 3.0 standard drinks of alcohol per week. He reports that he does not currently use drugs after having used the following drugs: Marijuana. family history includes Bradycardia in his father; Diverticulitis in his mother; Healthy in his father; Multiple sclerosis in his maternal grandfather; Skin cancer in his mother. Allergies  Allergen Reactions   Morphine And Related Itching   Current Outpatient Medications on File Prior to Visit  Medication Sig Dispense Refill   aspirin EC 81 MG tablet Take 81 mg by mouth daily.     atorvastatin (LIPITOR) 80 MG tablet Take 1 tablet (80 mg total) by mouth daily. 90 tablet 3   calcium carbonate (TUMS - DOSED IN MG ELEMENTAL CALCIUM) 500 MG chewable tablet Chew 2 tablets by mouth daily as needed for indigestion or heartburn.     cetirizine (ZYRTEC) 10 MG tablet Take 1 tablet (10 mg total) by mouth daily. 30 tablet 11   fluticasone (FLONASE) 50 MCG/ACT nasal spray Place 1 spray into both nostrils daily as needed for allergies or rhinitis.     loratadine (CLARITIN) 10 MG tablet Take 10 mg by mouth daily.     meclizine (ANTIVERT) 12.5 MG tablet Take 1 tablet (12.5 mg total) by mouth 3 (three) times daily as needed for dizziness. 40 tablet 2   metoprolol tartrate (LOPRESSOR) 25 MG tablet Take 1 tablet (25 mg total) by mouth 2 (two) times daily. 180 tablet 3   Multiple Vitamin (MULTI-VITAMIN PO) Take 1 tablet by mouth daily.     nitroGLYCERIN (NITROSTAT) 0.4 MG SL tablet Place 1 tablet (0.4 mg total) under the tongue every 5 (five) minutes x 3 doses as needed for chest pain. 25 tablet 3   pantoprazole (PROTONIX) 40 MG tablet TAKE 1 TABLET (40 MG TOTAL) BY MOUTH DAILY. 90 tablet 3   triamcinolone (NASACORT) 55 MCG/ACT AERO nasal inhaler Place 2 sprays into the nose daily. 1 each 12   gabapentin (NEURONTIN) 100 MG capsule  TAKE 2 CAPSULES (200 MG TOTAL) BY MOUTH AT BEDTIME. 180 capsule 0   guaiFENesin (MUCINEX) 600 MG 12 hr tablet Take 2 tablets (1,200 mg total) by mouth 2 (two) times daily as needed. (Patient not taking: Reported on 10/12/2021) 60 tablet 1   No current facility-administered medications on file prior to visit.        ROS:  All others reviewed and negative.  Objective        PE:  BP 112/68 (BP Location: Right Arm, Patient Position: Sitting, Cuff Size: Large)   Pulse 70   Temp 98.9 F (37.2 C) (Oral)   Ht '5\' 11"'$  (1.803 m)   Wt 192 lb 12.8 oz (87.5 kg)   SpO2 97%   BMI 26.89 kg/m  Constitutional: Pt appears in NAD               HENT: Head: NCAT.                Right Ear: External ear normal.                 Left Ear: External ear normal.  Bilat tm's with mild erythema.  Max sinus areas non tender.  Pharynx with mild erythema, no exudate               Eyes: . Pupils are equal, round, and reactive to light. Conjunctivae and EOM are normal               Nose: without d/c or deformity               Neck: Neck supple. Gross normal ROM               Cardiovascular: Normal rate and regular rhythm.                 Pulmonary/Chest: Effort normal and breath sounds without rales or wheezing.                Abd:  Soft, NT, ND, + BS, no organomegaly; s/p below left elbow amputation               Neurological: Pt is alert. At baseline orientation, motor grossly intact               Skin: Skin is warm. No rashes, no other new lesions, LE edema - none               Psychiatric: Pt behavior is normal without agitation   Micro: none  Cardiac tracings I have personally interpreted today:  none  Pertinent Radiological findings (summarize): none   Lab Results  Component Value Date   WBC 6.8 06/04/2021   HGB 15.4 06/04/2021   HCT 44.7 06/04/2021   PLT 214.0 06/04/2021   GLUCOSE 86 06/04/2021   CHOL 134 06/04/2021   TRIG 64.0 06/04/2021   HDL 47.80 06/04/2021   LDLCALC 73  06/04/2021   ALT 19 06/04/2021   AST 21 06/04/2021   NA 140 06/04/2021   K 3.8 06/04/2021   CL 103 06/04/2021   CREATININE 1.04 06/04/2021   BUN 13 06/04/2021   CO2 29 06/04/2021   TSH 1.22 06/04/2021   PSA 0.84 06/04/2021   INR 1.4 (H) 09/15/2018   HGBA1C 5.9 06/04/2021   Assessment/Plan:  Isaac Patel is a 56 y.o. White or Caucasian [1] male with  has a past medical history of Aortic insufficiency, CAD (coronary artery disease), CHF (congestive heart failure) ([place]), Dilated aortic root ([place]), Dyspnea, GERD (gastroesophageal reflux disease), Headache, HLD (hyperlipidemia), Hypertension, NSTEMI (non-ST elevated myocardial infarction) ([place]) (10/28/2014), OSA (obstructive sleep apnea), and S/P aortic valve repair (09/15/2018).  Amputation of arm below elbow [facility]) [place] for rx for prosthetics repair  Vitamin D deficiency Last vitamin D Lab Results  Component Value Date   VD25OH 32.66 06/04/2021   Low to start oral replacement   Left ear pain Etiology unclear, for ENT referral  Acute cough Exam benign, for cxr,  to f/u any worsening symptoms or concerns  Followup: No follow-ups on file.  Cathlean Cower, MD 10/14/2021 8:08 PM [facility]

## 2021-10-14 ENCOUNTER — Encounter: Payer: Self-pay | Admitting: Internal Medicine

## 2021-10-14 DIAGNOSIS — H9202 Otalgia, left ear: Secondary | ICD-10-CM | POA: Insufficient documentation

## 2021-10-14 DIAGNOSIS — E559 Vitamin D deficiency, unspecified: Secondary | ICD-10-CM | POA: Insufficient documentation

## 2021-10-14 DIAGNOSIS — R051 Acute cough: Secondary | ICD-10-CM | POA: Insufficient documentation

## 2021-10-14 NOTE — Assessment & Plan Note (Signed)
Last vitamin D Lab Results  Component Value Date   VD25OH 32.66 06/04/2021   Low to start oral replacement

## 2021-10-14 NOTE — Assessment & Plan Note (Signed)
Exam benign, for cxr,  to f/u any worsening symptoms or concerns

## 2021-10-14 NOTE — Assessment & Plan Note (Signed)
Ok for rx for Engineer, agricultural

## 2021-10-14 NOTE — Assessment & Plan Note (Signed)
Etiology unclear, for ENT referral

## 2021-10-26 ENCOUNTER — Ambulatory Visit: Payer: 59 | Admitting: Internal Medicine

## 2021-11-17 ENCOUNTER — Other Ambulatory Visit (HOSPITAL_COMMUNITY): Payer: Self-pay

## 2021-11-17 ENCOUNTER — Other Ambulatory Visit (HOSPITAL_BASED_OUTPATIENT_CLINIC_OR_DEPARTMENT_OTHER): Payer: Self-pay

## 2021-11-20 ENCOUNTER — Telehealth: Payer: Self-pay

## 2021-11-20 NOTE — Telephone Encounter (Signed)
Barbara with [facility] is calling to check in on paperwork she has faxed over for the pt on 11/16/2021 in regards to the pts prosthetists.  Please fax over paperwork to [phone]... Osie Bond

## 2021-11-26 NOTE — Telephone Encounter (Signed)
Documents faxed to [facility] clinic on 8/29 and I called them to confirm that it was received.

## 2021-12-10 DIAGNOSIS — Z89212 Acquired absence of left upper limb below elbow: Secondary | ICD-10-CM | POA: Diagnosis not present

## 2022-01-18 DIAGNOSIS — H6983 Other specified disorders of Eustachian tube, bilateral: Secondary | ICD-10-CM | POA: Diagnosis not present

## 2022-01-18 DIAGNOSIS — H6121 Impacted cerumen, right ear: Secondary | ICD-10-CM | POA: Diagnosis not present

## 2022-01-18 DIAGNOSIS — H903 Sensorineural hearing loss, bilateral: Secondary | ICD-10-CM | POA: Diagnosis not present

## 2022-01-29 DIAGNOSIS — G4733 Obstructive sleep apnea (adult) (pediatric): Secondary | ICD-10-CM | POA: Diagnosis not present

## 2022-02-15 ENCOUNTER — Other Ambulatory Visit (HOSPITAL_BASED_OUTPATIENT_CLINIC_OR_DEPARTMENT_OTHER): Payer: Self-pay

## 2022-03-01 ENCOUNTER — Ambulatory Visit (INDEPENDENT_AMBULATORY_CARE_PROVIDER_SITE_OTHER): Payer: 59

## 2022-03-01 ENCOUNTER — Other Ambulatory Visit (HOSPITAL_BASED_OUTPATIENT_CLINIC_OR_DEPARTMENT_OTHER): Payer: Self-pay

## 2022-03-01 ENCOUNTER — Ambulatory Visit: Payer: 59 | Admitting: Internal Medicine

## 2022-03-01 VITALS — BP 122/78 | HR 81 | Temp 98.1°F | Ht 71.0 in | Wt 192.0 lb

## 2022-03-01 DIAGNOSIS — M546 Pain in thoracic spine: Secondary | ICD-10-CM | POA: Diagnosis not present

## 2022-03-01 DIAGNOSIS — R7303 Prediabetes: Secondary | ICD-10-CM

## 2022-03-01 DIAGNOSIS — R140 Abdominal distension (gaseous): Secondary | ICD-10-CM | POA: Diagnosis not present

## 2022-03-01 DIAGNOSIS — R109 Unspecified abdominal pain: Secondary | ICD-10-CM | POA: Diagnosis not present

## 2022-03-01 DIAGNOSIS — R1084 Generalized abdominal pain: Secondary | ICD-10-CM | POA: Diagnosis not present

## 2022-03-01 DIAGNOSIS — D2230 Melanocytic nevi of unspecified part of face: Secondary | ICD-10-CM | POA: Insufficient documentation

## 2022-03-01 DIAGNOSIS — D1800 Hemangioma unspecified site: Secondary | ICD-10-CM | POA: Insufficient documentation

## 2022-03-01 DIAGNOSIS — L821 Other seborrheic keratosis: Secondary | ICD-10-CM | POA: Insufficient documentation

## 2022-03-01 DIAGNOSIS — D229 Melanocytic nevi, unspecified: Secondary | ICD-10-CM | POA: Insufficient documentation

## 2022-03-01 LAB — CBC WITH DIFFERENTIAL/PLATELET
Basophils Absolute: 0.1 K/uL (ref 0.0–0.1)
Basophils Relative: 0.6 % (ref 0.0–3.0)
Eosinophils Absolute: 0.3 K/uL (ref 0.0–0.7)
Eosinophils Relative: 3 % (ref 0.0–5.0)
HCT: 46.5 % (ref 39.0–52.0)
Hemoglobin: 15.9 g/dL (ref 13.0–17.0)
Lymphocytes Relative: 16.7 % (ref 12.0–46.0)
Lymphs Abs: 1.5 K/uL (ref 0.7–4.0)
MCHC: 34.1 g/dL (ref 30.0–36.0)
MCV: 96.2 fl (ref 78.0–100.0)
Monocytes Absolute: 0.8 K/uL (ref 0.1–1.0)
Monocytes Relative: 9.1 % (ref 3.0–12.0)
Neutro Abs: 6.4 K/uL (ref 1.4–7.7)
Neutrophils Relative %: 70.6 % (ref 43.0–77.0)
Platelets: 245 K/uL (ref 150.0–400.0)
RBC: 4.84 Mil/uL (ref 4.22–5.81)
RDW: 13.5 % (ref 11.5–15.5)
WBC: 9 K/uL (ref 4.0–10.5)

## 2022-03-01 LAB — HEPATIC FUNCTION PANEL
ALT: 22 U/L (ref 0–53)
AST: 25 U/L (ref 0–37)
Albumin: 4.5 g/dL (ref 3.5–5.2)
Alkaline Phosphatase: 72 U/L (ref 39–117)
Bilirubin, Direct: 0.2 mg/dL (ref 0.0–0.3)
Total Bilirubin: 0.7 mg/dL (ref 0.2–1.2)
Total Protein: 7.2 g/dL (ref 6.0–8.3)

## 2022-03-01 LAB — BASIC METABOLIC PANEL
BUN: 22 mg/dL (ref 6–23)
CO2: 28 mEq/L (ref 19–32)
Calcium: 9.5 mg/dL (ref 8.4–10.5)
Chloride: 104 mEq/L (ref 96–112)
Creatinine, Ser: 1.11 mg/dL (ref 0.40–1.50)
GFR: 73.86 mL/min (ref >60.00)
Glucose, Bld: 93 mg/dL (ref 70–99)
Potassium: 4.2 mEq/L (ref 3.5–5.1)
Sodium: 139 mEq/L (ref 135–145)

## 2022-03-01 LAB — AMYLASE: Amylase: 40 U/L (ref 27–131)

## 2022-03-01 LAB — LIPASE: Lipase: 13 U/L (ref 11.0–59.0)

## 2022-03-01 MED ORDER — CYCLOBENZAPRINE HCL 5 MG PO TABS
5.0000 mg | ORAL_TABLET | Freq: Three times a day (TID) | ORAL | 1 refills | Status: DC | PRN
  Filled 2022-03-01: qty 40, 14d supply, fill #0

## 2022-03-01 MED ORDER — TRAMADOL HCL 50 MG PO TABS
50.0000 mg | ORAL_TABLET | Freq: Four times a day (QID) | ORAL | 0 refills | Status: DC | PRN
  Filled 2022-03-01: qty 30, 8d supply, fill #0

## 2022-03-01 NOTE — Progress Notes (Unsigned)
[name], male   DOB: 1965-04-06, 56 y.o.   MRN: [id number]        Chief Complaint: follow up generalized abd pain and thoracic back pain x 2 wks       HPI:  Isaac Patel is a 56 y.o. male here with c/o 2 wks onset generalized abd pain and bloating,, maybe worse at the epigastrium, without fever, chills, n/v/d or blood, but has had recent constipation as well. Stool softner did help.   Did have BM yesterday. Passing gas,  Also with mid midline thoracic back pain, that seemed to start after pulling leaves out of the pool.  Pt denies bowel or bladder change, fever, wt loss,  worsening LE pain/numbness/weakness, gait change or falls..  Pt denies chest pain, increased sob or doe, wheezing, orthopnea, PND, increased LE swelling, palpitations, dizziness or syncope.  Nervous today but states this is situational about his acute problem.  Still smoking, not ready to quit.        Wt Readings from Last 3 Encounters:  03/01/22 192 lb (87.1 kg)  10/12/21 192 lb 12.8 oz (87.5 kg)  06/04/21 201 lb (91.2 kg)   BP Readings from Last 3 Encounters:  03/01/22 122/78  10/12/21 112/68  06/04/21 120/68         Past Medical History:  Diagnosis Date   Aortic insufficiency    Status post aortic valve repair // Echo 8/20: EF 50-55, moderate LVH, grade 1 diastolic dysfunction, normal RV SF, status post AV repair, mild AI, mild dilation of aortic root (38 mm)   CAD (coronary artery disease)    a.  Non-STEMI 8/16: LHC-proximal LAD 80% treated with a resolute DES, EF 50-55%;  b.  Echo 8/16:  Moderate LVH, EF 55-60%, normal wall motion, normal diastolic function, normal RV function   CHF (congestive heart failure) ([place])    Dilated aortic root (HCC)    21m by echo 08/2020 and 41 mm by echo 08/2021 and 44 mm by chest CTA 09-2021   Dyspnea    mild   GERD (gastroesophageal reflux disease)    Headache    "maybe monthly" (10/28/2014)   HLD (hyperlipidemia)    Hypertension    [place]STEMI (non-ST elevated  myocardial infarction[place] 10/28[place]2014   OSA (obstructive sleep apnea)    "tried mask; wear it off and on" (10/28/2014)   S/P aortic valve repair 09/15/2018   Complex valvuloplasty including plication of prolapsing right coronary cusp with Biostable HAART 300 aortic annuloplasty, size 21 mm   Past Surgical History:  Procedure Laterality Date   AORTIC VALVE REPAIR N/A 09/15/2018   Procedure: AORTIC VALVE REPAIR USING HAART 300 AORTIC ANNULOPLASTY DEVICE SIZE 21MM;  Surgeon: ORexene Alberts MD;  [facility] [facility]  Service: Open Heart Surgery;  Laterality: N/A;   ARM AMPUTATION THROUGH FOREARM Left 2009   traumatic injury   CARDIAC CATHETERIZATION N/A 10/29/2014   Procedure: Left Heart Cath and Coronary Angiography;  Surgeon: TTroy Sine MD;  [facility];  Service: Cardiovascular;  Laterality: N/A;   CHOLESTEATOMA EXCISION Right 1990's   LEFT HEART CATH AND CORONARY ANGIOGRAPHY N/A 05/25/2016   Procedure: Left Heart Cath and Coronary Angiography;  Surgeon: MSherren Mocha MD;  [facility];  Service: Cardiovascular;  Laterality: N/A;   MULTIPLE TOOTH EXTRACTIONS  08/29/2018   NASAL SEPTOPLASTY W/ TURBINOPLASTY Bilateral 11/30/2017   Procedure: NASAL SEPTOPLASTY WITH TURBINATE REDUCTION;  Surgeon: SJerrell Belfast MD;  [facility]  Service: ENT;  Laterality: Bilateral;   RIGHT/LEFT HEART CATH AND CORONARY ANGIOGRAPHY N/A 08/11/2018   Procedure: RIGHT/LEFT HEART CATH AND CORONARY ANGIOGRAPHY;  Surgeon: Burnell Blanks, MD;  Location: [place] [facility];  Service: Cardiovascular;  Laterality: N/A;   TEE WITHOUT CARDIOVERSION N/A 06/08/2018   Procedure: TRANSESOPHAGEAL ECHOCARDIOGRAM (TEE);  Surgeon: Buford Dresser, MD;  [facility];  Service: Cardiovascular;  Laterality: N/A;   TEE WITHOUT CARDIOVERSION N/A 09/15/2018   Procedure: TRANSESOPHAGEAL ECHOCARDIOGRAM (TEE);  Surgeon: Rexene Alberts, MD;  Location: [place];  Service: Open Heart  Surgery;  Laterality: N/A;   TYMPANOSTOMY TUBE PLACEMENT Bilateral "as a kid"   "had one done as an adult too"   WISDOM TOOTH EXTRACTION      reports that he has been smoking cigarettes. He has a 16.00 pack-year smoking history. He has never used smokeless tobacco. He reports current alcohol use of about 2.0 - 3.0 standard drinks of alcohol per week. He reports that he does not currently use drugs after having used the following drugs: Marijuana. family history includes Bradycardia in his father; Diverticulitis in his mother; Healthy in his father; Multiple sclerosis in his maternal grandfather; Skin cancer in his mother. Allergies  Allergen Reactions   Morphine And Related Itching   Current Outpatient Medications on File Prior to Visit  Medication Sig Dispense Refill   aspirin EC 81 MG tablet Take 81 mg by mouth daily.     atorvastatin (LIPITOR) 80 MG tablet Take 1 tablet (80 mg total) by mouth daily. 90 tablet 3   calcium carbonate (TUMS - DOSED IN MG ELEMENTAL CALCIUM) 500 MG chewable tablet Chew 2 tablets by mouth daily as needed for indigestion or heartburn.     fluticasone (FLONASE) 50 MCG/ACT nasal spray Place 1 spray into both nostrils daily as needed for allergies or rhinitis.     guaiFENesin (MUCINEX) 600 MG 12 hr tablet Take 2 tablets (1,200 mg total) by mouth 2 (two) times daily as needed. 60 tablet 1   metoprolol tartrate (LOPRESSOR) 25 MG tablet Take 1 tablet (25 mg total) by mouth 2 (two) times daily. 180 tablet 3   Multiple Vitamin (MULTI-VITAMIN PO) Take 1 tablet by mouth daily.     nitroGLYCERIN (NITROSTAT) 0.4 MG SL tablet Place 1 tablet (0.4 mg total) under the tongue every 5 (five) minutes x 3 doses as needed for chest pain. 25 tablet 3   pantoprazole (PROTONIX) 40 MG tablet TAKE 1 TABLET (40 MG TOTAL) BY MOUTH DAILY. 90 tablet 3   triamcinolone (NASACORT) 55 MCG/ACT AERO nasal inhaler Place 2 sprays into the nose daily. 1 each 12   cetirizine (ZYRTEC) 10 MG tablet Take 1  tablet (10 mg total) by mouth daily. (Patient not taking: Reported on 03/01/2022) 30 tablet 11   loratadine (CLARITIN) 10 MG tablet Take 10 mg by mouth daily. (Patient not taking: Reported on 03/01/2022)     No current facility-administered medications on file prior to visit.        ROS:  All others reviewed and negative.  Objective        PE:  BP 122/78 (BP Location: Right Arm, Patient Position: Sitting, Cuff Size: Large)   Pulse 81   Temp 98.1 F (36.7 C) (Oral)   Ht '5\' 11"'$  (1.803 m)   Wt 192 lb (87.1 kg)   SpO2 92%   BMI 26.78 kg/m                 Constitutional: Pt  appears in NAD               HENT: Head: NCAT.                Right Ear: External ear normal.                 Left Ear: External ear normal.                Eyes: . Pupils are equal, round, and reactive to light. Conjunctivae and EOM are normal               Nose: without d/c or deformity               Neck: Neck supple. Gross normal ROM               Cardiovascular: Normal rate and regular rhythm.                 Pulmonary/Chest: Effort normal and breath sounds without rales or wheezing.                Abd:  Soft, ND, + BS, no organomegaly, diffuse mild tender, no rebound, may be slightly bloated distended                 Neurological: Pt is alert. At baseline orientation, motor grossly intact               Skin: Skin is warm. No rashes, no other new lesions, LE edema - none               Psychiatric: Pt behavior is normal without agitation   Micro: none  Cardiac tracings I have personally interpreted today:  none  Pertinent Radiological findings (summarize): none   Lab Results  Component Value Date   WBC 9.0 03/01/2022   HGB 15.9 03/01/2022   HCT 46.5 03/01/2022   PLT 245.0 03/01/2022   GLUCOSE 93 03/01/2022   CHOL 134 06/04/2021   TRIG 64.0 06/04/2021   HDL 47.80 06/04/2021   LDLCALC 73 06/04/2021   ALT 22 03/01/2022   AST 25 03/01/2022   NA 139 03/01/2022   K 4.2 03/01/2022   CL 104 03/01/2022    CREATININE 1.11 03/01/2022   BUN 22 03/01/2022   CO2 28 03/01/2022   TSH 1.22 06/04/2021   PSA 0.84 06/04/2021   INR 1.4 (H) 09/15/2018   HGBA1C 5.9 06/04/2021   Assessment/Plan:  Isaac Patel is a 56 y.o. White or Caucasian [1] male with  has a past medical history of Aortic insufficiency, CAD (coronary artery disease), CHF (congestive heart failure) ([place]), Dilated aortic root ([place]), Dyspnea, GERD (gastroesophageal reflux disease), Headache, HLD (hyperlipidemia), Hypertension, NSTEMI (non-ST elevated myocardial infarction) ([place]) (10/28/2014), OSA (obstructive sleep apnea), and S/P aortic valve repair (09/15/2018).  Generalized abdominal pain I suspect this is related to constipation, has not tried laxative - urged for OTC Miralax daily start today, also labs including cbc, ua, and plain film though obstruction is less likely  Thoracic back pain With mild tenderness mid thoracic midline without swelling, or rash, I suspect this is MSK strain due to work with upper body, or referred pain, but also for lipase with labs and T spine films, and tramadol and flexeril as needed for pain  Pre-diabetes Lab Results  Component Value Date   HGBA1C 5.9 06/04/2021   Stable, pt to continue current medical treatment - diet wt control, and is unlikely to be assoc with abd  pain  Followup: Return in about 3 months (around 05/31/2022).  Cathlean Cower, MD 03/02/2022 1:05 PM [facility]

## 2022-03-01 NOTE — Patient Instructions (Addendum)
Please take all new medication as prescribed - the tramadol and muscle relaxer for pain  Please also take OTC Miralax dialy and stool softner as needed  Please continue all other medications as before, and refills have been done if requested.  Please have the pharmacy call with any other refills you may need.  Please continue your efforts at being more active, low cholesterol diet, and weight control.  Please keep your appointments with your specialists as you may have planned  Please go to the XRAY Department in the first floor for the x-ray testing  Please remember to sign up for MyChart if you have not done so, as this will be important to you in the future with finding out test results, communicating by private email, and scheduling acute appointments online when needed.  Please make an Appointment to return in 3 months, or sooner if needed

## 2022-03-02 ENCOUNTER — Encounter: Payer: Self-pay | Admitting: Internal Medicine

## 2022-03-02 LAB — URINALYSIS, ROUTINE W REFLEX MICROSCOPIC
Bilirubin Urine: NEGATIVE
Hgb urine dipstick: NEGATIVE
Ketones, ur: NEGATIVE
Leukocytes,Ua: NEGATIVE
Nitrite: NEGATIVE
RBC / HPF: NONE SEEN (ref 0–?)
Specific Gravity, Urine: 1.01 (ref 1.000–1.030)
Total Protein, Urine: NEGATIVE
Urine Glucose: NEGATIVE
Urobilinogen, UA: 0.2 (ref 0.0–1.0)
pH: 6 (ref 5.0–8.0)

## 2022-03-02 NOTE — Assessment & Plan Note (Addendum)
With mild tenderness mid thoracic midline without swelling, or rash, I suspect this is MSK strain due to work with upper body, or referred pain, but also for lipase with labs and T spine films, and tramadol and flexeril as needed for pain

## 2022-03-02 NOTE — Assessment & Plan Note (Signed)
Lab Results  Component Value Date   HGBA1C 5.9 06/04/2021   Stable, pt to continue current medical treatment - diet wt control, and is unlikely to be assoc with abd pain

## 2022-03-02 NOTE — Assessment & Plan Note (Signed)
I suspect this is related to constipation, has not tried laxative - urged for OTC Miralax daily start today, also labs including cbc, ua, and plain film though obstruction is less likely

## 2022-05-12 ENCOUNTER — Other Ambulatory Visit (HOSPITAL_BASED_OUTPATIENT_CLINIC_OR_DEPARTMENT_OTHER): Payer: Self-pay

## 2022-05-12 ENCOUNTER — Other Ambulatory Visit: Payer: Self-pay | Admitting: Cardiology

## 2022-05-13 ENCOUNTER — Other Ambulatory Visit (HOSPITAL_BASED_OUTPATIENT_CLINIC_OR_DEPARTMENT_OTHER): Payer: Self-pay

## 2022-05-13 MED ORDER — METOPROLOL TARTRATE 25 MG PO TABS
25.0000 mg | ORAL_TABLET | Freq: Two times a day (BID) | ORAL | 0 refills | Status: DC
  Filled 2022-05-13: qty 60, 30d supply, fill #0

## 2022-05-13 MED ORDER — ATORVASTATIN CALCIUM 80 MG PO TABS
80.0000 mg | ORAL_TABLET | Freq: Every day | ORAL | 0 refills | Status: DC
  Filled 2022-05-13: qty 30, 30d supply, fill #0

## 2022-05-20 ENCOUNTER — Other Ambulatory Visit (HOSPITAL_BASED_OUTPATIENT_CLINIC_OR_DEPARTMENT_OTHER): Payer: Self-pay

## 2022-05-31 ENCOUNTER — Ambulatory Visit: Payer: Commercial Managed Care - PPO | Admitting: Internal Medicine

## 2022-05-31 ENCOUNTER — Other Ambulatory Visit (HOSPITAL_BASED_OUTPATIENT_CLINIC_OR_DEPARTMENT_OTHER): Payer: Self-pay

## 2022-05-31 VITALS — BP 142/94 | HR 78 | Temp 97.7°F | Ht 71.0 in | Wt 204.0 lb

## 2022-05-31 DIAGNOSIS — Z125 Encounter for screening for malignant neoplasm of prostate: Secondary | ICD-10-CM

## 2022-05-31 DIAGNOSIS — Z720 Tobacco use: Secondary | ICD-10-CM | POA: Diagnosis not present

## 2022-05-31 DIAGNOSIS — E538 Deficiency of other specified B group vitamins: Secondary | ICD-10-CM

## 2022-05-31 DIAGNOSIS — R7303 Prediabetes: Secondary | ICD-10-CM | POA: Diagnosis not present

## 2022-05-31 DIAGNOSIS — E7800 Pure hypercholesterolemia, unspecified: Secondary | ICD-10-CM | POA: Diagnosis not present

## 2022-05-31 DIAGNOSIS — I10 Essential (primary) hypertension: Secondary | ICD-10-CM

## 2022-05-31 DIAGNOSIS — Z0001 Encounter for general adult medical examination with abnormal findings: Secondary | ICD-10-CM

## 2022-05-31 DIAGNOSIS — E559 Vitamin D deficiency, unspecified: Secondary | ICD-10-CM

## 2022-05-31 LAB — HEPATIC FUNCTION PANEL
ALT: 23 U/L (ref 0–53)
AST: 19 U/L (ref 0–37)
Albumin: 4 g/dL (ref 3.5–5.2)
Alkaline Phosphatase: 73 U/L (ref 39–117)
Bilirubin, Direct: 0.2 mg/dL (ref 0.0–0.3)
Total Bilirubin: 1.1 mg/dL (ref 0.2–1.2)
Total Protein: 6.5 g/dL (ref 6.0–8.3)

## 2022-05-31 LAB — BASIC METABOLIC PANEL
BUN: 18 mg/dL (ref 6–23)
CO2: 24 mEq/L (ref 19–32)
Calcium: 9.4 mg/dL (ref 8.4–10.5)
Chloride: 105 mEq/L (ref 96–112)
Creatinine, Ser: 0.95 mg/dL (ref 0.40–1.50)
GFR: 88.87 mL/min (ref >60.00)
Glucose, Bld: 85 mg/dL (ref 70–99)
Potassium: 4 mEq/L (ref 3.5–5.1)
Sodium: 139 mEq/L (ref 135–145)

## 2022-05-31 LAB — URINALYSIS, ROUTINE W REFLEX MICROSCOPIC
Bilirubin Urine: NEGATIVE
Ketones, ur: NEGATIVE
Leukocytes,Ua: NEGATIVE
Nitrite: NEGATIVE
Specific Gravity, Urine: 1.01 (ref 1.000–1.030)
Total Protein, Urine: NEGATIVE
Urine Glucose: NEGATIVE
Urobilinogen, UA: 0.2 (ref 0.0–1.0)
pH: 6 (ref 5.0–8.0)

## 2022-05-31 LAB — CBC WITH DIFFERENTIAL/PLATELET
Basophils Absolute: 0.1 10*3/uL (ref 0.0–0.1)
Basophils Relative: 0.6 % (ref 0.0–3.0)
Eosinophils Absolute: 0.3 10*3/uL (ref 0.0–0.7)
Eosinophils Relative: 4.1 % (ref 0.0–5.0)
HCT: 46.8 % (ref 39.0–52.0)
Hemoglobin: 16.2 g/dL (ref 13.0–17.0)
Lymphocytes Relative: 18.1 % (ref 12.0–46.0)
Lymphs Abs: 1.5 10*3/uL (ref 0.7–4.0)
MCHC: 34.6 g/dL (ref 30.0–36.0)
MCV: 95.8 fl (ref 78.0–100.0)
Monocytes Absolute: 0.7 10*3/uL (ref 0.1–1.0)
Monocytes Relative: 8 % (ref 3.0–12.0)
Neutro Abs: 5.8 10*3/uL (ref 1.4–7.7)
Neutrophils Relative %: 69.2 % (ref 43.0–77.0)
Platelets: 221 10*3/uL (ref 150.0–400.0)
RBC: 4.89 Mil/uL (ref 4.22–5.81)
RDW: 13 % (ref 11.5–15.5)
WBC: 8.4 10*3/uL (ref 4.0–10.5)

## 2022-05-31 LAB — LIPID PANEL
Cholesterol: 132 mg/dL (ref 0–200)
HDL: 49.8 mg/dL (ref >39.00)
LDL Cholesterol: 62 mg/dL (ref 0–99)
NonHDL: 81.93
Total CHOL/HDL Ratio: 3
Triglycerides: 101 mg/dL (ref 0.0–149.0)
VLDL: 20.2 mg/dL (ref 0.0–40.0)

## 2022-05-31 LAB — VITAMIN B12: Vitamin B-12: 372 pg/mL (ref 211–911)

## 2022-05-31 LAB — HEMOGLOBIN A1C: Hgb A1c MFr Bld: 6.1 % (ref 4.6–6.5)

## 2022-05-31 LAB — PSA: PSA: 1.02 ng/mL (ref 0.10–4.00)

## 2022-05-31 LAB — VITAMIN D 25 HYDROXY (VIT D DEFICIENCY, FRACTURES): VITD: 28.09 ng/mL — ABNORMAL LOW (ref 30.00–100.00)

## 2022-05-31 LAB — TSH: TSH: 1.56 u[IU]/mL (ref 0.35–5.50)

## 2022-05-31 MED ORDER — PANTOPRAZOLE SODIUM 40 MG PO TBEC
40.0000 mg | DELAYED_RELEASE_TABLET | Freq: Every day | ORAL | 3 refills | Status: DC
  Filled 2022-05-31: qty 90, 90d supply, fill #0
  Filled 2022-10-25: qty 90, 90d supply, fill #1
  Filled 2023-04-04: qty 90, 90d supply, fill #2

## 2022-05-31 NOTE — Progress Notes (Unsigned)
[name], male   DOB: 04-30-1965, 57 y.o.   MRN: [id number]         Chief Complaint:: wellness exam and htn, hld, preDM, low vit d, tobacco use       HPI:  Isaac Patel is a 57 y.o. male here for wellness exam; declines covid booster, o/w up to date                        Also BP at work almost daily - normal and controlled there.  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  Needs protonix 40 qd.  Pt denies chest pain, increased sob or doe, wheezing, orthopnea, PND, increased LE swelling, palpitations, dizziness or syncope.   Pt denies polydipsia, polyuria, or new focal neuro s/s.    Pt denies fever, wt loss, night sweats, loss of appetite, or other constitutional symptoms  Plans to be more active with recent wt gain.   Wt Readings from Last 3 Encounters:  05/31/22 204 lb (92.5 kg)  03/01/22 192 lb (87.1 kg)  10/12/21 192 lb 12.8 oz (87.5 kg)   BP Readings from Last 3 Encounters:  05/31/22 (!) 142/94  03/01/22 122/78  10/12/21 112/68   Immunization History  Administered Date(s) Administered   COVID-19, mRNA, vaccine(Comirnaty)12 years and older 12/21/2021   Influenza,inj,Quad PF,6+ Mos 12/27/2017   Influenza-Unspecified 01/06/2015, 12/19/2020, 01/20/2022   PFIZER(Purple Top)SARS-COV-2 Vaccination 05/13/2019, 05/30/2019   Tdap 09/21/2017   Zoster Recombinat (Shingrix) 12/06/2019, 04/30/2020  There are no preventive care reminders to display for this patient.    Past Medical History:  Diagnosis Date   Aortic insufficiency    Status post aortic valve repair // Echo 8/20: EF 50-55, moderate LVH, grade 1 diastolic dysfunction, normal RV SF, status post AV repair, mild AI, mild dilation of aortic root (38 mm)   CAD (coronary artery disease)    a.  Non-STEMI 8/16: LHC-proximal LAD 80% treated with a resolute DES, EF 50-55%;  b.  Echo 8/16:  Moderate LVH, EF 55-60%, normal wall motion, normal diastolic function, normal RV function   CHF (congestive heart  failure) ([place])    Dilated aortic root (HCC)    27m by echo 08/2020 and 41 mm by echo 08/2021 and 44 mm by chest CTA 09-2021   Dyspnea    mild   GERD (gastroesophageal reflux disease)    Headache    "maybe monthly" (10/28/2014)   HLD (hyperlipidemia)    Hypertension    NSTEMI (non-ST elevated [place] infarction) [place] 10/28/2014   OSA (obstructive sleep apnea)    "tried mask; wear it off and on" (10/28/2014)   S/P aortic valve repair 09/15/2018   Complex valvuloplasty including plication of prolapsing right coronary cusp with Biostable HAART 300 aortic annuloplasty, size 21 mm   Past Surgical History:  Procedure Laterality Date   AORTIC VALVE REPAIR N/A 09/15/2018   Procedure: AORTIC VALVE REPAIR USING HAART 300 AORTIC ANNULOPLASTY DEVICE SIZE 21MM;  Surgeon: ORexene Alberts MD;  [place] [facility]  Service: Open Heart Surgery;  Laterality: N/A;   ARM AMPUTATION THROUGH FOREARM Left 2009   traumatic injury   CARDIAC CATHETERIZATION N/A 10/29/2014   Procedure: Left Heart Cath and Coronary Angiography;  Surgeon: TTroy Sine MD;  [facility];  Service: Cardiovascular;  Laterality: N/A;   CHOLESTEATOMA EXCISION Right 1990's   LEFT HEART CATH AND CORONARY ANGIOGRAPHY N/A 05/25/2016   Procedure: Left Heart Cath and Coronary Angiography;  Surgeon: Sherren Mocha, MD;  Location: [place] [facility];  Service: Cardiovascular;  Laterality: N/A;   MULTIPLE TOOTH EXTRACTIONS  08/29/2018   NASAL SEPTOPLASTY W/ TURBINOPLASTY Bilateral 11/30/2017   Procedure: NASAL SEPTOPLASTY WITH TURBINATE REDUCTION;  Surgeon: Jerrell Belfast, MD;  Location: [place];  Service: ENT;  Laterality: Bilateral;   RIGHT/LEFT HEART CATH AND CORONARY ANGIOGRAPHY N/A 08/11/2018   Procedure: RIGHT/LEFT HEART CATH AND CORONARY ANGIOGRAPHY;  Surgeon: Burnell Blanks, MD;  Location: [place] [facility];  Service: Cardiovascular;  Laterality: N/A;   TEE WITHOUT CARDIOVERSION N/A 06/08/2018   Procedure:  TRANSESOPHAGEAL ECHOCARDIOGRAM (TEE);  Surgeon: [place] Dresser, MD;  Location: [facility] ENDOSCOPY;  Service: Cardiovascular;  Laterality: N/A;   TEE WITHOUT CARDIOVERSION N/A 09/15/2018   Procedure: TRANSESOPHAGEAL ECHOCARDIOGRAM (TEE);  Surgeon: Rexene Alberts, MD;  Location: [place];  Service: Open Heart Surgery;  Laterality: N/A;   TYMPANOSTOMY TUBE PLACEMENT Bilateral "as a kid"   "had one done as an adult too"   WISDOM TOOTH EXTRACTION      reports that he has been smoking cigarettes. He has a 16.00 pack-year smoking history. He has never used smokeless tobacco. He reports current alcohol use of about 2.0 - 3.0 standard drinks of alcohol per week. He reports that he does not currently use drugs after having used the following drugs: Marijuana. family history includes Bradycardia in his father; Diverticulitis in his mother; Healthy in his father; Multiple sclerosis in his maternal grandfather; Skin cancer in his mother. Allergies  Allergen Reactions   Morphine And Related Itching   Current Outpatient Medications on File Prior to Visit  Medication Sig Dispense Refill   aspirin EC 81 MG tablet Take 81 mg by mouth daily.     atorvastatin (LIPITOR) 80 MG tablet Take 1 tablet (80 mg total) by mouth daily. 30 tablet 0   calcium carbonate (TUMS - DOSED IN MG ELEMENTAL CALCIUM) 500 MG chewable tablet Chew 2 tablets by mouth daily as needed for indigestion or heartburn.     fluticasone (FLONASE) 50 MCG/ACT nasal spray Place 1 spray into both nostrils daily as needed for allergies or rhinitis.     lisinopril (ZESTRIL) 10 MG tablet Take by mouth.     metoprolol tartrate (LOPRESSOR) 25 MG tablet Take 1 tablet (25 mg total) by mouth 2 (two) times daily. 60 tablet 0   Multiple Vitamin (MULTI-VITAMIN PO) Take 1 tablet by mouth daily.     nitroGLYCERIN (NITROSTAT) 0.4 MG SL tablet Place 1 tablet (0.4 mg total) under the tongue every 5 (five) minutes x 3 doses as needed for chest pain. 25 tablet 3    Pediatric Multivit-Minerals (VITALETS CHILDRENS) CHEW Chew by mouth.     levocetirizine (XYZAL) 5 MG tablet      No current facility-administered medications on file prior to visit.        ROS:  All others reviewed and negative.  Objective        PE:  BP (!) 142/94 (BP Location: Right Arm, Patient Position: Sitting, Cuff Size: Large)   Pulse 78   Temp 97.7 F (36.5 C) (Oral)   Ht '5\' 11"'$  (1.803 m)   Wt 204 lb (92.5 kg)   SpO2 95%   BMI 28.45 kg/m                 Constitutional: Pt appears in NAD               HENT: Head: NCAT.  Right Ear: External ear normal.                 Left Ear: External ear normal.                Eyes: . Pupils are equal, round, and reactive to light. Conjunctivae and EOM are normal               Nose: without d/c or deformity               Neck: Neck supple. Gross normal ROM               Cardiovascular: Normal rate and regular rhythm.                 Pulmonary/Chest: Effort normal and breath sounds without rales or wheezing.                Abd:  Soft, NT, ND, + BS, no organomegaly               Neurological: Pt is alert. At baseline orientation, motor grossly intact               Skin: Skin is warm. No rashes, no other new lesions, LE edema - none               Psychiatric: Pt behavior is normal without agitation   Micro: none  Cardiac tracings I have personally interpreted today:  none  Pertinent Radiological findings (summarize): none   Lab Results  Component Value Date   WBC 8.4 05/31/2022   HGB 16.2 05/31/2022   HCT 46.8 05/31/2022   PLT 221.0 05/31/2022   GLUCOSE 85 05/31/2022   CHOL 132 05/31/2022   TRIG 101.0 05/31/2022   HDL 49.80 05/31/2022   LDLCALC 62 05/31/2022   ALT 23 05/31/2022   AST 19 05/31/2022   NA 139 05/31/2022   K 4.0 05/31/2022   CL 105 05/31/2022   CREATININE 0.95 05/31/2022   BUN 18 05/31/2022   CO2 24 05/31/2022   TSH 1.56 05/31/2022   PSA 1.02 05/31/2022   INR 1.4 (H) 09/15/2018   HGBA1C 6.1  05/31/2022   Assessment/Plan:  Isaac Patel is a 57 y.o. White or Caucasian [1] male with  has a past medical history of Aortic insufficiency, CAD (coronary artery disease), CHF (congestive heart failure) ([place]), Dilated aortic root ([place]), Dyspnea, GERD (gastroesophageal reflux disease), Headache, HLD (hyperlipidemia), Hypertension, NSTEMI (non-ST elevated myocardial infarction) ([place]) (10/28/2014), OSA (obstructive sleep apnea), and S/P aortic valve repair (09/15/2018).  Encounter for well adult exam with abnormal findings Age and sex appropriate education and counseling updated with regular exercise and diet Referrals for preventative services - none needed Immunizations addressed - declines covid booster Smoking counseling  - pt counseled to quit, pt not ready Evidence for depression or other mood disorder - none significant Most recent labs reviewed. I have personally reviewed and have noted: 1) the patient's medical and social history 2) The patient's current medications and supplements 3) The patient's height, weight, and BMI have been recorded in the chart   Essential hypertension BP Readings from Last 3 Encounters:  05/31/22 (!) 142/94  03/01/22 122/78  10/12/21 112/68   Uncontrolled, likely white coat reactive today, pt to continue medical treatment lisinopril 10 mg qd, lopressor 25 bid   HLD (hyperlipidemia) Lab Results  Component Value Date   LDLCALC 62 05/31/2022   Stable, pt to continue current statin lipitor 80 mg qd  Pre-diabetes Lab Results  Component Value Date   HGBA1C 6.1 05/31/2022   Stable, pt to continue current medical treatment  - diet , wt control   Vitamin D deficiency Last vitamin D Lab Results  Component Value Date   VD25OH 28.09 (L) 05/31/2022   Low, to start oral replacement   Tobacco abuse Pt counsled to quit, pt not ready  Followup: Return in about 1 year (around 05/31/2023).  Cathlean Cower, MD 06/02/2022 9:06 PM [facility]

## 2022-05-31 NOTE — Patient Instructions (Signed)

## 2022-06-02 ENCOUNTER — Encounter: Payer: Self-pay | Admitting: Internal Medicine

## 2022-06-02 NOTE — Assessment & Plan Note (Signed)
Pt counsled to quit, pt not ready °

## 2022-06-02 NOTE — Assessment & Plan Note (Signed)
Last vitamin D Lab Results  Component Value Date   VD25OH 28.09 (L) 05/31/2022   Low, to start oral replacement

## 2022-06-02 NOTE — Assessment & Plan Note (Signed)
Lab Results  Component Value Date   LDLCALC 62 05/31/2022   Stable, pt to continue current statin lipitor 80 mg qd

## 2022-06-02 NOTE — Assessment & Plan Note (Signed)
Lab Results  Component Value Date   HGBA1C 6.1 05/31/2022   Stable, pt to continue current medical treatment  - diet , wt control

## 2022-06-02 NOTE — Assessment & Plan Note (Signed)
Age and sex appropriate education and counseling updated with regular exercise and diet Referrals for preventative services - none needed Immunizations addressed - declines covid booster Smoking counseling  - pt counseled to quit, pt not ready Evidence for depression or other mood disorder - none significant Most recent labs reviewed. I have personally reviewed and have noted: 1) the patient's medical and social history 2) The patient's current medications and supplements 3) The patient's height, weight, and BMI have been recorded in the chart

## 2022-06-02 NOTE — Assessment & Plan Note (Signed)
BP Readings from Last 3 Encounters:  05/31/22 (!) 142/94  03/01/22 122/78  10/12/21 112/68   Uncontrolled, likely white coat reactive today, pt to continue medical treatment lisinopril 10 mg qd, lopressor 25 bid

## 2022-06-17 ENCOUNTER — Telehealth: Payer: Self-pay | Admitting: Cardiology

## 2022-06-17 ENCOUNTER — Other Ambulatory Visit (HOSPITAL_BASED_OUTPATIENT_CLINIC_OR_DEPARTMENT_OTHER): Payer: Self-pay

## 2022-06-17 MED ORDER — ATORVASTATIN CALCIUM 80 MG PO TABS
80.0000 mg | ORAL_TABLET | Freq: Every day | ORAL | 0 refills | Status: DC
  Filled 2022-06-17: qty 90, 90d supply, fill #0

## 2022-06-17 MED ORDER — METOPROLOL TARTRATE 25 MG PO TABS
25.0000 mg | ORAL_TABLET | Freq: Two times a day (BID) | ORAL | 0 refills | Status: DC
  Filled 2022-06-17: qty 180, 90d supply, fill #0

## 2022-06-17 NOTE — Telephone Encounter (Signed)
*  STAT* If patient is at the pharmacy, call can be transferred to refill team.   1. Which medications need to be refilled? (please list name of each medication and dose if known)   atorvastatin (LIPITOR) 80 MG tablet  metoprolol tartrate (LOPRESSOR) 25 MG tablet   2. Which pharmacy/location (including street and city if local pharmacy) is medication to be sent to?  [facility]   3. Do they need a 30 day or 90 day supply?   90 day  Patient still has some medication left.  Patient has appointment scheduled on 5/7.

## 2022-06-17 NOTE — Telephone Encounter (Signed)
Pt's medications were sent to pt's pharmacy as requested. Confirmation received.  

## 2022-07-19 DIAGNOSIS — H903 Sensorineural hearing loss, bilateral: Secondary | ICD-10-CM | POA: Diagnosis not present

## 2022-07-19 DIAGNOSIS — H6123 Impacted cerumen, bilateral: Secondary | ICD-10-CM | POA: Diagnosis not present

## 2022-07-19 DIAGNOSIS — H6983 Other specified disorders of Eustachian tube, bilateral: Secondary | ICD-10-CM | POA: Diagnosis not present

## 2022-08-03 ENCOUNTER — Encounter: Payer: Self-pay | Admitting: Physician Assistant

## 2022-08-03 ENCOUNTER — Ambulatory Visit: Payer: Commercial Managed Care - PPO | Attending: Physician Assistant | Admitting: Physician Assistant

## 2022-08-03 VITALS — BP 110/80 | HR 75 | Ht 71.0 in | Wt 200.0 lb

## 2022-08-03 DIAGNOSIS — I351 Nonrheumatic aortic (valve) insufficiency: Secondary | ICD-10-CM

## 2022-08-03 DIAGNOSIS — E7800 Pure hypercholesterolemia, unspecified: Secondary | ICD-10-CM | POA: Diagnosis not present

## 2022-08-03 DIAGNOSIS — I10 Essential (primary) hypertension: Secondary | ICD-10-CM | POA: Diagnosis not present

## 2022-08-03 DIAGNOSIS — I77810 Thoracic aortic ectasia: Secondary | ICD-10-CM

## 2022-08-03 DIAGNOSIS — I2510 Atherosclerotic heart disease of native coronary artery without angina pectoris: Secondary | ICD-10-CM

## 2022-08-03 NOTE — Patient Instructions (Signed)
Medication Instructions:  Your physician recommends that you continue on your current medications as directed. Please refer to the Current Medication list given to you today.  *If you need a refill on your cardiac medications before your next appointment, please call your pharmacy*   Testing/Procedures: Your physician has requested that you have an echocardiogram. Echocardiography is a painless test that uses sound waves to create images of your heart. It provides your doctor with information about the size and shape of your heart and how well your heart's chambers and valves are working. This procedure takes approximately one hour. There are no restrictions for this procedure. Please do NOT wear cologne, perfume, aftershave, or lotions (deodorant is allowed). Please arrive 15 minutes prior to your appointment time.    Follow-Up: At [facility], you and your health needs are our priority.  As part of our continuing mission to provide you with exceptional heart care, we have created designated Provider Care Teams.  These Care Teams include your primary Cardiologist (physician) and Advanced Practice Providers (APPs -  Physician Assistants and Nurse Practitioners) who all work together to provide you with the care you need, when you need it.    Your next appointment:   1 year(s)  Provider:   Armanda Magic, MD  or Tereso Newcomer, PA

## 2022-08-03 NOTE — Assessment & Plan Note (Signed)
41 mm on TTE in 08/2021 and 44 mm on CT in 09/2021. Obtain f/u echocardiogram in 08/2022. BP is well controlled.

## 2022-08-03 NOTE — Assessment & Plan Note (Signed)
S/p NSTEMI in 2016 tx with DES to the pLAD. Cath in 2020 demonstrated patent LAD stent. He is not having chest pain to suggest angina. Continue ASA 81 mg once daily, Atorvastatin 80 mg once daily, Metoprolol tartrate 25 mg twice daily. F/u 1 year.

## 2022-08-03 NOTE — Assessment & Plan Note (Signed)
Well controlled. Continue metoprolol tartrate 25 mg twice daily.

## 2022-08-03 NOTE — Progress Notes (Signed)
Cardiology Office Note:    Date:  08/03/2022  ID:  Isaac Patel, DOB March 22, 1966, MRN [id number] PCP: Corwin Levins, MD  [facility] Providers Cardiologist:  Armanda Magic, MD          Patient Profile:   Coronary artery disease  S/p NSTEMI 10/2014 >> PCI:  3 x 15 mm resolute DES to prox LAD LHC 2018; 08/11/2018 - Patent LAD stent Aortic insufficiency S/p Aortic Valve repair 08/2018 (HAART Aortic Ring Annuloplasty size 21 mm) TTE 11/14/2018: EF 50-55, mild AI, mean 11, aortic root 38 mm TTE 09/10/19: EF 50-55, AV repair w mild AI, mean 8, Ao root 38 mm TTE 09/09/2020: EF 50-55, AV repair with mild AI, mean gradient 8, aortic root 38, TTE 09/16/2021: EF 50-55, no RWMA, mild LVH, GR 1 DD, normal RVSF, RVSP 18.5, trivial MR, AV repair with mild to moderate AI, mean gradient 11, aortic root 41, RAP 3 Aortic root dilation Chest CTA 10/12/2021: Aortic root 44 mm, ascending thoracic aorta 34 mm; no evidence of thoracic aortic aneurysm; subcentimeter enhancing lesion dome of right lobe of liver (perfusion anomaly versus flash filling hemangioma) Carotid artery disease Carotid US 09/13/2018: bilateral ICA 1-39  Hypertension Hyperlipidemia OSA Traumatic amputation of L upper ext  Cervical DDD  Tobacco use      History of Present Illness:   Isaac Patel is a 57 y.o. male who returns for follow-up of CAD, prior aortic valve repair and aortic root dilation.  He was last seen by Dr. 04/13/2021.  Echo in June 2023 demonstrated mild to moderate AI and aortic root 41 mm.  Follow-up chest CT demonstrated aortic root 44 mm and no evidence of thoracic aortic aneurysm. He is here alone. He now works at [employer]. His daughter is graduating from [employer] this year and plans to go to [employer] in the fall. He has been doing well w/o chest pain, shortness of breath, syncope, edema.   Review of Systems  Gastrointestinal:  Negative for hematochezia and melena.  Genitourinary:   Negative for hematuria.   See HPI    Studies Reviewed:    EKG:  normal sinus rhythm, HR 75, normal axis, no STTW changes, QTc 428 ms, no change from prior EKG  Risk Assessment/Calculations:             Physical Exam:   VS:  BP 110/80   Pulse 75   Ht 5\' 11"  (1.803 m)   Wt 200 lb (90.7 kg)   SpO2 97%   BMI 27.89 kg/m    Wt Readings from Last 3 Encounters:  08/03/22 200 lb (90.7 kg)  05/31/22 204 lb (92.5 kg)  03/01/22 192 lb (87.1 kg)    Constitutional:      Appearance: Healthy appearance. Not in distress.  Neck:     Vascular: No carotid bruit. JVD normal.  Pulmonary:     Breath sounds: Normal breath sounds. No wheezing. No rales.  Cardiovascular:     Normal rate. Regular rhythm.     Murmurs: There is no murmur.  Edema:    Peripheral edema absent.  Abdominal:     Palpations: Abdomen is soft.       ASSESSMENT AND PLAN:   Severe aortic insufficiency S/p AV repair in 2020. Echocardiogram in 2023 with mild to mod AI and aortic root 41 mm. Continue SBE prophylaxis. He is due for f/u echocardiogram in 08/2022.   CAD (coronary artery disease) S/p NSTEMI in 2016 tx with DES to  the pLAD. Cath in 2020 demonstrated patent LAD stent. He is not having chest pain to suggest angina. Continue ASA 81 mg once daily, Atorvastatin 80 mg once daily, Metoprolol tartrate 25 mg twice daily. F/u 1 year.   Dilated aortic root (HCC) 41 mm on TTE in 08/2021 and 44 mm on CT in 09/2021. Obtain f/u echocardiogram in 08/2022. BP is well controlled.  Essential hypertension Well controlled. Continue metoprolol tartrate 25 mg twice daily.  HLD (hyperlipidemia) Reviewed labs from primary care. LDL optimal at 62 in March 2024. Continue Atorvastatin 80 mg once daily.        Dispo:  Return in about 1 year (around 08/03/2023) for Routine Follow Up w/ Dr. Mayford Knife, or Tereso Newcomer, PA-C.  Signed, Tereso Newcomer, PA-C

## 2022-08-03 NOTE — Assessment & Plan Note (Signed)
Reviewed labs from primary care. LDL optimal at 62 in March 2024. Continue Atorvastatin 80 mg once daily.

## 2022-08-03 NOTE — Assessment & Plan Note (Signed)
S/p AV repair in 2020. Echocardiogram in 2023 with mild to mod AI and aortic root 41 mm. Continue SBE prophylaxis. He is due for f/u echocardiogram in 08/2022.

## 2022-09-06 ENCOUNTER — Ambulatory Visit (HOSPITAL_COMMUNITY): Payer: Commercial Managed Care - PPO | Attending: Internal Medicine

## 2022-09-06 DIAGNOSIS — I351 Nonrheumatic aortic (valve) insufficiency: Secondary | ICD-10-CM

## 2022-09-06 LAB — ECHOCARDIOGRAM COMPLETE
AV Mean grad: 15 mmHg
AV Peak grad: 27.2 mmHg
Ao pk vel: 2.61 m/s
Area-P 1/2: 4.23 cm2
P 1/2 time: 598 msec
S' Lateral: 3.2 cm

## 2022-09-08 ENCOUNTER — Telehealth: Payer: Self-pay | Admitting: *Deleted

## 2022-09-08 DIAGNOSIS — I351 Nonrheumatic aortic (valve) insufficiency: Secondary | ICD-10-CM

## 2022-09-08 NOTE — Telephone Encounter (Signed)
-----   Message from Beatrice Lecher, [place] sent at 09/08/2022 11:30 AM EDT ----- Results sent to Angela Nevin via MyChart. See MyChart comments below. I will send a copy to PCP as FYI PLAN:  -Repeat echo 1 year (Dx: S/p AV repair, aortic insufficiency)  Mr. Kotowski  Your echocardiogram demonstrates normal ejection fraction (heart function).  Your aortic valve repair remains stable with mild to moderate leakage (aortic insufficiency).  Continue current medications.  Will repeat your study again in 1 year. Tereso Newcomer, PA-C

## 2022-10-25 ENCOUNTER — Other Ambulatory Visit: Payer: Self-pay | Admitting: Internal Medicine

## 2022-10-25 ENCOUNTER — Other Ambulatory Visit: Payer: Self-pay

## 2022-10-25 ENCOUNTER — Other Ambulatory Visit (HOSPITAL_BASED_OUTPATIENT_CLINIC_OR_DEPARTMENT_OTHER): Payer: Self-pay

## 2022-10-25 ENCOUNTER — Other Ambulatory Visit: Payer: Self-pay | Admitting: Cardiology

## 2022-10-25 MED ORDER — ATORVASTATIN CALCIUM 80 MG PO TABS
80.0000 mg | ORAL_TABLET | Freq: Every day | ORAL | 3 refills | Status: DC
  Filled 2022-10-25: qty 90, 90d supply, fill #0
  Filled 2023-04-04: qty 90, 90d supply, fill #1
  Filled 2023-07-27: qty 90, 90d supply, fill #2

## 2022-10-25 MED ORDER — LEVOCETIRIZINE DIHYDROCHLORIDE 5 MG PO TABS
5.0000 mg | ORAL_TABLET | Freq: Every evening | ORAL | 2 refills | Status: AC
  Filled 2022-10-25: qty 90, 90d supply, fill #0
  Filled 2023-04-04: qty 90, 90d supply, fill #1

## 2022-10-25 MED ORDER — METOPROLOL TARTRATE 25 MG PO TABS
25.0000 mg | ORAL_TABLET | Freq: Two times a day (BID) | ORAL | 3 refills | Status: DC
  Filled 2022-10-25: qty 180, 90d supply, fill #0
  Filled 2023-04-04: qty 180, 90d supply, fill #1
  Filled 2023-07-27: qty 180, 90d supply, fill #2

## 2022-12-26 ENCOUNTER — Emergency Department (HOSPITAL_BASED_OUTPATIENT_CLINIC_OR_DEPARTMENT_OTHER)
Admission: EM | Admit: 2022-12-26 | Discharge: 2022-12-27 | Disposition: A | Discharge status: Home / Self Care | Payer: Commercial Managed Care - PPO | Attending: Emergency Medicine | Admitting: Emergency Medicine

## 2022-12-26 ENCOUNTER — Emergency Department (HOSPITAL_BASED_OUTPATIENT_CLINIC_OR_DEPARTMENT_OTHER): Payer: Commercial Managed Care - PPO

## 2022-12-26 ENCOUNTER — Encounter (HOSPITAL_BASED_OUTPATIENT_CLINIC_OR_DEPARTMENT_OTHER): Payer: Self-pay | Admitting: Emergency Medicine

## 2022-12-26 ENCOUNTER — Other Ambulatory Visit: Payer: Self-pay

## 2022-12-26 DIAGNOSIS — R0789 Other chest pain: Secondary | ICD-10-CM | POA: Diagnosis not present

## 2022-12-26 DIAGNOSIS — R110 Nausea: Secondary | ICD-10-CM | POA: Diagnosis not present

## 2022-12-26 DIAGNOSIS — R519 Headache, unspecified: Secondary | ICD-10-CM | POA: Diagnosis not present

## 2022-12-26 DIAGNOSIS — I110 Hypertensive heart disease with heart failure: Secondary | ICD-10-CM | POA: Insufficient documentation

## 2022-12-26 DIAGNOSIS — R079 Chest pain, unspecified: Secondary | ICD-10-CM | POA: Diagnosis not present

## 2022-12-26 DIAGNOSIS — Z79899 Other long term (current) drug therapy: Secondary | ICD-10-CM | POA: Insufficient documentation

## 2022-12-26 DIAGNOSIS — I509 Heart failure, unspecified: Secondary | ICD-10-CM | POA: Insufficient documentation

## 2022-12-26 DIAGNOSIS — I2510 Atherosclerotic heart disease of native coronary artery without angina pectoris: Secondary | ICD-10-CM | POA: Diagnosis not present

## 2022-12-26 DIAGNOSIS — Z7982 Long term (current) use of aspirin: Secondary | ICD-10-CM | POA: Diagnosis not present

## 2022-12-26 DIAGNOSIS — F419 Anxiety disorder, unspecified: Secondary | ICD-10-CM | POA: Diagnosis not present

## 2022-12-26 LAB — BASIC METABOLIC PANEL
Anion gap: 8 (ref 5–15)
BUN: 19 mg/dL (ref 6–20)
CO2: 28 mmol/L (ref 22–32)
Calcium: 9.3 mg/dL (ref 8.9–10.3)
Chloride: 104 mmol/L (ref 98–111)
Creatinine, Ser: 0.99 mg/dL (ref 0.61–1.24)
GFR, Estimated: >60 mL/min (ref >60)
Glucose, Bld: 103 mg/dL — ABNORMAL HIGH (ref 70–99)
Potassium: 4.2 mmol/L (ref 3.5–5.1)
Sodium: 140 mmol/L (ref 135–145)

## 2022-12-26 LAB — CBC
HCT: 46.7 % (ref 39.0–52.0)
Hemoglobin: 16 g/dL (ref 13.0–17.0)
MCH: 32.9 pg (ref 26.0–34.0)
MCHC: 34.3 g/dL (ref 30.0–36.0)
MCV: 95.9 fL (ref 80.0–100.0)
Platelets: 212 10*3/uL (ref 150–400)
RBC: 4.87 MIL/uL (ref 4.22–5.81)
RDW: 13.1 % (ref 11.5–15.5)
WBC: 6.8 10*3/uL (ref 4.0–10.5)
nRBC: 0 % (ref 0.0–0.2)

## 2022-12-26 LAB — TROPONIN I (HIGH SENSITIVITY): Troponin I (High Sensitivity): 8 ng/L (ref <18)

## 2022-12-26 NOTE — ED Triage Notes (Signed)
Started this afternoon. Feeling jittery, getting worse. Feels tightness in chest into jaw.  Reports high bp at home No sob, some nausea, headache

## 2022-12-26 NOTE — ED Notes (Signed)
Patient reports he started feeling "jittery" while doing house work. Patient reports his BP was elevated at home. Patient reports taking metoprolol twice a day. Patient reports hx of open heart surgery June 2020.

## 2022-12-27 LAB — TROPONIN I (HIGH SENSITIVITY): Troponin I (High Sensitivity): 9 ng/L (ref <18)

## 2022-12-27 NOTE — ED Provider Notes (Signed)
[place] [facility] Provider Note   CSN: [id number] Arrival date & time: 12/26/22  1738     History  No chief complaint on file.   Isaac Patel is a 57 y.o. male.  HPI     This is a 57 year old male who presents with jittery feeling.  Patient reports he felt jittery at home.  He took his blood pressure and noted it to be elevated.  States he has felt some tightness across his chest and this concerned him.  Tightness was not exertional.  No recent illnesses, fever, shortness of breath.  No known history of diabetes but does have a heart history.  Reports good hydration and no changes in diet.  Home Medications Prior to Admission medications   Medication Sig Start Date End Date Taking? Authorizing Provider  amoxicillin (AMOXIL) 500 MG capsule Take 500 mg by mouth as needed (for dental). 06/30/22   [provider]  aspirin EC 81 MG tablet Take 81 mg by mouth daily.    [provider]  atorvastatin (LIPITOR) 80 MG tablet Take 1 tablet (80 mg total) by mouth daily. 10/25/22   Quintella Reichert, MD  calcium carbonate (TUMS - DOSED IN MG ELEMENTAL CALCIUM) 500 MG chewable tablet Chew 2 tablets by mouth daily as needed for indigestion or heartburn.    [provider]  fluticasone (FLONASE) 50 MCG/ACT nasal spray Place 1 spray into both nostrils daily as needed for allergies or rhinitis.    [provider]  levocetirizine (XYZAL) 5 MG tablet Take 1 tablet (5 mg total) by mouth every evening. 10/25/22   Corwin Levins, MD  metoprolol tartrate (LOPRESSOR) 25 MG tablet Take 1 tablet (25 mg total) by mouth 2 (two) times daily. 10/25/22   Quintella Reichert, MD  Multiple Vitamin (MULTI-VITAMIN PO) Take 1 tablet by mouth daily.    [provider]  nitroGLYCERIN (NITROSTAT) 0.4 MG SL tablet Place 1 tablet (0.4 mg total) under the tongue every 5 (five) minutes x 3 doses as needed for chest pain. 01/03/19   Tereso Newcomer T, PA-C   pantoprazole (PROTONIX) 40 MG tablet Take 1 tablet (40 mg total) by mouth daily. 05/31/22   Corwin Levins, MD      Allergies    Morphine and codeine    Review of Systems   Review of Systems  Constitutional:  Negative for fever.  Respiratory:  Negative for shortness of breath.   Cardiovascular:  Positive for chest pain.  All other systems reviewed and are negative.   Physical Exam Updated Vital Signs BP (!) 141/89 (BP Location: Right Arm)   Pulse (!) 58   Temp 98.1 F (36.7 C) (Oral)   Resp 16   SpO2 98%  Physical Exam Vitals and nursing note reviewed.  Constitutional:      Appearance: He is well-developed. He is obese. He is not ill-appearing.  HENT:     Head: Normocephalic and atraumatic.  Eyes:     Pupils: Pupils are equal, round, and reactive to light.  Cardiovascular:     Rate and Rhythm: Normal rate and regular rhythm.     Heart sounds: Normal heart sounds. No murmur heard. Pulmonary:     Effort: Pulmonary effort is normal. No respiratory distress.     Breath sounds: Normal breath sounds. No wheezing.  Abdominal:     Palpations: Abdomen is soft.     Tenderness: There is no abdominal tenderness. There is no rebound.  Musculoskeletal:  Cervical back: Neck supple.  Lymphadenopathy:     Cervical: No cervical adenopathy.  Skin:    General: Skin is warm and dry.  Neurological:     Mental Status: He is alert and oriented to person, place, and time.  Psychiatric:     Comments: Anxious appearing     ED Results / Procedures / Treatments   Labs (all labs ordered are listed, but only abnormal results are displayed) Labs Reviewed  BASIC METABOLIC PANEL - Abnormal; Notable for the following components:      Result Value   Glucose, Bld 103 (*)    All other components within normal limits  CBC  TROPONIN I (HIGH SENSITIVITY)  TROPONIN I (HIGH SENSITIVITY)    EKG EKG Interpretation Date/Time:  Sunday December 26 2022 18:19:29 EDT Ventricular Rate:  72 PR  Interval:  148 QRS Duration:  90 QT Interval:  390 QTC Calculation: 427 R Axis:   31  Text Interpretation: Normal sinus rhythm Normal ECG When compared with ECG of 27-Apr-2020 22:52, No significant change was found Confirmed by Ross Marcus ([id number]) on 12/26/2022 10:47:21 PM  Radiology DG Chest Port 1 View  Result Date: 12/26/2022 CLINICAL DATA:  Chest pain since this afternoon. Tightness of the chest and jaw. High blood pressure at home. Nausea and headache. EXAM: PORTABLE CHEST 1 VIEW COMPARISON:  10/12/2021 FINDINGS: Postoperative changes in the mediastinum. Heart size and pulmonary vascularity are normal. Lungs are clear. No pleural effusions. No pneumothorax. Mediastinal contours appear intact. IMPRESSION: No active disease. Electronically Signed   By: Burman Nieves M.D.   On: 12/26/2022 20:30    Procedures Procedures    Medications Ordered in ED Medications - No data to display  ED Course/ Medical Decision Making/ A&P                                 Medical Decision Making Amount and/or Complexity of Data Reviewed Labs: ordered.   This patient presents to the ED for concern of jitteriness, chest pain, this involves an extensive number of treatment options, and is a complaint that carries with it a high risk of complications and morbidity.  I considered the following differential and admission for this acute, potentially life threatening condition.  The differential diagnosis includes ACS, PE, pneumothorax, pneumonia, arrhythmia, hypoglycemia  MDM:    This is a 57 year old male who presents with jitteriness and chest pain.  He is nontoxic.  Vital signs notable for blood pressure of 141/89.  EKG shows no evidence of acute ischemia or arrhythmia.  Troponin x 2 negative.  Doubt primary ACS although given risk factors would have him follow back up with cardiology.  Chest x-ray without pneumothorax or pneumonia.  Basic lab work and metabolic panels are reassuring.  (Labs,  imaging, consults)  Labs: I Ordered, and personally interpreted labs.  The pertinent results include: CBC, BMP, troponin x 2  Imaging Studies ordered: I ordered imaging studies including chest x-ray I independently visualized and interpreted imaging. I agree with the radiologist interpretation  Additional history obtained from chart review.  External records from outside source obtained and reviewed including prior evaluations  Cardiac Monitoring: The patient was maintained on a cardiac monitor.  If on the cardiac monitor, I personally viewed and interpreted the cardiac monitored which showed an underlying rhythm of: Sinus rhythm  Reevaluation: After the interventions noted above, I reevaluated the patient and found that they have :improved  Social  Determinants of Health:  lives independently  Disposition: Discharge  Co morbidities that complicate the patient evaluation  Past Medical History:  Diagnosis Date   Aortic insufficiency    Status post aortic valve repair // Echo 8/20: EF 50-55, moderate LVH, grade 1 diastolic dysfunction, normal RV SF, status post AV repair, mild AI, mild dilation of aortic root (38 mm)   CAD (coronary artery disease)    a.  Non-STEMI 8/16: LHC-proximal LAD 80% treated with a resolute DES, EF 50-55%;  b.  Echo 8/16:  Moderate LVH, EF 55-60%, normal wall motion, normal diastolic function, normal RV function   CHF (congestive heart failure) (HCC)    Dilated aortic root (HCC)    38mm by echo 08/2020 and 41 mm by echo 08/2021 and 44 mm by chest CTA 09-2021   Dyspnea    mild   GERD (gastroesophageal reflux disease)    Headache    "maybe monthly" (10/28/2014)   HLD (hyperlipidemia)    Hypertension    NSTEMI (non-ST elevated myocardial infarction) (HCC) 10/28/2014   OSA (obstructive sleep apnea)    "tried mask; wear it off and on" (10/28/2014)   S/P aortic valve repair 09/15/2018   Complex valvuloplasty including plication of prolapsing right coronary cusp  with Biostable HAART 300 aortic annuloplasty, size 21 mm     Medicines No orders of the defined types were placed in this encounter.   I have reviewed the patients home medicines and have made adjustments as needed  Problem List / ED Course: Problem List Items Addressed This Visit   None Visit Diagnoses     Atypical chest pain    -  Primary   Relevant Orders   Ambulatory referral to Cardiology                   Final Clinical Impression(s) / ED Diagnoses Final diagnoses:  Atypical chest pain    Rx / DC Orders ED Discharge Orders          Ordered    Ambulatory referral to Cardiology        12/27/22 0225              Shon Baton, MD 12/27/22 [phone]

## 2022-12-27 NOTE — Discharge Instructions (Addendum)
You were seen today for chest pain and jitteriness.  Your workup today is reassuring.  You will be referred to cardiology for further workup.

## 2023-01-10 ENCOUNTER — Ambulatory Visit: Payer: Commercial Managed Care - PPO | Attending: Cardiology | Admitting: Cardiology

## 2023-01-10 ENCOUNTER — Encounter: Payer: Self-pay | Admitting: Cardiology

## 2023-01-10 VITALS — BP 124/84 | HR 78 | Ht 71.0 in | Wt 200.8 lb

## 2023-01-10 DIAGNOSIS — E7800 Pure hypercholesterolemia, unspecified: Secondary | ICD-10-CM

## 2023-01-10 DIAGNOSIS — Z9889 Other specified postprocedural states: Secondary | ICD-10-CM | POA: Diagnosis not present

## 2023-01-10 DIAGNOSIS — I77810 Thoracic aortic ectasia: Secondary | ICD-10-CM | POA: Diagnosis not present

## 2023-01-10 DIAGNOSIS — I10 Essential (primary) hypertension: Secondary | ICD-10-CM

## 2023-01-10 DIAGNOSIS — I2510 Atherosclerotic heart disease of native coronary artery without angina pectoris: Secondary | ICD-10-CM | POA: Diagnosis not present

## 2023-01-10 NOTE — Progress Notes (Signed)
Cardiology Office Note:   Date:  01/10/2023  ID:  Angela Nevin, DOB April 18, 1965, MRN [id number] PCP: Corwin Levins, MD  [facility] Providers Cardiologist:  Armanda Magic, MD    History of Present Illness:   Discussed the use of AI scribe software for clinical note transcription with the patient, who gave verbal consent to proceed.  History of Present Illness   Mr. Schill, a 57 year old individual with a history of coronary artery disease, aortic insufficiency, hypertension, hyperlipidemia, obstructive sleep apnea, cervical degenerative disc disease, and tobacco use, presented to the emergency department on December 26, 2022, with symptoms of feeling jittery and chest tightness. The patient had noted elevated blood pressure at home, which prompted the visit. EKG in the ED showed no evidence of acute ischemia or arrhythmia, and two high sensitivity troponins were negative.  Since the emergency department visit, the patient has not experienced a recurrence of the jittery feeling or chest tightness. He has been monitoring his blood pressure daily at work, where he has access to professional blood pressure machines, and the readings have been consistently around 120s/80s.  The patient has not noticed any change in his exertional tolerance or any shortness of breath. He continues to engage in yard work without experiencing chest pain or tightness.   The patient also reported some recent family stress related to his wife's father having a stroke, wondered if this contributed to his episode of high blood pressure. He is considering replacing his home blood pressure machine, which is about ten years old, due to concerns about its reliability.      Since his ED visit, patient denies chest pain, shortness of breath, lower extremity edema, fatigue, palpitations, melena, hematuria, hemoptysis, diaphoresis, weakness, presyncope, syncope, orthopnea, and PND.   Studies Reviewed:    09/06/22  TTE  IMPRESSIONS     1. Left ventricular ejection fraction, by estimation, is 55 to 60%. The  left ventricle has normal function. The left ventricle has no regional  wall motion abnormalities. There is mild concentric left ventricular  hypertrophy. Left ventricular diastolic  parameters were normal.   2. Right ventricular systolic function is normal. The right ventricular  size is normal.   3. The mitral valve is normal in structure. No evidence of mitral valve  regurgitation. No evidence of mitral stenosis.   4. The aortic valve has been repaired/replaced (annuloplasty and RCC  repair). Aortic valve regurgitation is mild to moderate. No aortic  stenosis is present. There is a valve present in the aortic position.  Aortic regurgitation PHT measures 598 msec.  Aortic valve mean gradient measures 15.0 mmHg.   5. The inferior vena cava is normal in size with greater than 50%  respiratory variability, suggesting right atrial pressure of 3 mmHg.   FINDINGS   Left Ventricle: Left ventricular ejection fraction, by estimation, is 55  to 60%. The left ventricle has normal function. The left ventricle has no  regional wall motion abnormalities. The left ventricular internal cavity  size was normal in size. There is   mild concentric left ventricular hypertrophy. Left ventricular diastolic  parameters were normal.   Right Ventricle: The right ventricular size is normal. No increase in  right ventricular wall thickness. Right ventricular systolic function is  normal.   Left Atrium: Left atrial size was normal in size.   Right Atrium: Right atrial size was normal in size.   Pericardium: There is no evidence of pericardial effusion.   Mitral Valve: The mitral valve is  normal in structure. No evidence of  mitral valve regurgitation. No evidence of mitral valve stenosis.   Tricuspid Valve: The tricuspid valve is normal in structure. Tricuspid  valve regurgitation is not demonstrated. No  evidence of tricuspid  stenosis.   Aortic Valve: The aortic valve has been repaired/replaced. Aortic valve  regurgitation is mild to moderate. Aortic regurgitation PHT measures 598  msec. No aortic stenosis is present. Aortic valve mean gradient measures  15.0 mmHg. Aortic valve peak  gradient measures 27.2 mmHg. There is a valve present in the aortic  position.   Pulmonic Valve: The pulmonic valve was normal in structure. Pulmonic valve  regurgitation is mild. No evidence of pulmonic stenosis.   Aorta: The aortic root is normal in size and structure.   Venous: The inferior vena cava is normal in size with greater than 50%  respiratory variability, suggesting right atrial pressure of 3 mmHg.   IAS/Shunts: No atrial level shunt detected by color flow Doppler.     Risk Assessment/Calculations:              Physical Exam:   VS:  BP 124/84   Pulse 78   Ht 5\' 11"  (1.803 m)   Wt 200 lb 12.8 oz (91.1 kg)   SpO2 98%   BMI 28.01 kg/m    Wt Readings from Last 3 Encounters:  01/10/23 200 lb 12.8 oz (91.1 kg)  08/03/22 200 lb (90.7 kg)  05/31/22 204 lb (92.5 kg)     Physical Exam Vitals reviewed.  Constitutional:      Appearance: Normal appearance.  HENT:     Head: Normocephalic.  Eyes:     Pupils: Pupils are equal, round, and reactive to light.  Cardiovascular:     Rate and Rhythm: Normal rate and regular rhythm.     Pulses: Normal pulses.     Heart sounds: Murmur (faint, systolic at RUSB) heard.  Pulmonary:     Effort: Pulmonary effort is normal.     Breath sounds: Normal breath sounds.  Abdominal:     General: Abdomen is flat.     Palpations: Abdomen is soft.  Musculoskeletal:     Right lower leg: No edema.     Left lower leg: No edema.  Skin:    General: Skin is warm and dry.     Capillary Refill: Capillary refill takes less than 2 seconds.  Neurological:     General: No focal deficit present.     Mental Status: He is alert and oriented to person, place, and  time.  Psychiatric:        Mood and Affect: Mood normal.        Behavior: Behavior normal.        Thought Content: Thought content normal.        Judgment: Judgment normal.           ASSESSMENT AND PLAN:     Assessment and Plan    Hypertension Recent episode of elevated blood pressure with associated chest tightness and jitteriness. No recurrence of symptoms since the episode. Blood pressure controlled at the time of visit. -Continue current antihypertensive regimen. -Monitor blood pressure regularly, particularly in the afternoon when symptoms previously occurred.  Coronary Artery Disease Status post STEMI in 2016 with PCI to proximal LAD. Stable since last visit. No recurrent chest pain or change in exertional tolerance. -Continue current regimen including Metoprolol and Atorvastatin. -Consider stress test if patient develops recurrent chest tightness or shortness of breath.  Hyperlipidemia LDL  62 as of 05/31/22. Ideally LDL <55. -Patient pending physical with PCP. Can have lipid panel checked then. -Continue Atorvastatin 80mg  and consider adding Zetia if LDL remains above goal.  Aortic Insufficiency Status post aortic valve repair in 2020. Stable on recent echocardiogram with mild to moderate AI. -Continue annual monitoring with echocardiogram.   Dilated aortic root 41 mm on TTE in 08/2021 and 44 mm on CT in 09/2021. Reported "normal in size and structure" on June 2024 study. -BP well controlled -Continue annual monitoring                Signed, Perlie Gold, PA-C

## 2023-01-10 NOTE — Patient Instructions (Signed)
Medication Instructions:   Your physician recommends that you continue on your current medications as directed. Please refer to the Current Medication list given to you today.   *If you need a refill on your cardiac medications before your next appointment, please call your pharmacy*   Lab Work:  None ordered.  If you have labs (blood work) drawn today and your tests are completely normal, you will receive your results only by: MyChart Message (if you have MyChart) OR A paper copy in the mail If you have any lab test that is abnormal or we need to change your treatment, we will call you to review the results.   Testing/Procedures:  None ordered.   Follow-Up: At [facility], you and your health needs are our priority.  As part of our continuing mission to provide you with exceptional heart care, we have created designated Provider Care Teams.  These Care Teams include your primary Cardiologist (physician) and Advanced Practice Providers (APPs -  Physician Assistants and Nurse Practitioners) who all work together to provide you with the care you need, when you need it.  We recommend signing up for the patient portal called "MyChart".  Sign up information is provided on this After Visit Summary.  MyChart is used to connect with patients for Virtual Visits (Telemedicine).  Patients are able to view lab/test results, encounter notes, upcoming appointments, etc.  Non-urgent messages can be sent to your provider as well.   To learn more about what you can do with MyChart, go to [email or web address].    Your next appointment:   6 month(s)  Provider:   Armanda Magic, MD     Other Instructions   Your physician wants you to follow-up in: 6 months.  You will receive a reminder letter in the mail two months in advance. If you don't receive a letter, please call our office to schedule the follow-up appointment.

## 2023-01-17 ENCOUNTER — Ambulatory Visit (INDEPENDENT_AMBULATORY_CARE_PROVIDER_SITE_OTHER): Payer: Commercial Managed Care - PPO

## 2023-05-23 ENCOUNTER — Encounter: Payer: Self-pay | Admitting: Internal Medicine

## 2023-05-23 ENCOUNTER — Ambulatory Visit: Payer: Commercial Managed Care - PPO | Admitting: Internal Medicine

## 2023-05-23 ENCOUNTER — Other Ambulatory Visit (HOSPITAL_BASED_OUTPATIENT_CLINIC_OR_DEPARTMENT_OTHER): Payer: Self-pay

## 2023-05-23 VITALS — BP 120/82 | HR 87 | Temp 98.1°F | Ht 71.0 in | Wt 192.0 lb

## 2023-05-23 DIAGNOSIS — Z125 Encounter for screening for malignant neoplasm of prostate: Secondary | ICD-10-CM

## 2023-05-23 DIAGNOSIS — Z0001 Encounter for general adult medical examination with abnormal findings: Secondary | ICD-10-CM | POA: Diagnosis not present

## 2023-05-23 DIAGNOSIS — E538 Deficiency of other specified B group vitamins: Secondary | ICD-10-CM | POA: Diagnosis not present

## 2023-05-23 DIAGNOSIS — R1013 Epigastric pain: Secondary | ICD-10-CM | POA: Diagnosis not present

## 2023-05-23 DIAGNOSIS — E559 Vitamin D deficiency, unspecified: Secondary | ICD-10-CM | POA: Diagnosis not present

## 2023-05-23 DIAGNOSIS — I10 Essential (primary) hypertension: Secondary | ICD-10-CM

## 2023-05-23 DIAGNOSIS — R7303 Prediabetes: Secondary | ICD-10-CM | POA: Diagnosis not present

## 2023-05-23 DIAGNOSIS — E611 Iron deficiency: Secondary | ICD-10-CM

## 2023-05-23 DIAGNOSIS — Z1211 Encounter for screening for malignant neoplasm of colon: Secondary | ICD-10-CM

## 2023-05-23 DIAGNOSIS — E7800 Pure hypercholesterolemia, unspecified: Secondary | ICD-10-CM

## 2023-05-23 LAB — VITAMIN B12: Vitamin B-12: 315 pg/mL (ref 211–911)

## 2023-05-23 LAB — URINALYSIS, ROUTINE W REFLEX MICROSCOPIC
Bilirubin Urine: NEGATIVE
Ketones, ur: 15 — AB
Leukocytes,Ua: NEGATIVE
Nitrite: NEGATIVE
Specific Gravity, Urine: <=1.005 — AB (ref 1.000–1.030)
Total Protein, Urine: NEGATIVE
Urine Glucose: NEGATIVE
Urobilinogen, UA: 0.2 (ref 0.0–1.0)
pH: 6 (ref 5.0–8.0)

## 2023-05-23 LAB — FERRITIN: Ferritin: 88 ng/mL (ref 22.0–322.0)

## 2023-05-23 LAB — TSH: TSH: 1.66 u[IU]/mL (ref 0.35–5.50)

## 2023-05-23 LAB — VITAMIN D 25 HYDROXY (VIT D DEFICIENCY, FRACTURES): VITD: 38.08 ng/mL (ref 30.00–100.00)

## 2023-05-23 LAB — PSA: PSA: 0.97 ng/mL (ref 0.10–4.00)

## 2023-05-23 LAB — HEMOGLOBIN A1C: Hgb A1c MFr Bld: 6.1 % (ref 4.6–6.5)

## 2023-05-23 MED ORDER — SUCRALFATE 1 G PO TABS
1.0000 g | ORAL_TABLET | Freq: Four times a day (QID) | ORAL | 0 refills | Status: AC
  Filled 2023-05-23: qty 120, 30d supply, fill #0

## 2023-05-23 NOTE — Assessment & Plan Note (Signed)
 Lab Results  Component Value Date   HGBA1C 6.1 05/31/2022   Stable, pt to continue current medical treatment  - diet , wt control

## 2023-05-23 NOTE — Assessment & Plan Note (Signed)
 Age and sex appropriate education and counseling updated with regular exercise and diet Referrals for preventative services - for colonoscopy Immunizations addressed - declines covid booster and prevnar 20 Smoking counseling  - pt counsled to quit, pt not ready Evidence for depression or other mood disorder - chronic anxiety stable Most recent labs reviewed. I have personally reviewed and have noted: 1) the patient's medical and social history 2) The patient's current medications and supplements 3) The patient's height, weight, and BMI have been recorded in the chart

## 2023-05-23 NOTE — Assessment & Plan Note (Signed)
 Lab Results  Component Value Date   LDLCALC 62 05/31/2022   Stable, pt to continue current statin lipitor 80 qd

## 2023-05-23 NOTE — Assessment & Plan Note (Signed)
 BP Readings from Last 3 Encounters:  05/23/23 120/82  01/10/23 124/84  12/27/22 (!) 141/89   Stable, pt to continue medical treatment lopressor 25 bid

## 2023-05-23 NOTE — Assessment & Plan Note (Signed)
 Etiology unclear, mild to mod, non toxic, ? Gastritis vs IBS like  - for carafate 1 gm qid, labs as ordered including lipase, for colonoscopy as above

## 2023-05-23 NOTE — Patient Instructions (Addendum)
 We will need to do the script and medical documentation for the replacement prosthesis  Please take all new medication as prescribed - the carafate for 1 month  Please continue all other medications as before, and refills have been done if requested.  Please have the pharmacy call with any other refills you may need.  Please continue your efforts at being more active, low cholesterol diet, and weight control.  You are otherwise up to date with prevention measures today.  Please keep your appointments with your specialists as you may have planned  You will be contacted regarding the referral for: colonoscopy  Please go to the LAB at the blood drawing area for the tests to be done  You will be contacted by phone if any changes need to be made immediately.  Otherwise, you will receive a letter about your results with an explanation, but please check with MyChart first.  Please make an Appointment to return for your 1 year visit, or sooner if needed, with Lab testing by Appointment as well, to be done about 3-5 days before at the FIRST FLOOR Lab (so this is for TWO appointments - please see the scheduling desk as you leave)

## 2023-05-23 NOTE — Assessment & Plan Note (Signed)
 Last vitamin D Lab Results  Component Value Date   VD25OH 28.09 (L) 05/31/2022   Low, to start oral replacement

## 2023-05-23 NOTE — Progress Notes (Signed)
 [name], male   DOB: 07-18-1965, 58 y.o.   MRN: [id number]         Chief Complaint:: wellness exam and epigastric pain       HPI:  Isaac Patel is a 58 y.o. male here for wellness exam; decliens covid booster and prevnar 20, for colonoscopy o/w up to date  Stll smoking, not ready to quit.  Also needs rx for new prosthetic as current both arms are more than 59 yo, the greater hand does not work at all, and prosthetic is needed imperative to perform work duties.                          Also Pt denies chest pain, increased sob or doe, wheezing, orthopnea, PND, increased LE swelling, palpitations, dizziness or syncope.   Pt denies polydipsia, polyuria, or new focal neuro s/s.    Pt denies fever, wt loss, night sweats, loss of appetite, or other constitutional symptoms  Denies worsening reflux, dysphagia, n/v, or blood but has mostly epigastric pain and intermittent loose stools for several days, became nervous and he and wife encouraged him to present today.  Did also have recent flu like illness now resolved.     Wt Readings from Last 3 Encounters:  05/23/23 192 lb (87.1 kg)  01/10/23 200 lb 12.8 oz (91.1 kg)  08/03/22 200 lb (90.7 kg)   BP Readings from Last 3 Encounters:  05/23/23 120/82  01/10/23 124/84  12/27/22 (!) 141/89   Immunization History  Administered Date(s) Administered   Influenza,inj,Quad PF,6+ Mos 12/27/2017, 01/16/2023   Influenza-Unspecified 01/06/2015, 12/19/2020, 01/20/2022   PFIZER(Purple Top)SARS-COV-2 Vaccination 05/13/2019, 05/30/2019   Pfizer(Comirnaty)Fall Seasonal Vaccine 12 years and older 12/21/2021   Tdap 09/21/2017   Zoster Recombinant(Shingrix) 12/06/2019, 04/30/2020   Health Maintenance Due  Topic Date Due   Pneumococcal Vaccine 73-56 Years old (1 of 2 - PCV) Never done   COVID-19 Vaccine (4 - 2024-25 season) 11/28/2022   Colonoscopy  02/27/2023      Past Medical History:  Diagnosis Date   Aortic insufficiency    Status  post aortic valve repair // Echo 8/20: EF 50-55, moderate LVH, grade 1 diastolic dysfunction, normal RV SF, status post AV repair, mild AI, mild dilation of aortic root (38 mm)   CAD (coronary artery disease)    a.  Non-STEMI 8/16: LHC-proximal LAD 80% treated with a resolute DES, EF 50-55%;  b.  Echo 8/16:  Moderate LVH, EF 55-60%, normal wall motion, normal diastolic function, normal RV function   CHF (congestive heart failure) (HCC)    Dilated aortic root (HCC)    38mm by echo 08/2020 and 41 mm by echo 08/2021 and 44 mm by chest CTA 09-2021   Dyspnea    mild   GERD (gastroesophageal reflux disease)    Headache    "maybe monthly" (10/28/2014)   HLD (hyperlipidemia)    Hypertension    NSTEMI (non-ST elevated myocardial infarction) (HCC) 10/28/2014   OSA (obstructive sleep apnea)    "tried mask; wear it off and on" (10/28/2014)   S/P aortic valve repair 09/15/2018   Complex valvuloplasty including plication of prolapsing right coronary cusp with Biostable HAART 300 aortic annuloplasty, size 21 mm   Past Surgical History:  Procedure Laterality Date   AORTIC VALVE REPAIR N/A 09/15/2018   Procedure: AORTIC VALVE REPAIR USING HAART 300 AORTIC ANNULOPLASTY DEVICE SIZE ;  Surgeon: Purcell Nails, MD;  Location: [facility] OR;  Service: Open Heart Surgery;  Laterality: N/A;   ARM AMPUTATION THROUGH FOREARM Left 2009   traumatic injury   CARDIAC CATHETERIZATION N/A 10/29/2014   Procedure: Left Heart Cath and Coronary Angiography;  Surgeon: Lennette Bihari, MD;  Location: [facility] INVASIVE CV LAB;  Service: Cardiovascular;  Laterality: N/A;   CHOLESTEATOMA EXCISION Right 1990's   LEFT HEART CATH AND CORONARY ANGIOGRAPHY N/A 05/25/2016   Procedure: Left Heart Cath and Coronary Angiography;  Surgeon: Tonny Bollman, MD;  Location: [facility] INVASIVE CV LAB;  Service: Cardiovascular;  Laterality: N/A;   MULTIPLE TOOTH EXTRACTIONS  08/29/2018   NASAL SEPTOPLASTY W/ TURBINOPLASTY Bilateral 11/30/2017   Procedure: NASAL  SEPTOPLASTY WITH TURBINATE REDUCTION;  Surgeon: Osborn Coho, MD;  Location: [facility] OR;  Service: ENT;  Laterality: Bilateral;   RIGHT/LEFT HEART CATH AND CORONARY ANGIOGRAPHY N/A 08/11/2018   Procedure: RIGHT/LEFT HEART CATH AND CORONARY ANGIOGRAPHY;  Surgeon: Kathleene Hazel, MD;  Location: [facility] INVASIVE CV LAB;  Service: Cardiovascular;  Laterality: N/A;   TEE WITHOUT CARDIOVERSION N/A 06/08/2018   Procedure: TRANSESOPHAGEAL ECHOCARDIOGRAM (TEE);  Surgeon: Jodelle Red, MD;  Location: [facility] ENDOSCOPY;  Service: Cardiovascular;  Laterality: N/A;   TEE WITHOUT CARDIOVERSION N/A 09/15/2018   Procedure: TRANSESOPHAGEAL ECHOCARDIOGRAM (TEE);  Surgeon: Purcell Nails, MD;  Location: [facility] OR;  Service: Open Heart Surgery;  Laterality: N/A;   TYMPANOSTOMY TUBE PLACEMENT Bilateral "as a kid"   "had one done as an adult too"   WISDOM TOOTH EXTRACTION      reports that he has been smoking cigarettes. He started smoking about 36 years ago. He has a 16 pack-year smoking history. He has never used smokeless tobacco. He reports current alcohol use of about 2.0 - 3.0 standard drinks of alcohol per week. He reports that he does not currently use drugs after having used the following drugs: Marijuana. family history includes Bradycardia in his father; Diverticulitis in his mother; Healthy in his father; Multiple sclerosis in his maternal grandfather; Skin cancer in his mother. Allergies  Allergen Reactions   Morphine And Codeine Itching   Current Outpatient Medications on File Prior to Visit  Medication Sig Dispense Refill   aspirin EC 81 MG tablet Take 81 mg by mouth daily.     atorvastatin (LIPITOR) 80 MG tablet Take 1 tablet (80 mg total) by mouth daily. 90 tablet 3   calcium carbonate (TUMS - DOSED IN MG ELEMENTAL CALCIUM) 500 MG chewable tablet Chew 2 tablets by mouth daily as needed for indigestion or heartburn.     fluticasone (FLONASE) 50 MCG/ACT nasal spray Place 1 spray into both nostrils  daily as needed for allergies or rhinitis.     levocetirizine (XYZAL) 5 MG tablet Take 1 tablet (5 mg total) by mouth every evening. 90 tablet 2   metoprolol tartrate (LOPRESSOR) 25 MG tablet Take 1 tablet (25 mg total) by mouth 2 (two) times daily. 180 tablet 3   Multiple Vitamin (MULTI-VITAMIN PO) Take 1 tablet by mouth daily.     nitroGLYCERIN (NITROSTAT) 0.4 MG SL tablet Place 1 tablet (0.4 mg total) under the tongue every 5 (five) minutes x 3 doses as needed for chest pain. 25 tablet 3   pantoprazole (PROTONIX) 40 MG tablet Take 1 tablet (40 mg total) by mouth daily. 90 tablet 3   No current facility-administered medications on file prior to visit.        ROS:  All others reviewed and negative.  Objective        PE:  BP 120/82 (  BP Location: Right Arm, Patient Position: Sitting, Cuff Size: Normal)   Pulse 87   Temp 98.1 F (36.7 C) (Oral)   Ht 5\' 11"  (1.803 m)   Wt 192 lb (87.1 kg)   SpO2 96%   BMI 26.78 kg/m                 Constitutional: Pt appears in NAD               HENT: Head: NCAT.                Right Ear: External ear normal.                 Left Ear: External ear normal.                Eyes: . Pupils are equal, round, and reactive to light. Conjunctivae and EOM are normal               Nose: without d/c or deformity               Neck: Neck supple. Gross normal ROM               Cardiovascular: Normal rate and regular rhythm.                 Pulmonary/Chest: Effort normal and breath sounds without rales or wheezing.                Abd:  Soft, mild epigastric tender, ND, + BS, no organomegaly               Neurological: Pt is alert. At baseline orientation, motor grossly intact               Skin: Skin is warm. No rashes, no other new lesions, LE edema - none               Psychiatric: Pt behavior is normal without agitation, nervous   Micro: none  Cardiac tracings I have personally interpreted today:  none  Pertinent Radiological findings (summarize): none    Lab Results  Component Value Date   WBC 6.8 12/26/2022   HGB 16.0 12/26/2022   HCT 46.7 12/26/2022   PLT 212 12/26/2022   GLUCOSE 103 (H) 12/26/2022   CHOL 132 05/31/2022   TRIG 101.0 05/31/2022   HDL 49.80 05/31/2022   LDLCALC 62 05/31/2022   ALT 23 05/31/2022   AST 19 05/31/2022   NA 140 12/26/2022   K 4.2 12/26/2022   CL 104 12/26/2022   CREATININE 0.99 12/26/2022   BUN 19 12/26/2022   CO2 28 12/26/2022   TSH 1.56 05/31/2022   PSA 1.02 05/31/2022   INR 1.4 (H) 09/15/2018   HGBA1C 6.1 05/31/2022   Assessment/Plan:  Shedric Fredericks is a 58 y.o. White or Caucasian [1] male with  has a past medical history of Aortic insufficiency, CAD (coronary artery disease), CHF (congestive heart failure) (HCC), Dilated aortic root (HCC), Dyspnea, GERD (gastroesophageal reflux disease), Headache, HLD (hyperlipidemia), Hypertension, NSTEMI (non-ST elevated myocardial infarction) (HCC) (10/28/2014), OSA (obstructive sleep apnea), and S/P aortic valve repair (09/15/2018).  Encounter for well adult exam with abnormal findings Age and sex appropriate education and counseling updated with regular exercise and diet Referrals for preventative services - for colonoscopy Immunizations addressed - declines covid booster and prevnar 20 Smoking counseling  - pt counsled to quit, pt not ready Evidence for depression or other mood disorder - chronic anxiety stable Most  recent labs reviewed. I have personally reviewed and have noted: 1) the patient's medical and social history 2) The patient's current medications and supplements 3) The patient's height, weight, and BMI have been recorded in the chart   Vitamin D deficiency Last vitamin D Lab Results  Component Value Date   VD25OH 28.09 (L) 05/31/2022   Low, to start oral replacement   Pre-diabetes Lab Results  Component Value Date   HGBA1C 6.1 05/31/2022   Stable, pt to continue current medical treatment  - diet, wt control   HLD  (hyperlipidemia) Lab Results  Component Value Date   LDLCALC 62 05/31/2022   Stable, pt to continue current statin lipitor 80 qd   Essential hypertension BP Readings from Last 3 Encounters:  05/23/23 120/82  01/10/23 124/84  12/27/22 (!) 141/89   Stable, pt to continue medical treatment lopressor 25 bid   Epigastric pain Etiology unclear, mild to mod, non toxic, ? Gastritis vs IBS like  - for carafate 1 gm qid, labs as ordered including lipase, for colonoscopy as above  Followup: Return in about 1 year (around 05/22/2024).  Oliver Barre, MD 05/23/2023 8:41 PM [facility]

## 2023-05-24 ENCOUNTER — Encounter: Payer: Self-pay | Admitting: Internal Medicine

## 2023-05-24 LAB — IBC PANEL
Iron: 229 ug/dL — ABNORMAL HIGH (ref 42–165)
Saturation Ratios: 64.4 % — ABNORMAL HIGH (ref 20.0–50.0)
TIBC: 355.6 ug/dL (ref 250.0–450.0)
Transferrin: 254 mg/dL (ref 212.0–360.0)

## 2023-05-24 LAB — CBC WITH DIFFERENTIAL/PLATELET
Basophils Absolute: 0 10*3/uL (ref 0.0–0.1)
Basophils Relative: 0.3 % (ref 0.0–3.0)
Eosinophils Absolute: 0 10*3/uL (ref 0.0–0.7)
Eosinophils Relative: 0.2 % (ref 0.0–5.0)
HCT: 48.8 % (ref 39.0–52.0)
Hemoglobin: 16.5 g/dL (ref 13.0–17.0)
Lymphocytes Relative: 12.9 % (ref 12.0–46.0)
Lymphs Abs: 1 10*3/uL (ref 0.7–4.0)
MCHC: 33.7 g/dL (ref 30.0–36.0)
MCV: 98.7 fl (ref 78.0–100.0)
Monocytes Absolute: 0.6 10*3/uL (ref 0.1–1.0)
Monocytes Relative: 7.2 % (ref 3.0–12.0)
Neutro Abs: 6.2 10*3/uL (ref 1.4–7.7)
Neutrophils Relative %: 79.4 % — ABNORMAL HIGH (ref 43.0–77.0)
Platelets: 285 10*3/uL (ref 150.0–400.0)
RBC: 4.95 Mil/uL (ref 4.22–5.81)
RDW: 13.6 % (ref 11.5–15.5)
WBC: 7.8 10*3/uL (ref 4.0–10.5)

## 2023-05-24 LAB — MICROALBUMIN / CREATININE URINE RATIO
Creatinine,U: 41.2 mg/dL
Microalb Creat Ratio: 36.1 mg/g — ABNORMAL HIGH (ref 0.0–30.0)
Microalb, Ur: 1.5 mg/dL (ref 0.0–1.9)

## 2023-05-24 LAB — BASIC METABOLIC PANEL
BUN: 12 mg/dL (ref 6–23)
CO2: 24 meq/L (ref 19–32)
Calcium: 9.2 mg/dL (ref 8.4–10.5)
Chloride: 101 meq/L (ref 96–112)
Creatinine, Ser: 1.05 mg/dL (ref 0.40–1.50)
GFR: 78.27 mL/min (ref >60.00)
Glucose, Bld: 106 mg/dL — ABNORMAL HIGH (ref 70–99)
Potassium: 3.9 meq/L (ref 3.5–5.1)
Sodium: 140 meq/L (ref 135–145)

## 2023-05-24 LAB — LIPASE: Lipase: 10 U/L — ABNORMAL LOW (ref 11.0–59.0)

## 2023-05-24 LAB — LIPID PANEL
Cholesterol: 146 mg/dL (ref 0–200)
HDL: 53.8 mg/dL (ref >39.00)
LDL Cholesterol: 71 mg/dL (ref 0–99)
NonHDL: 92.03
Total CHOL/HDL Ratio: 3
Triglycerides: 103 mg/dL (ref 0.0–149.0)
VLDL: 20.6 mg/dL (ref 0.0–40.0)

## 2023-05-24 LAB — HEPATIC FUNCTION PANEL
ALT: 24 U/L (ref 0–53)
AST: 22 U/L (ref 0–37)
Albumin: 4.4 g/dL (ref 3.5–5.2)
Alkaline Phosphatase: 78 U/L (ref 39–117)
Bilirubin, Direct: 0.3 mg/dL (ref 0.0–0.3)
Total Bilirubin: 1.3 mg/dL — ABNORMAL HIGH (ref 0.2–1.2)
Total Protein: 7 g/dL (ref 6.0–8.3)

## 2023-05-24 LAB — AMYLASE: Amylase: 40 U/L (ref 27–131)

## 2023-05-24 NOTE — Progress Notes (Signed)
 The test results show that your current treatment is OK, as the tests are stable.  Please continue the same plan.  There is no other need for change of treatment or further evaluation based on these results, at this time.  thanks

## 2023-05-25 ENCOUNTER — Encounter: Payer: Self-pay | Admitting: Gastroenterology

## 2023-07-25 ENCOUNTER — Ambulatory Visit: Payer: Commercial Managed Care - PPO | Admitting: Gastroenterology

## 2023-07-25 ENCOUNTER — Other Ambulatory Visit (HOSPITAL_BASED_OUTPATIENT_CLINIC_OR_DEPARTMENT_OTHER): Payer: Self-pay

## 2023-07-25 ENCOUNTER — Encounter: Payer: Self-pay | Admitting: Gastroenterology

## 2023-07-25 ENCOUNTER — Other Ambulatory Visit

## 2023-07-25 VITALS — BP 132/76 | HR 61 | Ht 71.0 in | Wt 188.5 lb

## 2023-07-25 DIAGNOSIS — K2100 Gastro-esophageal reflux disease with esophagitis, without bleeding: Secondary | ICD-10-CM | POA: Diagnosis not present

## 2023-07-25 DIAGNOSIS — R1084 Generalized abdominal pain: Secondary | ICD-10-CM | POA: Diagnosis not present

## 2023-07-25 DIAGNOSIS — F1010 Alcohol abuse, uncomplicated: Secondary | ICD-10-CM

## 2023-07-25 DIAGNOSIS — R634 Abnormal weight loss: Secondary | ICD-10-CM | POA: Diagnosis not present

## 2023-07-25 DIAGNOSIS — R194 Change in bowel habit: Secondary | ICD-10-CM

## 2023-07-25 MED ORDER — NA SULFATE-K SULFATE-MG SULF 17.5-3.13-1.6 GM/177ML PO SOLN
1.0000 | ORAL | 0 refills | Status: DC
  Filled 2023-07-25: qty 354, 2d supply, fill #0

## 2023-07-25 NOTE — Progress Notes (Signed)
 [facility] Consult Note:  History: Isaac Patel 07/25/2023  Referring provider: Roslyn Coombe, MD  Reason for consult/chief complaint: Abdominal Pain (LLQ pain.  He describes it as a pulsing, dull pain.  It comes and goes.), Constipation (He said he has constipation and when he goes they can be loose or oily.  He thinks it is related to his alcohol consumption.), and Gastroesophageal Reflux (Takes protonix  regularly.  He also takes carafate .)   Subjective  Prior history:  From Dr. Emaline Handsome last office note September 2019: "History of Present Illness: This is a 58 year old male GERD, bloating.  He relates of fullness or drainage in his throat and a sensation of difficulty swallowing all types of foods and saliva.  He localizes the symptoms to his neck.  His heartburn is under good control.  He has occasional upper abdominal discomfort and bloating that is relieved with dicyclomine .  He wanted to review the results and dates of his prior EGD and colonoscopy. EGD and colonoscopy were performed in 11/2011 at [facility] in [place]  [place].  We reviewed the findings in detail.  " Barium esophagram was ordered to rule out esophageal stricture, but Dr. Emaline Handsome impression was that the symptoms were allergic in nature, and noted that patient had recently had a surgery for deviated septum.   Discussed the use of AI scribe software for clinical note transcription with the patient, who gave verbal consent to proceed.  History of Present Illness Isaac Raia "Alvie Jolly" is a 58 year old male who presents with gastrointestinal symptoms and weight loss.  He has a history of alcohol use, consuming vodka typically three days a week, primarily over the weekend. He acknowledges being an alcoholic and has attended AA meetings in the past, which he stopped during the COVID-19 pandemic when meetings went online. He believes his alcohol consumption contributes to his gastrointestinal  issues and appetite suppression. When he abstains from drinking, his appetite returns.  He experiences gastrointestinal symptoms including upset stomach, discomfort, bloating, and a heavy feeling primarily in the left lower abdomen, which he associates with the colon's turn. The pain can radiate to the right side and sometimes to the back, described as 'dancing' and sometimes associated with reflux pain in the breastplate. He has irregular bowel habits with variations in stool color from brown to burnt orange, and occasionally green during stomach upsets. Loose bowel movements are a primary concern, with occasional constipation following medication use for diarrhea. Stools are sometimes oily, leaving a greasy residue on toilet paper. No bright red blood in stools, and they are not pale or black and tarry.  He has experienced weight loss since January, initially due to the flu and loss of appetite, dropping from over 200 pounds to about 188 pounds. His weight has been fluctuating around 190 pounds. He attributes some of the weight loss to appetite suppression from alcohol consumption.  He is currently on Protonix  for heartburn and has been prescribed Carafate  for stomach upset, which he takes infrequently due to its cumbersome nature. He reports no swallowing problems but finds the Carafate  tablets large and difficult to swallow.  He expresses anxiety about medical procedures, particularly endoscopic ones, due to past experiences of waking up during procedures. He mentions a history of a transesophageal echo and a colonoscopy over a decade ago, both of which he found distressing.     ROS:  Review of Systems  Constitutional:  Negative for appetite change and unexpected weight change.  HENT:  Negative for  mouth sores and voice change.   Eyes:  Negative for pain and redness.  Respiratory:  Negative for cough and shortness of breath.   Cardiovascular:  Negative for chest pain and palpitations.   Genitourinary:  Negative for dysuria and hematuria.  Musculoskeletal:  Positive for back pain. Negative for arthralgias and myalgias.  Skin:  Negative for pallor and rash.  Allergic/Immunologic: Positive for environmental allergies.  Neurological:  Negative for weakness and headaches.  Hematological:  Negative for adenopathy.     Past Medical History: Past Medical History:  Diagnosis Date   Aortic insufficiency    Status post aortic valve repair // Echo 8/20: EF 50-55, moderate LVH, grade 1 diastolic dysfunction, normal RV SF, status post AV repair, mild AI, mild dilation of aortic root (38 mm)   CAD (coronary artery disease)    a.  Non-STEMI 8/16: LHC-proximal LAD 80% treated with a resolute DES, EF 50-55%;  b.  Echo 8/16:  Moderate LVH, EF 55-60%, normal wall motion, normal diastolic function, normal RV function   CHF (congestive heart failure) (HCC)    Dilated aortic root (HCC)    38mm by echo 08/2020 and 41 mm by echo 08/2021 and 44 mm by chest CTA 09-2021   Dyspnea    mild   GERD (gastroesophageal reflux disease)    Headache    "maybe monthly" (10/28/2014)   HLD (hyperlipidemia)    Hypertension    NSTEMI (non-ST elevated myocardial infarction) (HCC) 10/28/2014   OSA (obstructive sleep apnea)    "tried mask; wear it off and on" (10/28/2014)   S/P aortic valve repair 09/15/2018   Complex valvuloplasty including plication of prolapsing right coronary cusp with Biostable HAART 300 aortic annuloplasty, size 21 mm   Cardiology office note 01/10/23 reviewed.  Past Surgical History: Past Surgical History:  Procedure Laterality Date   AORTIC VALVE REPAIR N/A 09/15/2018   Procedure: AORTIC VALVE REPAIR USING HAART 300 AORTIC ANNULOPLASTY DEVICE SIZE ;  Surgeon: Gardenia Jump, MD;  Location: [facility];  Service: Open Heart Surgery;  Laterality: N/A;   ARM AMPUTATION THROUGH FOREARM Left 2009   traumatic injury   CARDIAC CATHETERIZATION N/A 10/29/2014   Procedure: Left Heart Cath and  Coronary Angiography;  Surgeon: Millicent Ally, MD;  Location: [facility] INVASIVE CV LAB;  Service: Cardiovascular;  Laterality: N/A;   CHOLESTEATOMA EXCISION Right 1990's   LEFT HEART CATH AND CORONARY ANGIOGRAPHY N/A 05/25/2016   Procedure: Left Heart Cath and Coronary Angiography;  Surgeon: Arnoldo Lapping, MD;  Location: [facility] INVASIVE CV LAB;  Service: Cardiovascular;  Laterality: N/A;   MULTIPLE TOOTH EXTRACTIONS  08/29/2018   NASAL SEPTOPLASTY W/ TURBINOPLASTY Bilateral 11/30/2017   Procedure: NASAL SEPTOPLASTY WITH TURBINATE REDUCTION;  Surgeon: Ammon Bales, MD;  Location: [facility] OR;  Service: ENT;  Laterality: Bilateral;   RIGHT/LEFT HEART CATH AND CORONARY ANGIOGRAPHY N/A 08/11/2018   Procedure: RIGHT/LEFT HEART CATH AND CORONARY ANGIOGRAPHY;  Surgeon: Odie Benne, MD;  Location: [facility] INVASIVE CV LAB;  Service: Cardiovascular;  Laterality: N/A;   TEE WITHOUT CARDIOVERSION N/A 06/08/2018   Procedure: TRANSESOPHAGEAL ECHOCARDIOGRAM (TEE);  Surgeon: Sheryle Donning, MD;  Location: [facility] ENDOSCOPY;  Service: Cardiovascular;  Laterality: N/A;   TEE WITHOUT CARDIOVERSION N/A 09/15/2018   Procedure: TRANSESOPHAGEAL ECHOCARDIOGRAM (TEE);  Surgeon: Gardenia Jump, MD;  Location: [facility] OR;  Service: Open Heart Surgery;  Laterality: N/A;   TYMPANOSTOMY TUBE PLACEMENT Bilateral "as a kid"   "had one done as an adult too"   WISDOM TOOTH EXTRACTION  Family History: Family History  Problem Relation Age of Onset   Diverticulitis Mother    Skin cancer Mother    Cataracts Mother    Healthy Father    Bradycardia Father    Prostate cancer Father    Multiple sclerosis Maternal Grandfather    Colon cancer Neg Hx    Stomach cancer Neg Hx    Rectal cancer Neg Hx    Liver cancer Neg Hx    Esophageal cancer Neg Hx     Social History: Social History   Socioeconomic History   Marital status: Married    Spouse name: Not on file   Number of children: 2   Years of education: 14   Highest  education level: Associate degree: occupational, Scientist, product/process development, or vocational program  Occupational History   Occupation: CMA  Tobacco Use   Smoking status: Every Day    Current packs/day: 0.00    Average packs/day: 0.5 packs/day for 32.0 years (16.0 ttl pk-yrs)    Types: Cigarettes    Start date: 07/28/1986    Last attempt to quit: 07/28/2018    Years since quitting: 4.9   Smokeless tobacco: Never   Tobacco comments:    6 cigarettes per day 08/28/19 [facility]   Vaping Use   Vaping status: Former  Substance and Sexual Activity   Alcohol use: Yes    Alcohol/week: 5.0 standard drinks of alcohol    Types: 5 Standard drinks or equivalent per week    Comment: daily   Drug use: Not Currently    Types: Marijuana    Comment: 05/24/2016  "stopped marijuana in the early 2000's"   Sexual activity: Yes  Other Topics Concern   Not on file  Social History Narrative   Born and raised in [place], [place].  Currently resides in a house with his wife and children. 1 dog. Fun: play computer games, sports.    Denies any religious beliefs effecting health care.    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 [place], [address]   Social Drivers of Health   Financial Resource Strain: Low Risk  (05/20/2023)   Overall Financial Resource Strain (CARDIA)    Difficulty of Paying Living Expenses: Not hard at all  Food Insecurity: No Food Insecurity (05/20/2023)   Hunger Vital Sign    Worried About Running Out of Food in the Last Year: Never true    Ran Out of Food in the Last Year: Never true  Transportation Needs: No Transportation Needs (05/20/2023)   PRAPARE - Administrator, Civil Service (Medical): No    Lack of Transportation (Non-Medical): No  Physical Activity: Insufficiently Active (05/20/2023)   Exercise Vital Sign    Days of Exercise per Week: 4 days    Minutes of Exercise per Session: 30 min  Stress: No Stress Concern Present (05/20/2023)   [employer] of Occupational  Health - Occupational Stress Questionnaire    Feeling of Stress : Not at all  Social Connections: Socially Isolated (05/20/2023)   Social Connection and Isolation Panel [NHANES]    Frequency of Communication with Friends and Family: Once a week    Frequency of Social Gatherings with Friends and Family: Once a week    Attends Religious Services: Never    Database administrator or Organizations: No    Attends Engineer, structural: Not on file    Marital Status: Married    Allergies: Allergies  Allergen Reactions  Morphine  And Codeine  Itching    Outpatient Meds: Current Outpatient Medications  Medication Sig Dispense Refill   aspirin  EC 81 MG tablet Take 81 mg by mouth daily.     atorvastatin  (LIPITOR ) 80 MG tablet Take 1 tablet (80 mg total) by mouth daily. 90 tablet 3   calcium  carbonate (TUMS - DOSED IN MG ELEMENTAL CALCIUM ) 500 MG chewable tablet Chew 2 tablets by mouth daily as needed for indigestion or heartburn.     fluticasone (FLONASE) 50 MCG/ACT nasal spray Place 1 spray into both nostrils daily as needed for allergies or rhinitis.     levocetirizine (XYZAL ) 5 MG tablet Take 1 tablet (5 mg total) by mouth every evening. 90 tablet 2   metoprolol  tartrate (LOPRESSOR ) 25 MG tablet Take 1 tablet (25 mg total) by mouth 2 (two) times daily. 180 tablet 3   Multiple Vitamin (MULTI-VITAMIN PO) Take 1 tablet by mouth daily.     Na Sulfate-K Sulfate-Mg Sulfate concentrate (SUPREP BOWEL PREP KIT) 17.5-3.13-1.6 GM/177ML SOLN Take 1 kit (354 mLs total) by mouth as directed. For colonoscopy prep. 354 mL 0   nitroGLYCERIN  (NITROSTAT ) 0.4 MG SL tablet Place 1 tablet (0.4 mg total) under the tongue every 5 (five) minutes x 3 doses as needed for chest pain. 25 tablet 3   pantoprazole  (PROTONIX ) 40 MG tablet Take 1 tablet (40 mg total) by mouth daily. 90 tablet 3   sucralfate  (CARAFATE ) 1 g tablet Take 1 tablet (1 g total) by mouth 4 (four) times daily. 120 tablet 0   No current  facility-administered medications for this visit.      ___________________________________________________________________ Objective   Exam:  BP 132/76   Pulse 61   Ht 5\' 11"  (1.803 m)   Wt 188 lb 8 oz (85.5 kg)   SpO2 97%   BMI 26.29 kg/m  Wt Readings from Last 3 Encounters:  07/25/23 188 lb 8 oz (85.5 kg)  05/23/23 192 lb (87.1 kg)  01/10/23 200 lb 12.8 oz (91.1 kg)    General: Well-appearing, no dysphonia.  Gets on exam table independently Eyes: sclera anicteric, no redness ENT: oral mucosa moist without lesions, no cervical or supraclavicular lymphadenopathy CV: Regular with a soft murmur, no JVD, no peripheral edema Resp: clear to auscultation bilaterally, normal RR and effort noted GI: soft, no tenderness, with active bowel sounds. No guarding or palpable organomegaly noted. Skin; warm and dry, no rash or jaundice noted Neuro: awake, alert and oriented x 3. Normal gross motor function and fluent speech Left arm below the elbow prosthesis (which he removed for exam)  Labs:     Latest Ref Rng & Units 05/23/2023    3:50 PM 12/26/2022    6:24 PM 05/31/2022    8:52 AM  CBC  WBC 4.0 - 10.5 K/uL 7.8  6.8  8.4   Hemoglobin 13.0 - 17.0 g/dL 16.1  09.6  04.5   Hematocrit 39.0 - 52.0 % 48.8  46.7  46.8   Platelets 150.0 - 400.0 K/uL 285.0  212  221.0       Latest Ref Rng & Units 05/23/2023    3:50 PM 12/26/2022    6:24 PM 05/31/2022    8:52 AM  CMP  Glucose 70 - 99 mg/dL 409  811  85   BUN 6 - 23 mg/dL 12  19  18    Creatinine 0.40 - 1.50 mg/dL 9.14  7.82  9.56   Sodium 135 - 145 mEq/L 140  140  139  Potassium 3.5 - 5.1 mEq/L 3.9  4.2  4.0   Chloride 96 - 112 mEq/L 101  104  105   CO2 19 - 32 mEq/L 24  28  24    Calcium  8.4 - 10.5 mg/dL 9.2  9.3  9.4   Total Protein 6.0 - 8.3 g/dL 7.0   6.5   Total Bilirubin 0.2 - 1.2 mg/dL 1.3   1.1   Alkaline Phos 39 - 117 U/L 78   73   AST 0 - 37 U/L 22   19   ALT 0 - 53 U/L 24   23    Iron/TIBC/Ferritin/ %Sat    Component  Value Date/Time   IRON 229 (H) 05/23/2023 1550   TIBC 355.6 05/23/2023 1550   FERRITIN 88.0 05/23/2023 1550   IRONPCTSAT 64.4 (H) 05/23/2023 1550     Radiologic Studies:  CLINICAL DATA:  Dysphagia.  Gastroesophageal reflux.   EXAM: ESOPHOGRAM / BARIUM SWALLOW / BARIUM TABLET STUDY   TECHNIQUE: Combined double contrast and single contrast examination performed using effervescent crystals, thick barium liquid, and thin barium liquid. The patient was observed with fluoroscopy swallowing a 13 mm barium sulphate tablet.   FLUOROSCOPY TIME:  Fluoroscopy Time:  1 minutes 0 second   Radiation Exposure Index (if provided by the fluoroscopic device):   Number of Acquired Spot Images: 0   COMPARISON:  None.   FINDINGS: Esophageal mucosa and motility normal. Negative for stricture or mass.   Moderate gastroesophageal reflux with the patient prone drinking water. No significant hiatal hernia.   Barium tablet passed readily into the stomach.   IMPRESSION: Moderate gastroesophageal reflux. Negative for hiatal hernia or stricture.     Electronically Signed   By: Anastasio Balsam M.D.   On: 01/09/2018 08:54 _________________________  09/06/22 TTE   IMPRESSIONS     1. Left ventricular ejection fraction, by estimation, is 55 to 60%. The  left ventricle has normal function. The left ventricle has no regional  wall motion abnormalities. There is mild concentric left ventricular  hypertrophy. Left ventricular diastolic  parameters were normal.   2. Right ventricular systolic function is normal. The right ventricular  size is normal.   3. The mitral valve is normal in structure. No evidence of mitral valve  regurgitation. No evidence of mitral stenosis.   4. The aortic valve has been repaired/replaced (annuloplasty and RCC  repair). Aortic valve regurgitation is mild to moderate. No aortic  stenosis is present. There is a valve present in the aortic position.  Aortic  regurgitation PHT measures 598 msec.  Aortic valve mean gradient measures 15.0 mmHg.   5. The inferior vena cava is normal in size with greater than 50%  respiratory variability, suggesting right atrial pressure of 3 mmHg.     Encounter Diagnoses  Name Primary?   Altered bowel habits Yes   Weight loss    Gastroesophageal reflux disease with esophagitis without hemorrhage    Generalized abdominal pain     Assessment and Plan Assessment & Plan Gastroesophageal reflux disease Chronic GERD managed with Protonix  and most recently occasional Carafate  for breakthrough heartburn.  Discussed how the latter is typically for occasional or short-term use.  Longstanding symptoms, many years since last endoscopic evaluation, recommend screening for Barrett's.  Probably no stricture since he denies dysphagia. - Continue Protonix  for GERD management. - Use Carafate  as needed for severe heartburn. Decrease alcohol use  Abdominal pain Intermittent abdominal pain with bloating and heaviness. Differential includes gastrointestinal issues related to  alcohol use and potential exocrine pancreatic insufficiency. - Perform stool elastase test to evaluate for exocrine pancreatic insufficiency. - EGD  Altered bowel habits Loose stools with variable color, sometimes oily, associated with alcohol use. Differential includes exocrine pancreatic insufficiency.   Weight loss Weight loss since January, initially linked to flu and loss of appetite. Alcohol use may contribute to appetite suppression and weight fluctuations. Colonoscopy scheduled for this as well as the abdominal pain, last exam in 2013.   Alcohol use disorder Alcohol use disorder with current consumption of vodka, typically a pint over weekends. Alcohol use contributes to gastrointestinal symptoms and weight fluctuations. He is considering returning to AA for support. - Encourage return to AA program for support in managing alcohol use  disorder.  Secondary iron overload -patient brought up these abnormal lab results and questions the possible explanation Elevated iron levels most likely due to secondary iron overload from alcohol use in his case If patient is able to achieve alcohol abstinence for least several months, this could be rechecked to ensure improvement.  If they do not improve, then hemochromatosis testing would be warranted.    Plan:  He was agreeable to the EGD and colonoscopy after discussion of procedure and risks.  The benefits and risks of the planned procedure(s) were described in detail with the patient or (when appropriate) their health care proxy.  Risks were outlined as including, but not limited to, bleeding, infection, perforation, adverse medication reaction leading to cardiac or pulmonary decompensation, pancreatitis (if ERCP).  The limitation of incomplete mucosal visualization was also discussed.  No guarantees or warranties were given.  Fecal elastase ordered  Thank you for the courtesy of this consult.  Please call me with any questions or concerns.  Kerby Pearson III  CC: Referring provider noted above

## 2023-07-25 NOTE — Patient Instructions (Signed)
 You have been scheduled for a colonoscopy. Please follow written instructions given to you at your visit today.   If you use inhalers (even only as needed), please bring them with you on the day of your procedure.  DO NOT TAKE 7 DAYS PRIOR TO TEST- Trulicity (dulaglutide) Ozempic, Wegovy (semaglutide) Mounjaro (tirzepatide) Bydureon Bcise (exanatide extended release)  DO NOT TAKE 1 DAY PRIOR TO YOUR TEST Rybelsus (semaglutide) Adlyxin (lixisenatide) Victoza (liraglutide) Byetta (exanatide) ________________________________________________________  We have sent the following medications to your pharmacy for you to pick up at your convenience: Suprep   Your provider has requested that you go to the basement level for lab work before leaving today. Press "B" on the elevator. The lab is located at the first door on the left as you exit the elevator.

## 2023-07-27 ENCOUNTER — Other Ambulatory Visit: Payer: Self-pay

## 2023-07-27 ENCOUNTER — Other Ambulatory Visit: Payer: Self-pay | Admitting: Internal Medicine

## 2023-07-27 ENCOUNTER — Other Ambulatory Visit (HOSPITAL_BASED_OUTPATIENT_CLINIC_OR_DEPARTMENT_OTHER): Payer: Self-pay

## 2023-07-27 MED ORDER — PANTOPRAZOLE SODIUM 40 MG PO TBEC
40.0000 mg | DELAYED_RELEASE_TABLET | Freq: Every day | ORAL | 3 refills | Status: AC
  Filled 2023-07-27: qty 90, 90d supply, fill #0
  Filled 2024-03-17: qty 90, 90d supply, fill #1

## 2023-07-30 LAB — PANCREATIC ELASTASE, FECAL: Pancreatic Elastase-1, Stool: 375 ug/g (ref >200)

## 2023-08-04 ENCOUNTER — Other Ambulatory Visit: Payer: Self-pay | Admitting: *Deleted

## 2023-08-04 DIAGNOSIS — Z9889 Other specified postprocedural states: Secondary | ICD-10-CM

## 2023-08-08 ENCOUNTER — Encounter: Payer: Self-pay | Admitting: Gastroenterology

## 2023-08-26 ENCOUNTER — Encounter: Payer: Self-pay | Admitting: Gastroenterology

## 2023-09-05 ENCOUNTER — Encounter: Payer: Self-pay | Admitting: Gastroenterology

## 2023-09-05 ENCOUNTER — Ambulatory Visit: Admitting: Gastroenterology

## 2023-09-05 VITALS — BP 168/86 | HR 71 | Temp 98.3°F | Resp 16 | Ht 71.0 in | Wt 188.0 lb

## 2023-09-05 DIAGNOSIS — D124 Benign neoplasm of descending colon: Secondary | ICD-10-CM

## 2023-09-05 DIAGNOSIS — R634 Abnormal weight loss: Secondary | ICD-10-CM

## 2023-09-05 DIAGNOSIS — K2970 Gastritis, unspecified, without bleeding: Secondary | ICD-10-CM

## 2023-09-05 DIAGNOSIS — G4733 Obstructive sleep apnea (adult) (pediatric): Secondary | ICD-10-CM | POA: Diagnosis not present

## 2023-09-05 DIAGNOSIS — R194 Change in bowel habit: Secondary | ICD-10-CM | POA: Diagnosis not present

## 2023-09-05 DIAGNOSIS — K648 Other hemorrhoids: Secondary | ICD-10-CM

## 2023-09-05 DIAGNOSIS — K2100 Gastro-esophageal reflux disease with esophagitis, without bleeding: Secondary | ICD-10-CM | POA: Diagnosis not present

## 2023-09-05 DIAGNOSIS — R197 Diarrhea, unspecified: Secondary | ICD-10-CM

## 2023-09-05 DIAGNOSIS — K319 Disease of stomach and duodenum, unspecified: Secondary | ICD-10-CM | POA: Diagnosis not present

## 2023-09-05 DIAGNOSIS — K529 Noninfective gastroenteritis and colitis, unspecified: Secondary | ICD-10-CM | POA: Diagnosis not present

## 2023-09-05 DIAGNOSIS — K5730 Diverticulosis of large intestine without perforation or abscess without bleeding: Secondary | ICD-10-CM

## 2023-09-05 DIAGNOSIS — I2510 Atherosclerotic heart disease of native coronary artery without angina pectoris: Secondary | ICD-10-CM | POA: Diagnosis not present

## 2023-09-05 DIAGNOSIS — I10 Essential (primary) hypertension: Secondary | ICD-10-CM | POA: Diagnosis not present

## 2023-09-05 MED ORDER — SODIUM CHLORIDE 0.9 % IV SOLN
500.0000 mL | Freq: Once | INTRAVENOUS | Status: DC
Start: 2023-09-05 — End: 2023-09-05

## 2023-09-05 NOTE — Progress Notes (Signed)
 1355 BP 192/92, Labetalol  given IV, MD update, vss

## 2023-09-05 NOTE — Progress Notes (Signed)
 1305 Robinul 0.1 mg IV given due large amount of secretions upon assessment.  MD made aware, vss

## 2023-09-05 NOTE — Progress Notes (Signed)
 Report given to PACU, vss

## 2023-09-05 NOTE — Patient Instructions (Addendum)
 Colonoscopy Continue present medications Resume previous diet Await pathology results Handouts provided on gastritis, polyps, diverticulosis and hemorrhoids.      YOU HAD AN ENDOSCOPIC PROCEDURE TODAY AT THE [facility]:   Refer to the procedure report that was given to you for any specific questions about what was found during the examination.  If the procedure report does not answer your questions, please call your gastroenterologist to clarify.  If you requested that your care partner not be given the details of your procedure findings, then the procedure report has been included in a sealed envelope for you to review at your convenience later.  YOU SHOULD EXPECT: Some feelings of bloating in the abdomen. Passage of more gas than usual.  Walking can help get rid of the air that was put into your GI tract during the procedure and reduce the bloating. If you had a lower endoscopy (such as a colonoscopy or flexible sigmoidoscopy) you may notice spotting of blood in your stool or on the toilet paper. If you underwent a bowel prep for your procedure, you may not have a normal bowel movement for a few days.  Please Note:  You might notice some irritation and congestion in your nose or some drainage.  This is from the oxygen used during your procedure.  There is no need for concern and it should clear up in a day or so.  SYMPTOMS TO REPORT IMMEDIATELY:  Following lower endoscopy (colonoscopy or flexible sigmoidoscopy):  Excessive amounts of blood in the stool  Significant tenderness or worsening of abdominal pains  Swelling of the abdomen that is new, acute  Fever of 100F or higher  Following upper endoscopy (EGD)  Vomiting of blood or coffee ground material  New chest pain or pain under the shoulder blades  Painful or persistently difficult swallowing  New shortness of breath  Fever of 100F or higher  Black, tarry-looking stools  For urgent or emergent issues, a  gastroenterologist can be reached at any hour by calling [phone]. Do not use MyChart messaging for urgent concerns.    DIET:  We do recommend a small meal at first, but then you may proceed to your regular diet.  Drink plenty of fluids but you should avoid alcoholic beverages for 24 hours.  ACTIVITY:  You should plan to take it easy for the rest of today and you should NOT DRIVE or use heavy machinery until tomorrow (because of the sedation medicines used during the test).    FOLLOW UP: Our staff will call the number listed on your records the next business day following your procedure.  We will call around 7:15- 8:00 am to check on you and address any questions or concerns that you may have regarding the information given to you following your procedure. If we do not reach you, we will leave a message.     If any biopsies were taken you will be contacted by phone or by letter within the next 1-3 weeks.  Please call us  at [phone] if you have not heard about the biopsies in 3 weeks.    SIGNATURES/CONFIDENTIALITY: You and/or your care partner have signed paperwork which will be entered into your electronic medical record.  These signatures attest to the fact that that the information above on your After Visit Summary has been reviewed and is understood.  Full responsibility of the confidentiality of this discharge information lies with you and/or your care-partner.

## 2023-09-05 NOTE — Op Note (Signed)
 [facility] Patient Name: Isaac Patel Procedure Date: 09/05/2023 1:36 PM MRN: [id number] Endoscopist: Ace Abu L. Dominic Friendly , MD, [id number] Age: 58 Referring MD:  Date of Birth: 1966/03/24 Gender: Male Account #: [id number] Procedure:                Upper GI endoscopy Indications:              Diarrhea, Weight loss Medicines:                Monitored Anesthesia Care Procedure:                Pre-Anesthesia Assessment:                           - Prior to the procedure, a History and Physical                            was performed, and patient medications and                            allergies were reviewed. The patient's tolerance of                            previous anesthesia was also reviewed. The risks                            and benefits of the procedure and the sedation                            options and risks were discussed with the patient.                            All questions were answered, and informed consent                            was obtained. Prior Anticoagulants: The patient has                            taken no anticoagulant or antiplatelet agents. ASA                            Grade Assessment: III - A patient with severe                            systemic disease. After reviewing the risks and                            benefits, the patient was deemed in satisfactory                            condition to undergo the procedure.                           After obtaining informed consent, the endoscope was  passed under direct vision. Throughout the                            procedure, the patient's blood pressure, pulse, and                            oxygen saturations were monitored continuously. The                            Olympus Scope [id number] was introduced through the                            mouth, and advanced to the second part of duodenum.                            The upper GI endoscopy was  accomplished without                            difficulty. The patient tolerated the procedure                            well. Scope In: Scope Out: Findings:                 The esophagus was normal.                           Patchy mild inflammation characterized by adherent                            blood and erythema was found in the gastric body                            and in the gastric antrum. Biopsies were taken with                            a cold forceps for histology. (Antrum and body jar                            to rule out H. pylori)                           The exam of the stomach was otherwise normal.                           The cardia and gastric fundus were normal on                            retroflexion.                           Normal mucosa was found in the entire duodenum.                            Biopsies for histology were taken with a  cold                            forceps for evaluation of celiac disease. Complications:            No immediate complications. Estimated Blood Loss:     Estimated blood loss was minimal. Impression:               - Normal esophagus.                           - Gastritis. Biopsied.                           - Normal mucosa was found in the entire examined                            duodenum. Biopsied. Recommendation:           - Patient has a contact number available for                            emergencies. The signs and symptoms of potential                            delayed complications were discussed with the                            patient. Return to normal activities tomorrow.                            Written discharge instructions were provided to the                            patient.                           - Resume previous diet.                           - Continue present medications.                           - Await pathology results.                           - See the other procedure  note for documentation of                            additional recommendations. Opie Maclaughlin L. Dominic Friendly, MD 09/05/2023 2:20:02 PM This report has been signed electronically.

## 2023-09-05 NOTE — Progress Notes (Signed)
 History and Physical:  This patient presents for endoscopic testing for: Encounter Diagnoses  Name Primary?   Altered bowel habits Yes   Gastroesophageal reflux disease with esophagitis without hemorrhage    Weight loss     58 year old man here today for endoscopic evaluation of digestive symptoms as outlined in my office consult note dated 07/25/2023 Subsequent fecal elastase was normal  Patient is otherwise without complaints or active issues today.   Past Medical History: Past Medical History:  Diagnosis Date   Aortic insufficiency    Status post aortic valve repair // Echo 8/20: EF 50-55, moderate LVH, grade 1 diastolic dysfunction, normal RV SF, status post AV repair, mild AI, mild dilation of aortic root (38 mm)   CAD (coronary artery disease)    a.  Non-STEMI 8/16: LHC-proximal LAD 80% treated with a resolute DES, EF 50-55%;  b.  Echo 8/16:  Moderate LVH, EF 55-60%, normal wall motion, normal diastolic function, normal RV function   CHF (congestive heart failure) (HCC)    Dilated aortic root (HCC)    38mm by echo 08/2020 and 41 mm by echo 08/2021 and 44 mm by chest CTA 09-2021   Dyspnea    mild   GERD (gastroesophageal reflux disease)    Headache    "maybe monthly" (10/28/2014)   Heart murmur    HLD (hyperlipidemia)    Hypertension    NSTEMI (non-ST elevated myocardial infarction) (HCC) 10/28/2014   OSA (obstructive sleep apnea)    "tried mask; wear it off and on" (10/28/2014)   S/P aortic valve repair 09/15/2018   Complex valvuloplasty including plication of prolapsing right coronary cusp with Biostable HAART 300 aortic annuloplasty, size 21 mm   Sleep apnea      Past Surgical History: Past Surgical History:  Procedure Laterality Date   AORTIC VALVE REPAIR N/A 09/15/2018   Procedure: AORTIC VALVE REPAIR USING HAART 300 AORTIC ANNULOPLASTY DEVICE SIZE ;  Surgeon: Gardenia Jump, MD;  Location: [facility] OR;  Service: Open Heart Surgery;  Laterality: N/A;   ARM AMPUTATION  THROUGH FOREARM Left 2009   traumatic injury   CARDIAC CATHETERIZATION N/A 10/29/2014   Procedure: Left Heart Cath and Coronary Angiography;  Surgeon: Millicent Ally, MD;  Location: [facility] INVASIVE CV LAB;  Service: Cardiovascular;  Laterality: N/A;   CHOLESTEATOMA EXCISION Right 1990's   LEFT HEART CATH AND CORONARY ANGIOGRAPHY N/A 05/25/2016   Procedure: Left Heart Cath and Coronary Angiography;  Surgeon: Arnoldo Lapping, MD;  Location: [facility] INVASIVE CV LAB;  Service: Cardiovascular;  Laterality: N/A;   MULTIPLE TOOTH EXTRACTIONS  08/29/2018   NASAL SEPTOPLASTY W/ TURBINOPLASTY Bilateral 11/30/2017   Procedure: NASAL SEPTOPLASTY WITH TURBINATE REDUCTION;  Surgeon: Ammon Bales, MD;  Location: [facility] OR;  Service: ENT;  Laterality: Bilateral;   RIGHT/LEFT HEART CATH AND CORONARY ANGIOGRAPHY N/A 08/11/2018   Procedure: RIGHT/LEFT HEART CATH AND CORONARY ANGIOGRAPHY;  Surgeon: Odie Benne, MD;  Location: [facility] INVASIVE CV LAB;  Service: Cardiovascular;  Laterality: N/A;   TEE WITHOUT CARDIOVERSION N/A 06/08/2018   Procedure: TRANSESOPHAGEAL ECHOCARDIOGRAM (TEE);  Surgeon: Sheryle Donning, MD;  Location: [facility] ENDOSCOPY;  Service: Cardiovascular;  Laterality: N/A;   TEE WITHOUT CARDIOVERSION N/A 09/15/2018   Procedure: TRANSESOPHAGEAL ECHOCARDIOGRAM (TEE);  Surgeon: Gardenia Jump, MD;  Location: [facility] OR;  Service: Open Heart Surgery;  Laterality: N/A;   TYMPANOSTOMY TUBE PLACEMENT Bilateral "as a kid"   "had one done as an adult too"   WISDOM TOOTH EXTRACTION      Allergies: Allergies  Allergen Reactions   Morphine  And Codeine  Itching    Outpatient Meds: Current Outpatient Medications  Medication Sig Dispense Refill   atorvastatin  (LIPITOR ) 80 MG tablet Take 1 tablet (80 mg total) by mouth daily. 90 tablet 3   calcium  carbonate (TUMS - DOSED IN MG ELEMENTAL CALCIUM ) 500 MG chewable tablet Chew 2 tablets by mouth daily as needed for indigestion or heartburn.     levocetirizine (XYZAL ) 5  MG tablet Take 1 tablet (5 mg total) by mouth every evening. 90 tablet 2   metoprolol  tartrate (LOPRESSOR ) 25 MG tablet Take 1 tablet (25 mg total) by mouth 2 (two) times daily. 180 tablet 3   Multiple Vitamin (MULTI-VITAMIN PO) Take 1 tablet by mouth daily.     pantoprazole  (PROTONIX ) 40 MG tablet Take 1 tablet (40 mg total) by mouth daily. 90 tablet 3   sucralfate  (CARAFATE ) 1 g tablet Take 1 tablet (1 g total) by mouth 4 (four) times daily. 120 tablet 0   aspirin  EC 81 MG tablet Take 81 mg by mouth daily.     fluticasone (FLONASE) 50 MCG/ACT nasal spray Place 1 spray into both nostrils daily as needed for allergies or rhinitis.     nitroGLYCERIN  (NITROSTAT ) 0.4 MG SL tablet Place 1 tablet (0.4 mg total) under the tongue every 5 (five) minutes x 3 doses as needed for chest pain. 25 tablet 3   Current Facility-Administered Medications  Medication Dose Route Frequency Provider Last Rate Last Admin   0.9 %  sodium chloride  infusion  500 mL Intravenous Once Danis, Syncere Eble L III, MD          ___________________________________________________________________ Objective   Exam:  BP (!) 152/80   Pulse (!) 56   Temp 98.3 F (36.8 C)   Resp 18   Ht 5\' 11"  (1.803 m)   Wt 188 lb (85.3 kg)   SpO2 96%   BMI 26.22 kg/m   CV: regular , S1/S2 Resp: clear to auscultation bilaterally, normal RR and effort noted GI: soft, no tenderness, with active bowel sounds.   Assessment: Encounter Diagnoses  Name Primary?   Altered bowel habits Yes   Gastroesophageal reflux disease with esophagitis without hemorrhage    Weight loss      Plan: Colonoscopy EGD  The benefits and risks of the planned procedure(s) were described in detail with the patient or (when appropriate) their health care proxy.  Risks were outlined as including, but not limited to, bleeding, infection, perforation, adverse medication reaction leading to cardiac or pulmonary decompensation, pancreatitis (if ERCP).  The limitation  of incomplete mucosal visualization was also discussed.  No guarantees or warranties were given.  The patient is appropriate for an endoscopic procedure in the ambulatory setting.   - Lorella Roles, MD

## 2023-09-05 NOTE — Progress Notes (Signed)
 1348 Severe OSA, nasopharyngeal airway size 7.0  placed without trauma. BP 214/117, Labetalol  given IV, MD update, vss

## 2023-09-05 NOTE — Op Note (Addendum)
 [facility] Patient Name: Isaac Patel Procedure Date: 09/05/2023 1:37 PM MRN: [id number] Endoscopist: Ace Abu L. Dominic Friendly , MD, [id number] Age: 58 Referring MD:  Date of Birth: [birth year] Gender: Male Account #: [id number] Procedure:                Colonoscopy Indications:              Change in bowel habits (chronic diarrhea), Weight                            loss (fluctuating)                           fecal elastase normal Medicines:                Monitored Anesthesia Care Procedure:                Pre-Anesthesia Assessment:                           - Prior to the procedure, a History and Physical                            was performed, and patient medications and                            allergies were reviewed. The patient's tolerance of                            previous anesthesia was also reviewed. The risks                            and benefits of the procedure and the sedation                            options and risks were discussed with the patient.                            All questions were answered, and informed consent                            was obtained. Prior Anticoagulants: The patient has                            taken no anticoagulant or antiplatelet agents. ASA                            Grade Assessment: III - A patient with severe                            systemic disease. After reviewing the risks and                            benefits, the patient was deemed in satisfactory  condition to undergo the procedure.                           After obtaining informed consent, the colonoscope                            was passed under direct vision. Throughout the                            procedure, the patient's blood pressure, pulse, and                            oxygen saturations were monitored continuously. The                            CF [id number] #[id number] was introduced through the anus                             and advanced to the the terminal ileum, with                            identification of the appendiceal orifice and IC                            valve. The colonoscopy was performed without                            difficulty. The patient tolerated the procedure                            well. The quality of the bowel preparation was                            good. The terminal ileum, ileocecal valve,                            appendiceal orifice, and rectum were photographed. Scope In: 1:39:41 PM Scope Out: 1:54:01 PM Scope Withdrawal Time: 0 hours 9 minutes 6 seconds  Total Procedure Duration: 0 hours 14 minutes 20 seconds  Findings:                 The perianal and digital rectal examinations were                            normal.                           The terminal ileum appeared normal.                           Normal mucosa was found in the entire colon.                            Biopsies for histology were taken with a cold  forceps from the right colon and left colon for                            evaluation of microscopic colitis.                           Multiple small-mouthed diverticula were found in                            the left colon.                           Internal hemorrhoids were found. The hemorrhoids                            were small.                           The exam was otherwise without abnormality on                            direct and retroflexion views. Complications:            No immediate complications. Estimated Blood Loss:     Estimated blood loss was minimal. Impression:               - The examined portion of the ileum was normal.                           - Normal mucosa in the entire examined colon.                            Biopsied.                           - Diverticulosis in the left colon.                           - Internal hemorrhoids.                           - The  examination was otherwise normal on direct                            and retroflexion views. Recommendation:           - Patient has a contact number available for                            emergencies. The signs and symptoms of potential                            delayed complications were discussed with the                            patient. Return to normal activities tomorrow.  Written discharge instructions were provided to the                            patient.                           - Resume previous diet.                           - Continue present medications.                           - Await pathology results.                           - Repeat colonoscopy is recommended for                            surveillance. The colonoscopy date will be                            determined after pathology results from today's                            exam become available for review.                           - See the other procedure note for documentation of                            additional recommendations. Darrell Hauk L. Dominic Friendly, MD 09/05/2023 2:16:55 PM This report has been signed electronically. Addendum Number: 1   Addendum Date: 09/13/2023 12:18:02 PM      A single diminutive sessile polyp in the descending colon was removed       with a cold snare. The polyp was completely removed and retrieved. Abbas Beyene L. Dominic Friendly, MD 09/13/2023 12:20:00 PM This report has been signed electronically.

## 2023-09-05 NOTE — Progress Notes (Signed)
 Pt's states no medical or surgical changes since previsit or office visit.

## 2023-09-06 ENCOUNTER — Telehealth: Payer: Self-pay

## 2023-09-06 NOTE — Telephone Encounter (Signed)
  Follow up Call-     09/05/2023   12:38 PM  Call back number  Post procedure Call Back phone  # [phone]  Permission to leave phone message Yes     Patient questions:  Do you have a fever, pain , or abdominal swelling? No. Pain Score  0 *  Have you tolerated food without any problems? Yes.    Have you been able to return to your normal activities? Yes.    Do you have any questions about your discharge instructions: Diet   No. Medications  No. Follow up visit  No.  Do you have questions or concerns about your Care? No.  Actions: * If pain score is 4 or above: No action needed, pain <4.

## 2023-09-08 LAB — SURGICAL PATHOLOGY

## 2023-09-10 ENCOUNTER — Ambulatory Visit: Payer: Self-pay | Admitting: Gastroenterology

## 2023-09-12 ENCOUNTER — Ambulatory Visit (HOSPITAL_COMMUNITY)
Admission: RE | Admit: 2023-09-12 | Discharge: 2023-09-12 | Disposition: A | Discharge status: Home / Self Care | Source: Ambulatory Visit | Attending: Cardiology | Admitting: Cardiology

## 2023-09-12 DIAGNOSIS — I5030 Unspecified diastolic (congestive) heart failure: Secondary | ICD-10-CM | POA: Diagnosis not present

## 2023-09-12 DIAGNOSIS — I517 Cardiomegaly: Secondary | ICD-10-CM

## 2023-09-12 DIAGNOSIS — Z9889 Other specified postprocedural states: Secondary | ICD-10-CM | POA: Insufficient documentation

## 2023-09-12 DIAGNOSIS — I088 Other rheumatic multiple valve diseases: Secondary | ICD-10-CM | POA: Diagnosis not present

## 2023-09-12 LAB — ECHOCARDIOGRAM COMPLETE
AV Mean grad: 9 mmHg
AV Peak grad: 18.3 mmHg
Ao pk vel: 2.14 m/s
Area-P 1/2: 4.17 cm2
S' Lateral: 3.69 cm

## 2023-09-14 ENCOUNTER — Ambulatory Visit: Payer: Self-pay | Admitting: Physician Assistant

## 2023-09-14 DIAGNOSIS — I2510 Atherosclerotic heart disease of native coronary artery without angina pectoris: Secondary | ICD-10-CM

## 2023-09-14 DIAGNOSIS — I429 Cardiomyopathy, unspecified: Secondary | ICD-10-CM

## 2023-09-14 DIAGNOSIS — I351 Nonrheumatic aortic (valve) insufficiency: Secondary | ICD-10-CM

## 2023-09-26 DIAGNOSIS — Z89212 Acquired absence of left upper limb below elbow: Secondary | ICD-10-CM | POA: Diagnosis not present

## 2023-09-26 DIAGNOSIS — Z7389 Other problems related to life management difficulty: Secondary | ICD-10-CM | POA: Diagnosis not present

## 2023-09-26 DIAGNOSIS — Z448 Encounter for fitting and adjustment of other external prosthetic devices: Secondary | ICD-10-CM | POA: Diagnosis not present

## 2023-09-28 ENCOUNTER — Other Ambulatory Visit (HOSPITAL_BASED_OUTPATIENT_CLINIC_OR_DEPARTMENT_OTHER): Payer: Self-pay

## 2023-10-10 ENCOUNTER — Encounter (HOSPITAL_COMMUNITY): Payer: Self-pay

## 2023-10-12 ENCOUNTER — Encounter (HOSPITAL_COMMUNITY)
Admission: RE | Admit: 2023-10-12 | Discharge: 2023-10-12 | Disposition: A | Discharge status: Home / Self Care | Source: Ambulatory Visit | Attending: Physician Assistant | Admitting: Physician Assistant

## 2023-10-12 ENCOUNTER — Ambulatory Visit: Payer: Self-pay | Admitting: Physician Assistant

## 2023-10-12 DIAGNOSIS — I2510 Atherosclerotic heart disease of native coronary artery without angina pectoris: Secondary | ICD-10-CM | POA: Insufficient documentation

## 2023-10-12 DIAGNOSIS — I429 Cardiomyopathy, unspecified: Secondary | ICD-10-CM | POA: Diagnosis not present

## 2023-10-12 LAB — NM PET CT CARDIAC PERFUSION MULTI W/ABSOLUTE BLOODFLOW
LV dias vol: 127 mL (ref 62–150)
LV sys vol: 60 mL (ref 4.2–5.8)
MBFR: 2.1
Nuc Rest EF: 53 %
Nuc Stress EF: 57 %
Peak HR: 104 {beats}/min
Rest HR: 80 {beats}/min
Rest MBF: 0.99 ml/g/min
Rest Nuclear Isotope Dose: 21.9 mCi
ST Depression (mm): 0 mm
Stress MBF: 2.08 ml/g/min
Stress Nuclear Isotope Dose: 22.4 mCi

## 2023-10-12 MED ORDER — REGADENOSON 0.4 MG/5ML IV SOLN
0.4000 mg | Freq: Once | INTRAVENOUS | Status: AC
  Administered 2023-10-12: 0.4 mg via INTRAVENOUS

## 2023-10-12 MED ORDER — REGADENOSON 0.4 MG/5ML IV SOLN
INTRAVENOUS | Status: AC
  Filled 2023-10-12: qty 5

## 2023-10-12 MED ORDER — RUBIDIUM RB82 GENERATOR (RUBYFILL)
21.9000 | PACK | Freq: Once | INTRAVENOUS | Status: AC
  Administered 2023-10-12: 21.9 via INTRAVENOUS

## 2023-10-12 MED ORDER — RUBIDIUM RB82 GENERATOR (RUBYFILL)
21.9000 | PACK | Freq: Once | INTRAVENOUS | Status: AC
  Administered 2023-10-12: 22.35 via INTRAVENOUS

## 2023-11-07 ENCOUNTER — Ambulatory Visit: Admitting: Physician Assistant

## 2023-11-21 NOTE — Progress Notes (Unsigned)
 Cardiology Office Note   Date:  11/23/2023  ID:  Marris Frontera, DOB 28-Jul-1965, MRN [id number] PCP: Norleen Lynwood ORN, MD  [facility] Providers Cardiologist:  Wilbert Bihari, MD    History of Present Illness Isaac Patel is a 58 y.o. male with past medical history of coronary artery disease, aortic insufficiency, hypertension, hyperlipidemia, OSA, cervical degenerative disc disease, and tobacco use who is here for follow-up appointment.  He was seen in follow-up October 2024 after an emergency department visit in September with symptoms of feeling jittery and chest tightness.  Patient had noted elevated blood pressure at home and this prompted the visit.  EKG in the ED showed no evidence of acute ischemia or arrhythmia, 2 high-sensitivity troponins were negative.  Since emergency department visit the patient has not experienced a recurrence of the jittery feeling or chest tightness.  Had been monitoring his blood pressure daily at work since he has access to professional blood pressure machine and the readings have been consistently around 120s over 80s.  Patient had not noticed any changes in his exertional tolerance or SOB.  He continues to engage in yard work without experiencing chest tightness/pain.  The patient also reported some recent family stress related to his wife's father having a stroke, wondered if this contributed to the episode of high blood pressure.  Considering replacing his home blood pressure machine which is about 59 years old due to concerns about its reliability.  Since his ED visit, patient denied chest pain, SOB, lower extremity edema, fatigue, palpitations, melena, hematuria, hemoptysis, diaphoresis, weakness, presyncope, syncope, orthopnea, and PND.  Today, he presents with coronary artery disease and hypertension for a cardiovascular follow-up.  He has no recent chest pain or shortness of breath and feels stable from a cardiovascular perspective. An  echocardiogram showed a slight drop in ejection fraction, but a subsequent nuclear study was normal. 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 Cardiac PET/CT reviewed which was a low risk study and did not show any perfusion issues.   He has aortic insufficiency, repaired with a cannuloplasty in June 2020, and undergoes regular echocardiograms to monitor the condition. He reports no symptoms related to his valve condition.  He has a history of a dilated aorta, but recent imaging showed normal size and structure. His last CT scan in 2020 showed a normal aorta.  His blood pressure is well-controlled at 116/72. Recent lab work showed a total cholesterol of 146, LDL of 71, and an A1c of 6.1.  He stays active, is on his feet a lot at work, and does various activities around the house. He is considering incorporating more walking into his routine.  Reports no shortness of breath nor dyspnea on exertion. Reports no chest pain, pressure, or tightness. No edema, orthopnea, PND. Reports no palpitations.   Discussed the use of AI scribe software for clinical note transcription with the patient, who gave verbal consent to proceed.  ROS: pertinent ROS in HPI  Studies Reviewed      09/06/22 TTE   IMPRESSIONS     1. Left ventricular ejection fraction, by estimation, is 55 to 60%. The  left ventricle has normal function. The left ventricle has no regional  wall motion abnormalities. There is mild concentric left ventricular  hypertrophy. Left ventricular diastolic  parameters were normal.   2. Right ventricular systolic function is normal. The right ventricular  size is normal.   3. The mitral valve is normal in structure. No evidence of  mitral valve  regurgitation. No evidence of mitral stenosis.   4. The aortic valve has been repaired/replaced (annuloplasty and RCC  repair). Aortic valve regurgitation is mild to moderate. No aortic  stenosis is  present. There is a valve present in the aortic position.  Aortic regurgitation PHT measures 598 msec.  Aortic valve mean gradient measures 15.0 mmHg.   5. The inferior vena cava is normal in size with greater than 50%  respiratory variability, suggesting right atrial pressure of 3 mmHg.   FINDINGS   Left Ventricle: Left ventricular ejection fraction, by estimation, is 55  to 60%. The left ventricle has normal function. The left ventricle has no  regional wall motion abnormalities. The left ventricular internal cavity  size was normal in size. There is   mild concentric left ventricular hypertrophy. Left ventricular diastolic  parameters were normal.   Right Ventricle: The right ventricular size is normal. No increase in  right ventricular wall thickness. Right ventricular systolic function is  normal.   Left Atrium: Left atrial size was normal in size.   Right Atrium: Right atrial size was normal in size.   Pericardium: There is no evidence of pericardial effusion.   Mitral Valve: The mitral valve is normal in structure. No evidence of  mitral valve regurgitation. No evidence of mitral valve stenosis.   Tricuspid Valve: The tricuspid valve is normal in structure. Tricuspid  valve regurgitation is not demonstrated. No evidence of tricuspid  stenosis.   Aortic Valve: The aortic valve has been repaired/replaced. Aortic valve  regurgitation is mild to moderate. Aortic regurgitation PHT measures 598  msec. No aortic stenosis is present. Aortic valve mean gradient measures  15.0 mmHg. Aortic valve peak  gradient measures 27.2 mmHg. There is a valve present in the aortic  position.   Pulmonic Valve: The pulmonic valve was normal in structure. Pulmonic valve  regurgitation is mild. No evidence of pulmonic stenosis.   Aorta: The aortic root is normal in size and structure.   Venous: The inferior vena cava is normal in size with greater than 50%  respiratory variability, suggesting  right atrial pressure of 3 mmHg.   IAS/Shunts: No atrial level shunt detected by color flow Doppler.       Physical Exam VS:  BP 116/72   Pulse 75   Ht 5' 11 (1.803 m)   Wt 196 lb 12.8 oz (89.3 kg)   SpO2 95%   BMI 27.45 kg/m        Wt Readings from Last 3 Encounters:  11/23/23 196 lb 12.8 oz (89.3 kg)  09/05/23 188 lb (85.3 kg)  07/25/23 188 lb 8 oz (85.5 kg)    GEN: Well nourished, well developed in no acute distress NECK: No JVD; No carotid bruits CARDIAC: RRR, no murmurs, rubs, gallops RESPIRATORY:  Clear to auscultation without rales, wheezing or rhonchi  ABDOMEN: Soft, non-tender, non-distended EXTREMITIES:  No edema; No deformity   ASSESSMENT AND PLAN  Coronary artery disease No recent chest pain or dyspnea. Recent nuclear study indicated low risk and normal findings. Jaw tightness reported but not cardiac related.  Aortic valve insufficiency, status post repair Status post aortic valve repair with mild to moderate leak. Asymptomatic. - Continue annual echocardiograms to monitor valve leak. -no issues since surgery in 2020  Hypertension Blood pressure well-controlled at 116/72 mmHg. - Continue current antihypertensive medications.  Aortic dilation, stable Previous echocardiogram showed normal aortic size and structure. Past dilation minimal and likely related to valve issues. Echo from  2023 showed slight increase from 38 to 41 mm - Monitor blood pressure to maintain it under 120 mmHg.  Hyperlipidemia Cholesterol levels well-controlled with total cholesterol at 146 mg/dL and LDL at 71 mg/dL. - Follow up with lab work at next visit with primary care provider.      Dispo: He can see Glendia Ferrier, PA-C in a year  Signed, Orren LOISE Fabry, PA-C

## 2023-11-23 ENCOUNTER — Ambulatory Visit: Attending: Physician Assistant | Admitting: Physician Assistant

## 2023-11-23 ENCOUNTER — Encounter: Payer: Self-pay | Admitting: Physician Assistant

## 2023-11-23 ENCOUNTER — Other Ambulatory Visit (HOSPITAL_BASED_OUTPATIENT_CLINIC_OR_DEPARTMENT_OTHER): Payer: Self-pay

## 2023-11-23 VITALS — BP 116/72 | HR 75 | Ht 71.0 in | Wt 196.8 lb

## 2023-11-23 DIAGNOSIS — I10 Essential (primary) hypertension: Secondary | ICD-10-CM

## 2023-11-23 DIAGNOSIS — Z9889 Other specified postprocedural states: Secondary | ICD-10-CM

## 2023-11-23 DIAGNOSIS — I77810 Thoracic aortic ectasia: Secondary | ICD-10-CM | POA: Diagnosis not present

## 2023-11-23 DIAGNOSIS — I429 Cardiomyopathy, unspecified: Secondary | ICD-10-CM | POA: Diagnosis not present

## 2023-11-23 DIAGNOSIS — E7800 Pure hypercholesterolemia, unspecified: Secondary | ICD-10-CM | POA: Diagnosis not present

## 2023-11-23 DIAGNOSIS — I351 Nonrheumatic aortic (valve) insufficiency: Secondary | ICD-10-CM

## 2023-11-23 DIAGNOSIS — I2510 Atherosclerotic heart disease of native coronary artery without angina pectoris: Secondary | ICD-10-CM

## 2023-11-23 MED ORDER — METOPROLOL TARTRATE 25 MG PO TABS
25.0000 mg | ORAL_TABLET | Freq: Two times a day (BID) | ORAL | 3 refills | Status: AC
  Filled 2023-11-23: qty 180, 90d supply, fill #0
  Filled 2024-03-17: qty 180, 90d supply, fill #1

## 2023-11-23 MED ORDER — NITROGLYCERIN 0.4 MG SL SUBL
0.4000 mg | SUBLINGUAL_TABLET | SUBLINGUAL | 5 refills | Status: AC | PRN
  Filled 2023-11-23: qty 25, 1d supply, fill #0

## 2023-11-23 MED ORDER — ATORVASTATIN CALCIUM 80 MG PO TABS
80.0000 mg | ORAL_TABLET | Freq: Every day | ORAL | 3 refills | Status: AC
  Filled 2023-11-23: qty 90, 90d supply, fill #0

## 2023-11-23 NOTE — Patient Instructions (Signed)
 Medication Instructions:  Your physician recommends that you continue on your current medications as directed. Please refer to the Current Medication list given to you today.  *If you need a refill on your cardiac medications before your next appointment, please call your pharmacy*  Lab Work: NONE If you have labs (blood work) drawn today and your tests are completely normal, you will receive your results only by: MyChart Message (if you have MyChart) OR A paper copy in the mail If you have any lab test that is abnormal or we need to change your treatment, we will call you to review the results.  Testing/Procedures: NONE  Follow-Up: At [facility], you and your health needs are our priority.  As part of our continuing mission to provide you with exceptional heart care, our providers are all part of one team.  This team includes your primary Cardiologist (physician) and Advanced Practice Providers or APPs (Physician Assistants and Nurse Practitioners) who all work together to provide you with the care you need, when you need it.  Your next appointment:   1 year(s)  Provider:   GLENDIA FERRIER, PA-C  We recommend signing up for the patient portal called MyChart.  Sign up information is provided on this After Visit Summary.  MyChart is used to connect with patients for Virtual Visits (Telemedicine).  Patients are able to view lab/test results, encounter notes, upcoming appointments, etc.  Non-urgent messages can be sent to your provider as well.   To learn more about what you can do with MyChart, go to [email or web address].

## 2024-02-01 ENCOUNTER — Encounter: Payer: Self-pay | Admitting: Internal Medicine

## 2024-03-26 ENCOUNTER — Other Ambulatory Visit (HOSPITAL_BASED_OUTPATIENT_CLINIC_OR_DEPARTMENT_OTHER): Payer: Self-pay

## 2024-05-21 ENCOUNTER — Encounter: Payer: Commercial Managed Care - PPO | Admitting: Internal Medicine
